# Patient Record
Sex: Male | Born: 1955 | Race: Black or African American | Hispanic: No | Smoking: Former smoker
Health system: Southern US, Community
[De-identification: ages and names within clinical notes are randomized; demographics above are authoritative.]

## PROBLEM LIST (undated history)

## (undated) DIAGNOSIS — M1990 Unspecified osteoarthritis, unspecified site: Secondary | ICD-10-CM

## (undated) DIAGNOSIS — Z22322 Carrier or suspected carrier of Methicillin resistant Staphylococcus aureus: Secondary | ICD-10-CM

## (undated) DIAGNOSIS — E785 Hyperlipidemia, unspecified: Secondary | ICD-10-CM

## (undated) DIAGNOSIS — Z8489 Family history of other specified conditions: Secondary | ICD-10-CM

## (undated) DIAGNOSIS — E215 Disorder of parathyroid gland, unspecified: Secondary | ICD-10-CM

## (undated) DIAGNOSIS — D649 Anemia, unspecified: Secondary | ICD-10-CM

## (undated) DIAGNOSIS — M109 Gout, unspecified: Secondary | ICD-10-CM

## (undated) DIAGNOSIS — Z720 Tobacco use: Secondary | ICD-10-CM

## (undated) DIAGNOSIS — F1411 Cocaine abuse, in remission: Secondary | ICD-10-CM

## (undated) DIAGNOSIS — I10 Essential (primary) hypertension: Secondary | ICD-10-CM

## (undated) DIAGNOSIS — E790 Hyperuricemia without signs of inflammatory arthritis and tophaceous disease: Secondary | ICD-10-CM

## (undated) DIAGNOSIS — A159 Respiratory tuberculosis unspecified: Secondary | ICD-10-CM

## (undated) DIAGNOSIS — N189 Chronic kidney disease, unspecified: Secondary | ICD-10-CM

## (undated) DIAGNOSIS — N186 End stage renal disease: Secondary | ICD-10-CM

## (undated) HISTORY — DX: Hyperuricemia without signs of inflammatory arthritis and tophaceous disease: E79.0

## (undated) HISTORY — DX: Hyperlipidemia, unspecified: E78.5

## (undated) HISTORY — DX: Cocaine abuse, in remission: F14.11

## (undated) HISTORY — DX: End stage renal disease: N18.6

## (undated) HISTORY — DX: Tobacco use: Z72.0

## (undated) HISTORY — PX: MANDIBLE FRACTURE SURGERY: SHX706

## (undated) HISTORY — DX: Carrier or suspected carrier of methicillin resistant Staphylococcus aureus: Z22.322

## (undated) HISTORY — DX: Anemia, unspecified: D64.9

---

## 1978-01-08 HISTORY — PX: APPENDECTOMY: SHX54

## 1983-01-09 DIAGNOSIS — A159 Respiratory tuberculosis unspecified: Secondary | ICD-10-CM

## 1983-01-09 HISTORY — DX: Respiratory tuberculosis unspecified: A15.9

## 2003-07-07 ENCOUNTER — Inpatient Hospital Stay (HOSPITAL_COMMUNITY): Admission: EM | Admit: 2003-07-07 | Discharge: 2003-07-08 | Payer: Self-pay

## 2003-12-15 ENCOUNTER — Ambulatory Visit: Payer: Self-pay | Admitting: Internal Medicine

## 2003-12-15 ENCOUNTER — Ambulatory Visit: Payer: Self-pay | Admitting: *Deleted

## 2003-12-30 ENCOUNTER — Ambulatory Visit: Payer: Self-pay | Admitting: Internal Medicine

## 2004-01-06 ENCOUNTER — Ambulatory Visit: Payer: Self-pay | Admitting: Internal Medicine

## 2004-04-06 ENCOUNTER — Ambulatory Visit: Payer: Self-pay | Admitting: Internal Medicine

## 2004-07-14 ENCOUNTER — Ambulatory Visit: Payer: Self-pay | Admitting: Internal Medicine

## 2004-07-20 ENCOUNTER — Ambulatory Visit: Payer: Self-pay | Admitting: Internal Medicine

## 2004-07-28 ENCOUNTER — Ambulatory Visit (HOSPITAL_COMMUNITY): Admission: RE | Admit: 2004-07-28 | Discharge: 2004-07-28 | Payer: Self-pay | Admitting: Internal Medicine

## 2004-08-02 ENCOUNTER — Ambulatory Visit: Payer: Self-pay | Admitting: Internal Medicine

## 2004-08-04 ENCOUNTER — Ambulatory Visit: Payer: Self-pay | Admitting: Internal Medicine

## 2004-08-16 ENCOUNTER — Ambulatory Visit: Payer: Self-pay | Admitting: Internal Medicine

## 2004-10-06 ENCOUNTER — Ambulatory Visit: Payer: Self-pay | Admitting: Internal Medicine

## 2004-10-09 ENCOUNTER — Ambulatory Visit: Payer: Self-pay | Admitting: Internal Medicine

## 2005-01-12 ENCOUNTER — Ambulatory Visit: Payer: Self-pay | Admitting: Internal Medicine

## 2005-02-09 ENCOUNTER — Ambulatory Visit: Payer: Self-pay | Admitting: Internal Medicine

## 2005-02-26 ENCOUNTER — Ambulatory Visit: Payer: Self-pay | Admitting: Internal Medicine

## 2005-02-27 ENCOUNTER — Ambulatory Visit: Payer: Self-pay | Admitting: Internal Medicine

## 2005-03-26 ENCOUNTER — Ambulatory Visit: Payer: Self-pay | Admitting: Internal Medicine

## 2005-04-17 ENCOUNTER — Ambulatory Visit: Payer: Self-pay | Admitting: Internal Medicine

## 2005-08-02 ENCOUNTER — Ambulatory Visit: Payer: Self-pay | Admitting: Internal Medicine

## 2005-09-27 ENCOUNTER — Ambulatory Visit: Payer: Self-pay | Admitting: Internal Medicine

## 2006-02-21 ENCOUNTER — Ambulatory Visit: Payer: Self-pay | Admitting: Internal Medicine

## 2006-02-22 ENCOUNTER — Encounter (INDEPENDENT_AMBULATORY_CARE_PROVIDER_SITE_OTHER): Payer: Self-pay | Admitting: Internal Medicine

## 2006-02-22 LAB — CONVERTED CEMR LAB: Microalbumin U total vol: 75.9 mg/L

## 2006-05-27 ENCOUNTER — Ambulatory Visit: Payer: Self-pay | Admitting: Internal Medicine

## 2006-08-14 ENCOUNTER — Ambulatory Visit: Payer: Self-pay | Admitting: Internal Medicine

## 2006-08-14 LAB — CONVERTED CEMR LAB
ALT: 28 units/L (ref 0–53)
AST: 20 units/L (ref 0–37)
Albumin: 4.3 g/dL (ref 3.5–5.2)
Alkaline Phosphatase: 59 units/L (ref 39–117)
BUN: 47 mg/dL — ABNORMAL HIGH (ref 6–23)
Basophils Absolute: 0 10*3/uL (ref 0.0–0.1)
Basophils Relative: 0 % (ref 0–1)
CO2: 21 meq/L (ref 19–32)
Calcium, Total (PTH): 8.7 mg/dL (ref 8.4–10.5)
Calcium: 8.7 mg/dL (ref 8.4–10.5)
Chloride: 103 meq/L (ref 96–112)
Creatinine, Ser: 3.37 mg/dL — ABNORMAL HIGH (ref 0.40–1.50)
Eosinophils Absolute: 0.1 10*3/uL (ref 0.0–0.7)
Eosinophils Relative: 2 % (ref 0–5)
Ferritin: 292 ng/mL (ref 22–322)
Glucose, Bld: 105 mg/dL — ABNORMAL HIGH (ref 70–99)
HCT: 44.9 % (ref 39.0–52.0)
Hemoglobin: 14.8 g/dL (ref 13.0–17.0)
Iron: 95 ug/dL (ref 42–165)
Lymphocytes Relative: 33 % (ref 12–46)
Lymphs Abs: 1.8 10*3/uL (ref 0.7–3.3)
MCHC: 33 g/dL (ref 30.0–36.0)
MCV: 91.3 fL (ref 78.0–100.0)
Monocytes Absolute: 0.5 10*3/uL (ref 0.2–0.7)
Monocytes Relative: 9 % (ref 3–11)
Neutro Abs: 3 10*3/uL (ref 1.7–7.7)
Neutrophils Relative %: 56 % (ref 43–77)
PTH: 261.4 pg/mL — ABNORMAL HIGH (ref 14.0–72.0)
Phosphorus: 4.8 mg/dL — ABNORMAL HIGH (ref 2.3–4.6)
Platelets: 209 10*3/uL (ref 150–400)
Potassium: 4.2 meq/L (ref 3.5–5.3)
RBC: 4.92 M/uL (ref 4.22–5.81)
RDW: 15 % — ABNORMAL HIGH (ref 11.5–14.0)
Saturation Ratios: 29 % (ref 20–55)
Sodium: 139 meq/L (ref 135–145)
TIBC: 330 ug/dL (ref 215–435)
Total Bilirubin: 0.3 mg/dL (ref 0.3–1.2)
Total Protein: 8.2 g/dL (ref 6.0–8.3)
UIBC: 235 ug/dL
WBC: 5.3 10*3/uL (ref 4.0–10.5)

## 2006-08-19 ENCOUNTER — Encounter (INDEPENDENT_AMBULATORY_CARE_PROVIDER_SITE_OTHER): Payer: Self-pay | Admitting: Internal Medicine

## 2006-08-19 DIAGNOSIS — Z9119 Patient's noncompliance with other medical treatment and regimen: Secondary | ICD-10-CM

## 2006-08-19 DIAGNOSIS — F1021 Alcohol dependence, in remission: Secondary | ICD-10-CM

## 2006-08-19 DIAGNOSIS — N2581 Secondary hyperparathyroidism of renal origin: Secondary | ICD-10-CM | POA: Insufficient documentation

## 2006-08-19 DIAGNOSIS — Z9189 Other specified personal risk factors, not elsewhere classified: Secondary | ICD-10-CM | POA: Insufficient documentation

## 2006-08-19 DIAGNOSIS — N259 Disorder resulting from impaired renal tubular function, unspecified: Secondary | ICD-10-CM | POA: Insufficient documentation

## 2006-08-19 DIAGNOSIS — I10 Essential (primary) hypertension: Secondary | ICD-10-CM

## 2006-09-25 ENCOUNTER — Encounter (INDEPENDENT_AMBULATORY_CARE_PROVIDER_SITE_OTHER): Payer: Self-pay | Admitting: *Deleted

## 2006-10-01 ENCOUNTER — Ambulatory Visit: Payer: Self-pay | Admitting: Internal Medicine

## 2006-10-01 LAB — CONVERTED CEMR LAB
Albumin: 4.1 g/dL (ref 3.5–5.2)
BUN: 45 mg/dL — ABNORMAL HIGH (ref 6–23)
CO2: 24 meq/L (ref 19–32)
Calcium, Total (PTH): 9.4 mg/dL (ref 8.4–10.5)
Calcium: 9.4 mg/dL (ref 8.4–10.5)
Chloride: 102 meq/L (ref 96–112)
Creatinine, Ser: 2.92 mg/dL — ABNORMAL HIGH (ref 0.40–1.50)
Glucose, Bld: 107 mg/dL — ABNORMAL HIGH (ref 70–99)
PTH: 52.6 pg/mL (ref 14.0–72.0)
Phosphorus: 4.4 mg/dL (ref 2.3–4.6)
Potassium: 4.4 meq/L (ref 3.5–5.3)
Sodium: 138 meq/L (ref 135–145)

## 2007-06-04 ENCOUNTER — Emergency Department (HOSPITAL_COMMUNITY): Admission: EM | Admit: 2007-06-04 | Discharge: 2007-06-04 | Payer: Self-pay | Admitting: Family Medicine

## 2007-06-06 ENCOUNTER — Ambulatory Visit: Payer: Self-pay | Admitting: Internal Medicine

## 2007-06-06 LAB — CONVERTED CEMR LAB
ALT: 21 units/L (ref 0–53)
AST: 18 units/L (ref 0–37)
Albumin: 3.6 g/dL (ref 3.5–5.2)
Alkaline Phosphatase: 62 units/L (ref 39–117)
BUN: 52 mg/dL — ABNORMAL HIGH (ref 6–23)
Basophils Absolute: 0 10*3/uL (ref 0.0–0.1)
Basophils Relative: 1 % (ref 0–1)
CO2: 21 meq/L (ref 19–32)
Calcium, Total (PTH): 8.6 mg/dL (ref 8.4–10.5)
Calcium: 8.6 mg/dL (ref 8.4–10.5)
Chloride: 106 meq/L (ref 96–112)
Creatinine, Ser: 2.98 mg/dL — ABNORMAL HIGH (ref 0.40–1.50)
Eosinophils Absolute: 0.1 10*3/uL (ref 0.0–0.7)
Eosinophils Relative: 2 % (ref 0–5)
Glucose, Bld: 111 mg/dL — ABNORMAL HIGH (ref 70–99)
HCT: 37.8 % — ABNORMAL LOW (ref 39.0–52.0)
Hemoglobin: 12 g/dL — ABNORMAL LOW (ref 13.0–17.0)
Lymphocytes Relative: 27 % (ref 12–46)
Lymphs Abs: 1.5 10*3/uL (ref 0.7–4.0)
MCHC: 31.7 g/dL (ref 30.0–36.0)
MCV: 93.6 fL (ref 78.0–100.0)
Monocytes Absolute: 0.4 10*3/uL (ref 0.1–1.0)
Monocytes Relative: 8 % (ref 3–12)
Neutro Abs: 3.5 10*3/uL (ref 1.7–7.7)
Neutrophils Relative %: 63 % (ref 43–77)
PTH: 48.3 pg/mL (ref 14.0–72.0)
Platelets: 314 10*3/uL (ref 150–400)
Potassium: 4.6 meq/L (ref 3.5–5.3)
RBC: 4.04 M/uL — ABNORMAL LOW (ref 4.22–5.81)
RDW: 14.9 % (ref 11.5–15.5)
Sodium: 139 meq/L (ref 135–145)
Total Bilirubin: 0.2 mg/dL — ABNORMAL LOW (ref 0.3–1.2)
Total Protein: 7.6 g/dL (ref 6.0–8.3)
WBC: 5.5 10*3/uL (ref 4.0–10.5)

## 2007-12-03 ENCOUNTER — Ambulatory Visit: Payer: Self-pay | Admitting: Internal Medicine

## 2007-12-03 LAB — CONVERTED CEMR LAB
ALT: 26 units/L (ref 0–53)
AST: 17 units/L (ref 0–37)
Albumin: 4.1 g/dL (ref 3.5–5.2)
Alkaline Phosphatase: 52 units/L (ref 39–117)
BUN: 56 mg/dL — ABNORMAL HIGH (ref 6–23)
Basophils Absolute: 0 10*3/uL (ref 0.0–0.1)
Basophils Relative: 0 % (ref 0–1)
CO2: 19 meq/L (ref 19–32)
Calcium: 8.6 mg/dL (ref 8.4–10.5)
Chloride: 107 meq/L (ref 96–112)
Cholesterol: 183 mg/dL (ref 0–200)
Creatinine, Ser: 3.76 mg/dL — ABNORMAL HIGH (ref 0.40–1.50)
Eosinophils Absolute: 0.3 10*3/uL (ref 0.0–0.7)
Eosinophils Relative: 4 % (ref 0–5)
Glucose, Bld: 87 mg/dL (ref 70–99)
HCT: 37.3 % — ABNORMAL LOW (ref 39.0–52.0)
HDL: 49 mg/dL (ref >39)
Hemoglobin: 12.2 g/dL — ABNORMAL LOW (ref 13.0–17.0)
Lymphocytes Relative: 23 % (ref 12–46)
Lymphs Abs: 1.3 10*3/uL (ref 0.7–4.0)
MCHC: 32.7 g/dL (ref 30.0–36.0)
MCV: 89.9 fL (ref 78.0–100.0)
Monocytes Absolute: 0.5 10*3/uL (ref 0.1–1.0)
Monocytes Relative: 8 % (ref 3–12)
Neutro Abs: 3.8 10*3/uL (ref 1.7–7.7)
Neutrophils Relative %: 65 % (ref 43–77)
Platelets: 210 10*3/uL (ref 150–400)
Potassium: 5 meq/L (ref 3.5–5.3)
RBC: 4.15 M/uL — ABNORMAL LOW (ref 4.22–5.81)
RDW: 13.5 % (ref 11.5–15.5)
Sodium: 136 meq/L (ref 135–145)
TSH: 0.757 microintl units/mL (ref 0.350–4.50)
Total Bilirubin: 0.2 mg/dL — ABNORMAL LOW (ref 0.3–1.2)
Total CHOL/HDL Ratio: 3.7
Total Protein: 7.3 g/dL (ref 6.0–8.3)
Triglycerides: 543 mg/dL — ABNORMAL HIGH (ref <150)
WBC: 6 10*3/uL (ref 4.0–10.5)

## 2008-01-09 HISTORY — PX: AV FISTULA PLACEMENT: SHX1204

## 2008-09-14 ENCOUNTER — Ambulatory Visit: Payer: Self-pay | Admitting: Internal Medicine

## 2008-09-14 LAB — CONVERTED CEMR LAB: Microalb, Ur: 144.68 mg/dL — ABNORMAL HIGH (ref 0.00–1.89)

## 2008-09-15 ENCOUNTER — Encounter (INDEPENDENT_AMBULATORY_CARE_PROVIDER_SITE_OTHER): Payer: Self-pay | Admitting: Internal Medicine

## 2008-09-15 LAB — CONVERTED CEMR LAB
ALT: 22 units/L (ref 0–53)
AST: 24 units/L (ref 0–37)
Albumin: 3.8 g/dL (ref 3.5–5.2)
Alkaline Phosphatase: 66 units/L (ref 39–117)
BUN: 72 mg/dL — ABNORMAL HIGH (ref 6–23)
Basophils Absolute: 0 10*3/uL (ref 0.0–0.1)
Basophils Relative: 1 % (ref 0–1)
CO2: 14 meq/L — ABNORMAL LOW (ref 19–32)
Calcium, Total (PTH): 8.2 mg/dL — ABNORMAL LOW (ref 8.4–10.5)
Calcium: 8.2 mg/dL — ABNORMAL LOW (ref 8.4–10.5)
Chloride: 109 meq/L (ref 96–112)
Creatinine, Ser: 5.76 mg/dL — ABNORMAL HIGH (ref 0.40–1.50)
Eosinophils Absolute: 0.2 10*3/uL (ref 0.0–0.7)
Eosinophils Relative: 3 % (ref 0–5)
Glucose, Bld: 88 mg/dL (ref 70–99)
HCT: 39.1 % (ref 39.0–52.0)
Hemoglobin: 13 g/dL (ref 13.0–17.0)
Lymphocytes Relative: 24 % (ref 12–46)
Lymphs Abs: 1.6 10*3/uL (ref 0.7–4.0)
MCHC: 33.2 g/dL (ref 30.0–36.0)
MCV: 92 fL (ref 78.0–100.0)
Monocytes Absolute: 0.6 10*3/uL (ref 0.1–1.0)
Monocytes Relative: 9 % (ref 3–12)
Neutro Abs: 4.1 10*3/uL (ref 1.7–7.7)
Neutrophils Relative %: 64 % (ref 43–77)
PTH: 388.7 pg/mL — ABNORMAL HIGH (ref 14.0–72.0)
Phosphorus: 5.4 mg/dL — ABNORMAL HIGH (ref 2.3–4.6)
Platelets: 204 10*3/uL (ref 150–400)
Potassium: 4.9 meq/L (ref 3.5–5.3)
RBC: 4.25 M/uL (ref 4.22–5.81)
RDW: 14.1 % (ref 11.5–15.5)
Sodium: 139 meq/L (ref 135–145)
Total Bilirubin: 0.2 mg/dL — ABNORMAL LOW (ref 0.3–1.2)
Total Protein: 7.3 g/dL (ref 6.0–8.3)
WBC: 6.5 10*3/uL (ref 4.0–10.5)

## 2008-11-09 ENCOUNTER — Ambulatory Visit: Payer: Self-pay | Admitting: Vascular Surgery

## 2008-11-17 ENCOUNTER — Ambulatory Visit (HOSPITAL_COMMUNITY): Admission: RE | Admit: 2008-11-17 | Discharge: 2008-11-17 | Payer: Self-pay | Admitting: Vascular Surgery

## 2008-11-17 ENCOUNTER — Ambulatory Visit: Payer: Self-pay | Admitting: Vascular Surgery

## 2008-12-01 ENCOUNTER — Ambulatory Visit: Payer: Self-pay | Admitting: Internal Medicine

## 2008-12-27 ENCOUNTER — Ambulatory Visit: Payer: Self-pay | Admitting: Vascular Surgery

## 2008-12-28 ENCOUNTER — Ambulatory Visit: Payer: Self-pay | Admitting: Internal Medicine

## 2008-12-28 ENCOUNTER — Ambulatory Visit: Payer: Self-pay | Admitting: Vascular Surgery

## 2009-02-03 ENCOUNTER — Ambulatory Visit: Payer: Self-pay | Admitting: Internal Medicine

## 2009-03-10 ENCOUNTER — Ambulatory Visit: Payer: Self-pay | Admitting: Internal Medicine

## 2009-03-11 ENCOUNTER — Ambulatory Visit: Payer: Self-pay | Admitting: Internal Medicine

## 2009-05-05 ENCOUNTER — Ambulatory Visit: Payer: Self-pay | Admitting: Internal Medicine

## 2009-05-05 LAB — CONVERTED CEMR LAB
ALT: 16 units/L (ref 0–53)
AST: 17 units/L (ref 0–37)
Albumin: 4.4 g/dL (ref 3.5–5.2)
Alkaline Phosphatase: 41 units/L (ref 39–117)
BUN: 96 mg/dL — ABNORMAL HIGH (ref 6–23)
Basophils Absolute: 0 10*3/uL (ref 0.0–0.1)
Basophils Relative: 1 % (ref 0–1)
CO2: 21 meq/L (ref 19–32)
Calcium: 11.6 mg/dL — ABNORMAL HIGH (ref 8.4–10.5)
Chloride: 98 meq/L (ref 96–112)
Cholesterol: 174 mg/dL (ref 0–200)
Creatinine, Ser: 6.78 mg/dL — ABNORMAL HIGH (ref 0.40–1.50)
Eosinophils Absolute: 0.2 10*3/uL (ref 0.0–0.7)
Eosinophils Relative: 3 % (ref 0–5)
Glucose, Bld: 103 mg/dL — ABNORMAL HIGH (ref 70–99)
HCT: 39 % (ref 39.0–52.0)
HDL: 57 mg/dL (ref >39)
Hemoglobin: 13 g/dL (ref 13.0–17.0)
LDL Cholesterol: 74 mg/dL (ref 0–99)
Lymphocytes Relative: 24 % (ref 12–46)
Lymphs Abs: 1.5 10*3/uL (ref 0.7–4.0)
MCHC: 33.3 g/dL (ref 30.0–36.0)
MCV: 87.8 fL (ref 78.0–100.0)
Monocytes Absolute: 0.6 10*3/uL (ref 0.1–1.0)
Monocytes Relative: 10 % (ref 3–12)
Neutro Abs: 3.9 10*3/uL (ref 1.7–7.7)
Neutrophils Relative %: 63 % (ref 43–77)
Phosphorus: 6.7 mg/dL — ABNORMAL HIGH (ref 2.3–4.6)
Platelets: 204 10*3/uL (ref 150–400)
Potassium: 4.4 meq/L (ref 3.5–5.3)
RBC: 4.44 M/uL (ref 4.22–5.81)
RDW: 13.6 % (ref 11.5–15.5)
Sodium: 136 meq/L (ref 135–145)
Total Bilirubin: 0.4 mg/dL (ref 0.3–1.2)
Total CHOL/HDL Ratio: 3.1
Total Protein: 8.2 g/dL (ref 6.0–8.3)
Triglycerides: 213 mg/dL — ABNORMAL HIGH (ref <150)
VLDL: 43 mg/dL — ABNORMAL HIGH (ref 0–40)
WBC: 6.2 10*3/uL (ref 4.0–10.5)

## 2009-07-25 ENCOUNTER — Ambulatory Visit: Payer: Self-pay | Admitting: Vascular Surgery

## 2009-11-18 ENCOUNTER — Emergency Department (HOSPITAL_COMMUNITY): Admission: EM | Admit: 2009-11-18 | Discharge: 2009-11-18 | Payer: Self-pay | Admitting: Family Medicine

## 2010-01-10 ENCOUNTER — Emergency Department (HOSPITAL_COMMUNITY)
Admission: EM | Admit: 2010-01-10 | Discharge: 2010-01-10 | Payer: Self-pay | Source: Home / Self Care | Admitting: Emergency Medicine

## 2010-03-20 LAB — POCT URINALYSIS DIPSTICK
Bilirubin Urine: NEGATIVE
Glucose, UA: 100 mg/dL — AB
Ketones, ur: NEGATIVE mg/dL
Nitrite: NEGATIVE
Protein, ur: >=300 mg/dL — AB
Specific Gravity, Urine: 1.02 (ref 1.005–1.030)
Urobilinogen, UA: 0.2 mg/dL (ref 0.0–1.0)
pH: 6.5 (ref 5.0–8.0)

## 2010-03-20 LAB — GC/CHLAMYDIA PROBE AMP, GENITAL
Chlamydia, DNA Probe: NEGATIVE
GC Probe Amp, Genital: NEGATIVE

## 2010-03-20 LAB — HIV ANTIBODY (ROUTINE TESTING W REFLEX): HIV: NONREACTIVE

## 2010-03-20 LAB — HEPATITIS B SURFACE ANTIGEN: Hepatitis B Surface Ag: NEGATIVE

## 2010-03-21 LAB — GC/CHLAMYDIA PROBE AMP, GENITAL
Chlamydia, DNA Probe: NEGATIVE
GC Probe Amp, Genital: POSITIVE — AB

## 2010-04-12 LAB — POCT I-STAT 4, (NA,K, GLUC, HGB,HCT)
Glucose, Bld: 106 mg/dL — ABNORMAL HIGH (ref 70–99)
HCT: 44 % (ref 39.0–52.0)
Hemoglobin: 15 g/dL (ref 13.0–17.0)
Potassium: 4.3 mEq/L (ref 3.5–5.1)
Sodium: 143 mEq/L (ref 135–145)

## 2010-05-23 NOTE — Assessment & Plan Note (Signed)
OFFICE VISIT   HORICE, BORSETH  DOB:  [birth year]                                       12/28/2008  [id number]   The patient returns 6 weeks post creation of a left upper arm AV fistula  for hemodialysis.  He has not yet had hemodialysis but is approaching  it.  He has had no evidence of steal in the left hand.  Incision has  healed nicely and on exam he has an excellent pulse and a palpable  thrill up to the shoulder level.   I think the fistula is functioning well and should be a good source for  hemodialysis when and if that becomes necessary.  He will return to see  [place] on a p.r.n. basis.     Nelda Severe Kellie Simmering, M.D.  Electronically Signed   JDL/MEDQ  D:  12/28/2008  T:  12/29/2008  Job:  [id number]

## 2010-05-23 NOTE — Procedures (Signed)
CEPHALIC VEIN MAPPING   INDICATION:  Preop AVF cephalic vein mapping.   HISTORY:  Chronic kidney disease.   EXAM:   The right cephalic vein is compressible.   Diameter measurements range from 0.30 cm to 0.67 cm; however, 0.42 cm to  0.67 cm in the brachium.   The left cephalic vein is compressible.   Diameter measurements range from 0.21 cm to 0.57 cm; however, 0.48 cm to  0.57 cm in the brachium.   See attached worksheet for all measurements.   IMPRESSION:  Patent bilateral cephalic veins, which are of acceptable  diameter for use as a dialysis access site in the right upper extremity  and left brachium.   ___________________________________________  Nelda Severe Kellie Simmering, M.D.   AS/MEDQ  D:  11/09/2008  T:  11/09/2008  Job:  [id number]

## 2010-05-23 NOTE — Consult Note (Signed)
NEW PATIENT CONSULTATION   Christopher Chapman, Christopher Chapman  DOB:  1955/12/14                                       11/09/2008  [id number]   The patient is 55 years old with end-stage renal disease not yet on  hemodialysis referred for vascular access.  He is right handed.  He has  been followed by Dr. Lorrene Reid with progressive renal insufficiency with  his most recent creatinine being 5.7 with a BUN of 72.  He is thought to  have chronic glomerular nephritis as a cause for his end-stage renal  disease.   CHRONIC PROBLEMS:  Which are stable:  1. Hypertension.  2. Gout.  3. Chronic glomerular nephritis.  4. Secondary hyperparathyroidism.   Today I have reviewed his old medical records provided by Dr. Lorrene Reid as  well as his previous lab work.  I ordered vein mapping procedure which I  reviewed and interpreted.   PAST SURGICAL HISTORY:  Includes appendectomy 1980.   FAMILY HISTORY:  Positive for end-stage renal disease in a sister.   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  Smokes about 8 cigarettes per  day and drinks two beers per day.   REVIEW OF SYSTEMS:  The patient does have occasional dyspnea on  exertion, but no chest pain.  Also occasional diarrhea, urinary  frequency, dizziness, depression, anxiety.  All other review of systems  are negative.  Please see encounter form.   ALLERGIES:  None known.   PHYSICAL EXAM:  Blood pressure 129/87, heart rate 60, respirations 18.  General:  He is a middle-aged male in no apparent distress, alert and  oriented x3.  HEENT exam unremarkable.  Neck:  Supple 3+ carotid pulses  palpable.  No bruits are audible.  Neurologic:  Normal with no palpable  adenopathy in the neck.  Chest:  Clear to auscultation.  Cardiovascular:  Regular rhythm.  No murmurs.  Abdomen:  Soft, nontender with no masses.  He has 3+ femoral pulses bilaterally.  Well-perfused lower extremities.  Upper extremity exam reveals 3+ brachial and 2+ radial  pulses.  Cephalic  vein on the right is larger than the left in the forearm which is  confirmed by vein mapping.  Left forearm cephalic vein is quite marginal  in the lower half of the arm and also has a double trunk.  Both upper  arm cephalic veins appear adequate for fistula creation and the right  forearm vein is borderline adequate.   I discussed this with him and he would like to proceed with left upper  arm AV fistula as the initial access to stay away from his dominant  right arm.  Schedule that for Wednesday, November 10 at [facility]  and hopefully this will provide satisfactory vascular access for this  nice man.   Nelda Severe Kellie Simmering, M.D.  Electronically Signed   JDL/MEDQ  D:  11/09/2008  T:  11/10/2008  Job:  [id number]
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
## 2010-10-04 LAB — CULTURE, ROUTINE-ABSCESS

## 2011-01-19 ENCOUNTER — Emergency Department (HOSPITAL_COMMUNITY)
Admission: EM | Admit: 2011-01-19 | Discharge: 2011-01-19 | Disposition: A | Discharge status: Home / Self Care | Payer: Medicaid Other | Source: Home / Self Care | Attending: Emergency Medicine | Admitting: Emergency Medicine

## 2011-01-19 ENCOUNTER — Encounter (HOSPITAL_COMMUNITY): Payer: Self-pay

## 2011-01-19 DIAGNOSIS — N508 Other specified disorders of male genital organs: Secondary | ICD-10-CM

## 2011-01-19 DIAGNOSIS — H60399 Other infective otitis externa, unspecified ear: Secondary | ICD-10-CM

## 2011-01-19 DIAGNOSIS — N5089 Other specified disorders of the male genital organs: Secondary | ICD-10-CM

## 2011-01-19 DIAGNOSIS — H60391 Other infective otitis externa, right ear: Secondary | ICD-10-CM

## 2011-01-19 HISTORY — DX: Chronic kidney disease, unspecified: N18.9

## 2011-01-19 HISTORY — DX: Essential (primary) hypertension: I10

## 2011-01-19 LAB — RPR: RPR Ser Ql: NONREACTIVE

## 2011-01-19 MED ORDER — DOXYCYCLINE HYCLATE 100 MG PO CAPS: 100.0000 mg | ORAL_CAPSULE | Freq: Two times a day (BID) | ORAL | Status: AC

## 2011-01-19 MED ORDER — CIPROFLOXACIN-DEXAMETHASONE 0.3-0.1 % OT SUSP: 4.0000 [drp] | Freq: Two times a day (BID) | OTIC | Status: AC

## 2011-01-19 NOTE — ED Provider Notes (Addendum)
History     CSN: [id number]  Arrival date & time 01/19/11  [id number]   First MD Initiated Contact with Patient 01/19/11 1011      Chief Complaint  Patient presents with  . Otalgia    (Consider location/radiation/quality/duration/timing/severity/associated sxs/prior treatment) The history is provided by the patient.    Past Medical History  Diagnosis Date  . CRF (chronic renal failure)   . Hypertension     Past Surgical History  Procedure Date  . Appendectomy   . Av fistula placement     No family history on file.  History  Substance Use Topics  . Smoking status: Current Everyday Smoker  . Smokeless tobacco: Not on file  . Alcohol Use: Yes      Review of Systems  Constitutional: Negative for fever.  HENT: Positive for ear pain. Negative for hearing loss and ear discharge.   Skin: Positive for rash.  Neurological: Negative for headaches.    Allergies  Review of patient's allergies indicates no known allergies.  Home Medications   Current Outpatient Rx  Name Route Sig Dispense Refill  . ATENOLOL 50 MG PO TABS Oral Take 100 mg by mouth daily.    Marland Kitchen CALCIUM 500/D PO Oral Take by mouth.    . CLONIDINE HCL 0.2 MG PO TABS Oral Take 0.2 mg by mouth 3 (three) times daily.    . COLCHICINE 0.6 MG PO TABS Oral Take 0.6 mg by mouth daily.    . FUROSEMIDE 40 MG PO TABS Oral Take 40 mg by mouth daily.    Marland Kitchen SIMVASTATIN 20 MG PO TABS Oral Take 20 mg by mouth every evening.    Marland Kitchen SODIUM BICARBONATE 650 MG PO TABS Oral Take 650 mg by mouth 3 (three) times daily.    Marland Kitchen CIPROFLOXACIN-DEXAMETHASONE 0.3-0.1 % OT SUSP Right Ear Place 4 drops into the right ear 2 (two) times daily. 7.5 mL 0  . DOXYCYCLINE HYCLATE 100 MG PO CAPS Oral Take 1 capsule (100 mg total) by mouth 2 (two) times daily. 20 capsule 0    BP 170/85  Pulse 67  Temp(Src) 98 F (36.7 C) (Oral)  Resp 18  SpO2 98%  Physical Exam  Nursing note and vitals reviewed. Constitutional: He appears well-nourished. No  distress.  HENT:  Head: Atraumatic.  Right Ear: There is swelling and tenderness. No drainage. No foreign bodies. No decreased hearing is noted.  Ears:  Eyes: Conjunctivae are normal.  Neck: Neck supple.  Genitourinary:    Cremasteric reflex is present. Penile erythema present. No phimosis or paraphimosis. No discharge found.  Lymphadenopathy:    He has no cervical adenopathy.  Skin: There is erythema.    ED Course  Procedures (including critical care time)   Labs Reviewed  GC/CHLAMYDIA PROBE AMP, GENITAL  RPR  HIV ANTIBODY (ROUTINE TESTING)   No results found.   1. Infection of right external ear   2. Genital lesion, male       MDM  External R ear canal furuncular lesion, also described, resolving scrotal lesion fading away, it looked initially like a "sore". (RPR ordered)        Rosana Hoes, MD 01/19/11 [place], MD 01/19/11 1520

## 2011-01-19 NOTE — ED Notes (Signed)
C/o external swelling to rt ear for  Days, states now having pain in rt jaw and neck

## 2011-01-20 LAB — GC/CHLAMYDIA PROBE AMP, GENITAL
Chlamydia, DNA Probe: NEGATIVE
GC Probe Amp, Genital: NEGATIVE

## 2011-06-08 ENCOUNTER — Encounter (HOSPITAL_COMMUNITY): Payer: Self-pay | Admitting: *Deleted

## 2011-06-08 ENCOUNTER — Emergency Department (HOSPITAL_COMMUNITY)
Admission: EM | Admit: 2011-06-08 | Discharge: 2011-06-08 | Disposition: A | Discharge status: Home / Self Care | Payer: Medicaid Other | Attending: Emergency Medicine | Admitting: Emergency Medicine

## 2011-06-08 DIAGNOSIS — I509 Heart failure, unspecified: Secondary | ICD-10-CM | POA: Insufficient documentation

## 2011-06-08 DIAGNOSIS — F17200 Nicotine dependence, unspecified, uncomplicated: Secondary | ICD-10-CM | POA: Insufficient documentation

## 2011-06-08 DIAGNOSIS — H109 Unspecified conjunctivitis: Secondary | ICD-10-CM

## 2011-06-08 DIAGNOSIS — H5789 Other specified disorders of eye and adnexa: Secondary | ICD-10-CM | POA: Insufficient documentation

## 2011-06-08 DIAGNOSIS — S0500XA Injury of conjunctiva and corneal abrasion without foreign body, unspecified eye, initial encounter: Secondary | ICD-10-CM

## 2011-06-08 DIAGNOSIS — I10 Essential (primary) hypertension: Secondary | ICD-10-CM | POA: Insufficient documentation

## 2011-06-08 MED ORDER — TETRACAINE HCL 0.5 % OP SOLN
1.0000 [drp] | Freq: Once | OPHTHALMIC | Status: AC
  Administered 2011-06-08: 1 [drp] via OPHTHALMIC

## 2011-06-08 MED ORDER — ERYTHROMYCIN 5 MG/GM OP OINT
TOPICAL_OINTMENT | Freq: Once | OPHTHALMIC | Status: AC
  Administered 2011-06-08: 21:00:00 via OPHTHALMIC
  Filled 2011-06-08: qty 1

## 2011-06-08 MED ORDER — FLUORESCEIN SODIUM 1 MG OP STRP
1.0000 | ORAL_STRIP | Freq: Once | OPHTHALMIC | Status: AC
  Administered 2011-06-08: 1 via OPHTHALMIC

## 2011-06-08 MED ORDER — FLUORESCEIN SODIUM 1 MG OP STRP
ORAL_STRIP | OPHTHALMIC | Status: AC
  Filled 2011-06-08: qty 1

## 2011-06-08 NOTE — ED Provider Notes (Signed)
History     CSN: [id number]  Arrival date & time 06/08/11  [id number]   First MD Initiated Contact with Patient 06/08/11 1951      Chief Complaint  Patient presents with  . Eye Pain    Swelling and drainage to L eye    (Consider location/radiation/quality/duration/timing/severity/associated sxs/prior treatment) HPI Comments: Patient presents with approximately 3 days of left eye symptoms.  Patient notes that initially his right eye seemed to be somewhat red and irritated and then his left side beginning of similar symptoms.  The right eye has resolved.  The left eye has some mild swelling of the upper lid and the patient feels like something is in his eye.  He does not wear contacts.  He has noted that due to the continued irritation he has been rubbing at his eye.  He has had some mild discharge from that eye as well.  No other cold symptoms or fevers.  Patient is a 56 y.o. male presenting with eye pain. The history is provided by the patient. No language interpreter was used.  Eye Pain This is a new problem. The current episode started more than 2 days ago. The problem occurs constantly. The problem has not changed since onset.Pertinent negatives include no chest pain, no abdominal pain, no headaches and no shortness of breath.    Past Medical History  Diagnosis Date  . CRF (chronic renal failure)   . Hypertension     Past Surgical History  Procedure Date  . Appendectomy   . Av fistula placement     History reviewed. No pertinent family history.  History  Substance Use Topics  . Smoking status: Current Everyday Smoker  . Smokeless tobacco: Not on file  . Alcohol Use: Yes      Review of Systems  Constitutional: Negative.  Negative for fever and chills.  HENT: Negative.   Eyes: Positive for pain, discharge and redness.  Respiratory: Negative.  Negative for cough and shortness of breath.   Cardiovascular: Negative.  Negative for chest pain.  Gastrointestinal: Negative.   Negative for nausea, vomiting and abdominal pain.  Genitourinary: Negative.  Negative for hematuria.  Musculoskeletal: Negative.  Negative for back pain.  Skin: Negative.  Negative for color change and rash.  Neurological: Negative for syncope and headaches.  Hematological: Negative.  Negative for adenopathy.  Psychiatric/Behavioral: Negative.  Negative for confusion.  All other systems reviewed and are negative.    Allergies  Review of patient's allergies indicates no known allergies.  Home Medications   Current Outpatient Rx  Name Route Sig Dispense Refill  . ATENOLOL 50 MG PO TABS Oral Take 100 mg by mouth daily.    Marland Kitchen CALCIUM 500/D PO Oral Take 2 tablets by mouth 3 (three) times daily.     Marland Kitchen CLONIDINE HCL 0.2 MG PO TABS Oral Take 0.2 mg by mouth 3 (three) times daily.    . COLCHICINE 0.6 MG PO TABS Oral Take 0.6 mg by mouth daily.    Marland Kitchen DOXAZOSIN MESYLATE 8 MG PO TABS Oral Take 8 mg by mouth at bedtime.    . FUROSEMIDE 40 MG PO TABS Oral Take 40 mg by mouth daily.    Marland Kitchen SIMVASTATIN 20 MG PO TABS Oral Take 20 mg by mouth every evening.    Marland Kitchen SODIUM BICARBONATE 650 MG PO TABS Oral Take 650 mg by mouth 3 (three) times daily.      BP 138/80  Pulse 64  Temp(Src) 98.1 F (36.7 C) (Oral)  Resp 16  SpO2 99%  Physical Exam  Nursing note and vitals reviewed. Constitutional: He is oriented to person, place, and time. He appears well-developed and well-nourished.  Non-toxic appearance. He does not have a sickly appearance.  HENT:  Head: Normocephalic and atraumatic.  Eyes: EOM and lids are normal. Pupils are equal, round, and reactive to light. Left eye exhibits discharge.       Left eye has conjunctival injection.  Small amount of discharge is present at the medial corner of the left eye.  On flouroscein staining there is an area of uptake in the 11:00 position.  There is also mild upper eyelid swelling without significant warmth or erythema.  Neck: Trachea normal, normal range of  motion and full passive range of motion without pain. Neck supple.  Cardiovascular: Normal rate.   Pulmonary/Chest: Effort normal. No respiratory distress.  Abdominal: Normal appearance. There is no CVA tenderness.  Musculoskeletal: Normal range of motion.  Neurological: He is alert and oriented to person, place, and time. He has normal strength.  Skin: Skin is warm, dry and intact. No rash noted.  Psychiatric: He has a normal mood and affect. His behavior is normal. Judgment and thought content normal.    ED Course  Procedures (including critical care time)  Labs Reviewed - No data to display No results found.   No diagnosis found.    MDM  Patient with likely initial conjunctivitis that lead to irritation of the eye.  The patients continued rubbing of the eye likely caused the corneal abrasion present on his physical exam today.  Patient will be placed on erythromycin ophthalmic ointment 4 times a day for the next 5 days for treatment of this process.  Patient has been cautioned that if he is not noticing significant improvement in 2-3 days that he should return for further evaluation or followup at the [facility] where he has been seen previously since he wears glasses.        Lezlie Octave, MD 06/08/11 2028

## 2011-06-08 NOTE — Discharge Instructions (Signed)
Please place the erythromycin ophthalmic ointment in your eye 4 times per day, approximately every 6 hours.  You should do this for 5 days.  If your symptoms are not improving in 2-3 days you should be reevaluated.  Conjunctivitis Conjunctivitis is commonly called "pink eye." Conjunctivitis can be caused by bacterial or viral infection, allergies, or injuries. There is usually redness of the lining of the eye, itching, discomfort, and sometimes discharge. There may be deposits of matter along the eyelids. A viral infection usually causes a watery discharge, while a bacterial infection causes a yellowish, thick discharge. Pink eye is very contagious and spreads by direct contact. You may be given antibiotic eyedrops as part of your treatment. Before using your eye medicine, remove all drainage from the eye by washing gently with warm water and cotton balls. Continue to use the medication until you have awakened 2 mornings in a row without discharge from the eye. Do not rub your eye. This increases the irritation and helps spread infection. Use separate towels from other household members. Wash your hands with soap and water before and after touching your eyes. Use cold compresses to reduce pain and sunglasses to relieve irritation from light. Do not wear contact lenses or wear eye makeup until the infection is gone. SEEK MEDICAL CARE IF:   Your symptoms are not better after 3 days of treatment.   You have increased pain or trouble seeing.   The outer eyelids become very red or swollen.  Document Released: 02/02/2004 Document Revised: 12/14/2010 Document Reviewed: 12/25/2004 [facility] Patient Information 2012 [place].Corneal Abrasion The cornea is the clear covering at the front and center of the eye. When looking at the colored portion (iris) of the eye, you are looking through that person's cornea.  This very thin tissue is made up of many layers. The surface layer is a single layer of cells  called the corneal epithelium. This is one of the most sensitive tissues in the body. If a scratch or injury causes the corneal epithelium to come off, it is called a corneal abrasion. If the injury extends to the tissues below the epithelium, the condition is called a corneal ulcer.  CAUSES   Scratches.   Trauma.   Foreign body in the eye.   Some people have recurrences of abrasions in the area of the original injury even after they heal. This is called recurrent erosion syndrome. Recurrent erosion syndromes generally improve and go away with time.  SYMPTOMS   Eye pain.   Difficulty or inability to keep the injured eye open.   The eye becomes very sensitive to light.   Recurrent erosions tend to happen suddenly, first thing in the morning - usually upon awakening and opening the eyes.  DIAGNOSIS  Your eye professional can diagnose a corneal abrasion during an eye exam. Dye is usually placed in the eye using a drop or a small paper strip moistened by the patient's tears. When the eye is examined with a special light, the abrasion shows up clearly because of the dye. TREATMENT   Small abrasions may be treated with antibiotic drops or ointment alone.   Usually a pressure patch is specially applied. Pressure patches prevent the eye from blinking, allowing the corneal epithelium to heal. Because blinking is less, a pressure patch also reduces the amount of pain present in the eye during healing. Most corneal abrasions heal within 2-3 days with no effect on vision. WARNING: Do not drive or operate machinery while your  eye is patched. Your ability to judge distances is impaired.   If abrasion becomes infected and spreads to the deeper tissues of the cornea, a corneal ulcer can result. This is serious because it can cause corneal scarring. Corneal scars interfere with light passing through the cornea, and cause a loss of vision in the involved eye.   If your caregiver has given you a follow-up  appointment, it is very important to keep that appointment. Not keeping the appointment could result in a severe eye infection or permanent loss of vision. If there is any problem keeping the appointment, you must call back to this facility for assistance.  SEEK MEDICAL CARE IF:   You have pain, light sensitivity and a scratchy feeling in one eye (or both).   Your pressure patch keeps loosening up and you can blink your eye under the patch after treatment.   Any kind of discharge develops from the involved eye after treatment or if the lids stick together in the morning.   You have the same symptoms in the morning as you did with the original abrasion days, weeks or months after the abrasion healed.  MAKE SURE YOU:   Understand these instructions.   Will watch your condition.   Will get help right away if you are not doing well or get worse.  Document Released: 12/23/1999 Document Revised: 12/14/2010 Document Reviewed: 07/31/2007 [facility] Patient Information 2012 [place].

## 2011-06-08 NOTE — ED Notes (Signed)
Pt states pain and "film" to L eye x 3 days.  Feels like something is "in" eye.  L upper eyelid swollen.

## 2011-06-08 NOTE — ED Notes (Signed)
Patient with irritation in left eye with drainage for last three days.

## 2011-06-26 ENCOUNTER — Encounter: Payer: Self-pay | Admitting: Vascular Surgery

## 2011-06-26 ENCOUNTER — Other Ambulatory Visit: Payer: Self-pay

## 2011-06-26 DIAGNOSIS — T82898A Other specified complication of vascular prosthetic devices, implants and grafts, initial encounter: Secondary | ICD-10-CM

## 2011-06-26 DIAGNOSIS — N186 End stage renal disease: Secondary | ICD-10-CM

## 2011-06-27 ENCOUNTER — Ambulatory Visit (INDEPENDENT_AMBULATORY_CARE_PROVIDER_SITE_OTHER): Payer: Medicaid Other | Admitting: Vascular Surgery

## 2011-06-27 ENCOUNTER — Encounter: Payer: Self-pay | Admitting: *Deleted

## 2011-06-27 ENCOUNTER — Encounter: Payer: Self-pay | Admitting: Vascular Surgery

## 2011-06-27 ENCOUNTER — Other Ambulatory Visit: Payer: Self-pay

## 2011-06-27 VITALS — BP 117/73 | HR 59 | Temp 97.5°F | Ht 71.0 in | Wt 244.0 lb

## 2011-06-27 DIAGNOSIS — N186 End stage renal disease: Secondary | ICD-10-CM

## 2011-06-27 DIAGNOSIS — T82898A Other specified complication of vascular prosthetic devices, implants and grafts, initial encounter: Secondary | ICD-10-CM

## 2011-06-27 NOTE — Progress Notes (Signed)
[facility] [place]  Patient name: Christopher Chapman MRN: [id number] DOB: 1955-11-20 Sex: male  REASON FOR VISIT: to evaluate left upper arm AV fistula for competing branches.  HPI: Christopher Chapman is a 56 y.o. male who is not yet on dialysis. He had a left brachiocephalic fistula placed in November of 2010. His kidney function has deteriorated and it is felt that he will likely need dialysis in the near future. We are asked to reevaluate his left upper arm fistula. He's had no pain in the left arm. He's had no recent nausea, vomiting, fatigue, or anorexia.   REVIEW OF SYSTEMS: Valu.Nieves ] denotes positive finding; [  ] denotes negative finding  CARDIOVASCULAR:  [ ]  chest pain   [ ]  dyspnea on exertion    CONSTITUTIONAL:  [ ]  fever   [ ]  chills  PHYSICAL EXAM: Filed Vitals:   06/27/11 1426  BP: 117/73  Pulse: 59  Temp: 97.5 F (36.4 C)  TempSrc: Oral  Height: 5\' 11"  (1.803 m)  Weight: 244 lb (110.678 kg)  SpO2: 99%   Body mass index is 34.03 kg/(m^2). GENERAL: The patient is a well-nourished male, in no acute distress. The vital signs are documented above. CARDIOVASCULAR: There is a regular rate and rhythm  PULMONARY: There is good air exchange bilaterally without wheezing or rales. He has a palpable thrill in his left upper arm fistula although the fistula does not appear especially large. Left hand is warm and well-perfused.  I have independently interpreted his duplex of his fistula which shows that the diameters of the fistula range from 0.7-0.84 cm. It is thus appears reasonable in size by duplex. Depths were also reasonable at 0.55  to .57 cm. There are 3 competing branches which are noted in the upper arm. In addition there are 2 areas of stenosis in the proximal and midportion of the fistula.  MEDICAL ISSUES: Given the areas of stenosis of competing branches at have recommended we proceed with a fistulogram with limited dye to further evaluate the fistula. If  the 2 areas of stenosis can be addressed with venoplasty this could potentially be done at the same time we can also evaluate the competing branches to determine if they need to be ligated. This procedure is scheduled for 07/16/2011.  [place] [facility] [place] Beeper: [phone]

## 2011-06-27 NOTE — Progress Notes (Signed)
Lt AVF duplex performed @ [facility] 06/27/2011

## 2011-06-29 ENCOUNTER — Other Ambulatory Visit: Payer: Self-pay

## 2011-07-04 NOTE — Procedures (Unsigned)
VASCULAR LAB EXAM  INDICATION:  End-stage renal disease.  HISTORY: Diabetes:  No. Cardiac:  No. Hypertension:  Yes. Smoking:  Currently.  EXAM:  Left brachiocephalic arteriovenous fistula duplex.  IMPRESSION: 1. Patent left arterial inflow and outflow. 2. Patent left arteriovenous fistula anastomosis. 3. Elevated velocities with calcified valve leaflets suggesting     stenosis present at the mid venous outflow segment. 4. Multiple competing branches present from the mid to proximal upper     arm venous outflow segments, as noted on worksheet. 5. Elevated velocity also present involving the left distal upper arm     venous outflow with diameter changes evident, suggesting stenosis.  ___________________________________________ Judeth Cornfield. Scot Dock, M.D.  SH/MEDQ  D:  06/27/2011  T:  06/27/2011  Job:  [id number]

## 2011-07-05 ENCOUNTER — Encounter (HOSPITAL_COMMUNITY): Payer: Self-pay | Admitting: Pharmacy Technician

## 2011-07-09 DIAGNOSIS — Z22322 Carrier or suspected carrier of Methicillin resistant Staphylococcus aureus: Secondary | ICD-10-CM

## 2011-07-09 HISTORY — DX: Carrier or suspected carrier of methicillin resistant Staphylococcus aureus: Z22.322

## 2011-07-15 MED ORDER — SODIUM CHLORIDE 0.9 % IJ SOLN: 3.0000 mL | INTRAMUSCULAR | Status: DC | PRN

## 2011-07-16 ENCOUNTER — Encounter (HOSPITAL_COMMUNITY)
Admission: RE | Disposition: A | Discharge status: Home / Self Care | Payer: Self-pay | Source: Ambulatory Visit | Attending: Vascular Surgery

## 2011-07-16 ENCOUNTER — Ambulatory Visit (HOSPITAL_COMMUNITY)
Admission: RE | Admit: 2011-07-16 | Discharge: 2011-07-16 | Disposition: A | Discharge status: Home / Self Care | Payer: Medicaid Other | Source: Ambulatory Visit | Attending: Vascular Surgery | Admitting: Vascular Surgery

## 2011-07-16 DIAGNOSIS — T82898A Other specified complication of vascular prosthetic devices, implants and grafts, initial encounter: Secondary | ICD-10-CM

## 2011-07-16 DIAGNOSIS — Z4901 Encounter for fitting and adjustment of extracorporeal dialysis catheter: Secondary | ICD-10-CM | POA: Insufficient documentation

## 2011-07-16 DIAGNOSIS — N186 End stage renal disease: Secondary | ICD-10-CM | POA: Insufficient documentation

## 2011-07-16 HISTORY — PX: SHUNTOGRAM: SHX5491

## 2011-07-16 LAB — POCT I-STAT, CHEM 8
BUN: 94 mg/dL — ABNORMAL HIGH (ref 6–23)
Creatinine, Ser: 10.3 mg/dL — ABNORMAL HIGH (ref 0.50–1.35)
Glucose, Bld: 125 mg/dL — ABNORMAL HIGH (ref 70–99)
Potassium: 3.9 mEq/L (ref 3.5–5.1)
Sodium: 138 mEq/L (ref 135–145)
TCO2: 22 mmol/L (ref 0–100)

## 2011-07-16 SURGERY — ASSESSMENT, SHUNT FUNCTION, WITH CONTRAST RADIOGRAPHIC STUDY
Anesthesia: LOCAL

## 2011-07-16 NOTE — Interval H&P Note (Signed)
History and Physical Interval Note:  07/16/2011 10:40 AM  Christopher Chapman  has presented today for surgery, with the diagnosis of End Stage Renal  The various methods of treatment have been discussed with the patient and family. After consideration of risks, benefits and other options for treatment, the patient has consented to: [name] AND POSSIBLE VENOPLASTY OF LEFT ARTERIOVENOUS FISTULA.  The patient's history has been reviewed, patient examined, no change in status, stable for surgery.  I have reviewed the patients' chart and labs.  Questions were answered to the patient's satisfaction.     DICKSON,CHRISTOPHER S

## 2011-07-16 NOTE — Op Note (Signed)
PATIENT: Christopher Chapman  MRN: [id number] DOB: October 22, 1955    DATE OF PROCEDURE: 07/16/2011  INDICATIONS: Christopher Chapman is a 56 y.o. male Who is not yet on dialysis but has progressive renal insufficiency. His left upper arm AV fistula is not ready for access. He is brought in for a fistulogram.  PROCEDURE:  1. Ultrasound-guided access to the left brachiocephalic AV fistula 2. Fistulogram left brachial cephalic AV fistula  SURGEON: Judeth Cornfield. Scot Dock, MD, FACS  ANESTHESIA: local   EBL: minimal  TECHNIQUE: The patient was brought to the peripheral vascular lab. The left arm was prepped and draped in the usual sterile fashion. After the skin was anesthetized, and under ultrasound guidance, the left brachiocephalic fistula with cannulated in a retrograde fashion. The guidewire was introduced and a micropuncture sheath introduced over the wire. fistulogram was then performed which showed multiple small competing branches in the fistula. There was no central venous stenosis noted. I was unable to reflux to the arterial anastomosis because of a valve which was present. At the completion the sheath was removed and pressure held for hemostasis. A small 4-0 Monocryl suture was placed.  FINDINGS:  1. There are multiple small competing branches of the left upper arm AV fistula. The most significant branches are located in the proximal fistula. 2. The proximal fistula could not be examined because of a proximal valve prevented reflux of contrast into the proximal fistula and brachial artery.  The patient will be scheduled for elective ligation of competing branches and also exploration of the proximal fistula based on preoperative duplex which showed some stenosis in the proximal fistula.  [name], MD, FACS [facility]  DATE OF DICTATION:   07/16/2011

## 2011-07-16 NOTE — H&P (View-Only) (Signed)
[facility]ist of [place]  Patient name: Christopher Chapman MRN: [id number] DOB: 11/11/1955 Sex: male  REASON FOR VISIT: to evaluate left upper arm AV fistula for competing branches.  HPI: Christopher Chapman is a 56 y.o. male who is not yet on dialysis. He had a left brachiocephalic fistula placed in November of 2010. His kidney function has deteriorated and it is felt that he will likely need dialysis in the near future. We are asked to reevaluate his left upper arm fistula. He's had no pain in the left arm. He's had no recent nausea, vomiting, fatigue, or anorexia.   REVIEW OF SYSTEMS: Valu.Nieves ] denotes positive finding; [  ] denotes negative finding  CARDIOVASCULAR:  [ ]  chest pain   [ ]  dyspnea on exertion    CONSTITUTIONAL:  [ ]  fever   [ ]  chills  PHYSICAL EXAM: Filed Vitals:   06/27/11 1426  BP: 117/73  Pulse: 59  Temp: 97.5 F (36.4 C)  TempSrc: Oral  Height: 5\' 11"  (1.803 m)  Weight: 244 lb (110.678 kg)  SpO2: 99%   Body mass index is 34.03 kg/(m^2). GENERAL: The patient is a well-nourished male, in no acute distress. The vital signs are documented above. CARDIOVASCULAR: There is a regular rate and rhythm  PULMONARY: There is good air exchange bilaterally without wheezing or rales. He has a palpable thrill in his left upper arm fistula although the fistula does not appear especially large. Left hand is warm and well-perfused.  I have independently interpreted his duplex of his fistula which shows that the diameters of the fistula range from 0.7-0.84 cm. It is thus appears reasonable in size by duplex. Depths were also reasonable at 0.55  to .57 cm. There are 3 competing branches which are noted in the upper arm. In addition there are 2 areas of stenosis in the proximal and midportion of the fistula.  MEDICAL ISSUES: Given the areas of stenosis of competing branches at have recommended we proceed with a fistulogram with limited dye to further evaluate the fistula. If  the 2 areas of stenosis can be addressed with venoplasty this could potentially be done at the same time we can also evaluate the competing branches to determine if they need to be ligated. This procedure is scheduled for 07/16/2011.  [place] [facility] [place] Beeper: [phone]

## 2011-08-02 ENCOUNTER — Other Ambulatory Visit: Payer: Self-pay

## 2011-08-06 ENCOUNTER — Encounter (HOSPITAL_COMMUNITY): Payer: Self-pay | Admitting: Pharmacy Technician

## 2011-08-08 ENCOUNTER — Encounter (HOSPITAL_COMMUNITY): Payer: Self-pay

## 2011-08-08 ENCOUNTER — Encounter (HOSPITAL_COMMUNITY)
Admission: RE | Admit: 2011-08-08 | Discharge: 2011-08-08 | Disposition: A | Discharge status: Home / Self Care | Payer: Medicaid Other | Source: Ambulatory Visit | Attending: Vascular Surgery | Admitting: Vascular Surgery

## 2011-08-08 ENCOUNTER — Ambulatory Visit (HOSPITAL_COMMUNITY)
Admission: RE | Admit: 2011-08-08 | Discharge: 2011-08-08 | Disposition: A | Discharge status: Home / Self Care | Payer: Medicaid Other | Source: Ambulatory Visit | Attending: Vascular Surgery | Admitting: Vascular Surgery

## 2011-08-08 DIAGNOSIS — R05 Cough: Secondary | ICD-10-CM | POA: Insufficient documentation

## 2011-08-08 DIAGNOSIS — Z01812 Encounter for preprocedural laboratory examination: Secondary | ICD-10-CM | POA: Insufficient documentation

## 2011-08-08 DIAGNOSIS — R059 Cough, unspecified: Secondary | ICD-10-CM | POA: Insufficient documentation

## 2011-08-08 DIAGNOSIS — Z01818 Encounter for other preprocedural examination: Secondary | ICD-10-CM | POA: Insufficient documentation

## 2011-08-08 DIAGNOSIS — R079 Chest pain, unspecified: Secondary | ICD-10-CM | POA: Insufficient documentation

## 2011-08-08 DIAGNOSIS — I10 Essential (primary) hypertension: Secondary | ICD-10-CM | POA: Insufficient documentation

## 2011-08-08 HISTORY — DX: Disorder of parathyroid gland, unspecified: E21.5

## 2011-08-08 HISTORY — DX: Unspecified osteoarthritis, unspecified site: M19.90

## 2011-08-08 HISTORY — DX: Respiratory tuberculosis unspecified: A15.9

## 2011-08-08 HISTORY — DX: Gout, unspecified: M10.9

## 2011-08-08 HISTORY — DX: Family history of other specified conditions: Z84.89

## 2011-08-08 MED ORDER — CEFAZOLIN SODIUM 1-5 GM-% IV SOLN: 1.0000 g | Freq: Once | INTRAVENOUS | Status: DC

## 2011-08-08 MED ORDER — CEFAZOLIN SODIUM-DEXTROSE 2-3 GM-% IV SOLR
2.0000 g | Freq: Once | INTRAVENOUS | Status: AC
  Administered 2011-08-09: 2 g via INTRAVENOUS
  Filled 2011-08-08: qty 50

## 2011-08-08 MED ORDER — SODIUM CHLORIDE 0.9 % IV SOLN: INTRAVENOUS | Status: DC

## 2011-08-08 NOTE — Pre-Procedure Instructions (Addendum)
[place]  08/08/2011   Your procedure is scheduled on:  Thursday, August 1st.  Report to [facility] at 8:30 AM.  Call this number if you have problems the morning of surgery: [phone]   Remember:   Do not eat food or drink any liquid:After Midnight.      Take these medicines the morning of surgery with A SIP OF WATER: Atenolol (Tenormin), Clonidine (Catapres), Allopurinol (Zyloprim), Amlodipine(Norvasc).   Do not wear jewelry, make-up or nail polish.  Do not wear lotions, powders, or perfumes. You may wear deodorant.  Do not shave 48 hours prior to surgery. Men may shave face and neck.  Do not bring valuables to the hospital.  Contacts, dentures or bridgework may not be worn into surgery.  Leave suitcase in the car. After surgery it may be brought to your room.  For patients admitted to the hospital, checkout time is 11:00 AM the day of discharge.   Patients discharged the day of surgery will not be allowed to drive home.  Name and phone number of your driver: _____________  Special Instructions: CHG Shower Use Special Wash: 1/2 bottle night before surgery and 1/2 bottle morning of surgery.   Please read over the following fact sheets that you were given: Pain Booklet, Coughing and Deep Breathing and Surgical Site Infection Prevention

## 2011-08-09 ENCOUNTER — Ambulatory Visit (HOSPITAL_COMMUNITY): Payer: Medicaid Other | Admitting: Certified Registered"

## 2011-08-09 ENCOUNTER — Encounter (HOSPITAL_COMMUNITY)
Admission: RE | Disposition: A | Discharge status: Home / Self Care | Payer: Self-pay | Source: Ambulatory Visit | Attending: Vascular Surgery

## 2011-08-09 ENCOUNTER — Ambulatory Visit (HOSPITAL_COMMUNITY)
Admission: RE | Admit: 2011-08-09 | Discharge: 2011-08-09 | Disposition: A | Discharge status: Home / Self Care | Payer: Medicaid Other | Source: Ambulatory Visit | Attending: Vascular Surgery | Admitting: Vascular Surgery

## 2011-08-09 ENCOUNTER — Encounter (HOSPITAL_COMMUNITY): Payer: Self-pay | Admitting: Certified Registered"

## 2011-08-09 DIAGNOSIS — N189 Chronic kidney disease, unspecified: Secondary | ICD-10-CM | POA: Insufficient documentation

## 2011-08-09 DIAGNOSIS — I129 Hypertensive chronic kidney disease with stage 1 through stage 4 chronic kidney disease, or unspecified chronic kidney disease: Secondary | ICD-10-CM | POA: Insufficient documentation

## 2011-08-09 DIAGNOSIS — Y832 Surgical operation with anastomosis, bypass or graft as the cause of abnormal reaction of the patient, or of later complication, without mention of misadventure at the time of the procedure: Secondary | ICD-10-CM | POA: Insufficient documentation

## 2011-08-09 DIAGNOSIS — T82898A Other specified complication of vascular prosthetic devices, implants and grafts, initial encounter: Secondary | ICD-10-CM

## 2011-08-09 DIAGNOSIS — N186 End stage renal disease: Secondary | ICD-10-CM

## 2011-08-09 DIAGNOSIS — Z01818 Encounter for other preprocedural examination: Secondary | ICD-10-CM | POA: Insufficient documentation

## 2011-08-09 DIAGNOSIS — I871 Compression of vein: Secondary | ICD-10-CM | POA: Insufficient documentation

## 2011-08-09 HISTORY — PX: ANGIOPLASTY: SHX39

## 2011-08-09 LAB — POCT I-STAT 4, (NA,K, GLUC, HGB,HCT)
Glucose, Bld: 121 mg/dL — ABNORMAL HIGH (ref 70–99)
HCT: 34 % — ABNORMAL LOW (ref 39.0–52.0)
Hemoglobin: 11.6 g/dL — ABNORMAL LOW (ref 13.0–17.0)
Potassium: 3.9 mEq/L (ref 3.5–5.1)
Sodium: 141 mEq/L (ref 135–145)

## 2011-08-09 SURGERY — REVISON OF ARTERIOVENOUS FISTULA
Anesthesia: Monitor Anesthesia Care | Site: Arm Upper | Laterality: Left

## 2011-08-09 SURGERY — REVISON OF ARTERIOVENOUS FISTULA
Anesthesia: Monitor Anesthesia Care | Laterality: Left | Wound class: Clean

## 2011-08-09 MED ORDER — FENTANYL CITRATE 0.05 MG/ML IJ SOLN
INTRAMUSCULAR | Status: DC | PRN
  Administered 2011-08-09 (×2): 50 ug via INTRAVENOUS

## 2011-08-09 MED ORDER — MIDAZOLAM HCL 5 MG/5ML IJ SOLN
INTRAMUSCULAR | Status: DC | PRN
  Administered 2011-08-09 (×2): 1 mg via INTRAVENOUS

## 2011-08-09 MED ORDER — HYDROMORPHONE HCL PF 1 MG/ML IJ SOLN
INTRAMUSCULAR | Status: AC
  Filled 2011-08-09: qty 1

## 2011-08-09 MED ORDER — OXYCODONE-ACETAMINOPHEN 5-325 MG PO TABS: 1.0000 | ORAL_TABLET | ORAL | Status: AC | PRN

## 2011-08-09 MED ORDER — SODIUM CHLORIDE 0.9 % IR SOLN
Status: DC | PRN
  Administered 2011-08-09: 14:00:00

## 2011-08-09 MED ORDER — HEPARIN SODIUM (PORCINE) 1000 UNIT/ML IJ SOLN
INTRAMUSCULAR | Status: DC | PRN
  Administered 2011-08-09: 8000 [IU] via INTRAVENOUS

## 2011-08-09 MED ORDER — 0.9 % SODIUM CHLORIDE (POUR BTL) OPTIME
TOPICAL | Status: DC | PRN
  Administered 2011-08-09: 1000 mL

## 2011-08-09 MED ORDER — LIDOCAINE-EPINEPHRINE (PF) 1 %-1:200000 IJ SOLN
INTRAMUSCULAR | Status: DC | PRN
  Administered 2011-08-09: 30 mL

## 2011-08-09 MED ORDER — ONDANSETRON HCL 4 MG/2ML IJ SOLN: 4.0000 mg | Freq: Once | INTRAMUSCULAR | Status: DC | PRN

## 2011-08-09 MED ORDER — THROMBIN 20000 UNITS EX SOLR
CUTANEOUS | Status: AC
  Filled 2011-08-09: qty 20000

## 2011-08-09 MED ORDER — PROPOFOL 10 MG/ML IV EMUL
INTRAVENOUS | Status: DC | PRN
  Administered 2011-08-09: 100 ug/kg/min via INTRAVENOUS

## 2011-08-09 MED ORDER — ONDANSETRON HCL 4 MG/2ML IJ SOLN
INTRAMUSCULAR | Status: DC | PRN
  Administered 2011-08-09: 4 mg via INTRAVENOUS

## 2011-08-09 MED ORDER — HYDROMORPHONE HCL PF 1 MG/ML IJ SOLN
0.2500 mg | INTRAMUSCULAR | Status: DC | PRN
  Administered 2011-08-09: 0.5 mg via INTRAVENOUS

## 2011-08-09 MED ORDER — LIDOCAINE-EPINEPHRINE (PF) 1 %-1:200000 IJ SOLN
INTRAMUSCULAR | Status: AC
  Filled 2011-08-09: qty 10

## 2011-08-09 MED ORDER — SODIUM CHLORIDE 0.9 % IV SOLN
INTRAVENOUS | Status: DC | PRN
  Administered 2011-08-09: 13:00:00 via INTRAVENOUS

## 2011-08-09 MED ORDER — LIDOCAINE HCL (PF) 1 % IJ SOLN
INTRAMUSCULAR | Status: AC
  Filled 2011-08-09: qty 30

## 2011-08-09 SURGICAL SUPPLY — 35 items
CANISTER SUCTION 2500CC (MISCELLANEOUS) ×2 IMPLANT
CLIP TI MEDIUM 6 (CLIP) ×2 IMPLANT
CLIP TI WIDE RED SMALL 6 (CLIP) ×2 IMPLANT
CLOTH BEACON ORANGE TIMEOUT ST (SAFETY) ×2 IMPLANT
DECANTER SPIKE VIAL GLASS SM (MISCELLANEOUS) ×2 IMPLANT
DERMABOND ADVANCED (GAUZE/BANDAGES/DRESSINGS) ×2
DERMABOND ADVANCED .7 DNX12 (GAUZE/BANDAGES/DRESSINGS) ×2 IMPLANT
DRAIN PENROSE 1/2X12 LTX STRL (WOUND CARE) IMPLANT
ELECT REM PT RETURN 9FT ADLT (ELECTROSURGICAL) ×2
ELECTRODE REM PT RTRN 9FT ADLT (ELECTROSURGICAL) ×1 IMPLANT
GLOVE BIO SURGEON STRL SZ7.5 (GLOVE) ×2 IMPLANT
GLOVE BIOGEL PI IND STRL 7.5 (GLOVE) ×4 IMPLANT
GLOVE BIOGEL PI INDICATOR 7.5 (GLOVE) ×4
GLOVE SS BIOGEL STRL SZ 7 (GLOVE) ×1 IMPLANT
GLOVE SUPERSENSE BIOGEL SZ 7 (GLOVE) ×1
GLOVE SURG SS PI 7.5 STRL IVOR (GLOVE) ×6 IMPLANT
GOWN PREVENTION PLUS XLARGE (GOWN DISPOSABLE) ×6 IMPLANT
GOWN STRL NON-REIN LRG LVL3 (GOWN DISPOSABLE) ×4 IMPLANT
KIT BASIN OR (CUSTOM PROCEDURE TRAY) ×2 IMPLANT
KIT ROOM TURNOVER OR (KITS) ×2 IMPLANT
NS IRRIG 1000ML POUR BTL (IV SOLUTION) ×2 IMPLANT
PACK CV ACCESS ×2 IMPLANT
PAD ARMBOARD 7.5X6 YLW CONV (MISCELLANEOUS) ×2 IMPLANT
SPONGE GAUZE 4X4 12PLY (GAUZE/BANDAGES/DRESSINGS) ×2 IMPLANT
SPONGE SURGIFOAM ABS GEL 100 (HEMOSTASIS) IMPLANT
SUT PROLENE 6 0 BV (SUTURE) ×2 IMPLANT
SUT VIC AB 3-0 FS2 27 (SUTURE) ×4 IMPLANT
SUT VIC AB 3-0 SH 27 (SUTURE) ×1
SUT VIC AB 3-0 SH 27X BRD (SUTURE) ×1 IMPLANT
SUT VICRYL 4-0 PS2 18IN ABS (SUTURE) ×2 IMPLANT
TOWEL OR 17X24 6PK STRL BLUE (TOWEL DISPOSABLE) ×2 IMPLANT
TOWEL OR 17X26 10 PK STRL BLUE (TOWEL DISPOSABLE) ×2 IMPLANT
UNDERPAD 30X30 INCONTINENT (UNDERPADS AND DIAPERS) ×2 IMPLANT
Vascu-Guard Vascular Patch ×2 IMPLANT
WATER STERILE IRR 1000ML POUR (IV SOLUTION) ×2 IMPLANT

## 2011-08-09 SURGICAL SUPPLY — 31 items
CANISTER SUCTION 2500CC (MISCELLANEOUS) ×2 IMPLANT
CLIP TI MEDIUM 6 (CLIP) ×2 IMPLANT
CLIP TI WIDE RED SMALL 6 (CLIP) ×2 IMPLANT
CLOTH BEACON ORANGE TIMEOUT ST (SAFETY) ×2 IMPLANT
COVER PROBE W GEL 5X96 (DRAPES) ×2 IMPLANT
COVER SURGICAL LIGHT HANDLE (MISCELLANEOUS) ×2 IMPLANT
DECANTER SPIKE VIAL GLASS SM (MISCELLANEOUS) ×2 IMPLANT
DERMABOND ADVANCED (GAUZE/BANDAGES/DRESSINGS) ×1
DERMABOND ADVANCED .7 DNX12 (GAUZE/BANDAGES/DRESSINGS) ×1 IMPLANT
DRAIN PENROSE 1/2X12 LTX STRL (WOUND CARE) IMPLANT
ELECT REM PT RETURN 9FT ADLT (ELECTROSURGICAL) ×2
ELECTRODE REM PT RTRN 9FT ADLT (ELECTROSURGICAL) ×1 IMPLANT
GLOVE BIO SURGEON STRL SZ7.5 (GLOVE) ×2 IMPLANT
GLOVE BIOGEL PI IND STRL 7.5 (GLOVE) ×1 IMPLANT
GLOVE BIOGEL PI INDICATOR 7.5 (GLOVE) ×1
GOWN STRL NON-REIN LRG LVL3 (GOWN DISPOSABLE) ×4 IMPLANT
KIT BASIN OR (CUSTOM PROCEDURE TRAY) ×2 IMPLANT
KIT ROOM TURNOVER OR (KITS) ×2 IMPLANT
NS IRRIG 1000ML POUR BTL (IV SOLUTION) ×2 IMPLANT
PACK CV ACCESS (CUSTOM PROCEDURE TRAY) ×2 IMPLANT
PAD ARMBOARD 7.5X6 YLW CONV (MISCELLANEOUS) ×4 IMPLANT
SPONGE GAUZE 4X4 12PLY (GAUZE/BANDAGES/DRESSINGS) ×2 IMPLANT
SPONGE SURGIFOAM ABS GEL 100 (HEMOSTASIS) IMPLANT
SUT PROLENE 6 0 BV (SUTURE) ×2 IMPLANT
SUT VIC AB 3-0 SH 27 (SUTURE) ×1
SUT VIC AB 3-0 SH 27X BRD (SUTURE) ×1 IMPLANT
SUT VICRYL 4-0 PS2 18IN ABS (SUTURE) ×2 IMPLANT
TOWEL OR 17X24 6PK STRL BLUE (TOWEL DISPOSABLE) ×2 IMPLANT
TOWEL OR 17X26 10 PK STRL BLUE (TOWEL DISPOSABLE) ×2 IMPLANT
UNDERPAD 30X30 INCONTINENT (UNDERPADS AND DIAPERS) ×2 IMPLANT
WATER STERILE IRR 1000ML POUR (IV SOLUTION) ×2 IMPLANT

## 2011-08-09 NOTE — Op Note (Signed)
NAME: Christopher Chapman   MRN: [id number] DOB: [birth year]    DATE OF OPERATION: 08/09/2011  PREOP DIAGNOSIS: chronic kidney disease  POSTOP DIAGNOSIS: same  PROCEDURE: revision of left brachiocephalic AV fistula (ligation of 3 competing branches, bovine pericardial patch angioplasty of proximal fistula)  SURGEON: Judeth Cornfield. Scot Dock, MD, FACS  ASSIST: Christopher Lins PA  ANESTHESIA: local with sedation   EBL: minimal  INDICATIONS: Christopher Chapman is a 56 y.o. male who is not yet on dialysis. Postoperative duplex showed an area of stenosis and his fistula and also several competing branches. He is brought in for revision of this fistula.  FINDINGS: there was marked intimal hyperplasia at the area of stenosis.  TECHNIQUE: Patient was brought to the operating room and sedated by anesthesia. Her left upper extremity was prepped and draped in the usual sterile fashion. Using the ultrasound scanner the largest branches were identified and marked. The area of stenosis was identified and marked. After the skin was anesthetized, 3 small transverse incisions were made over the branches. Each of these levels the vein was dissected free and the branches were ligated and divided between 3-0 silk ties. Next attention was turned to the area of stenosis. After the skin was anesthetized, a longitudinal incision was made over the area of concern and the vein here was dissected free. The patient was then heparinized. The vein was clamped proximally and distally and a longitudinal venotomy made. A bovine pericardial patch was then sewn after the intimal hyperplasia was excised. At completion was an excellent thrill in the fistula. Each of the incisions was then closed with deep layer 3-0 Vicryl and the skin closed with 4-0 Vicryl. Dermabond was applied. The patient tolerated the procedure well and was transferred to the recovery room in stable condition. All needle and sponge counts were correct.  [name], MD, FACS [facility] of [facility]  DATE OF DICTATION:   08/09/2011

## 2011-08-09 NOTE — Anesthesia Postprocedure Evaluation (Signed)
Anesthesia Post Note  Patient: Christopher Chapman  Procedure(s) Performed: Procedure(s) (LRB): REVISON OF ARTERIOVENOUS FISTULA (Left) LIGATION OF COMPETING BRANCHES OF ARTERIOVENOUS FISTULA (Left) ANGIOPLASTY (Left)  Anesthesia type: general  Patient location: PACU  Post pain: Pain level controlled  Post assessment: Patient's Cardiovascular Status Stable  Last Vitals:  Filed Vitals:   08/09/11 1518  BP:   Pulse: 71  Temp:   Resp: 20    Post vital signs: Reviewed and stable  Level of consciousness: sedated  Complications: No apparent anesthesia complications

## 2011-08-09 NOTE — Anesthesia Preprocedure Evaluation (Signed)
Anesthesia Evaluation  Patient identified by MRN, date of birth, ID band Patient awake    Reviewed: Allergy & Precautions, H&P , NPO status , Patient's Chart, lab work & pertinent test results  Airway Mallampati: I TM Distance: >3 FB Neck ROM: Full    Dental   Pulmonary          Cardiovascular hypertension, Pt. on medications     Neuro/Psych    GI/Hepatic   Endo/Other    Renal/GU      Musculoskeletal   Abdominal   Peds  Hematology   Anesthesia Other Findings   Reproductive/Obstetrics                           Anesthesia Physical Anesthesia Plan  ASA: III  Anesthesia Plan: MAC   Post-op Pain Management:    Induction: Intravenous  Airway Management Planned: Simple Face Mask  Additional Equipment:   Intra-op Plan:   Post-operative Plan:   Informed Consent: I have reviewed the patients History and Physical, chart, labs and discussed the procedure including the risks, benefits and alternatives for the proposed anesthesia with the patient or authorized representative who has indicated his/her understanding and acceptance.     Plan Discussed with: CRNA and Surgeon  Anesthesia Plan Comments:         Anesthesia Quick Evaluation

## 2011-08-09 NOTE — Transfer of Care (Signed)
Immediate Anesthesia Transfer of Care Note  Patient: OVIE SITZES  Procedure(s) Performed: Procedure(s) (LRB): REVISON OF ARTERIOVENOUS FISTULA (Left) LIGATION OF COMPETING BRANCHES OF ARTERIOVENOUS FISTULA (Left) ANGIOPLASTY (Left)  Patient Location: PACU  Anesthesia Type: MAC  Level of Consciousness: awake, alert  and oriented  Airway & Oxygen Therapy: Patient Spontanous Breathing  Post-op Assessment: Report given to PACU RN, Post -op Vital signs reviewed and stable and Patient moving all extremities  Post vital signs: Reviewed and stable  Complications: No apparent anesthesia complications

## 2011-08-09 NOTE — Preoperative (Signed)
Beta Blockers   Reason not to administer Beta Blockers:Not Applicable 

## 2011-08-09 NOTE — Progress Notes (Signed)
Pt starting to get dressed and noticed bleeding at upper incision, a continual oozing that is not stopping with pressure.  Spoke with Dr Scot Dock and he said to page Benjamine Sprague PA. Which I did and she will be in to see him.

## 2011-08-09 NOTE — H&P (Signed)
  [facility]ist of [place]  Patient name: Christopher Chapman MRN: [id number] DOB: [birth year] Sex: male  REASON FOR VISIT: to evaluate left upper arm AV fistula for competing branches.  HPI:  Christopher Chapman is a 56 y.o. male who is not yet on dialysis. He had a left brachiocephalic fistula placed in November of 2010. His kidney function has deteriorated and it is felt that he will likely need dialysis in the near future. We are asked to reevaluate his left upper arm fistula. He's had no pain in the left arm. He's had no recent nausea, vomiting, fatigue, or anorexia.  REVIEW OF SYSTEMS: Valu.Nieves ] denotes positive finding; [ ]  denotes negative finding  CARDIOVASCULAR: [ ]  chest pain [ ]  dyspnea on exertion  CONSTITUTIONAL: [ ]  fever [ ]  chills  PHYSICAL EXAM:  Filed Vitals:    06/27/11 1426   BP:  117/73   Pulse:  59   Temp:  97.5 F (36.4 C)   TempSrc:  Oral   Height:  5\' 11"  (1.803 m)   Weight:  244 lb (110.678 kg)   SpO2:  99%    Body mass index is 34.03 kg/(m^2).  GENERAL: The patient is a well-nourished male, in no acute distress. The vital signs are documented above.  CARDIOVASCULAR: There is a regular rate and rhythm  PULMONARY: There is good air exchange bilaterally without wheezing or rales.  He has a palpable thrill in his left upper arm fistula although the fistula does not appear especially large. Left hand is warm and well-perfused.  I have independently interpreted his duplex of his fistula which shows that the diameters of the fistula range from 0.7-0.84 cm. It is thus appears reasonable in size by duplex. Depths were also reasonable at 0.55 to .57 cm. There are 3 competing branches which are noted in the upper arm. In addition there are 2 areas of stenosis in the proximal and midportion of the fistula.  MEDICAL ISSUES:  Given the areas of stenosis of competing branches at have recommended we proceed with a fistulogram with limited dye to further evaluate the  fistula. If the 2 areas of stenosis can be addressed with venoplasty this could potentially be done at the same time we can also evaluate the competing branches to determine if they need to be ligated. This procedure is scheduled for 07/16/2011.  [place]  [facility] of [place]  Beeper: [phone]

## 2011-08-09 NOTE — Progress Notes (Signed)
PA in to see pt.  Pressure held over site by PA and then pressure dressing applied.  Instructed pt not to remove dressing X48 hours with teach back.

## 2011-08-10 ENCOUNTER — Telehealth: Payer: Self-pay | Admitting: Vascular Surgery

## 2011-08-10 NOTE — Telephone Encounter (Signed)
Message copied by Gena Fray on Fri Aug 10, 2011  9:14 AM ------      Message from: Alfonso Patten      Created: Thu Aug 09, 2011  4:26 PM      Regarding: FW: charge and follow up                   ----- Message -----         From: Angelia Mould, MD         Sent: 08/09/2011   3:15 PM           To: Patrici Ranks, Alfonso Patten, RN      Subject: charge and follow up                                     PROCEDURE: revision of left brachiocephalic AV fistula (ligation of 3 competing branches, bovine pericardial patch angioplasty of proximal fistula)            SURGEON: Judeth Cornfield. Scot Dock, MD, FACS            ASSIST: Gerri Lins PA            This patient needs a follow up visit in approximately 3 weeks. Thank you. CSD

## 2011-08-10 NOTE — Telephone Encounter (Signed)
Patient notified via voicemail, dpm

## 2011-08-14 ENCOUNTER — Encounter (HOSPITAL_COMMUNITY): Payer: Self-pay | Admitting: Vascular Surgery

## 2011-09-04 ENCOUNTER — Encounter: Payer: Self-pay | Admitting: Vascular Surgery

## 2011-09-05 ENCOUNTER — Ambulatory Visit (INDEPENDENT_AMBULATORY_CARE_PROVIDER_SITE_OTHER): Payer: Medicaid Other | Admitting: Vascular Surgery

## 2011-09-05 ENCOUNTER — Encounter: Payer: Self-pay | Admitting: Vascular Surgery

## 2011-09-05 VITALS — BP 118/73 | HR 65 | Temp 98.1°F | Ht 72.0 in | Wt 243.0 lb

## 2011-09-05 DIAGNOSIS — N186 End stage renal disease: Secondary | ICD-10-CM

## 2011-09-05 NOTE — Progress Notes (Signed)
[facility] of [place]  Patient name: Christopher Chapman MRN: [id number] DOB: July 02, 1955 Sex: male  REASON FOR VISIT: follow up after revision of left brachiocephalic AV fistula.  HPI: Christopher Chapman is a 56 y.o. male who had a left brachiocephalic fistula placed in November of 2010. He is not yet on dialysis. He been seen in our office and was found to have an area of narrowing within the fistula and also some competing branches. This was confirmed by a fistulogram which was performed on 07/16/11. On 08/09/11 he had revision of his left brachiocephalic fistula. 3 competing branches were ligated and a bovine pericardial patch angioplasty of the proximal fistula was performed. Duplex scan of his fistula prior to his revision showed that the diameter of the vein ranged from 0.7-2.84 cm and the depth the majority the fistula was approximately 0.55 cm.  REVIEW OF SYSTEMS: Valu.Nieves ] denotes positive finding; [  ] denotes negative finding  CARDIOVASCULAR:  [ ]  chest pain   [ ]  dyspnea on exertion    CONSTITUTIONAL:  [ ]  fever   [ ]  chills  PHYSICAL EXAM: Filed Vitals:   09/05/11 0907  BP: 118/73  Pulse: 65  Temp: 98.1 F (36.7 C)  TempSrc: Oral  Height: 6' (1.829 m)  Weight: 243 lb (110.224 kg)  SpO2: 100%   Body mass index is 32.96 kg/(m^2). GENERAL: The patient is a well-nourished male, in no acute distress. The vital signs are documented above. CARDIOVASCULAR: There is a regular rate and rhythm  PULMONARY: There is good air exchange bilaterally without wheezing or rales. The left upper arm fistula has an excellent thrill and bruit. The incisions are healing nicely. The left hand is warm and well-perfused.  MEDICAL ISSUES: I think this fistula should be usable for access if and when it is needed. Based on the previous duplex scan the diameter and depth were reasonable. This should only have improved since his revision. We will see him back as needed. I've encouraged him to  continue to exercise his arm.  [place] [facility] of [place] Beeper: [phone]

## 2011-12-31 ENCOUNTER — Encounter (HOSPITAL_COMMUNITY): Payer: Self-pay | Admitting: *Deleted

## 2011-12-31 ENCOUNTER — Emergency Department (HOSPITAL_COMMUNITY)
Admission: EM | Admit: 2011-12-31 | Discharge: 2011-12-31 | Disposition: A | Discharge status: Home / Self Care | Payer: Medicaid Other | Source: Home / Self Care | Attending: Emergency Medicine | Admitting: Emergency Medicine

## 2011-12-31 ENCOUNTER — Other Ambulatory Visit (HOSPITAL_COMMUNITY)
Admission: RE | Admit: 2011-12-31 | Discharge: 2011-12-31 | Disposition: A | Discharge status: Home / Self Care | Payer: Medicaid Other | Source: Ambulatory Visit | Attending: Emergency Medicine | Admitting: Emergency Medicine

## 2011-12-31 DIAGNOSIS — L0291 Cutaneous abscess, unspecified: Secondary | ICD-10-CM

## 2011-12-31 DIAGNOSIS — Z113 Encounter for screening for infections with a predominantly sexual mode of transmission: Secondary | ICD-10-CM | POA: Insufficient documentation

## 2011-12-31 DIAGNOSIS — L03319 Cellulitis of trunk, unspecified: Secondary | ICD-10-CM

## 2011-12-31 DIAGNOSIS — L723 Sebaceous cyst: Secondary | ICD-10-CM

## 2011-12-31 DIAGNOSIS — L0390 Cellulitis, unspecified: Secondary | ICD-10-CM

## 2011-12-31 MED ORDER — DOXYCYCLINE HYCLATE 100 MG PO CAPS: 100.0000 mg | ORAL_CAPSULE | Freq: Every day | ORAL | Status: AC

## 2011-12-31 NOTE — ED Provider Notes (Addendum)
History     CSN: [id number]  Arrival date & time 12/31/11  1416   First MD Initiated Contact with Patient 12/31/11 1512      Chief Complaint  Patient presents with  . Cyst    (Consider location/radiation/quality/duration/timing/severity/associated sxs/prior treatment) HPI Comments: Patient presents urgent care this afternoon complaining of a large cyst on his back it is tender at touch and he believes it is infected now. In the past he has had an infection at the same site before. It's been almost a week now the area has become larger tenderness not draining on its own. Has been applying heat to it. Patient also describes that he had unprotected sex last week also be checked for STDs denies any penile discharge or urinary symptoms. Describes that his fistula is ready for his ongoing hemodialysis treatments.  The history is provided by the patient.    Past Medical History  Diagnosis Date  . CRF (chronic renal failure)   . Hypertension   . Family history of anesthesia complication     [place] going to sleep.  . Parathyroid disease     Hx of , No meds at present  . 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  . Gout   . Arthritis     Past Surgical History  Procedure Date  . Av fistula placement 2010  . Appendectomy 1980  . Mandible fracture surgery   . Angioplasty 08/09/2011    Procedure: ANGIOPLASTY;  Surgeon: Angelia Mould, MD;  Location: [facility];  Service: Vascular;  Laterality: Left;  Left Braciocephalic Fistula using Vascu-Guard Patch    Family History  Problem Relation Age of Onset  . Diabetes Father   . Heart disease Father   . Hypertension Father   . Other Father     amputation  . Diabetes Sister   . Heart disease Sister     History  Substance Use Topics  . Smoking status: Current Some Day Smoker -- 0.1 packs/day for 30 years    Types: Cigarettes  . Smokeless tobacco: Never Used     Comment: pt states that he is aware of how to  quit  . Alcohol Use: 2.4 oz/week    4 Cans of beer per week     Comment: occasional      Review of Systems  Constitutional: Negative for fever, chills, activity change, appetite change and fatigue.  Gastrointestinal: Negative for nausea and diarrhea.  Skin: Positive for color change and rash. Negative for pallor.    Allergies  Review of patient's allergies indicates no known allergies.  Home Medications   Current Outpatient Rx  Name  Route  Sig  Dispense  Refill  . ALLOPURINOL 100 MG PO TABS   Oral   Take 200 mg by mouth daily.          Marland Kitchen AMLODIPINE BESYLATE 10 MG PO TABS   Oral   Take 10 mg by mouth daily.         . ATENOLOL 50 MG PO TABS   Oral   Take 100 mg by mouth daily.          Marland Kitchen CALCIUM 500/D PO   Oral   Take 2 tablets by mouth 3 (three) times daily.          Marland Kitchen CLONIDINE HCL 0.2 MG PO TABS   Oral   Take 0.2 mg by mouth 2 (two) times daily.          Marland Kitchen  COLCHICINE 0.6 MG PO TABS   Oral   Take 0.6 mg by mouth at bedtime.         Marland Kitchen DOXAZOSIN MESYLATE 8 MG PO TABS   Oral   Take 8 mg by mouth at bedtime.         . FUROSEMIDE 40 MG PO TABS   Oral   Take 40 mg by mouth daily.         Marland Kitchen SIMVASTATIN 20 MG PO TABS   Oral   Take 20 mg by mouth at bedtime.          . SODIUM BICARBONATE 650 MG PO TABS   Oral   Take 650 mg by mouth 3 (three) times daily.         Marland Kitchen DOXYCYCLINE HYCLATE 100 MG PO CAPS   Oral   Take 1 capsule (100 mg total) by mouth daily.   7 capsule   0     BP 131/77  Pulse 70  Temp 97 F (36.1 C) (Oral)  Resp 20  SpO2 95%  Physical Exam  Nursing note and vitals reviewed. Constitutional: Vital signs are normal. He appears well-developed and well-nourished.  Non-toxic appearance. He does not have a sickly appearance. He does not appear ill.  Eyes: Conjunctivae normal are normal.  Neck: Neck supple.  Pulmonary/Chest: Effort normal and breath sounds normal.  Abdominal: Soft. He exhibits no distension. There is  no tenderness.  Musculoskeletal:       Back:  Neurological: He is alert.  Skin: Rash noted. There is erythema.    ED Course  INCISION AND DRAINAGE Performed by: Kimbely Whiteaker Authorized by: Nyajah Hyson Consent given by: patient Patient understanding: patient states understanding of the procedure being performed Required items: required blood products, implants, devices, and special equipment available Patient identity confirmed: verbally with patient Time out: Immediately prior to procedure a "time out" was called to verify the correct patient, procedure, equipment, support staff and site/side marked as required. Type: abscess Body area: trunk Local anesthetic: lidocaine 2% without epinephrine Anesthetic total: 5 ml Scalpel size: 15 Complexity: simple Drainage: purulent Packing material: Vaseline gauze and 1/2 in gauze Patient tolerance: Patient tolerated the procedure well with no immediate complications.   (including critical care time)   Labs Reviewed  URINE CYTOLOGY ANCILLARY ONLY  CULTURE, ROUTINE-ABSCESS   No results found.   1. Sebaceous cyst   2. Abscess       MDM  Problem #1 infected sebaceoyus cyst with abscess. I +D  patient will start on a renal dose of doxycycline problem #2 STD  concern. We have ordered a urine cytology for STD screening. Vernona Rieger, MD 12/31/11 1554  Rosana Hoes, MD 12/31/11 [phone]

## 2011-12-31 NOTE — ED Notes (Signed)
I and D done and culture obtained by Dr. Denton Ar.

## 2011-12-31 NOTE — ED Notes (Signed)
C/o boil L upper back onset last Monday.  Dr. Denton Ar said it is an infected sebaceous cyst.  No drainage.  C/o pain.

## 2012-01-03 ENCOUNTER — Emergency Department (INDEPENDENT_AMBULATORY_CARE_PROVIDER_SITE_OTHER)
Admission: EM | Admit: 2012-01-03 | Discharge: 2012-01-03 | Disposition: A | Discharge status: Home / Self Care | Payer: Medicaid Other | Source: Home / Self Care | Attending: Family Medicine | Admitting: Family Medicine

## 2012-01-03 ENCOUNTER — Encounter (HOSPITAL_COMMUNITY): Payer: Self-pay | Admitting: *Deleted

## 2012-01-03 DIAGNOSIS — L02219 Cutaneous abscess of trunk, unspecified: Secondary | ICD-10-CM

## 2012-01-03 DIAGNOSIS — L03319 Cellulitis of trunk, unspecified: Secondary | ICD-10-CM

## 2012-01-03 DIAGNOSIS — L02212 Cutaneous abscess of back [any part, except buttock]: Secondary | ICD-10-CM

## 2012-01-03 LAB — CULTURE, ROUTINE-ABSCESS

## 2012-01-03 NOTE — ED Notes (Signed)
Here for packing removal.  Had I and D 3 days ago.  No problems.

## 2012-01-03 NOTE — ED Provider Notes (Signed)
History     CSN: [id number]  Arrival date & time 01/03/12  1521   First MD Initiated Contact with Patient 01/03/12 1712      No chief complaint on file.   (Consider location/radiation/quality/duration/timing/severity/associated sxs/prior treatment) Patient is a 56 y.o. male presenting with wound check. The history is provided by the patient.  Wound Check  He was treated in the ED 2 to 3 days ago. Previous treatment in the ED includes I&D of abscess. Treatments since wound repair include oral antibiotics. His temperature was unmeasured prior to arrival. There has been no drainage from the wound. There is no redness present. There is no swelling present. The pain has no pain.    Past Medical History  Diagnosis Date  . CRF (chronic renal failure)   . Hypertension   . Family history of anesthesia complication     [place] going to sleep.  . Parathyroid disease     Hx of , No meds at present  . 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  . Gout   . Arthritis     Past Surgical History  Procedure Date  . Av fistula placement 2010  . Appendectomy 1980  . Mandible fracture surgery   . Angioplasty 08/09/2011    Procedure: ANGIOPLASTY;  Surgeon: Angelia Mould, MD;  Location: [facility];  Service: Vascular;  Laterality: Left;  Left Braciocephalic Fistula using Vascu-Guard Patch    Family History  Problem Relation Age of Onset  . Diabetes Father   . Heart disease Father   . Hypertension Father   . Other Father     amputation  . Diabetes Sister   . Heart disease Sister     History  Substance Use Topics  . Smoking status: Current Some Day Smoker -- 0.1 packs/day for 30 years    Types: Cigarettes  . Smokeless tobacco: Never Used     Comment: pt states that he is aware of how to quit  . Alcohol Use: 2.4 oz/week    4 Cans of beer per week     Comment: occasional      Review of Systems  Constitutional: Negative.   Skin: Positive for wound.     Allergies  Review of patient's allergies indicates no known allergies.  Home Medications   Current Outpatient Rx  Name  Route  Sig  Dispense  Refill  . ALLOPURINOL 100 MG PO TABS   Oral   Take 200 mg by mouth daily.          Marland Kitchen AMLODIPINE BESYLATE 10 MG PO TABS   Oral   Take 10 mg by mouth daily.         . ATENOLOL 50 MG PO TABS   Oral   Take 100 mg by mouth daily.          Marland Kitchen CALCIUM 500/D PO   Oral   Take 2 tablets by mouth 3 (three) times daily.          Marland Kitchen CLONIDINE HCL 0.2 MG PO TABS   Oral   Take 0.2 mg by mouth 2 (two) times daily.          . COLCHICINE 0.6 MG PO TABS   Oral   Take 0.6 mg by mouth at bedtime.         Marland Kitchen DOXAZOSIN MESYLATE 8 MG PO TABS   Oral   Take 8 mg by mouth at bedtime.         Marland Kitchen  DOXYCYCLINE HYCLATE 100 MG PO CAPS   Oral   Take 1 capsule (100 mg total) by mouth daily.   7 capsule   0   . FUROSEMIDE 40 MG PO TABS   Oral   Take 40 mg by mouth daily.         Marland Kitchen SIMVASTATIN 20 MG PO TABS   Oral   Take 20 mg by mouth at bedtime.          . SODIUM BICARBONATE 650 MG PO TABS   Oral   Take 650 mg by mouth 3 (three) times daily.           BP 136/73  Pulse 70  Temp 97.8 F (36.6 C) (Oral)  Resp 18  SpO2 97%  Physical Exam  Nursing note and vitals reviewed. Constitutional: He is oriented to person, place, and time. He appears well-developed and well-nourished.  Neurological: He is alert and oriented to person, place, and time.  Skin: Skin is warm and dry.       Packing removed from back abscess, no further drainage expressed. dsd applied.    ED Course  Procedures (including critical care time)  Labs Reviewed - No data to display No results found.   1. Abscess of back       MDM         Billy Fischer, MD 01/03/12 [phone]

## 2012-01-17 NOTE — ED Notes (Signed)
GC/Chlamydia pos.  Pt. adequately treated with Rocephin and Zithromax.  Pt. needs notified. Roselyn Meier 01/17/2012

## 2012-01-18 MED ORDER — METRONIDAZOLE 500 MG PO TABS: 500.0000 mg | ORAL_TABLET | Freq: Two times a day (BID) | ORAL | Status: DC

## 2012-01-25 ENCOUNTER — Telehealth (HOSPITAL_COMMUNITY): Payer: Self-pay | Admitting: *Deleted

## 2012-01-25 NOTE — ED Notes (Signed)
Pt. called back.  Pt. verified x 2 and given results.  Pt. told he needs Flagyl for Trich.   Pt. instructed to no alcohol while taking this medication.  Pt. said the pharmacy called him about the Rx. but he did not go pick it up. I told him if they have put the medication back to have the pharmacist call here and we will call it in again.  Pt. instructed to notify your partner to be treated with Flagyl, no sex until you have finished your medication and your partner has been treated and to practice safe sex.  Pt. voiced understanding.

## 2012-01-25 NOTE — ED Notes (Signed)
Error previous note 1/9.  GC/Chlamydia are neg.  Trich was pos.  Message sent to Dr. Denton Ar for orders.  1/10 Order obtained for Flagyl. 1/17 I called and left a message for pt. to call. Roselyn Meier 01/25/2012

## 2012-01-25 NOTE — ED Notes (Signed)
Pt. called back and said there was no record of the Rx. I asked if he was at the [facility] on [address].  He said yes. I told him I would call the Rx.   Rx. called to pharmacist @ [phone].

## 2012-02-28 ENCOUNTER — Emergency Department (HOSPITAL_COMMUNITY)
Admission: EM | Admit: 2012-02-28 | Discharge: 2012-02-28 | Disposition: A | Discharge status: Home / Self Care | Payer: Medicaid Other | Source: Home / Self Care | Attending: Emergency Medicine | Admitting: Emergency Medicine

## 2012-02-28 ENCOUNTER — Other Ambulatory Visit (HOSPITAL_COMMUNITY)
Admission: RE | Admit: 2012-02-28 | Discharge: 2012-02-28 | Disposition: A | Discharge status: Home / Self Care | Payer: Medicaid Other | Source: Ambulatory Visit | Attending: Emergency Medicine | Admitting: Emergency Medicine

## 2012-02-28 DIAGNOSIS — N489 Disorder of penis, unspecified: Secondary | ICD-10-CM

## 2012-02-28 DIAGNOSIS — Z113 Encounter for screening for infections with a predominantly sexual mode of transmission: Secondary | ICD-10-CM | POA: Insufficient documentation

## 2012-02-28 DIAGNOSIS — N4889 Other specified disorders of penis: Secondary | ICD-10-CM

## 2012-02-28 MED ORDER — METRONIDAZOLE 500 MG PO TABS: 500.0000 mg | ORAL_TABLET | Freq: Two times a day (BID) | ORAL | Status: DC

## 2012-02-28 MED ORDER — METRONIDAZOLE 500 MG PO TABS: 500.0000 mg | ORAL_TABLET | Freq: Two times a day (BID) | ORAL | Status: AC
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
## 2012-02-28 NOTE — ED Notes (Signed)
C/o his penis feels " funny."  He gets a twinge every once in awhile.  Denies burning with urination or discharge.

## 2012-02-29 ENCOUNTER — Encounter (HOSPITAL_COMMUNITY): Payer: Self-pay | Admitting: *Deleted

## 2012-06-20 ENCOUNTER — Ambulatory Visit
Admission: RE | Admit: 2012-06-20 | Discharge: 2012-06-20 | Disposition: A | Discharge status: Home / Self Care | Payer: Medicaid Other | Source: Ambulatory Visit | Attending: Nephrology | Admitting: Nephrology

## 2012-06-20 ENCOUNTER — Other Ambulatory Visit: Payer: Self-pay | Admitting: Nephrology

## 2012-06-20 DIAGNOSIS — R7611 Nonspecific reaction to tuberculin skin test without active tuberculosis: Secondary | ICD-10-CM

## 2012-07-08 DIAGNOSIS — N185 Chronic kidney disease, stage 5: Secondary | ICD-10-CM | POA: Diagnosis not present

## 2012-07-08 DIAGNOSIS — N186 End stage renal disease: Secondary | ICD-10-CM | POA: Diagnosis not present

## 2012-07-08 DIAGNOSIS — D509 Iron deficiency anemia, unspecified: Secondary | ICD-10-CM | POA: Diagnosis not present

## 2012-08-07 DIAGNOSIS — N186 End stage renal disease: Secondary | ICD-10-CM | POA: Diagnosis not present

## 2012-08-09 DIAGNOSIS — D509 Iron deficiency anemia, unspecified: Secondary | ICD-10-CM | POA: Diagnosis not present

## 2012-08-09 DIAGNOSIS — N186 End stage renal disease: Secondary | ICD-10-CM | POA: Diagnosis not present

## 2012-08-09 DIAGNOSIS — Z23 Encounter for immunization: Secondary | ICD-10-CM | POA: Diagnosis not present

## 2012-08-09 DIAGNOSIS — D631 Anemia in chronic kidney disease: Secondary | ICD-10-CM | POA: Diagnosis not present

## 2012-09-07 DIAGNOSIS — N186 End stage renal disease: Secondary | ICD-10-CM | POA: Diagnosis not present

## 2012-09-09 DIAGNOSIS — D631 Anemia in chronic kidney disease: Secondary | ICD-10-CM | POA: Diagnosis not present

## 2012-09-09 DIAGNOSIS — N186 End stage renal disease: Secondary | ICD-10-CM | POA: Diagnosis not present

## 2012-09-09 DIAGNOSIS — D509 Iron deficiency anemia, unspecified: Secondary | ICD-10-CM | POA: Diagnosis not present

## 2012-10-07 DIAGNOSIS — N186 End stage renal disease: Secondary | ICD-10-CM | POA: Diagnosis not present

## 2012-10-09 DIAGNOSIS — N186 End stage renal disease: Secondary | ICD-10-CM | POA: Diagnosis not present

## 2012-10-09 DIAGNOSIS — D631 Anemia in chronic kidney disease: Secondary | ICD-10-CM | POA: Diagnosis not present

## 2012-10-09 DIAGNOSIS — N2581 Secondary hyperparathyroidism of renal origin: Secondary | ICD-10-CM | POA: Diagnosis not present

## 2012-10-09 DIAGNOSIS — Z23 Encounter for immunization: Secondary | ICD-10-CM | POA: Diagnosis not present

## 2012-10-09 DIAGNOSIS — D509 Iron deficiency anemia, unspecified: Secondary | ICD-10-CM | POA: Diagnosis not present

## 2012-11-07 DIAGNOSIS — N186 End stage renal disease: Secondary | ICD-10-CM | POA: Diagnosis not present

## 2012-11-08 DIAGNOSIS — N186 End stage renal disease: Secondary | ICD-10-CM | POA: Diagnosis not present

## 2012-11-08 DIAGNOSIS — N2581 Secondary hyperparathyroidism of renal origin: Secondary | ICD-10-CM | POA: Diagnosis not present

## 2012-11-08 DIAGNOSIS — Z23 Encounter for immunization: Secondary | ICD-10-CM | POA: Diagnosis not present

## 2012-11-08 DIAGNOSIS — D509 Iron deficiency anemia, unspecified: Secondary | ICD-10-CM | POA: Diagnosis not present

## 2012-11-08 DIAGNOSIS — D631 Anemia in chronic kidney disease: Secondary | ICD-10-CM | POA: Diagnosis not present

## 2012-11-25 ENCOUNTER — Encounter: Payer: Self-pay | Admitting: Internal Medicine

## 2012-11-28 DIAGNOSIS — H2510 Age-related nuclear cataract, unspecified eye: Secondary | ICD-10-CM | POA: Diagnosis not present

## 2012-12-07 DIAGNOSIS — N186 End stage renal disease: Secondary | ICD-10-CM | POA: Diagnosis not present

## 2012-12-08 HISTORY — PX: INCISION AND DRAINAGE: SHX5863

## 2012-12-09 DIAGNOSIS — N186 End stage renal disease: Secondary | ICD-10-CM | POA: Diagnosis not present

## 2012-12-09 DIAGNOSIS — D509 Iron deficiency anemia, unspecified: Secondary | ICD-10-CM | POA: Diagnosis not present

## 2012-12-09 DIAGNOSIS — N2581 Secondary hyperparathyroidism of renal origin: Secondary | ICD-10-CM | POA: Diagnosis not present

## 2012-12-09 DIAGNOSIS — D631 Anemia in chronic kidney disease: Secondary | ICD-10-CM | POA: Diagnosis not present

## 2012-12-11 DIAGNOSIS — D509 Iron deficiency anemia, unspecified: Secondary | ICD-10-CM | POA: Diagnosis not present

## 2012-12-11 DIAGNOSIS — D631 Anemia in chronic kidney disease: Secondary | ICD-10-CM | POA: Diagnosis not present

## 2012-12-11 DIAGNOSIS — N2581 Secondary hyperparathyroidism of renal origin: Secondary | ICD-10-CM | POA: Diagnosis not present

## 2012-12-11 DIAGNOSIS — N186 End stage renal disease: Secondary | ICD-10-CM | POA: Diagnosis not present

## 2012-12-13 DIAGNOSIS — D631 Anemia in chronic kidney disease: Secondary | ICD-10-CM | POA: Diagnosis not present

## 2012-12-13 DIAGNOSIS — N186 End stage renal disease: Secondary | ICD-10-CM | POA: Diagnosis not present

## 2012-12-13 DIAGNOSIS — N2581 Secondary hyperparathyroidism of renal origin: Secondary | ICD-10-CM | POA: Diagnosis not present

## 2012-12-13 DIAGNOSIS — D509 Iron deficiency anemia, unspecified: Secondary | ICD-10-CM | POA: Diagnosis not present

## 2012-12-16 DIAGNOSIS — D509 Iron deficiency anemia, unspecified: Secondary | ICD-10-CM | POA: Diagnosis not present

## 2012-12-16 DIAGNOSIS — N2581 Secondary hyperparathyroidism of renal origin: Secondary | ICD-10-CM | POA: Diagnosis not present

## 2012-12-16 DIAGNOSIS — N186 End stage renal disease: Secondary | ICD-10-CM | POA: Diagnosis not present

## 2012-12-16 DIAGNOSIS — D631 Anemia in chronic kidney disease: Secondary | ICD-10-CM | POA: Diagnosis not present

## 2012-12-18 DIAGNOSIS — D631 Anemia in chronic kidney disease: Secondary | ICD-10-CM | POA: Diagnosis not present

## 2012-12-18 DIAGNOSIS — D509 Iron deficiency anemia, unspecified: Secondary | ICD-10-CM | POA: Diagnosis not present

## 2012-12-18 DIAGNOSIS — N186 End stage renal disease: Secondary | ICD-10-CM | POA: Diagnosis not present

## 2012-12-18 DIAGNOSIS — N2581 Secondary hyperparathyroidism of renal origin: Secondary | ICD-10-CM | POA: Diagnosis not present

## 2012-12-20 DIAGNOSIS — D631 Anemia in chronic kidney disease: Secondary | ICD-10-CM | POA: Diagnosis not present

## 2012-12-20 DIAGNOSIS — D509 Iron deficiency anemia, unspecified: Secondary | ICD-10-CM | POA: Diagnosis not present

## 2012-12-20 DIAGNOSIS — N186 End stage renal disease: Secondary | ICD-10-CM | POA: Diagnosis not present

## 2012-12-20 DIAGNOSIS — N2581 Secondary hyperparathyroidism of renal origin: Secondary | ICD-10-CM | POA: Diagnosis not present

## 2012-12-23 DIAGNOSIS — N2581 Secondary hyperparathyroidism of renal origin: Secondary | ICD-10-CM | POA: Diagnosis not present

## 2012-12-23 DIAGNOSIS — N186 End stage renal disease: Secondary | ICD-10-CM | POA: Diagnosis not present

## 2012-12-23 DIAGNOSIS — D631 Anemia in chronic kidney disease: Secondary | ICD-10-CM | POA: Diagnosis not present

## 2012-12-23 DIAGNOSIS — D509 Iron deficiency anemia, unspecified: Secondary | ICD-10-CM | POA: Diagnosis not present

## 2012-12-24 ENCOUNTER — Ambulatory Visit (INDEPENDENT_AMBULATORY_CARE_PROVIDER_SITE_OTHER): Payer: Self-pay | Admitting: Internal Medicine

## 2012-12-24 ENCOUNTER — Encounter: Payer: Self-pay | Admitting: Internal Medicine

## 2012-12-24 VITALS — BP 102/60 | HR 76 | Ht 72.0 in | Wt 242.0 lb

## 2012-12-24 DIAGNOSIS — Z1211 Encounter for screening for malignant neoplasm of colon: Secondary | ICD-10-CM

## 2012-12-24 MED ORDER — NA SULFATE-K SULFATE-MG SULF 17.5-3.13-1.6 GM/177ML PO SOLN: ORAL | Status: DC

## 2012-12-24 NOTE — Patient Instructions (Addendum)
You have been scheduled for a colonoscopy with propofol. Please follow written instructions given to you at your visit today.  Please pick up your prep kit at the pharmacy within the next 1-3 days. If you use inhalers (even only as needed), please bring them with you on the day of your procedure. Your physician has requested that you go to www.startemmi.com and enter the access code given to you at your visit today. This web site gives a general overview about your procedure. However, you should still follow specific instructions given to you by our office regarding your preparation for the procedure.  You may have a light breakfast the morning of prep day (the day before the procedure). You may choose from: eggs, toast, chicken noodle soup, crackers.  You should have your breakfast completed between 8:00 and 9:00 am.  Clear liquids only for the rest of the day on prep day and up until 3 hours before procedure.     I appreciate the opportunity to care for you.

## 2012-12-24 NOTE — Progress Notes (Signed)
Subjective:    Patient ID: Christopher Chapman., male    DOB: 06/21/1955, 57 y.o.   MRN: [id number]  HPI The patient is here to schedule a screening colonoscopy. He has no ative GI sxs  No Known Allergies Outpatient Prescriptions Prior to Visit  Medication Sig Dispense Refill  . allopurinol (ZYLOPRIM) 100 MG tablet Take 200 mg by mouth daily.       Marland Kitchen atenolol (TENORMIN) 50 MG tablet Take 100 mg by mouth daily.       . Calcium Carbonate-Vitamin D (CALCIUM 500/D PO) Take 2 tablets by mouth 3 (three) times daily.       . cloNIDine (CATAPRES) 0.2 MG tablet Take 0.2 mg by mouth 2 (two) times daily.       Marland Kitchen doxazosin (CARDURA) 8 MG tablet Take 8 mg by mouth at bedtime.      . simvastatin (ZOCOR) 20 MG tablet Take 20 mg by mouth at bedtime.       Marland Kitchen amLODipine (NORVASC) 10 MG tablet Take 10 mg by mouth daily.      . colchicine (COLCRYS) 0.6 MG tablet Take 0.6 mg by mouth at bedtime.      . furosemide (LASIX) 40 MG tablet Take 40 mg by mouth daily.      . metroNIDAZOLE (FLAGYL) 500 MG tablet Take 1 tablet (500 mg total) by mouth 2 (two) times daily.  14 tablet  0  . sodium bicarbonate 650 MG tablet Take 650 mg by mouth 3 (three) times daily.       No facility-administered medications prior to visit.   Past Medical History  Diagnosis Date  . CRF (chronic renal failure)   . Hypertension   . Family history of anesthesia complication     [place] going to sleep.  . Parathyroid disease     Hx of , No meds at present  . 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  . Gout   . Arthritis   . Hyperlipidemia   . Anemia   . Hyperuricemia   . MRSA (methicillin resistant staph aureus) culture positive 07/2011  . Tobacco abuse   . Hx of cocaine abuse    Past Surgical History  Procedure Laterality Date  . Av fistula placement  2010  . Appendectomy  1980  . Mandible fracture surgery    . Angioplasty  08/09/2011    Procedure: ANGIOPLASTY;  Surgeon: Angelia Mould, MD;   Location: [facility];  Service: Vascular;  Laterality: Left;  Left Braciocephalic Fistula using Vascu-Guard Patch  . Incision and drainage  12/2012    sebacebus cyst, infected   History   Social History  . Marital Status: Divorced    Spouse Name: N/A    Number of Children: N/A  . Years of Education: N/A   Social History Main Topics  . Smoking status: Current Some Day Smoker -- 0.10 packs/day for 30 years    Types: Cigarettes  . Smokeless tobacco: Never Used     Comment: pt states that he is aware of how to quit  . Alcohol Use: 2.4 oz/week    4 Cans of beer per week     Comment: occasional  . Drug Use: No  . Sexual Activity: Yes    Birth Control/ Protection: Condom   Other Topics Concern  . None   Social History Narrative  . None   Family History  Problem Relation Age of  Onset  . Diabetes Father   . Heart disease Father   . Hypertension Father   . Other Father     amputation  . Diabetes Sister   . Heart disease Sister        Review of Systems     Objective:   Physical Exam General:  NAD Eyes:   anicteric Lungs:  clear Heart:  S1S2 no rubs, murmurs or gallops Abdomen:  soft and nontender, BS+ Ext:   Left UE AV fistula     Assessment & Plan:  Special screening for malignant neoplasms, colon - Plan: Ambulatory referral to Gastroenterology  Will schedule colonoscopy. The risks and benefits as well as alternatives of endoscopic procedure(s) have been discussed and reviewed. All questions answered. The patient agrees to proceed.

## 2012-12-25 DIAGNOSIS — D509 Iron deficiency anemia, unspecified: Secondary | ICD-10-CM | POA: Diagnosis not present

## 2012-12-25 DIAGNOSIS — N2581 Secondary hyperparathyroidism of renal origin: Secondary | ICD-10-CM | POA: Diagnosis not present

## 2012-12-25 DIAGNOSIS — D631 Anemia in chronic kidney disease: Secondary | ICD-10-CM | POA: Diagnosis not present

## 2012-12-25 DIAGNOSIS — N186 End stage renal disease: Secondary | ICD-10-CM | POA: Diagnosis not present

## 2012-12-27 DIAGNOSIS — D631 Anemia in chronic kidney disease: Secondary | ICD-10-CM | POA: Diagnosis not present

## 2012-12-27 DIAGNOSIS — N2581 Secondary hyperparathyroidism of renal origin: Secondary | ICD-10-CM | POA: Diagnosis not present

## 2012-12-27 DIAGNOSIS — N186 End stage renal disease: Secondary | ICD-10-CM | POA: Diagnosis not present

## 2012-12-27 DIAGNOSIS — D509 Iron deficiency anemia, unspecified: Secondary | ICD-10-CM | POA: Diagnosis not present

## 2012-12-29 DIAGNOSIS — D509 Iron deficiency anemia, unspecified: Secondary | ICD-10-CM | POA: Diagnosis not present

## 2012-12-29 DIAGNOSIS — D631 Anemia in chronic kidney disease: Secondary | ICD-10-CM | POA: Diagnosis not present

## 2012-12-29 DIAGNOSIS — N186 End stage renal disease: Secondary | ICD-10-CM | POA: Diagnosis not present

## 2012-12-29 DIAGNOSIS — N2581 Secondary hyperparathyroidism of renal origin: Secondary | ICD-10-CM | POA: Diagnosis not present

## 2012-12-31 DIAGNOSIS — D631 Anemia in chronic kidney disease: Secondary | ICD-10-CM | POA: Diagnosis not present

## 2012-12-31 DIAGNOSIS — D509 Iron deficiency anemia, unspecified: Secondary | ICD-10-CM | POA: Diagnosis not present

## 2012-12-31 DIAGNOSIS — N2581 Secondary hyperparathyroidism of renal origin: Secondary | ICD-10-CM | POA: Diagnosis not present

## 2012-12-31 DIAGNOSIS — N186 End stage renal disease: Secondary | ICD-10-CM | POA: Diagnosis not present

## 2013-01-03 DIAGNOSIS — N2581 Secondary hyperparathyroidism of renal origin: Secondary | ICD-10-CM | POA: Diagnosis not present

## 2013-01-03 DIAGNOSIS — D509 Iron deficiency anemia, unspecified: Secondary | ICD-10-CM | POA: Diagnosis not present

## 2013-01-03 DIAGNOSIS — D631 Anemia in chronic kidney disease: Secondary | ICD-10-CM | POA: Diagnosis not present

## 2013-01-03 DIAGNOSIS — N186 End stage renal disease: Secondary | ICD-10-CM | POA: Diagnosis not present

## 2013-01-05 DIAGNOSIS — N2581 Secondary hyperparathyroidism of renal origin: Secondary | ICD-10-CM | POA: Diagnosis not present

## 2013-01-05 DIAGNOSIS — D631 Anemia in chronic kidney disease: Secondary | ICD-10-CM | POA: Diagnosis not present

## 2013-01-05 DIAGNOSIS — N186 End stage renal disease: Secondary | ICD-10-CM | POA: Diagnosis not present

## 2013-01-05 DIAGNOSIS — D509 Iron deficiency anemia, unspecified: Secondary | ICD-10-CM | POA: Diagnosis not present

## 2013-01-07 DIAGNOSIS — D631 Anemia in chronic kidney disease: Secondary | ICD-10-CM | POA: Diagnosis not present

## 2013-01-07 DIAGNOSIS — N186 End stage renal disease: Secondary | ICD-10-CM | POA: Diagnosis not present

## 2013-01-07 DIAGNOSIS — N2581 Secondary hyperparathyroidism of renal origin: Secondary | ICD-10-CM | POA: Diagnosis not present

## 2013-01-07 DIAGNOSIS — D509 Iron deficiency anemia, unspecified: Secondary | ICD-10-CM | POA: Diagnosis not present

## 2013-01-10 DIAGNOSIS — D509 Iron deficiency anemia, unspecified: Secondary | ICD-10-CM | POA: Diagnosis not present

## 2013-01-10 DIAGNOSIS — N186 End stage renal disease: Secondary | ICD-10-CM | POA: Diagnosis not present

## 2013-01-10 DIAGNOSIS — N2581 Secondary hyperparathyroidism of renal origin: Secondary | ICD-10-CM | POA: Diagnosis not present

## 2013-01-13 DIAGNOSIS — N2581 Secondary hyperparathyroidism of renal origin: Secondary | ICD-10-CM | POA: Diagnosis not present

## 2013-01-13 DIAGNOSIS — N186 End stage renal disease: Secondary | ICD-10-CM | POA: Diagnosis not present

## 2013-01-13 DIAGNOSIS — D509 Iron deficiency anemia, unspecified: Secondary | ICD-10-CM | POA: Diagnosis not present

## 2013-01-14 DIAGNOSIS — F1491 Cocaine use, unspecified, in remission: Secondary | ICD-10-CM | POA: Insufficient documentation

## 2013-01-14 DIAGNOSIS — E785 Hyperlipidemia, unspecified: Secondary | ICD-10-CM | POA: Insufficient documentation

## 2013-01-14 DIAGNOSIS — Z87898 Personal history of other specified conditions: Secondary | ICD-10-CM | POA: Insufficient documentation

## 2013-01-15 DIAGNOSIS — D509 Iron deficiency anemia, unspecified: Secondary | ICD-10-CM | POA: Diagnosis not present

## 2013-01-15 DIAGNOSIS — N186 End stage renal disease: Secondary | ICD-10-CM | POA: Diagnosis not present

## 2013-01-15 DIAGNOSIS — N2581 Secondary hyperparathyroidism of renal origin: Secondary | ICD-10-CM | POA: Diagnosis not present

## 2013-01-17 DIAGNOSIS — D509 Iron deficiency anemia, unspecified: Secondary | ICD-10-CM | POA: Diagnosis not present

## 2013-01-17 DIAGNOSIS — N186 End stage renal disease: Secondary | ICD-10-CM | POA: Diagnosis not present

## 2013-01-17 DIAGNOSIS — N2581 Secondary hyperparathyroidism of renal origin: Secondary | ICD-10-CM | POA: Diagnosis not present

## 2013-01-20 DIAGNOSIS — N2581 Secondary hyperparathyroidism of renal origin: Secondary | ICD-10-CM | POA: Diagnosis not present

## 2013-01-20 DIAGNOSIS — D509 Iron deficiency anemia, unspecified: Secondary | ICD-10-CM | POA: Diagnosis not present

## 2013-01-20 DIAGNOSIS — N186 End stage renal disease: Secondary | ICD-10-CM | POA: Diagnosis not present

## 2013-01-22 DIAGNOSIS — N186 End stage renal disease: Secondary | ICD-10-CM | POA: Diagnosis not present

## 2013-01-22 DIAGNOSIS — N2581 Secondary hyperparathyroidism of renal origin: Secondary | ICD-10-CM | POA: Diagnosis not present

## 2013-01-22 DIAGNOSIS — D509 Iron deficiency anemia, unspecified: Secondary | ICD-10-CM | POA: Diagnosis not present

## 2013-01-24 DIAGNOSIS — N2581 Secondary hyperparathyroidism of renal origin: Secondary | ICD-10-CM | POA: Diagnosis not present

## 2013-01-24 DIAGNOSIS — N186 End stage renal disease: Secondary | ICD-10-CM | POA: Diagnosis not present

## 2013-01-24 DIAGNOSIS — D509 Iron deficiency anemia, unspecified: Secondary | ICD-10-CM | POA: Diagnosis not present

## 2013-01-26 ENCOUNTER — Telehealth: Payer: Self-pay | Admitting: Internal Medicine

## 2013-01-26 ENCOUNTER — Encounter: Payer: Medicare Other | Admitting: Internal Medicine

## 2013-01-26 NOTE — Telephone Encounter (Signed)
No charge. 

## 2013-01-27 DIAGNOSIS — N186 End stage renal disease: Secondary | ICD-10-CM | POA: Diagnosis not present

## 2013-01-27 DIAGNOSIS — N2581 Secondary hyperparathyroidism of renal origin: Secondary | ICD-10-CM | POA: Diagnosis not present

## 2013-01-27 DIAGNOSIS — D509 Iron deficiency anemia, unspecified: Secondary | ICD-10-CM | POA: Diagnosis not present

## 2013-01-29 DIAGNOSIS — N186 End stage renal disease: Secondary | ICD-10-CM | POA: Diagnosis not present

## 2013-01-29 DIAGNOSIS — N2581 Secondary hyperparathyroidism of renal origin: Secondary | ICD-10-CM | POA: Diagnosis not present

## 2013-01-29 DIAGNOSIS — D509 Iron deficiency anemia, unspecified: Secondary | ICD-10-CM | POA: Diagnosis not present

## 2013-01-31 DIAGNOSIS — N2581 Secondary hyperparathyroidism of renal origin: Secondary | ICD-10-CM | POA: Diagnosis not present

## 2013-01-31 DIAGNOSIS — D509 Iron deficiency anemia, unspecified: Secondary | ICD-10-CM | POA: Diagnosis not present

## 2013-01-31 DIAGNOSIS — N186 End stage renal disease: Secondary | ICD-10-CM | POA: Diagnosis not present

## 2013-02-02 ENCOUNTER — Encounter: Payer: Self-pay | Admitting: Internal Medicine

## 2013-02-02 ENCOUNTER — Telehealth: Payer: Self-pay | Admitting: Internal Medicine

## 2013-02-02 ENCOUNTER — Encounter: Payer: Medicare Other | Admitting: Internal Medicine

## 2013-02-02 NOTE — Telephone Encounter (Signed)
no

## 2013-02-03 DIAGNOSIS — D509 Iron deficiency anemia, unspecified: Secondary | ICD-10-CM | POA: Diagnosis not present

## 2013-02-03 DIAGNOSIS — N2581 Secondary hyperparathyroidism of renal origin: Secondary | ICD-10-CM | POA: Diagnosis not present

## 2013-02-03 DIAGNOSIS — N186 End stage renal disease: Secondary | ICD-10-CM | POA: Diagnosis not present

## 2013-02-05 DIAGNOSIS — N2581 Secondary hyperparathyroidism of renal origin: Secondary | ICD-10-CM | POA: Diagnosis not present

## 2013-02-05 DIAGNOSIS — D509 Iron deficiency anemia, unspecified: Secondary | ICD-10-CM | POA: Diagnosis not present

## 2013-02-05 DIAGNOSIS — N186 End stage renal disease: Secondary | ICD-10-CM | POA: Diagnosis not present

## 2013-02-07 DIAGNOSIS — N2581 Secondary hyperparathyroidism of renal origin: Secondary | ICD-10-CM | POA: Diagnosis not present

## 2013-02-07 DIAGNOSIS — N186 End stage renal disease: Secondary | ICD-10-CM | POA: Diagnosis not present

## 2013-02-07 DIAGNOSIS — D509 Iron deficiency anemia, unspecified: Secondary | ICD-10-CM | POA: Diagnosis not present

## 2013-02-10 DIAGNOSIS — N186 End stage renal disease: Secondary | ICD-10-CM | POA: Diagnosis not present

## 2013-02-10 DIAGNOSIS — N2581 Secondary hyperparathyroidism of renal origin: Secondary | ICD-10-CM | POA: Diagnosis not present

## 2013-02-10 DIAGNOSIS — D509 Iron deficiency anemia, unspecified: Secondary | ICD-10-CM | POA: Diagnosis not present

## 2013-02-11 ENCOUNTER — Ambulatory Visit (AMBULATORY_SURGERY_CENTER): Payer: Self-pay | Admitting: *Deleted

## 2013-02-11 VITALS — Ht 72.0 in | Wt 240.6 lb

## 2013-02-11 DIAGNOSIS — Z1211 Encounter for screening for malignant neoplasm of colon: Secondary | ICD-10-CM

## 2013-02-11 MED ORDER — MOVIPREP 100 G PO SOLR: ORAL | Status: DC

## 2013-02-11 NOTE — Progress Notes (Signed)
No allergies to eggs or soy. No problems with anesthesia.  

## 2013-02-23 ENCOUNTER — Encounter (HOSPITAL_COMMUNITY): Payer: Self-pay | Admitting: Emergency Medicine

## 2013-02-23 ENCOUNTER — Emergency Department (INDEPENDENT_AMBULATORY_CARE_PROVIDER_SITE_OTHER)
Admission: EM | Admit: 2013-02-23 | Discharge: 2013-02-23 | Disposition: A | Discharge status: Home / Self Care | Payer: Medicare Other | Source: Home / Self Care | Attending: Family Medicine | Admitting: Family Medicine

## 2013-02-23 DIAGNOSIS — L089 Local infection of the skin and subcutaneous tissue, unspecified: Secondary | ICD-10-CM | POA: Diagnosis not present

## 2013-02-23 NOTE — Discharge Instructions (Signed)
Wound Infection  A wound infection happens when a type of germ (bacteria) starts growing in the wound. In some cases, this can cause the wound to break open. If cared for properly, the infected wound will heal from the inside to the outside. Wound infections need treatment.  CAUSES  An infection is caused by bacteria growing in the wound.   SYMPTOMS    Increase in redness, swelling, or pain at the wound site.   Increase in drainage at the wound site.   Wound or bandage (dressing) starts to smell bad.   Fever.   Feeling tired or fatigued.   Pus draining from the wound.  TREATMENT   You caregiver will prescribe antibiotic medicine. The wound infection should improve within 24 to 48 hours. Any redness around the wound should stop spreading and the wound should be less painful.   HOME CARE INSTRUCTIONS    Only take over-the-counter or prescription medicines for pain, discomfort, or fever as directed by your caregiver.   Take your antibiotics as directed. Finish them even if you start to feel better.   Gently wash the area with mild soap and water 2 times a day, or as directed. Rinse off the soap. Pat the area dry with a clean towel. Do not rub the wound. This may cause bleeding.   Follow your caregiver's instructions for how often you need to change the dressing.   Apply ointment and a dressing to the wound as directed.   If the dressing sticks, moisten it with soapy water and gently remove it.   Change the bandage right away if it becomes wet, dirty, or develops a bad smell.   Take showers. Do not take tub baths, swim, or do anything that may soak the wound until it is healed.   Avoid exercises that make you sweat heavily.   Use anti-itch medicine as directed by your caregiver. The wound may itch when it is healing. Do not pick or scratch at the wound.   Follow up with your caregiver to get your wound rechecked as directed.  SEEK MEDICAL CARE IF:   You have an increase in swelling, pain, or redness  around the wound.   You have an increase in the amount of pus coming from the wound.   There is a bad smell coming from the wound.   More of the wound breaks open.   You have a fever.  MAKE SURE YOU:    Understand these instructions.   Will watch your condition.   Will get help right away if you are not doing well or get worse.  Document Released: 09/23/2002 Document Revised: 03/19/2011 Document Reviewed: 04/30/2010  [employer] Patient Information 2014 [employer]
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
## 2013-02-24 NOTE — ED Provider Notes (Signed)
Medical screening examination/treatment/procedure(s) were performed by non-physician practitioner and as supervising physician I was immediately available for consultation/collaboration.  Philipp Deputy, M.D.  Harden Mo, MD 02/24/13 1434

## 2013-02-25 ENCOUNTER — Encounter: Payer: Medicare Other | Admitting: Internal Medicine

## 2013-03-07 DIAGNOSIS — N186 End stage renal disease: Secondary | ICD-10-CM | POA: Diagnosis not present

## 2013-03-10 DIAGNOSIS — N186 End stage renal disease: Secondary | ICD-10-CM | POA: Diagnosis not present

## 2013-03-10 DIAGNOSIS — D631 Anemia in chronic kidney disease: Secondary | ICD-10-CM | POA: Diagnosis not present

## 2013-03-10 DIAGNOSIS — D509 Iron deficiency anemia, unspecified: Secondary | ICD-10-CM | POA: Diagnosis not present

## 2013-03-10 DIAGNOSIS — N2581 Secondary hyperparathyroidism of renal origin: Secondary | ICD-10-CM | POA: Diagnosis not present

## 2013-03-27 DIAGNOSIS — N186 End stage renal disease: Secondary | ICD-10-CM | POA: Diagnosis not present

## 2013-03-27 DIAGNOSIS — I871 Compression of vein: Secondary | ICD-10-CM | POA: Diagnosis not present

## 2013-03-27 DIAGNOSIS — T82898A Other specified complication of vascular prosthetic devices, implants and grafts, initial encounter: Secondary | ICD-10-CM | POA: Diagnosis not present

## 2013-04-07 DIAGNOSIS — N186 End stage renal disease: Secondary | ICD-10-CM | POA: Diagnosis not present

## 2013-04-09 DIAGNOSIS — N186 End stage renal disease: Secondary | ICD-10-CM | POA: Diagnosis not present

## 2013-04-09 DIAGNOSIS — D509 Iron deficiency anemia, unspecified: Secondary | ICD-10-CM | POA: Diagnosis not present

## 2013-05-07 DIAGNOSIS — N186 End stage renal disease: Secondary | ICD-10-CM | POA: Diagnosis not present

## 2013-05-09 DIAGNOSIS — N186 End stage renal disease: Secondary | ICD-10-CM | POA: Diagnosis not present

## 2013-06-07 DIAGNOSIS — N186 End stage renal disease: Secondary | ICD-10-CM | POA: Diagnosis not present

## 2013-06-09 DIAGNOSIS — D631 Anemia in chronic kidney disease: Secondary | ICD-10-CM | POA: Diagnosis not present

## 2013-06-09 DIAGNOSIS — N186 End stage renal disease: Secondary | ICD-10-CM | POA: Diagnosis not present

## 2013-06-09 DIAGNOSIS — N2581 Secondary hyperparathyroidism of renal origin: Secondary | ICD-10-CM | POA: Diagnosis not present

## 2013-06-09 DIAGNOSIS — D509 Iron deficiency anemia, unspecified: Secondary | ICD-10-CM | POA: Diagnosis not present

## 2013-06-11 DIAGNOSIS — D509 Iron deficiency anemia, unspecified: Secondary | ICD-10-CM | POA: Diagnosis not present

## 2013-06-11 DIAGNOSIS — N039 Chronic nephritic syndrome with unspecified morphologic changes: Secondary | ICD-10-CM | POA: Diagnosis not present

## 2013-06-11 DIAGNOSIS — N2581 Secondary hyperparathyroidism of renal origin: Secondary | ICD-10-CM | POA: Diagnosis not present

## 2013-06-11 DIAGNOSIS — N186 End stage renal disease: Secondary | ICD-10-CM | POA: Diagnosis not present

## 2013-06-11 DIAGNOSIS — D631 Anemia in chronic kidney disease: Secondary | ICD-10-CM | POA: Diagnosis not present

## 2013-06-13 DIAGNOSIS — D509 Iron deficiency anemia, unspecified: Secondary | ICD-10-CM | POA: Diagnosis not present

## 2013-06-13 DIAGNOSIS — N186 End stage renal disease: Secondary | ICD-10-CM | POA: Diagnosis not present

## 2013-06-13 DIAGNOSIS — D631 Anemia in chronic kidney disease: Secondary | ICD-10-CM | POA: Diagnosis not present

## 2013-06-13 DIAGNOSIS — N039 Chronic nephritic syndrome with unspecified morphologic changes: Secondary | ICD-10-CM | POA: Diagnosis not present

## 2013-06-13 DIAGNOSIS — N2581 Secondary hyperparathyroidism of renal origin: Secondary | ICD-10-CM | POA: Diagnosis not present

## 2013-06-18 DIAGNOSIS — D509 Iron deficiency anemia, unspecified: Secondary | ICD-10-CM | POA: Diagnosis not present

## 2013-06-18 DIAGNOSIS — N2581 Secondary hyperparathyroidism of renal origin: Secondary | ICD-10-CM | POA: Diagnosis not present

## 2013-06-18 DIAGNOSIS — D631 Anemia in chronic kidney disease: Secondary | ICD-10-CM | POA: Diagnosis not present

## 2013-06-18 DIAGNOSIS — N186 End stage renal disease: Secondary | ICD-10-CM | POA: Diagnosis not present

## 2013-06-18 DIAGNOSIS — N039 Chronic nephritic syndrome with unspecified morphologic changes: Secondary | ICD-10-CM | POA: Diagnosis not present

## 2013-06-20 DIAGNOSIS — N039 Chronic nephritic syndrome with unspecified morphologic changes: Secondary | ICD-10-CM | POA: Diagnosis not present

## 2013-06-20 DIAGNOSIS — D631 Anemia in chronic kidney disease: Secondary | ICD-10-CM | POA: Diagnosis not present

## 2013-06-20 DIAGNOSIS — N2581 Secondary hyperparathyroidism of renal origin: Secondary | ICD-10-CM | POA: Diagnosis not present

## 2013-06-20 DIAGNOSIS — N186 End stage renal disease: Secondary | ICD-10-CM | POA: Diagnosis not present

## 2013-06-20 DIAGNOSIS — D509 Iron deficiency anemia, unspecified: Secondary | ICD-10-CM | POA: Diagnosis not present

## 2013-06-23 DIAGNOSIS — N2581 Secondary hyperparathyroidism of renal origin: Secondary | ICD-10-CM | POA: Diagnosis not present

## 2013-06-23 DIAGNOSIS — D631 Anemia in chronic kidney disease: Secondary | ICD-10-CM | POA: Diagnosis not present

## 2013-06-23 DIAGNOSIS — D509 Iron deficiency anemia, unspecified: Secondary | ICD-10-CM | POA: Diagnosis not present

## 2013-06-23 DIAGNOSIS — N039 Chronic nephritic syndrome with unspecified morphologic changes: Secondary | ICD-10-CM | POA: Diagnosis not present

## 2013-06-23 DIAGNOSIS — N186 End stage renal disease: Secondary | ICD-10-CM | POA: Diagnosis not present

## 2013-06-25 DIAGNOSIS — N186 End stage renal disease: Secondary | ICD-10-CM | POA: Diagnosis not present

## 2013-06-25 DIAGNOSIS — D631 Anemia in chronic kidney disease: Secondary | ICD-10-CM | POA: Diagnosis not present

## 2013-06-25 DIAGNOSIS — N2581 Secondary hyperparathyroidism of renal origin: Secondary | ICD-10-CM | POA: Diagnosis not present

## 2013-06-25 DIAGNOSIS — D509 Iron deficiency anemia, unspecified: Secondary | ICD-10-CM | POA: Diagnosis not present

## 2013-06-27 DIAGNOSIS — N039 Chronic nephritic syndrome with unspecified morphologic changes: Secondary | ICD-10-CM | POA: Diagnosis not present

## 2013-06-27 DIAGNOSIS — D631 Anemia in chronic kidney disease: Secondary | ICD-10-CM | POA: Diagnosis not present

## 2013-06-27 DIAGNOSIS — D509 Iron deficiency anemia, unspecified: Secondary | ICD-10-CM | POA: Diagnosis not present

## 2013-06-27 DIAGNOSIS — N2581 Secondary hyperparathyroidism of renal origin: Secondary | ICD-10-CM | POA: Diagnosis not present

## 2013-06-27 DIAGNOSIS — N186 End stage renal disease: Secondary | ICD-10-CM | POA: Diagnosis not present

## 2013-06-30 DIAGNOSIS — D631 Anemia in chronic kidney disease: Secondary | ICD-10-CM | POA: Diagnosis not present

## 2013-06-30 DIAGNOSIS — N2581 Secondary hyperparathyroidism of renal origin: Secondary | ICD-10-CM | POA: Diagnosis not present

## 2013-06-30 DIAGNOSIS — D509 Iron deficiency anemia, unspecified: Secondary | ICD-10-CM | POA: Diagnosis not present

## 2013-06-30 DIAGNOSIS — N186 End stage renal disease: Secondary | ICD-10-CM | POA: Diagnosis not present

## 2013-07-02 DIAGNOSIS — N2581 Secondary hyperparathyroidism of renal origin: Secondary | ICD-10-CM | POA: Diagnosis not present

## 2013-07-02 DIAGNOSIS — D509 Iron deficiency anemia, unspecified: Secondary | ICD-10-CM | POA: Diagnosis not present

## 2013-07-02 DIAGNOSIS — N186 End stage renal disease: Secondary | ICD-10-CM | POA: Diagnosis not present

## 2013-07-02 DIAGNOSIS — D631 Anemia in chronic kidney disease: Secondary | ICD-10-CM | POA: Diagnosis not present

## 2013-07-04 DIAGNOSIS — N186 End stage renal disease: Secondary | ICD-10-CM | POA: Diagnosis not present

## 2013-07-04 DIAGNOSIS — D509 Iron deficiency anemia, unspecified: Secondary | ICD-10-CM | POA: Diagnosis not present

## 2013-07-04 DIAGNOSIS — D631 Anemia in chronic kidney disease: Secondary | ICD-10-CM | POA: Diagnosis not present

## 2013-07-04 DIAGNOSIS — N2581 Secondary hyperparathyroidism of renal origin: Secondary | ICD-10-CM | POA: Diagnosis not present

## 2013-07-04 DIAGNOSIS — N039 Chronic nephritic syndrome with unspecified morphologic changes: Secondary | ICD-10-CM | POA: Diagnosis not present

## 2013-07-07 DIAGNOSIS — N186 End stage renal disease: Secondary | ICD-10-CM | POA: Diagnosis not present

## 2013-07-07 DIAGNOSIS — D631 Anemia in chronic kidney disease: Secondary | ICD-10-CM | POA: Diagnosis not present

## 2013-07-07 DIAGNOSIS — D509 Iron deficiency anemia, unspecified: Secondary | ICD-10-CM | POA: Diagnosis not present

## 2013-07-07 DIAGNOSIS — N039 Chronic nephritic syndrome with unspecified morphologic changes: Secondary | ICD-10-CM | POA: Diagnosis not present

## 2013-07-07 DIAGNOSIS — N2581 Secondary hyperparathyroidism of renal origin: Secondary | ICD-10-CM | POA: Diagnosis not present

## 2013-07-09 DIAGNOSIS — D631 Anemia in chronic kidney disease: Secondary | ICD-10-CM | POA: Diagnosis not present

## 2013-07-09 DIAGNOSIS — N2581 Secondary hyperparathyroidism of renal origin: Secondary | ICD-10-CM | POA: Diagnosis not present

## 2013-07-09 DIAGNOSIS — N186 End stage renal disease: Secondary | ICD-10-CM | POA: Diagnosis not present

## 2013-07-09 DIAGNOSIS — D509 Iron deficiency anemia, unspecified: Secondary | ICD-10-CM | POA: Diagnosis not present

## 2013-08-07 DIAGNOSIS — N186 End stage renal disease: Secondary | ICD-10-CM | POA: Diagnosis not present

## 2013-08-08 DIAGNOSIS — N2581 Secondary hyperparathyroidism of renal origin: Secondary | ICD-10-CM | POA: Diagnosis not present

## 2013-08-08 DIAGNOSIS — N186 End stage renal disease: Secondary | ICD-10-CM | POA: Diagnosis not present

## 2013-08-08 DIAGNOSIS — D509 Iron deficiency anemia, unspecified: Secondary | ICD-10-CM | POA: Diagnosis not present

## 2013-09-07 DIAGNOSIS — N186 End stage renal disease: Secondary | ICD-10-CM | POA: Diagnosis not present

## 2013-09-08 DIAGNOSIS — D509 Iron deficiency anemia, unspecified: Secondary | ICD-10-CM | POA: Diagnosis not present

## 2013-09-08 DIAGNOSIS — N186 End stage renal disease: Secondary | ICD-10-CM | POA: Diagnosis not present

## 2013-09-08 DIAGNOSIS — N2581 Secondary hyperparathyroidism of renal origin: Secondary | ICD-10-CM | POA: Diagnosis not present

## 2013-10-08 DIAGNOSIS — Z23 Encounter for immunization: Secondary | ICD-10-CM | POA: Diagnosis not present

## 2013-10-08 DIAGNOSIS — N186 End stage renal disease: Secondary | ICD-10-CM | POA: Diagnosis not present

## 2013-10-08 DIAGNOSIS — D509 Iron deficiency anemia, unspecified: Secondary | ICD-10-CM | POA: Diagnosis not present

## 2013-10-08 DIAGNOSIS — N2581 Secondary hyperparathyroidism of renal origin: Secondary | ICD-10-CM | POA: Diagnosis not present

## 2013-11-07 DIAGNOSIS — Z992 Dependence on renal dialysis: Secondary | ICD-10-CM | POA: Diagnosis not present

## 2013-11-07 DIAGNOSIS — N186 End stage renal disease: Secondary | ICD-10-CM | POA: Diagnosis not present

## 2013-11-10 DIAGNOSIS — N2581 Secondary hyperparathyroidism of renal origin: Secondary | ICD-10-CM | POA: Diagnosis not present

## 2013-11-10 DIAGNOSIS — N186 End stage renal disease: Secondary | ICD-10-CM | POA: Diagnosis not present

## 2013-12-07 DIAGNOSIS — N186 End stage renal disease: Secondary | ICD-10-CM | POA: Diagnosis not present

## 2013-12-07 DIAGNOSIS — Z992 Dependence on renal dialysis: Secondary | ICD-10-CM | POA: Diagnosis not present

## 2013-12-08 DIAGNOSIS — N2581 Secondary hyperparathyroidism of renal origin: Secondary | ICD-10-CM | POA: Diagnosis not present

## 2013-12-08 DIAGNOSIS — N186 End stage renal disease: Secondary | ICD-10-CM | POA: Diagnosis not present

## 2013-12-10 DIAGNOSIS — N186 End stage renal disease: Secondary | ICD-10-CM | POA: Diagnosis not present

## 2013-12-10 DIAGNOSIS — N2581 Secondary hyperparathyroidism of renal origin: Secondary | ICD-10-CM | POA: Diagnosis not present

## 2013-12-12 DIAGNOSIS — N2581 Secondary hyperparathyroidism of renal origin: Secondary | ICD-10-CM | POA: Diagnosis not present

## 2013-12-12 DIAGNOSIS — N186 End stage renal disease: Secondary | ICD-10-CM | POA: Diagnosis not present

## 2013-12-15 DIAGNOSIS — N2581 Secondary hyperparathyroidism of renal origin: Secondary | ICD-10-CM | POA: Diagnosis not present

## 2013-12-15 DIAGNOSIS — N186 End stage renal disease: Secondary | ICD-10-CM | POA: Diagnosis not present

## 2013-12-17 ENCOUNTER — Encounter (HOSPITAL_COMMUNITY): Payer: Self-pay | Admitting: Vascular Surgery

## 2013-12-17 DIAGNOSIS — N186 End stage renal disease: Secondary | ICD-10-CM | POA: Diagnosis not present

## 2013-12-17 DIAGNOSIS — N2581 Secondary hyperparathyroidism of renal origin: Secondary | ICD-10-CM | POA: Diagnosis not present

## 2013-12-19 DIAGNOSIS — N186 End stage renal disease: Secondary | ICD-10-CM | POA: Diagnosis not present

## 2013-12-19 DIAGNOSIS — N2581 Secondary hyperparathyroidism of renal origin: Secondary | ICD-10-CM | POA: Diagnosis not present

## 2013-12-22 DIAGNOSIS — N2581 Secondary hyperparathyroidism of renal origin: Secondary | ICD-10-CM | POA: Diagnosis not present

## 2013-12-22 DIAGNOSIS — N186 End stage renal disease: Secondary | ICD-10-CM | POA: Diagnosis not present

## 2013-12-24 DIAGNOSIS — N186 End stage renal disease: Secondary | ICD-10-CM | POA: Diagnosis not present

## 2013-12-24 DIAGNOSIS — N2581 Secondary hyperparathyroidism of renal origin: Secondary | ICD-10-CM | POA: Diagnosis not present

## 2013-12-26 DIAGNOSIS — N186 End stage renal disease: Secondary | ICD-10-CM | POA: Diagnosis not present

## 2013-12-26 DIAGNOSIS — N2581 Secondary hyperparathyroidism of renal origin: Secondary | ICD-10-CM | POA: Diagnosis not present

## 2013-12-29 DIAGNOSIS — N186 End stage renal disease: Secondary | ICD-10-CM | POA: Diagnosis not present

## 2013-12-29 DIAGNOSIS — N2581 Secondary hyperparathyroidism of renal origin: Secondary | ICD-10-CM | POA: Diagnosis not present

## 2013-12-31 DIAGNOSIS — N186 End stage renal disease: Secondary | ICD-10-CM | POA: Diagnosis not present

## 2013-12-31 DIAGNOSIS — N2581 Secondary hyperparathyroidism of renal origin: Secondary | ICD-10-CM | POA: Diagnosis not present

## 2014-01-03 DIAGNOSIS — N2581 Secondary hyperparathyroidism of renal origin: Secondary | ICD-10-CM | POA: Diagnosis not present

## 2014-01-03 DIAGNOSIS — N186 End stage renal disease: Secondary | ICD-10-CM | POA: Diagnosis not present

## 2014-01-05 DIAGNOSIS — N2581 Secondary hyperparathyroidism of renal origin: Secondary | ICD-10-CM | POA: Diagnosis not present

## 2014-01-05 DIAGNOSIS — N186 End stage renal disease: Secondary | ICD-10-CM | POA: Diagnosis not present

## 2014-01-07 DIAGNOSIS — Z992 Dependence on renal dialysis: Secondary | ICD-10-CM | POA: Diagnosis not present

## 2014-01-07 DIAGNOSIS — N186 End stage renal disease: Secondary | ICD-10-CM | POA: Diagnosis not present

## 2014-01-07 DIAGNOSIS — N2581 Secondary hyperparathyroidism of renal origin: Secondary | ICD-10-CM | POA: Diagnosis not present

## 2014-01-10 DIAGNOSIS — N2581 Secondary hyperparathyroidism of renal origin: Secondary | ICD-10-CM | POA: Diagnosis not present

## 2014-01-10 DIAGNOSIS — N186 End stage renal disease: Secondary | ICD-10-CM | POA: Diagnosis not present

## 2014-01-12 DIAGNOSIS — N2581 Secondary hyperparathyroidism of renal origin: Secondary | ICD-10-CM | POA: Diagnosis not present

## 2014-01-12 DIAGNOSIS — N186 End stage renal disease: Secondary | ICD-10-CM | POA: Diagnosis not present

## 2014-01-14 DIAGNOSIS — N186 End stage renal disease: Secondary | ICD-10-CM | POA: Diagnosis not present

## 2014-01-14 DIAGNOSIS — N2581 Secondary hyperparathyroidism of renal origin: Secondary | ICD-10-CM | POA: Diagnosis not present

## 2014-01-16 DIAGNOSIS — N2581 Secondary hyperparathyroidism of renal origin: Secondary | ICD-10-CM | POA: Diagnosis not present

## 2014-01-16 DIAGNOSIS — N186 End stage renal disease: Secondary | ICD-10-CM | POA: Diagnosis not present

## 2014-01-19 DIAGNOSIS — N2581 Secondary hyperparathyroidism of renal origin: Secondary | ICD-10-CM | POA: Diagnosis not present

## 2014-01-19 DIAGNOSIS — N186 End stage renal disease: Secondary | ICD-10-CM | POA: Diagnosis not present

## 2014-01-21 ENCOUNTER — Encounter (HOSPITAL_COMMUNITY): Payer: Self-pay | Admitting: Vascular Surgery

## 2014-01-21 DIAGNOSIS — N2581 Secondary hyperparathyroidism of renal origin: Secondary | ICD-10-CM | POA: Diagnosis not present

## 2014-01-21 DIAGNOSIS — N186 End stage renal disease: Secondary | ICD-10-CM | POA: Diagnosis not present

## 2014-01-23 DIAGNOSIS — N2581 Secondary hyperparathyroidism of renal origin: Secondary | ICD-10-CM | POA: Diagnosis not present

## 2014-01-23 DIAGNOSIS — N186 End stage renal disease: Secondary | ICD-10-CM | POA: Diagnosis not present

## 2014-01-26 DIAGNOSIS — N186 End stage renal disease: Secondary | ICD-10-CM | POA: Diagnosis not present

## 2014-01-26 DIAGNOSIS — N2581 Secondary hyperparathyroidism of renal origin: Secondary | ICD-10-CM | POA: Diagnosis not present

## 2014-01-28 DIAGNOSIS — N186 End stage renal disease: Secondary | ICD-10-CM | POA: Diagnosis not present

## 2014-01-28 DIAGNOSIS — N2581 Secondary hyperparathyroidism of renal origin: Secondary | ICD-10-CM | POA: Diagnosis not present

## 2014-01-30 DIAGNOSIS — N2581 Secondary hyperparathyroidism of renal origin: Secondary | ICD-10-CM | POA: Diagnosis not present

## 2014-01-30 DIAGNOSIS — N186 End stage renal disease: Secondary | ICD-10-CM | POA: Diagnosis not present

## 2014-02-02 DIAGNOSIS — N186 End stage renal disease: Secondary | ICD-10-CM | POA: Diagnosis not present

## 2014-02-02 DIAGNOSIS — N2581 Secondary hyperparathyroidism of renal origin: Secondary | ICD-10-CM | POA: Diagnosis not present

## 2014-02-04 DIAGNOSIS — N2581 Secondary hyperparathyroidism of renal origin: Secondary | ICD-10-CM | POA: Diagnosis not present

## 2014-02-04 DIAGNOSIS — N186 End stage renal disease: Secondary | ICD-10-CM | POA: Diagnosis not present

## 2014-02-06 DIAGNOSIS — N2581 Secondary hyperparathyroidism of renal origin: Secondary | ICD-10-CM | POA: Diagnosis not present

## 2014-02-06 DIAGNOSIS — N186 End stage renal disease: Secondary | ICD-10-CM | POA: Diagnosis not present

## 2014-02-07 DIAGNOSIS — N186 End stage renal disease: Secondary | ICD-10-CM | POA: Diagnosis not present

## 2014-02-09 DIAGNOSIS — N2581 Secondary hyperparathyroidism of renal origin: Secondary | ICD-10-CM | POA: Diagnosis not present

## 2014-02-09 DIAGNOSIS — N186 End stage renal disease: Secondary | ICD-10-CM | POA: Diagnosis not present

## 2014-02-09 DIAGNOSIS — D509 Iron deficiency anemia, unspecified: Secondary | ICD-10-CM | POA: Diagnosis not present

## 2014-02-11 DIAGNOSIS — N2581 Secondary hyperparathyroidism of renal origin: Secondary | ICD-10-CM | POA: Diagnosis not present

## 2014-02-11 DIAGNOSIS — N186 End stage renal disease: Secondary | ICD-10-CM | POA: Diagnosis not present

## 2014-02-11 DIAGNOSIS — D509 Iron deficiency anemia, unspecified: Secondary | ICD-10-CM | POA: Diagnosis not present

## 2014-02-13 DIAGNOSIS — N186 End stage renal disease: Secondary | ICD-10-CM | POA: Diagnosis not present

## 2014-02-13 DIAGNOSIS — D509 Iron deficiency anemia, unspecified: Secondary | ICD-10-CM | POA: Diagnosis not present

## 2014-02-13 DIAGNOSIS — N2581 Secondary hyperparathyroidism of renal origin: Secondary | ICD-10-CM | POA: Diagnosis not present

## 2014-02-16 DIAGNOSIS — D509 Iron deficiency anemia, unspecified: Secondary | ICD-10-CM | POA: Diagnosis not present

## 2014-02-16 DIAGNOSIS — N186 End stage renal disease: Secondary | ICD-10-CM | POA: Diagnosis not present

## 2014-02-16 DIAGNOSIS — N2581 Secondary hyperparathyroidism of renal origin: Secondary | ICD-10-CM | POA: Diagnosis not present

## 2014-02-18 DIAGNOSIS — D509 Iron deficiency anemia, unspecified: Secondary | ICD-10-CM | POA: Diagnosis not present

## 2014-02-18 DIAGNOSIS — N2581 Secondary hyperparathyroidism of renal origin: Secondary | ICD-10-CM | POA: Diagnosis not present

## 2014-02-18 DIAGNOSIS — N186 End stage renal disease: Secondary | ICD-10-CM | POA: Diagnosis not present

## 2014-02-20 DIAGNOSIS — N2581 Secondary hyperparathyroidism of renal origin: Secondary | ICD-10-CM | POA: Diagnosis not present

## 2014-02-20 DIAGNOSIS — D509 Iron deficiency anemia, unspecified: Secondary | ICD-10-CM | POA: Diagnosis not present

## 2014-02-20 DIAGNOSIS — N186 End stage renal disease: Secondary | ICD-10-CM | POA: Diagnosis not present

## 2014-02-23 DIAGNOSIS — D509 Iron deficiency anemia, unspecified: Secondary | ICD-10-CM | POA: Diagnosis not present

## 2014-02-23 DIAGNOSIS — N186 End stage renal disease: Secondary | ICD-10-CM | POA: Diagnosis not present

## 2014-02-23 DIAGNOSIS — N2581 Secondary hyperparathyroidism of renal origin: Secondary | ICD-10-CM | POA: Diagnosis not present

## 2014-02-25 DIAGNOSIS — D509 Iron deficiency anemia, unspecified: Secondary | ICD-10-CM | POA: Diagnosis not present

## 2014-02-25 DIAGNOSIS — N2581 Secondary hyperparathyroidism of renal origin: Secondary | ICD-10-CM | POA: Diagnosis not present

## 2014-02-25 DIAGNOSIS — N186 End stage renal disease: Secondary | ICD-10-CM | POA: Diagnosis not present

## 2014-02-27 DIAGNOSIS — N2581 Secondary hyperparathyroidism of renal origin: Secondary | ICD-10-CM | POA: Diagnosis not present

## 2014-02-27 DIAGNOSIS — D509 Iron deficiency anemia, unspecified: Secondary | ICD-10-CM | POA: Diagnosis not present

## 2014-02-27 DIAGNOSIS — N186 End stage renal disease: Secondary | ICD-10-CM | POA: Diagnosis not present

## 2014-03-02 DIAGNOSIS — D509 Iron deficiency anemia, unspecified: Secondary | ICD-10-CM | POA: Diagnosis not present

## 2014-03-02 DIAGNOSIS — N2581 Secondary hyperparathyroidism of renal origin: Secondary | ICD-10-CM | POA: Diagnosis not present

## 2014-03-02 DIAGNOSIS — N186 End stage renal disease: Secondary | ICD-10-CM | POA: Diagnosis not present

## 2014-03-04 DIAGNOSIS — N2581 Secondary hyperparathyroidism of renal origin: Secondary | ICD-10-CM | POA: Diagnosis not present

## 2014-03-04 DIAGNOSIS — N186 End stage renal disease: Secondary | ICD-10-CM | POA: Diagnosis not present

## 2014-03-04 DIAGNOSIS — D509 Iron deficiency anemia, unspecified: Secondary | ICD-10-CM | POA: Diagnosis not present

## 2014-03-06 DIAGNOSIS — N186 End stage renal disease: Secondary | ICD-10-CM | POA: Diagnosis not present

## 2014-03-06 DIAGNOSIS — D509 Iron deficiency anemia, unspecified: Secondary | ICD-10-CM | POA: Diagnosis not present

## 2014-03-06 DIAGNOSIS — N2581 Secondary hyperparathyroidism of renal origin: Secondary | ICD-10-CM | POA: Diagnosis not present

## 2014-03-08 DIAGNOSIS — Z992 Dependence on renal dialysis: Secondary | ICD-10-CM | POA: Diagnosis not present

## 2014-03-08 DIAGNOSIS — N186 End stage renal disease: Secondary | ICD-10-CM | POA: Diagnosis not present

## 2014-03-09 DIAGNOSIS — N2581 Secondary hyperparathyroidism of renal origin: Secondary | ICD-10-CM | POA: Diagnosis not present

## 2014-03-09 DIAGNOSIS — N186 End stage renal disease: Secondary | ICD-10-CM | POA: Diagnosis not present

## 2014-03-10 DIAGNOSIS — H2513 Age-related nuclear cataract, bilateral: Secondary | ICD-10-CM | POA: Diagnosis not present

## 2014-03-11 DIAGNOSIS — N186 End stage renal disease: Secondary | ICD-10-CM | POA: Diagnosis not present

## 2014-03-11 DIAGNOSIS — N2581 Secondary hyperparathyroidism of renal origin: Secondary | ICD-10-CM | POA: Diagnosis not present

## 2014-03-13 DIAGNOSIS — N2581 Secondary hyperparathyroidism of renal origin: Secondary | ICD-10-CM | POA: Diagnosis not present

## 2014-03-13 DIAGNOSIS — N186 End stage renal disease: Secondary | ICD-10-CM | POA: Diagnosis not present

## 2014-03-16 DIAGNOSIS — N2581 Secondary hyperparathyroidism of renal origin: Secondary | ICD-10-CM | POA: Diagnosis not present

## 2014-03-16 DIAGNOSIS — N186 End stage renal disease: Secondary | ICD-10-CM | POA: Diagnosis not present

## 2014-03-18 DIAGNOSIS — N2581 Secondary hyperparathyroidism of renal origin: Secondary | ICD-10-CM | POA: Diagnosis not present

## 2014-03-18 DIAGNOSIS — N186 End stage renal disease: Secondary | ICD-10-CM | POA: Diagnosis not present

## 2014-03-20 DIAGNOSIS — N2581 Secondary hyperparathyroidism of renal origin: Secondary | ICD-10-CM | POA: Diagnosis not present

## 2014-03-20 DIAGNOSIS — N186 End stage renal disease: Secondary | ICD-10-CM | POA: Diagnosis not present

## 2014-03-23 DIAGNOSIS — N2581 Secondary hyperparathyroidism of renal origin: Secondary | ICD-10-CM | POA: Diagnosis not present

## 2014-03-23 DIAGNOSIS — N186 End stage renal disease: Secondary | ICD-10-CM | POA: Diagnosis not present

## 2014-03-25 DIAGNOSIS — N186 End stage renal disease: Secondary | ICD-10-CM | POA: Diagnosis not present

## 2014-03-25 DIAGNOSIS — N2581 Secondary hyperparathyroidism of renal origin: Secondary | ICD-10-CM | POA: Diagnosis not present

## 2014-03-27 DIAGNOSIS — N186 End stage renal disease: Secondary | ICD-10-CM | POA: Diagnosis not present

## 2014-03-27 DIAGNOSIS — N2581 Secondary hyperparathyroidism of renal origin: Secondary | ICD-10-CM | POA: Diagnosis not present

## 2014-03-30 DIAGNOSIS — N2581 Secondary hyperparathyroidism of renal origin: Secondary | ICD-10-CM | POA: Diagnosis not present

## 2014-03-30 DIAGNOSIS — N186 End stage renal disease: Secondary | ICD-10-CM | POA: Diagnosis not present

## 2014-04-01 DIAGNOSIS — N186 End stage renal disease: Secondary | ICD-10-CM | POA: Diagnosis not present

## 2014-04-01 DIAGNOSIS — N2581 Secondary hyperparathyroidism of renal origin: Secondary | ICD-10-CM | POA: Diagnosis not present

## 2014-04-03 DIAGNOSIS — N186 End stage renal disease: Secondary | ICD-10-CM | POA: Diagnosis not present

## 2014-04-03 DIAGNOSIS — N2581 Secondary hyperparathyroidism of renal origin: Secondary | ICD-10-CM | POA: Diagnosis not present

## 2014-04-06 DIAGNOSIS — N2581 Secondary hyperparathyroidism of renal origin: Secondary | ICD-10-CM | POA: Diagnosis not present

## 2014-04-06 DIAGNOSIS — N186 End stage renal disease: Secondary | ICD-10-CM | POA: Diagnosis not present

## 2014-04-08 DIAGNOSIS — Z992 Dependence on renal dialysis: Secondary | ICD-10-CM | POA: Diagnosis not present

## 2014-04-08 DIAGNOSIS — N2581 Secondary hyperparathyroidism of renal origin: Secondary | ICD-10-CM | POA: Diagnosis not present

## 2014-04-08 DIAGNOSIS — N186 End stage renal disease: Secondary | ICD-10-CM | POA: Diagnosis not present

## 2014-04-08 DIAGNOSIS — I120 Hypertensive chronic kidney disease with stage 5 chronic kidney disease or end stage renal disease: Secondary | ICD-10-CM | POA: Diagnosis not present

## 2014-04-10 DIAGNOSIS — N186 End stage renal disease: Secondary | ICD-10-CM | POA: Diagnosis not present

## 2014-04-10 DIAGNOSIS — N2581 Secondary hyperparathyroidism of renal origin: Secondary | ICD-10-CM | POA: Diagnosis not present

## 2014-04-13 DIAGNOSIS — N186 End stage renal disease: Secondary | ICD-10-CM | POA: Diagnosis not present

## 2014-04-13 DIAGNOSIS — N2581 Secondary hyperparathyroidism of renal origin: Secondary | ICD-10-CM | POA: Diagnosis not present

## 2014-04-15 DIAGNOSIS — N2581 Secondary hyperparathyroidism of renal origin: Secondary | ICD-10-CM | POA: Diagnosis not present

## 2014-04-15 DIAGNOSIS — N186 End stage renal disease: Secondary | ICD-10-CM | POA: Diagnosis not present

## 2014-04-17 DIAGNOSIS — N2581 Secondary hyperparathyroidism of renal origin: Secondary | ICD-10-CM | POA: Diagnosis not present

## 2014-04-17 DIAGNOSIS — N186 End stage renal disease: Secondary | ICD-10-CM | POA: Diagnosis not present

## 2014-04-20 DIAGNOSIS — N186 End stage renal disease: Secondary | ICD-10-CM | POA: Diagnosis not present

## 2014-04-20 DIAGNOSIS — N2581 Secondary hyperparathyroidism of renal origin: Secondary | ICD-10-CM | POA: Diagnosis not present

## 2014-04-22 DIAGNOSIS — N2581 Secondary hyperparathyroidism of renal origin: Secondary | ICD-10-CM | POA: Diagnosis not present

## 2014-04-22 DIAGNOSIS — N186 End stage renal disease: Secondary | ICD-10-CM | POA: Diagnosis not present

## 2014-04-24 DIAGNOSIS — N2581 Secondary hyperparathyroidism of renal origin: Secondary | ICD-10-CM | POA: Diagnosis not present

## 2014-04-24 DIAGNOSIS — N186 End stage renal disease: Secondary | ICD-10-CM | POA: Diagnosis not present

## 2014-04-27 DIAGNOSIS — N2581 Secondary hyperparathyroidism of renal origin: Secondary | ICD-10-CM | POA: Diagnosis not present

## 2014-04-27 DIAGNOSIS — N186 End stage renal disease: Secondary | ICD-10-CM | POA: Diagnosis not present

## 2014-04-29 DIAGNOSIS — N2581 Secondary hyperparathyroidism of renal origin: Secondary | ICD-10-CM | POA: Diagnosis not present

## 2014-04-29 DIAGNOSIS — N186 End stage renal disease: Secondary | ICD-10-CM | POA: Diagnosis not present

## 2014-05-01 DIAGNOSIS — N186 End stage renal disease: Secondary | ICD-10-CM | POA: Diagnosis not present

## 2014-05-01 DIAGNOSIS — N2581 Secondary hyperparathyroidism of renal origin: Secondary | ICD-10-CM | POA: Diagnosis not present

## 2014-05-04 DIAGNOSIS — N2581 Secondary hyperparathyroidism of renal origin: Secondary | ICD-10-CM | POA: Diagnosis not present

## 2014-05-04 DIAGNOSIS — N186 End stage renal disease: Secondary | ICD-10-CM | POA: Diagnosis not present

## 2014-05-06 DIAGNOSIS — N2581 Secondary hyperparathyroidism of renal origin: Secondary | ICD-10-CM | POA: Diagnosis not present

## 2014-05-06 DIAGNOSIS — N186 End stage renal disease: Secondary | ICD-10-CM | POA: Diagnosis not present

## 2014-05-08 DIAGNOSIS — I120 Hypertensive chronic kidney disease with stage 5 chronic kidney disease or end stage renal disease: Secondary | ICD-10-CM | POA: Diagnosis not present

## 2014-05-08 DIAGNOSIS — N186 End stage renal disease: Secondary | ICD-10-CM | POA: Diagnosis not present

## 2014-05-08 DIAGNOSIS — Z992 Dependence on renal dialysis: Secondary | ICD-10-CM | POA: Diagnosis not present

## 2014-05-08 DIAGNOSIS — N2581 Secondary hyperparathyroidism of renal origin: Secondary | ICD-10-CM | POA: Diagnosis not present

## 2014-05-11 DIAGNOSIS — D509 Iron deficiency anemia, unspecified: Secondary | ICD-10-CM | POA: Diagnosis not present

## 2014-05-11 DIAGNOSIS — N2581 Secondary hyperparathyroidism of renal origin: Secondary | ICD-10-CM | POA: Diagnosis not present

## 2014-05-11 DIAGNOSIS — N186 End stage renal disease: Secondary | ICD-10-CM | POA: Diagnosis not present

## 2014-05-13 DIAGNOSIS — N2581 Secondary hyperparathyroidism of renal origin: Secondary | ICD-10-CM | POA: Diagnosis not present

## 2014-05-13 DIAGNOSIS — D509 Iron deficiency anemia, unspecified: Secondary | ICD-10-CM | POA: Diagnosis not present

## 2014-05-13 DIAGNOSIS — N186 End stage renal disease: Secondary | ICD-10-CM | POA: Diagnosis not present

## 2014-05-15 DIAGNOSIS — D509 Iron deficiency anemia, unspecified: Secondary | ICD-10-CM | POA: Diagnosis not present

## 2014-05-15 DIAGNOSIS — N2581 Secondary hyperparathyroidism of renal origin: Secondary | ICD-10-CM | POA: Diagnosis not present

## 2014-05-15 DIAGNOSIS — N186 End stage renal disease: Secondary | ICD-10-CM | POA: Diagnosis not present

## 2014-05-18 DIAGNOSIS — N186 End stage renal disease: Secondary | ICD-10-CM | POA: Diagnosis not present

## 2014-05-18 DIAGNOSIS — N2581 Secondary hyperparathyroidism of renal origin: Secondary | ICD-10-CM | POA: Diagnosis not present

## 2014-05-18 DIAGNOSIS — D509 Iron deficiency anemia, unspecified: Secondary | ICD-10-CM | POA: Diagnosis not present

## 2014-05-20 DIAGNOSIS — N2581 Secondary hyperparathyroidism of renal origin: Secondary | ICD-10-CM | POA: Diagnosis not present

## 2014-05-20 DIAGNOSIS — N186 End stage renal disease: Secondary | ICD-10-CM | POA: Diagnosis not present

## 2014-05-20 DIAGNOSIS — D509 Iron deficiency anemia, unspecified: Secondary | ICD-10-CM | POA: Diagnosis not present

## 2014-05-22 DIAGNOSIS — N2581 Secondary hyperparathyroidism of renal origin: Secondary | ICD-10-CM | POA: Diagnosis not present

## 2014-05-22 DIAGNOSIS — N186 End stage renal disease: Secondary | ICD-10-CM | POA: Diagnosis not present

## 2014-05-22 DIAGNOSIS — D509 Iron deficiency anemia, unspecified: Secondary | ICD-10-CM | POA: Diagnosis not present

## 2014-05-25 DIAGNOSIS — N2581 Secondary hyperparathyroidism of renal origin: Secondary | ICD-10-CM | POA: Diagnosis not present

## 2014-05-25 DIAGNOSIS — N186 End stage renal disease: Secondary | ICD-10-CM | POA: Diagnosis not present

## 2014-05-25 DIAGNOSIS — D509 Iron deficiency anemia, unspecified: Secondary | ICD-10-CM | POA: Diagnosis not present

## 2014-05-27 DIAGNOSIS — D509 Iron deficiency anemia, unspecified: Secondary | ICD-10-CM | POA: Diagnosis not present

## 2014-05-27 DIAGNOSIS — N186 End stage renal disease: Secondary | ICD-10-CM | POA: Diagnosis not present

## 2014-05-27 DIAGNOSIS — N2581 Secondary hyperparathyroidism of renal origin: Secondary | ICD-10-CM | POA: Diagnosis not present

## 2014-05-29 DIAGNOSIS — N186 End stage renal disease: Secondary | ICD-10-CM | POA: Diagnosis not present

## 2014-05-29 DIAGNOSIS — N2581 Secondary hyperparathyroidism of renal origin: Secondary | ICD-10-CM | POA: Diagnosis not present

## 2014-05-29 DIAGNOSIS — D509 Iron deficiency anemia, unspecified: Secondary | ICD-10-CM | POA: Diagnosis not present

## 2014-06-01 DIAGNOSIS — N186 End stage renal disease: Secondary | ICD-10-CM | POA: Diagnosis not present

## 2014-06-01 DIAGNOSIS — N2581 Secondary hyperparathyroidism of renal origin: Secondary | ICD-10-CM | POA: Diagnosis not present

## 2014-06-01 DIAGNOSIS — D509 Iron deficiency anemia, unspecified: Secondary | ICD-10-CM | POA: Diagnosis not present

## 2014-06-03 DIAGNOSIS — N2581 Secondary hyperparathyroidism of renal origin: Secondary | ICD-10-CM | POA: Diagnosis not present

## 2014-06-03 DIAGNOSIS — N186 End stage renal disease: Secondary | ICD-10-CM | POA: Diagnosis not present

## 2014-06-03 DIAGNOSIS — D509 Iron deficiency anemia, unspecified: Secondary | ICD-10-CM | POA: Diagnosis not present

## 2014-06-05 DIAGNOSIS — N186 End stage renal disease: Secondary | ICD-10-CM | POA: Diagnosis not present

## 2014-06-05 DIAGNOSIS — N2581 Secondary hyperparathyroidism of renal origin: Secondary | ICD-10-CM | POA: Diagnosis not present

## 2014-06-05 DIAGNOSIS — D509 Iron deficiency anemia, unspecified: Secondary | ICD-10-CM | POA: Diagnosis not present

## 2014-06-08 DIAGNOSIS — N186 End stage renal disease: Secondary | ICD-10-CM | POA: Diagnosis not present

## 2014-06-08 DIAGNOSIS — D509 Iron deficiency anemia, unspecified: Secondary | ICD-10-CM | POA: Diagnosis not present

## 2014-06-08 DIAGNOSIS — Z992 Dependence on renal dialysis: Secondary | ICD-10-CM | POA: Diagnosis not present

## 2014-06-08 DIAGNOSIS — I120 Hypertensive chronic kidney disease with stage 5 chronic kidney disease or end stage renal disease: Secondary | ICD-10-CM | POA: Diagnosis not present

## 2014-06-08 DIAGNOSIS — N2581 Secondary hyperparathyroidism of renal origin: Secondary | ICD-10-CM | POA: Diagnosis not present

## 2014-06-10 DIAGNOSIS — N2581 Secondary hyperparathyroidism of renal origin: Secondary | ICD-10-CM | POA: Diagnosis not present

## 2014-06-10 DIAGNOSIS — N186 End stage renal disease: Secondary | ICD-10-CM | POA: Diagnosis not present

## 2014-06-10 DIAGNOSIS — D509 Iron deficiency anemia, unspecified: Secondary | ICD-10-CM | POA: Diagnosis not present

## 2014-06-12 DIAGNOSIS — N186 End stage renal disease: Secondary | ICD-10-CM | POA: Diagnosis not present

## 2014-06-12 DIAGNOSIS — D509 Iron deficiency anemia, unspecified: Secondary | ICD-10-CM | POA: Diagnosis not present

## 2014-06-12 DIAGNOSIS — N2581 Secondary hyperparathyroidism of renal origin: Secondary | ICD-10-CM | POA: Diagnosis not present

## 2014-06-15 DIAGNOSIS — N186 End stage renal disease: Secondary | ICD-10-CM | POA: Diagnosis not present

## 2014-06-15 DIAGNOSIS — N2581 Secondary hyperparathyroidism of renal origin: Secondary | ICD-10-CM | POA: Diagnosis not present

## 2014-06-15 DIAGNOSIS — D509 Iron deficiency anemia, unspecified: Secondary | ICD-10-CM | POA: Diagnosis not present

## 2014-06-17 DIAGNOSIS — D509 Iron deficiency anemia, unspecified: Secondary | ICD-10-CM | POA: Diagnosis not present

## 2014-06-17 DIAGNOSIS — N2581 Secondary hyperparathyroidism of renal origin: Secondary | ICD-10-CM | POA: Diagnosis not present

## 2014-06-17 DIAGNOSIS — N186 End stage renal disease: Secondary | ICD-10-CM | POA: Diagnosis not present

## 2014-06-19 DIAGNOSIS — D509 Iron deficiency anemia, unspecified: Secondary | ICD-10-CM | POA: Diagnosis not present

## 2014-06-19 DIAGNOSIS — N2581 Secondary hyperparathyroidism of renal origin: Secondary | ICD-10-CM | POA: Diagnosis not present

## 2014-06-19 DIAGNOSIS — N186 End stage renal disease: Secondary | ICD-10-CM | POA: Diagnosis not present

## 2014-06-22 DIAGNOSIS — N186 End stage renal disease: Secondary | ICD-10-CM | POA: Diagnosis not present

## 2014-06-22 DIAGNOSIS — D509 Iron deficiency anemia, unspecified: Secondary | ICD-10-CM | POA: Diagnosis not present

## 2014-06-22 DIAGNOSIS — N2581 Secondary hyperparathyroidism of renal origin: Secondary | ICD-10-CM | POA: Diagnosis not present

## 2014-06-24 DIAGNOSIS — N2581 Secondary hyperparathyroidism of renal origin: Secondary | ICD-10-CM | POA: Diagnosis not present

## 2014-06-24 DIAGNOSIS — N186 End stage renal disease: Secondary | ICD-10-CM | POA: Diagnosis not present

## 2014-06-24 DIAGNOSIS — D509 Iron deficiency anemia, unspecified: Secondary | ICD-10-CM | POA: Diagnosis not present

## 2014-06-26 DIAGNOSIS — D509 Iron deficiency anemia, unspecified: Secondary | ICD-10-CM | POA: Diagnosis not present

## 2014-06-26 DIAGNOSIS — N2581 Secondary hyperparathyroidism of renal origin: Secondary | ICD-10-CM | POA: Diagnosis not present

## 2014-06-26 DIAGNOSIS — N186 End stage renal disease: Secondary | ICD-10-CM | POA: Diagnosis not present

## 2014-06-29 DIAGNOSIS — D509 Iron deficiency anemia, unspecified: Secondary | ICD-10-CM | POA: Diagnosis not present

## 2014-06-29 DIAGNOSIS — N186 End stage renal disease: Secondary | ICD-10-CM | POA: Diagnosis not present

## 2014-06-29 DIAGNOSIS — N2581 Secondary hyperparathyroidism of renal origin: Secondary | ICD-10-CM | POA: Diagnosis not present

## 2014-07-01 DIAGNOSIS — N186 End stage renal disease: Secondary | ICD-10-CM | POA: Diagnosis not present

## 2014-07-01 DIAGNOSIS — D509 Iron deficiency anemia, unspecified: Secondary | ICD-10-CM | POA: Diagnosis not present

## 2014-07-01 DIAGNOSIS — N2581 Secondary hyperparathyroidism of renal origin: Secondary | ICD-10-CM | POA: Diagnosis not present

## 2014-07-03 DIAGNOSIS — N186 End stage renal disease: Secondary | ICD-10-CM | POA: Diagnosis not present

## 2014-07-03 DIAGNOSIS — N2581 Secondary hyperparathyroidism of renal origin: Secondary | ICD-10-CM | POA: Diagnosis not present

## 2014-07-03 DIAGNOSIS — D509 Iron deficiency anemia, unspecified: Secondary | ICD-10-CM | POA: Diagnosis not present

## 2014-07-06 DIAGNOSIS — D509 Iron deficiency anemia, unspecified: Secondary | ICD-10-CM | POA: Diagnosis not present

## 2014-07-06 DIAGNOSIS — N186 End stage renal disease: Secondary | ICD-10-CM | POA: Diagnosis not present

## 2014-07-06 DIAGNOSIS — N2581 Secondary hyperparathyroidism of renal origin: Secondary | ICD-10-CM | POA: Diagnosis not present

## 2014-07-08 DIAGNOSIS — N186 End stage renal disease: Secondary | ICD-10-CM | POA: Diagnosis not present

## 2014-07-08 DIAGNOSIS — Z992 Dependence on renal dialysis: Secondary | ICD-10-CM | POA: Diagnosis not present

## 2014-07-08 DIAGNOSIS — D509 Iron deficiency anemia, unspecified: Secondary | ICD-10-CM | POA: Diagnosis not present

## 2014-07-08 DIAGNOSIS — I120 Hypertensive chronic kidney disease with stage 5 chronic kidney disease or end stage renal disease: Secondary | ICD-10-CM | POA: Diagnosis not present

## 2014-07-08 DIAGNOSIS — N2581 Secondary hyperparathyroidism of renal origin: Secondary | ICD-10-CM | POA: Diagnosis not present

## 2014-07-10 DIAGNOSIS — N186 End stage renal disease: Secondary | ICD-10-CM | POA: Diagnosis not present

## 2014-07-10 DIAGNOSIS — N2581 Secondary hyperparathyroidism of renal origin: Secondary | ICD-10-CM | POA: Diagnosis not present

## 2014-07-10 DIAGNOSIS — D509 Iron deficiency anemia, unspecified: Secondary | ICD-10-CM | POA: Diagnosis not present

## 2014-07-13 DIAGNOSIS — D509 Iron deficiency anemia, unspecified: Secondary | ICD-10-CM | POA: Diagnosis not present

## 2014-07-13 DIAGNOSIS — N2581 Secondary hyperparathyroidism of renal origin: Secondary | ICD-10-CM | POA: Diagnosis not present

## 2014-07-13 DIAGNOSIS — N186 End stage renal disease: Secondary | ICD-10-CM | POA: Diagnosis not present

## 2014-07-15 DIAGNOSIS — N186 End stage renal disease: Secondary | ICD-10-CM | POA: Diagnosis not present

## 2014-07-15 DIAGNOSIS — N2581 Secondary hyperparathyroidism of renal origin: Secondary | ICD-10-CM | POA: Diagnosis not present

## 2014-07-15 DIAGNOSIS — D509 Iron deficiency anemia, unspecified: Secondary | ICD-10-CM | POA: Diagnosis not present

## 2014-07-17 DIAGNOSIS — N2581 Secondary hyperparathyroidism of renal origin: Secondary | ICD-10-CM | POA: Diagnosis not present

## 2014-07-17 DIAGNOSIS — D509 Iron deficiency anemia, unspecified: Secondary | ICD-10-CM | POA: Diagnosis not present

## 2014-07-17 DIAGNOSIS — N186 End stage renal disease: Secondary | ICD-10-CM | POA: Diagnosis not present

## 2014-07-20 DIAGNOSIS — N186 End stage renal disease: Secondary | ICD-10-CM | POA: Diagnosis not present

## 2014-07-20 DIAGNOSIS — D509 Iron deficiency anemia, unspecified: Secondary | ICD-10-CM | POA: Diagnosis not present

## 2014-07-20 DIAGNOSIS — N2581 Secondary hyperparathyroidism of renal origin: Secondary | ICD-10-CM | POA: Diagnosis not present

## 2014-07-22 DIAGNOSIS — N186 End stage renal disease: Secondary | ICD-10-CM | POA: Diagnosis not present

## 2014-07-22 DIAGNOSIS — N2581 Secondary hyperparathyroidism of renal origin: Secondary | ICD-10-CM | POA: Diagnosis not present

## 2014-07-22 DIAGNOSIS — D509 Iron deficiency anemia, unspecified: Secondary | ICD-10-CM | POA: Diagnosis not present

## 2014-07-24 DIAGNOSIS — N186 End stage renal disease: Secondary | ICD-10-CM | POA: Diagnosis not present

## 2014-07-24 DIAGNOSIS — N2581 Secondary hyperparathyroidism of renal origin: Secondary | ICD-10-CM | POA: Diagnosis not present

## 2014-07-24 DIAGNOSIS — D509 Iron deficiency anemia, unspecified: Secondary | ICD-10-CM | POA: Diagnosis not present

## 2014-07-27 DIAGNOSIS — D509 Iron deficiency anemia, unspecified: Secondary | ICD-10-CM | POA: Diagnosis not present

## 2014-07-27 DIAGNOSIS — N186 End stage renal disease: Secondary | ICD-10-CM | POA: Diagnosis not present

## 2014-07-27 DIAGNOSIS — N2581 Secondary hyperparathyroidism of renal origin: Secondary | ICD-10-CM | POA: Diagnosis not present

## 2014-07-29 DIAGNOSIS — D509 Iron deficiency anemia, unspecified: Secondary | ICD-10-CM | POA: Diagnosis not present

## 2014-07-29 DIAGNOSIS — N186 End stage renal disease: Secondary | ICD-10-CM | POA: Diagnosis not present

## 2014-07-29 DIAGNOSIS — N2581 Secondary hyperparathyroidism of renal origin: Secondary | ICD-10-CM | POA: Diagnosis not present

## 2014-07-30 ENCOUNTER — Emergency Department (INDEPENDENT_AMBULATORY_CARE_PROVIDER_SITE_OTHER)
Admission: EM | Admit: 2014-07-30 | Discharge: 2014-07-30 | Disposition: A | Discharge status: Home / Self Care | Payer: Medicare Other | Source: Home / Self Care | Attending: Family Medicine | Admitting: Family Medicine

## 2014-07-30 ENCOUNTER — Encounter (HOSPITAL_COMMUNITY): Payer: Self-pay | Admitting: Emergency Medicine

## 2014-07-30 ENCOUNTER — Other Ambulatory Visit (HOSPITAL_COMMUNITY)
Admission: RE | Admit: 2014-07-30 | Discharge: 2014-07-30 | Disposition: A | Discharge status: Home / Self Care | Payer: Medicare Other | Source: Ambulatory Visit | Attending: Family Medicine | Admitting: Family Medicine

## 2014-07-30 DIAGNOSIS — N451 Epididymitis: Secondary | ICD-10-CM

## 2014-07-30 DIAGNOSIS — N342 Other urethritis: Secondary | ICD-10-CM

## 2014-07-30 DIAGNOSIS — Z113 Encounter for screening for infections with a predominantly sexual mode of transmission: Secondary | ICD-10-CM | POA: Insufficient documentation

## 2014-07-30 MED ORDER — DOXYCYCLINE HYCLATE 100 MG PO CAPS: 100.0000 mg | ORAL_CAPSULE | Freq: Two times a day (BID) | ORAL | Status: DC

## 2014-07-30 NOTE — Discharge Instructions (Signed)
Urethritis Urethritis is an inflammation of the tube through which urine exits your bladder (urethra).  CAUSES Urethritis is often caused by an infection in your urethra. The infection can be viral, like herpes. The infection can also be bacterial, like gonorrhea. RISK FACTORS Risk factors of urethritis include:  Having sex without using a condom.  Having multiple sexual partners.  Having poor hygiene. SIGNS AND SYMPTOMS Symptoms of urethritis are less noticeable in women than in men. These symptoms include:  Burning feeling when you urinate (dysuria).  Discharge from your urethra.  Blood in your urine (hematuria).  Urinating more than usual. DIAGNOSIS  To confirm a diagnosis of urethritis, your health care provider will do the following:  Ask about your sexual history.  Perform a physical exam.  Have you provide a sample of your urine for lab testing.  Use a cotton swab to gently collect a sample from your urethra for lab testing. TREATMENT  It is important to treat urethritis. Depending on the cause, untreated urethritis may lead to serious genital infections and possibly infertility. Urethritis caused by a bacterial infection is treated with antibiotic medicine. All sexual partners must be treated.  HOME CARE INSTRUCTIONS  Do not have sex until the test results are known and treatment is completed, even if your symptoms go away before you finish treatment.  If you were prescribed an antibiotic, finish it all even if you start to feel better. SEEK MEDICAL CARE IF:   Your symptoms are not improved in 3 days.  Your symptoms are getting worse.  You develop abdominal pain or pelvic pain (in women).  You develop joint pain.  You have a fever. SEEK IMMEDIATE MEDICAL CARE IF:   You have severe pain in the belly, back, or side.  You have repeated vomiting. MAKE SURE YOU:  Understand these instructions.  Will watch your condition.  Will get help right away if you  are not doing well or get worse. Document Released: 06/20/2000 Document Revised: 05/11/2013 Document Reviewed: 08/25/2012 [facility] Patient Information 2015 [place], [place]. This information is not intended to replace advice given to you by your health care provider. Make sure you discuss any questions you have with your health care provider.  Epididymitis Epididymitis is a swelling (inflammation) of the epididymis. The epididymis is a cord-like structure along the back part of the testicle. Epididymitis is usually, but not always, caused by infection. This is usually a sudden problem beginning with chills, fever and pain behind the scrotum and in the testicle. There may be swelling and redness of the testicle. DIAGNOSIS  Physical examination will reveal a tender, swollen epididymis. Sometimes, cultures are obtained from the urine or from prostate secretions to help find out if there is an infection or if the cause is a different problem. Sometimes, blood work is performed to see if your white blood cell count is elevated and if a germ (bacterial) or viral infection is present. Using this knowledge, an appropriate medicine which kills germs (antibiotic) can be chosen by your caregiver. A viral infection causing epididymitis will most often go away (resolve) without treatment. HOME CARE INSTRUCTIONS   Hot sitz baths for 20 minutes, 4 times per day, may help relieve pain.  Only take over-the-counter or prescription medicines for pain, discomfort or fever as directed by your caregiver.  Take all medicines, including antibiotics, as directed. Take the antibiotics for the full prescribed length of time even if you are feeling better.  It is very important to keep all  follow-up appointments. SEEK IMMEDIATE MEDICAL CARE IF:   You have a fever.  You have pain not relieved with medicines.  You have any worsening of your problems.  Your pain seems to come and go.  You develop pain, redness, and  swelling in the scrotum and surrounding areas. MAKE SURE YOU:   Understand these instructions.  Will watch your condition.  Will get help right away if you are not doing well or get worse. Document Released: 12/23/1999 Document Revised: 03/19/2011 Document Reviewed: 11/11/2008 [facility] Patient Information 2015 [place], [place]. This information is not intended to replace advice given to you by your health care provider. Make sure you discuss any questions you have with your health care provider.

## 2014-07-30 NOTE — ED Notes (Signed)
Patient concerned for genitalia pain.  No discharge, no blisters

## 2014-07-30 NOTE — ED Provider Notes (Signed)
CSN: [id number]     Arrival date & time 07/30/14  1301 History   First MD Initiated Contact with Patient 07/30/14 1310     Chief Complaint  Patient presents with  . SEXUALLY TRANSMITTED DISEASE   (Consider location/radiation/quality/duration/timing/severity/associated sxs/prior Treatment) HPI Comments: 59 year old male complaining of dental pain for 2 weeks. Complaining of pain in the penis as well as around the testicles. He had some leftover doxycycline he took 4 capsules and he felt a low bit better for a couple of days and then the symptoms return. He has had unprotected sex recently. He denies penile discharge. He denies dysuria. He states he rarely urinates due to his end-stage renal disease. He is on dialysis.   Past Medical History  Diagnosis Date  . CRF (chronic renal failure)   . Hypertension   . Family history of anesthesia complication     [place] going to sleep.  . Parathyroid disease     Hx of , No meds at present  . 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  . Gout   . Arthritis   . Hyperlipidemia   . Anemia   . Hyperuricemia   . MRSA (methicillin resistant staph aureus) culture positive 07/2011  . Tobacco abuse   . Hx of cocaine abuse    Past Surgical History  Procedure Laterality Date  . Av fistula placement Left 2010  . Appendectomy  1980  . Mandible fracture surgery    . Angioplasty  08/09/2011    Procedure: ANGIOPLASTY;  Surgeon: Angelia Mould, MD;  Location: [facility];  Service: Vascular;  Laterality: Left;  Left Braciocephalic Fistula using Vascu-Guard Patch  . Incision and drainage  12/2012    sebacebus cyst, infected  . [name] N/A 07/16/2011    Procedure: Earney Mallet;  Surgeon: Angelia Mould, MD;  Location: [facility] CATH LAB;  Service: Cardiovascular;  Laterality: N/A;   Family History  Problem Relation Age of Onset  . Diabetes Father   . Heart disease Father   . Hypertension Father   . Other Father     amputation  .  Diabetes Sister   . Heart disease Sister   . Colon cancer Neg Hx    History  Substance Use Topics  . Smoking status: Current Some Day Smoker -- 0.10 packs/day for 30 years    Types: Cigarettes  . Smokeless tobacco: Never Used     Comment: pt states that he is aware of how to quit  . Alcohol Use: 2.4 oz/week    4 Cans of beer per week     Comment: occasional    Review of Systems  Constitutional: Negative for fever, activity change and fatigue.  Respiratory: Negative.   Cardiovascular: Negative for chest pain.  Gastrointestinal: Negative for nausea, vomiting, abdominal pain and abdominal distention.  Genitourinary: Positive for penile pain and testicular pain. Negative for dysuria, urgency, discharge and genital sores.  Neurological: Negative.     Allergies  Review of patient's allergies indicates no known allergies.  Home Medications   Prior to Admission medications   Medication Sig Start Date End Date Taking? Authorizing Provider  allopurinol (ZYLOPRIM) 100 MG tablet Take 200 mg by mouth daily.  05/18/11   Historical Provider, MD  atenolol (TENORMIN) 50 MG tablet Take 100 mg by mouth daily.     Historical Provider, MD  calcium acetate (PHOSLO) 667 MG capsule  12/10/12   Historical Provider, MD  Calcium Carbonate-Vitamin D (CALCIUM 500/D PO) Take  2 tablets by mouth 3 (three) times daily.     Historical Provider, MD  cloNIDine (CATAPRES) 0.2 MG tablet Take 0.2 mg by mouth 2 (two) times daily.     Historical Provider, MD  doxazosin (CARDURA) 8 MG tablet Take 8 mg by mouth at bedtime.    Historical Provider, MD  doxycycline (VIBRAMYCIN) 100 MG capsule Take 1 capsule (100 mg total) by mouth 2 (two) times daily. 07/30/14   Janne Napoleon, NP  FOSRENOL 1000 MG chewable tablet  12/11/12   Historical Provider, MD  MOVIPREP 100 G SOLR moviprep as directed. No substitutions 02/11/13   Gatha Mayer, MD  simvastatin (ZOCOR) 20 MG tablet Take 20 mg by mouth at bedtime.     Historical Provider, MD    BP 160/90 mmHg  Pulse 76  Temp(Src) 99.1 F (37.3 C) (Oral)  Resp 16  SpO2 95% Physical Exam  Constitutional: He is oriented to person, place, and time. He appears well-developed and well-nourished.  Neck: Normal range of motion. Neck supple.  Cardiovascular: Normal rate.   Pulmonary/Chest: Effort normal. No respiratory distress.  Genitourinary: Penis normal.  Mild tenderness to the superior poles of the testicles and along the epididymal apparatus. No nodules palpated. No orchitis appreciated. No penile discharge. No local erythema. No regional lymphadenopathy.  Musculoskeletal: He exhibits no edema.  Neurological: He is alert and oriented to person, place, and time. He exhibits normal muscle tone.  Skin: Skin is warm and dry.  Nursing note and vitals reviewed.   ED Course  Procedures (including critical care time) Labs Review Labs Reviewed  CYTOLOGY, (ORAL, ANAL, URETHRAL) ANCILLARY ONLY    Imaging Review No results found.   MDM   1. Urethritis   2. Acute epididymitis    Doxy bid x 10 d F/U with your PCP Urethral cytology pendng. Unable to obtain urine due to ESRD    Janne Napoleon, NP 07/30/14 1330

## 2014-07-31 DIAGNOSIS — N2581 Secondary hyperparathyroidism of renal origin: Secondary | ICD-10-CM | POA: Diagnosis not present

## 2014-07-31 DIAGNOSIS — D509 Iron deficiency anemia, unspecified: Secondary | ICD-10-CM | POA: Diagnosis not present

## 2014-07-31 DIAGNOSIS — N186 End stage renal disease: Secondary | ICD-10-CM | POA: Diagnosis not present

## 2014-08-02 LAB — CYTOLOGY, (ORAL, ANAL, URETHRAL) ANCILLARY ONLY
Chlamydia: NEGATIVE
NEISSERIA GONORRHEA: NEGATIVE
Trichomonas: NEGATIVE

## 2014-08-02 NOTE — ED Notes (Signed)
Final report GC, chlamydia negative

## 2014-08-03 DIAGNOSIS — N2581 Secondary hyperparathyroidism of renal origin: Secondary | ICD-10-CM | POA: Diagnosis not present

## 2014-08-03 DIAGNOSIS — N186 End stage renal disease: Secondary | ICD-10-CM | POA: Diagnosis not present

## 2014-08-03 DIAGNOSIS — D509 Iron deficiency anemia, unspecified: Secondary | ICD-10-CM | POA: Diagnosis not present

## 2014-08-05 DIAGNOSIS — N2581 Secondary hyperparathyroidism of renal origin: Secondary | ICD-10-CM | POA: Diagnosis not present

## 2014-08-05 DIAGNOSIS — N186 End stage renal disease: Secondary | ICD-10-CM | POA: Diagnosis not present

## 2014-08-05 DIAGNOSIS — D509 Iron deficiency anemia, unspecified: Secondary | ICD-10-CM | POA: Diagnosis not present

## 2014-08-07 DIAGNOSIS — N2581 Secondary hyperparathyroidism of renal origin: Secondary | ICD-10-CM | POA: Diagnosis not present

## 2014-08-07 DIAGNOSIS — D509 Iron deficiency anemia, unspecified: Secondary | ICD-10-CM | POA: Diagnosis not present

## 2014-08-07 DIAGNOSIS — N186 End stage renal disease: Secondary | ICD-10-CM | POA: Diagnosis not present

## 2014-08-08 DIAGNOSIS — N186 End stage renal disease: Secondary | ICD-10-CM | POA: Diagnosis not present

## 2014-08-08 DIAGNOSIS — Z992 Dependence on renal dialysis: Secondary | ICD-10-CM | POA: Diagnosis not present

## 2014-08-08 DIAGNOSIS — I120 Hypertensive chronic kidney disease with stage 5 chronic kidney disease or end stage renal disease: Secondary | ICD-10-CM | POA: Diagnosis not present

## 2014-08-10 DIAGNOSIS — N2581 Secondary hyperparathyroidism of renal origin: Secondary | ICD-10-CM | POA: Diagnosis not present

## 2014-08-10 DIAGNOSIS — E8770 Fluid overload, unspecified: Secondary | ICD-10-CM | POA: Diagnosis not present

## 2014-08-10 DIAGNOSIS — N186 End stage renal disease: Secondary | ICD-10-CM | POA: Diagnosis not present

## 2014-08-11 ENCOUNTER — Emergency Department (INDEPENDENT_AMBULATORY_CARE_PROVIDER_SITE_OTHER)
Admission: EM | Admit: 2014-08-11 | Discharge: 2014-08-11 | Disposition: A | Discharge status: Home / Self Care | Payer: Medicare Other | Source: Home / Self Care | Attending: Family Medicine | Admitting: Family Medicine

## 2014-08-11 ENCOUNTER — Encounter (HOSPITAL_COMMUNITY): Payer: Self-pay | Admitting: Emergency Medicine

## 2014-08-11 DIAGNOSIS — N451 Epididymitis: Secondary | ICD-10-CM

## 2014-08-11 DIAGNOSIS — I10 Essential (primary) hypertension: Secondary | ICD-10-CM | POA: Diagnosis not present

## 2014-08-11 DIAGNOSIS — N4889 Other specified disorders of penis: Secondary | ICD-10-CM

## 2014-08-11 DIAGNOSIS — N186 End stage renal disease: Secondary | ICD-10-CM

## 2014-08-11 DIAGNOSIS — Z992 Dependence on renal dialysis: Secondary | ICD-10-CM

## 2014-08-11 MED ORDER — CEFTRIAXONE SODIUM 250 MG IJ SOLR
INTRAMUSCULAR | Status: AC
  Filled 2014-08-11: qty 250

## 2014-08-11 MED ORDER — LIDOCAINE HCL (PF) 1 % IJ SOLN
INTRAMUSCULAR | Status: AC
  Filled 2014-08-11: qty 5

## 2014-08-11 MED ORDER — CEFTRIAXONE SODIUM 250 MG IJ SOLR
250.0000 mg | Freq: Once | INTRAMUSCULAR | Status: AC
  Administered 2014-08-11: 250 mg via INTRAMUSCULAR

## 2014-08-11 MED ORDER — DOXYCYCLINE HYCLATE 100 MG PO CAPS: 100.0000 mg | ORAL_CAPSULE | Freq: Two times a day (BID) | ORAL | Status: DC

## 2014-08-11 NOTE — ED Provider Notes (Signed)
CSN: [id number]     Arrival date & time 08/11/14  1819 History   First MD Initiated Contact with Patient 08/11/14 1857     Chief Complaint  Patient presents with  . Follow-up   (Consider location/radiation/quality/duration/timing/severity/associated sxs/prior Treatment) HPI  Penile shaft pain. Last pill 2 days ago. Symptoms are no longer improving. No penile discharge. Still w/ testicular pain though improved overall. No urine. Continues dialysis. Denies fevers, nuasea, vomiting, CP, SOB.     Past Medical History  Diagnosis Date  . CRF (chronic renal failure)   . Hypertension   . Family history of anesthesia complication     [place] going to sleep.  . Parathyroid disease     Hx of , No meds at present  . 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  . Gout   . Arthritis   . Hyperlipidemia   . Anemia   . Hyperuricemia   . MRSA (methicillin resistant staph aureus) culture positive 07/2011  . Tobacco abuse   . Hx of cocaine abuse    Past Surgical History  Procedure Laterality Date  . Av fistula placement Left 2010  . Appendectomy  1980  . Mandible fracture surgery    . Angioplasty  08/09/2011    Procedure: ANGIOPLASTY;  Surgeon: Angelia Mould, MD;  Location: [facility];  Service: Vascular;  Laterality: Left;  Left Braciocephalic Fistula using Vascu-Guard Patch  . Incision and drainage  12/2012    sebacebus cyst, infected  . [name] N/A 07/16/2011    Procedure: Earney Mallet;  Surgeon: Angelia Mould, MD;  Location: [facility] CATH LAB;  Service: Cardiovascular;  Laterality: N/A;   Family History  Problem Relation Age of Onset  . Diabetes Father   . Heart disease Father   . Hypertension Father   . Other Father     amputation  . Diabetes Sister   . Heart disease Sister   . Colon cancer Neg Hx    History  Substance Use Topics  . Smoking status: Current Some Day Smoker -- 0.10 packs/day for 30 years    Types: Cigarettes  . Smokeless tobacco: Never  Used     Comment: pt states that he is aware of how to quit  . Alcohol Use: 2.4 oz/week    4 Cans of beer per week     Comment: occasional    Review of Systems Per HPI with all other pertinent systems negative.   Allergies  Review of patient's allergies indicates no known allergies.  Home Medications   Prior to Admission medications   Medication Sig Start Date End Date Taking? Authorizing Provider  allopurinol (ZYLOPRIM) 100 MG tablet Take 200 mg by mouth daily.  05/18/11   Historical Provider, MD  atenolol (TENORMIN) 50 MG tablet Take 100 mg by mouth daily.     Historical Provider, MD  calcium acetate (PHOSLO) 667 MG capsule  12/10/12   Historical Provider, MD  Calcium Carbonate-Vitamin D (CALCIUM 500/D PO) Take 2 tablets by mouth 3 (three) times daily.     Historical Provider, MD  cloNIDine (CATAPRES) 0.2 MG tablet Take 0.2 mg by mouth 2 (two) times daily.     Historical Provider, MD  doxazosin (CARDURA) 8 MG tablet Take 8 mg by mouth at bedtime.    Historical Provider, MD  doxycycline (VIBRAMYCIN) 100 MG capsule Take 1 capsule (100 mg total) by mouth 2 (two) times daily. 08/11/14   Waldemar Dickens, MD  FOSRENOL 1000 MG chewable  tablet  12/11/12   Historical Provider, MD  MOVIPREP 100 G SOLR moviprep as directed. No substitutions 02/11/13   Gatha Mayer, MD  simvastatin (ZOCOR) 20 MG tablet Take 20 mg by mouth at bedtime.     Historical Provider, MD   BP 164/89 mmHg  Pulse 71  Temp(Src) 98.4 F (36.9 C) (Oral)  Resp 16  SpO2 96% Physical Exam Physical Exam  Constitutional: oriented to person, place, and time. appears well-developed and well-nourished. No distress.  HENT:  Head: Normocephalic and atraumatic.  Eyes: EOMI. PERRL.  Neck: Normal range of motion.  Cardiovascular: RRR, no m/r/g, 2+ distal pulses,  Pulmonary/Chest: Effort normal and breath sounds normal. No respiratory distress.  Abdominal: Soft. Bowel sounds are normal. NonTTP, no distension.  Musculoskeletal:  Normal range of motion. Non ttp, no effusion.  Neurological: alert and oriented to person, place, and time.  Skin: Skin is warm. No rash noted. non diaphoretic.  Psychiatric: normal mood and affect. behavior is normal. Judgment and thought content normal.   ED Course  Procedures (including critical care time) Labs Review Labs Reviewed - No data to display  Imaging Review No results found.   MDM   1. Penile pain   2. Epididymitis   3. Essential hypertension   4. ESRD on dialysis    CTX 250mg  IM in clinic.  Doxy 100mg  BID x 4 more days. Anticipate poorly cleared infectino due to no urine production vs psychosomatic complaints.  No further treatment w/o further worku pat PCP, URology, or in ED.  HTN pt due to for dialysis tomorrow    Waldemar Dickens, MD 08/11/14 [id number]

## 2014-08-11 NOTE — ED Notes (Signed)
Here for follow up on epididymitis States he finish doxycycline

## 2014-08-11 NOTE — Discharge Instructions (Signed)
You likely need a few more days of medication. You were given an additional dose of antibiotic in our clinic please go to you regularly scheduled dialysis.

## 2014-08-12 DIAGNOSIS — N186 End stage renal disease: Secondary | ICD-10-CM | POA: Diagnosis not present

## 2014-08-12 DIAGNOSIS — N2581 Secondary hyperparathyroidism of renal origin: Secondary | ICD-10-CM | POA: Diagnosis not present

## 2014-08-12 DIAGNOSIS — E8770 Fluid overload, unspecified: Secondary | ICD-10-CM | POA: Diagnosis not present

## 2014-08-14 DIAGNOSIS — N2581 Secondary hyperparathyroidism of renal origin: Secondary | ICD-10-CM | POA: Diagnosis not present

## 2014-08-14 DIAGNOSIS — N186 End stage renal disease: Secondary | ICD-10-CM | POA: Diagnosis not present

## 2014-08-14 DIAGNOSIS — E8770 Fluid overload, unspecified: Secondary | ICD-10-CM | POA: Diagnosis not present

## 2014-08-17 DIAGNOSIS — N2581 Secondary hyperparathyroidism of renal origin: Secondary | ICD-10-CM | POA: Diagnosis not present

## 2014-08-17 DIAGNOSIS — N186 End stage renal disease: Secondary | ICD-10-CM | POA: Diagnosis not present

## 2014-08-17 DIAGNOSIS — E8770 Fluid overload, unspecified: Secondary | ICD-10-CM | POA: Diagnosis not present

## 2014-08-19 DIAGNOSIS — N186 End stage renal disease: Secondary | ICD-10-CM | POA: Diagnosis not present

## 2014-08-19 DIAGNOSIS — E8770 Fluid overload, unspecified: Secondary | ICD-10-CM | POA: Diagnosis not present

## 2014-08-19 DIAGNOSIS — N2581 Secondary hyperparathyroidism of renal origin: Secondary | ICD-10-CM | POA: Diagnosis not present

## 2014-08-21 DIAGNOSIS — N186 End stage renal disease: Secondary | ICD-10-CM | POA: Diagnosis not present

## 2014-08-21 DIAGNOSIS — E8770 Fluid overload, unspecified: Secondary | ICD-10-CM | POA: Diagnosis not present

## 2014-08-21 DIAGNOSIS — N2581 Secondary hyperparathyroidism of renal origin: Secondary | ICD-10-CM | POA: Diagnosis not present

## 2014-08-24 DIAGNOSIS — N2581 Secondary hyperparathyroidism of renal origin: Secondary | ICD-10-CM | POA: Diagnosis not present

## 2014-08-24 DIAGNOSIS — E8770 Fluid overload, unspecified: Secondary | ICD-10-CM | POA: Diagnosis not present

## 2014-08-24 DIAGNOSIS — N186 End stage renal disease: Secondary | ICD-10-CM | POA: Diagnosis not present

## 2014-08-25 DIAGNOSIS — N186 End stage renal disease: Secondary | ICD-10-CM | POA: Diagnosis not present

## 2014-08-25 DIAGNOSIS — E8770 Fluid overload, unspecified: Secondary | ICD-10-CM | POA: Diagnosis not present

## 2014-08-25 DIAGNOSIS — N2581 Secondary hyperparathyroidism of renal origin: Secondary | ICD-10-CM | POA: Diagnosis not present

## 2014-08-26 DIAGNOSIS — N186 End stage renal disease: Secondary | ICD-10-CM | POA: Diagnosis not present

## 2014-08-26 DIAGNOSIS — E8770 Fluid overload, unspecified: Secondary | ICD-10-CM | POA: Diagnosis not present

## 2014-08-26 DIAGNOSIS — N2581 Secondary hyperparathyroidism of renal origin: Secondary | ICD-10-CM | POA: Diagnosis not present

## 2014-08-27 ENCOUNTER — Encounter (HOSPITAL_COMMUNITY): Payer: Self-pay | Admitting: Emergency Medicine

## 2014-08-27 ENCOUNTER — Emergency Department (INDEPENDENT_AMBULATORY_CARE_PROVIDER_SITE_OTHER)
Admission: EM | Admit: 2014-08-27 | Discharge: 2014-08-27 | Disposition: A | Discharge status: Home / Self Care | Payer: Medicare Other | Source: Home / Self Care | Attending: Family Medicine | Admitting: Family Medicine

## 2014-08-27 DIAGNOSIS — N341 Nonspecific urethritis: Secondary | ICD-10-CM | POA: Diagnosis not present

## 2014-08-27 MED ORDER — DOXYCYCLINE HYCLATE 100 MG PO CAPS: 100.0000 mg | ORAL_CAPSULE | Freq: Two times a day (BID) | ORAL | Status: DC

## 2014-08-27 MED ORDER — AZITHROMYCIN 250 MG PO TABS
ORAL_TABLET | ORAL | Status: AC
  Filled 2014-08-27: qty 4

## 2014-08-27 MED ORDER — CEFTRIAXONE SODIUM 250 MG IJ SOLR
INTRAMUSCULAR | Status: AC
  Filled 2014-08-27: qty 250

## 2014-08-27 MED ORDER — AZITHROMYCIN 250 MG PO TABS
1000.0000 mg | ORAL_TABLET | Freq: Once | ORAL | Status: AC
  Administered 2014-08-27: 1000 mg via ORAL

## 2014-08-27 MED ORDER — LIDOCAINE HCL (PF) 1 % IJ SOLN
INTRAMUSCULAR | Status: AC
  Filled 2014-08-27: qty 5

## 2014-08-27 MED ORDER — CEFTRIAXONE SODIUM 250 MG IJ SOLR
250.0000 mg | Freq: Once | INTRAMUSCULAR | Status: AC
  Administered 2014-08-27: 250 mg via INTRAMUSCULAR

## 2014-08-27 NOTE — ED Provider Notes (Signed)
CSN: [id number]     Arrival date & time 08/27/14  1943 History   First MD Initiated Contact with Patient 08/27/14 2001     No chief complaint on file.  (Consider location/radiation/quality/duration/timing/severity/associated sxs/prior Treatment) Patient is a 59 y.o. male presenting with dysuria. The history is provided by the patient.  Dysuria This is a recurrent problem. The current episode started more than 1 week ago (has been seen in [place] and early aug here at [facility] for same problem, not sexually active, improved but not resolved requesting some more abx pills.). The problem has been gradually improving. Pertinent negatives include no abdominal pain. Associated symptoms comments: No longer in scrotum or testes, has improved..    Past Medical History  Diagnosis Date  . CRF (chronic renal failure)   . Hypertension   . Family history of anesthesia complication     [place] going to sleep.  . Parathyroid disease     Hx of , No meds at present  . 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  . Gout   . Arthritis   . Hyperlipidemia   . Anemia   . Hyperuricemia   . MRSA (methicillin resistant staph aureus) culture positive 07/2011  . Tobacco abuse   . Hx of cocaine abuse    Past Surgical History  Procedure Laterality Date  . Av fistula placement Left 2010  . Appendectomy  1980  . Mandible fracture surgery    . Angioplasty  08/09/2011    Procedure: ANGIOPLASTY;  Surgeon: Angelia Mould, MD;  Location: [facility];  Service: Vascular;  Laterality: Left;  Left Braciocephalic Fistula using Vascu-Guard Patch  . Incision and drainage  12/2012    sebacebus cyst, infected  . [name] N/A 07/16/2011    Procedure: Earney Mallet;  Surgeon: Angelia Mould, MD;  Location: [facility] CATH LAB;  Service: Cardiovascular;  Laterality: N/A;   Family History  Problem Relation Age of Onset  . Diabetes Father   . Heart disease Father   . Hypertension Father   . Other Father      amputation  . Diabetes Sister   . Heart disease Sister   . Colon cancer Neg Hx    Social History  Substance Use Topics  . Smoking status: Current Some Day Smoker -- 0.10 packs/day for 30 years    Types: Cigarettes  . Smokeless tobacco: Never Used     Comment: pt states that he is aware of how to quit  . Alcohol Use: 2.4 oz/week    4 Cans of beer per week     Comment: occasional    Review of Systems  Constitutional: Negative.   Gastrointestinal: Negative for abdominal pain.  Genitourinary: Positive for dysuria. Negative for urgency, frequency, discharge, penile swelling, scrotal swelling, genital sores, penile pain and testicular pain.    Allergies  Review of patient's allergies indicates no known allergies.  Home Medications   Prior to Admission medications   Medication Sig Start Date End Date Taking? Authorizing Provider  allopurinol (ZYLOPRIM) 100 MG tablet Take 200 mg by mouth daily.  05/18/11   Historical Provider, MD  atenolol (TENORMIN) 50 MG tablet Take 100 mg by mouth daily.     Historical Provider, MD  calcium acetate (PHOSLO) 667 MG capsule  12/10/12   Historical Provider, MD  Calcium Carbonate-Vitamin D (CALCIUM 500/D PO) Take 2 tablets by mouth 3 (three) times daily.     Historical Provider, MD  cloNIDine (CATAPRES) 0.2 MG tablet  Take 0.2 mg by mouth 2 (two) times daily.     Historical Provider, MD  doxazosin (CARDURA) 8 MG tablet Take 8 mg by mouth at bedtime.    Historical Provider, MD  doxycycline (VIBRAMYCIN) 100 MG capsule Take 1 capsule (100 mg total) by mouth 2 (two) times daily. 08/27/14   Billy Fischer, MD  FOSRENOL 1000 MG chewable tablet  12/11/12   Historical Provider, MD  MOVIPREP 100 G SOLR moviprep as directed. No substitutions 02/11/13   Gatha Mayer, MD  simvastatin (ZOCOR) 20 MG tablet Take 20 mg by mouth at bedtime.     Historical Provider, MD   BP 166/103 mmHg  Pulse 73  Temp(Src) 98.2 F (36.8 C) (Oral)  Resp 16  SpO2 96% Physical Exam   Constitutional: He is oriented to person, place, and time. He appears well-developed and well-nourished.  Abdominal: Soft. Bowel sounds are normal. There is no tenderness.  Genitourinary: Penis normal. No penile tenderness.  Neurological: He is alert and oriented to person, place, and time.  Skin: Skin is warm and dry.  Nursing note and vitals reviewed.   ED Course  Procedures (including critical care time) Labs Review Labs Reviewed - No data to display  Imaging Review No results found.   MDM   1. Nonspecific urethritis        Billy Fischer, MD 08/27/14 2026

## 2014-08-28 DIAGNOSIS — N2581 Secondary hyperparathyroidism of renal origin: Secondary | ICD-10-CM | POA: Diagnosis not present

## 2014-08-28 DIAGNOSIS — E8770 Fluid overload, unspecified: Secondary | ICD-10-CM | POA: Diagnosis not present

## 2014-08-28 DIAGNOSIS — N186 End stage renal disease: Secondary | ICD-10-CM | POA: Diagnosis not present

## 2014-08-31 DIAGNOSIS — N2581 Secondary hyperparathyroidism of renal origin: Secondary | ICD-10-CM | POA: Diagnosis not present

## 2014-08-31 DIAGNOSIS — E8770 Fluid overload, unspecified: Secondary | ICD-10-CM | POA: Diagnosis not present

## 2014-08-31 DIAGNOSIS — N186 End stage renal disease: Secondary | ICD-10-CM | POA: Diagnosis not present

## 2014-09-02 DIAGNOSIS — E8770 Fluid overload, unspecified: Secondary | ICD-10-CM | POA: Diagnosis not present

## 2014-09-02 DIAGNOSIS — N2581 Secondary hyperparathyroidism of renal origin: Secondary | ICD-10-CM | POA: Diagnosis not present

## 2014-09-02 DIAGNOSIS — N186 End stage renal disease: Secondary | ICD-10-CM | POA: Diagnosis not present

## 2014-09-04 DIAGNOSIS — E8770 Fluid overload, unspecified: Secondary | ICD-10-CM | POA: Diagnosis not present

## 2014-09-04 DIAGNOSIS — N2581 Secondary hyperparathyroidism of renal origin: Secondary | ICD-10-CM | POA: Diagnosis not present

## 2014-09-04 DIAGNOSIS — N186 End stage renal disease: Secondary | ICD-10-CM | POA: Diagnosis not present

## 2014-09-07 DIAGNOSIS — E8770 Fluid overload, unspecified: Secondary | ICD-10-CM | POA: Diagnosis not present

## 2014-09-07 DIAGNOSIS — N186 End stage renal disease: Secondary | ICD-10-CM | POA: Diagnosis not present

## 2014-09-07 DIAGNOSIS — N2581 Secondary hyperparathyroidism of renal origin: Secondary | ICD-10-CM | POA: Diagnosis not present

## 2014-09-08 DIAGNOSIS — N186 End stage renal disease: Secondary | ICD-10-CM | POA: Diagnosis not present

## 2014-09-08 DIAGNOSIS — I120 Hypertensive chronic kidney disease with stage 5 chronic kidney disease or end stage renal disease: Secondary | ICD-10-CM | POA: Diagnosis not present

## 2014-09-08 DIAGNOSIS — Z992 Dependence on renal dialysis: Secondary | ICD-10-CM | POA: Diagnosis not present

## 2014-09-09 DIAGNOSIS — N186 End stage renal disease: Secondary | ICD-10-CM | POA: Diagnosis not present

## 2014-09-09 DIAGNOSIS — N2581 Secondary hyperparathyroidism of renal origin: Secondary | ICD-10-CM | POA: Diagnosis not present

## 2014-09-09 DIAGNOSIS — D631 Anemia in chronic kidney disease: Secondary | ICD-10-CM | POA: Diagnosis not present

## 2014-09-11 DIAGNOSIS — D631 Anemia in chronic kidney disease: Secondary | ICD-10-CM | POA: Diagnosis not present

## 2014-09-11 DIAGNOSIS — N2581 Secondary hyperparathyroidism of renal origin: Secondary | ICD-10-CM | POA: Diagnosis not present

## 2014-09-11 DIAGNOSIS — N186 End stage renal disease: Secondary | ICD-10-CM | POA: Diagnosis not present

## 2014-09-14 DIAGNOSIS — D631 Anemia in chronic kidney disease: Secondary | ICD-10-CM | POA: Diagnosis not present

## 2014-09-14 DIAGNOSIS — N186 End stage renal disease: Secondary | ICD-10-CM | POA: Diagnosis not present

## 2014-09-14 DIAGNOSIS — N2581 Secondary hyperparathyroidism of renal origin: Secondary | ICD-10-CM | POA: Diagnosis not present

## 2014-09-15 ENCOUNTER — Emergency Department (INDEPENDENT_AMBULATORY_CARE_PROVIDER_SITE_OTHER)
Admission: EM | Admit: 2014-09-15 | Discharge: 2014-09-15 | Disposition: A | Discharge status: Home / Self Care | Payer: Medicare Other | Source: Home / Self Care

## 2014-09-15 ENCOUNTER — Encounter (HOSPITAL_COMMUNITY): Payer: Self-pay | Admitting: Emergency Medicine

## 2014-09-15 DIAGNOSIS — N4889 Other specified disorders of penis: Principal | ICD-10-CM

## 2014-09-15 DIAGNOSIS — G8929 Other chronic pain: Secondary | ICD-10-CM

## 2014-09-15 DIAGNOSIS — N489 Disorder of penis, unspecified: Secondary | ICD-10-CM

## 2014-09-15 LAB — POCT URINALYSIS DIP (DEVICE)
BILIRUBIN URINE: NEGATIVE
Glucose, UA: 250 mg/dL — AB
Ketones, ur: NEGATIVE mg/dL
Leukocytes, UA: NEGATIVE
Nitrite: NEGATIVE
Protein, ur: >=300 mg/dL — AB
Specific Gravity, Urine: 1.02 (ref 1.005–1.030)
Urobilinogen, UA: 0.2 mg/dL (ref 0.0–1.0)
pH: 8.5 — ABNORMAL HIGH (ref 5.0–8.0)

## 2014-09-15 NOTE — ED Notes (Signed)
The patient presented to the [facility] with a complaint of urethritis. The patient stated that he was seen on 08/11/2014 and diagnosed with urethritis and prescribed doxycycline. The patient stated that he still feels pain in the penile area.

## 2014-09-15 NOTE — ED Provider Notes (Signed)
CSN: [id number]     Arrival date & time 09/15/14  1901 History   None    Chief Complaint  Patient presents with  . urethritis    (Consider location/radiation/quality/duration/timing/severity/associated sxs/prior Treatment)  HPI   Patient is a 59 year old male presenting today with complaint of penile pain. Patient is requesting refill of his doxycycline from his previous visit and treatment for urethritis. Patient states he just feels like it "hasn't quite healed up yet". Patient states he feels that it is all his fault as he is having occasional unprotected sex. Patient is on renal dialysis and does not void frequently. Denies testicular or scrotal pain.  Past Medical History  Diagnosis Date  . CRF (chronic renal failure)   . Hypertension   . Family history of anesthesia complication     [place] going to sleep.  . Parathyroid disease     Hx of , No meds at present  . 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  . Gout   . Arthritis   . Hyperlipidemia   . Anemia   . Hyperuricemia   . MRSA (methicillin resistant staph aureus) culture positive 07/2011  . Tobacco abuse   . Hx of cocaine abuse    Past Surgical History  Procedure Laterality Date  . Av fistula placement Left 2010  . Appendectomy  1980  . Mandible fracture surgery    . Angioplasty  08/09/2011    Procedure: ANGIOPLASTY;  Surgeon: Angelia Mould, MD;  Location: [facility];  Service: Vascular;  Laterality: Left;  Left Braciocephalic Fistula using Vascu-Guard Patch  . Incision and drainage  12/2012    sebacebus cyst, infected  . [name] N/A 07/16/2011    Procedure: Earney Mallet;  Surgeon: Angelia Mould, MD;  Location: [facility] CATH LAB;  Service: Cardiovascular;  Laterality: N/A;   Family History  Problem Relation Age of Onset  . Diabetes Father   . Heart disease Father   . Hypertension Father   . Other Father     amputation  . Diabetes Sister   . Heart disease Sister   . Colon cancer Neg  Hx    Social History  Substance Use Topics  . Smoking status: Current Some Day Smoker -- 0.10 packs/day for 30 years    Types: Cigarettes  . Smokeless tobacco: Never Used     Comment: pt states that he is aware of how to quit  . Alcohol Use: 2.4 oz/week    4 Cans of beer per week     Comment: occasional    Review of Systems  Constitutional: Negative.   HENT: Negative.   Eyes: Negative.   Respiratory: Negative.   Cardiovascular: Negative.   Gastrointestinal: Negative.   Endocrine: Negative.   Genitourinary: Positive for penile pain. Negative for hematuria, discharge, penile swelling, scrotal swelling, genital sores and testicular pain.  Musculoskeletal: Negative.   Skin: Negative.   Allergic/Immunologic: Negative.   Neurological: Negative.   Hematological: Negative.   Psychiatric/Behavioral: Negative.     Allergies  Review of patient's allergies indicates no known allergies.  Home Medications   Prior to Admission medications   Medication Sig Start Date End Date Taking? Authorizing Provider  allopurinol (ZYLOPRIM) 100 MG tablet Take 200 mg by mouth daily.  05/18/11   Historical Provider, MD  atenolol (TENORMIN) 50 MG tablet Take 100 mg by mouth daily.     Historical Provider, MD  calcium acetate (PHOSLO) 667 MG capsule  12/10/12   Historical Provider,  MD  Calcium Carbonate-Vitamin D (CALCIUM 500/D PO) Take 2 tablets by mouth 3 (three) times daily.     Historical Provider, MD  cloNIDine (CATAPRES) 0.2 MG tablet Take 0.2 mg by mouth 2 (two) times daily.     Historical Provider, MD  doxazosin (CARDURA) 8 MG tablet Take 8 mg by mouth at bedtime.    Historical Provider, MD  doxycycline (VIBRAMYCIN) 100 MG capsule Take 1 capsule (100 mg total) by mouth 2 (two) times daily. 08/27/14   Billy Fischer, MD  FOSRENOL 1000 MG chewable tablet  12/11/12   Historical Provider, MD  MOVIPREP 100 G SOLR moviprep as directed. No substitutions 02/11/13   Gatha Mayer, MD  simvastatin (ZOCOR) 20 MG  tablet Take 20 mg by mouth at bedtime.     Historical Provider, MD   Meds Ordered and Administered this Visit  Medications - No data to display  BP 154/86 mmHg  Pulse 69  Temp(Src) 97.5 F (36.4 C) (Oral)  Resp 20  SpO2 96% No data found.   Physical Exam  Constitutional: He appears well-developed and well-nourished. No distress.  Cardiovascular: Normal rate, regular rhythm, normal heart sounds and intact distal pulses.  Exam reveals no gallop and no friction rub.   No murmur heard. Pulmonary/Chest: Effort normal and breath sounds normal. No respiratory distress. He has no wheezes. He has no rales. He exhibits no tenderness.  Skin: Skin is warm and dry. He is not diaphoretic.  Nursing note and vitals reviewed.   ED Course  Procedures (including critical care time)  Labs Review Labs Reviewed  POCT URINALYSIS DIP (DEVICE) - Abnormal; Notable for the following:    Glucose, UA 250 (*)    Hgb urine dipstick MODERATE (*)    pH 8.5 (*)    Protein, ur >=300 (*)    All other components within normal limits   Results for orders placed or performed during the hospital encounter of 09/15/14  POCT urinalysis dip (device)  Result Value Ref Range   Glucose, UA 250 (A) NEGATIVE mg/dL   Bilirubin Urine NEGATIVE NEGATIVE   Ketones, ur NEGATIVE NEGATIVE mg/dL   Specific Gravity, Urine 1.020 1.005 - 1.030   Hgb urine dipstick MODERATE (A) NEGATIVE   pH 8.5 (H) 5.0 - 8.0   Protein, ur >=300 (A) NEGATIVE mg/dL   Urobilinogen, UA 0.2 0.0 - 1.0 mg/dL   Nitrite NEGATIVE NEGATIVE   Leukocytes, UA NEGATIVE NEGATIVE    Imaging Review No results found.  Plan of care was discussed with Dr. Juventino Slovak.    MDM   1. Penile pain, chronic    Patient was referred to [facility] for follow-up. No evidence of UTI at this time.  Advised patient that we could no longer continue to prescribe antibiotics for a chronic condition.  The patient verbalizes understanding and agrees to plan of care.        Nehemiah Settle, NP 09/15/14 2050

## 2014-09-15 NOTE — Discharge Instructions (Signed)
Urethritis Urethritis is an inflammation of the tube through which urine exits your bladder (urethra).  CAUSES Urethritis is often caused by an infection in your urethra. The infection can be viral, like herpes. The infection can also be bacterial, like gonorrhea. RISK FACTORS Risk factors of urethritis include:  Having sex without using a condom.  Having multiple sexual partners.  Having poor hygiene. SIGNS AND SYMPTOMS Symptoms of urethritis are less noticeable in women than in men. These symptoms include:  Burning feeling when you urinate (dysuria).  Discharge from your urethra.  Blood in your urine (hematuria).  Urinating more than usual. DIAGNOSIS  To confirm a diagnosis of urethritis, your health care provider will do the following:  Ask about your sexual history.  Perform a physical exam.  Have you provide a sample of your urine for lab testing.  Use a cotton swab to gently collect a sample from your urethra for lab testing. TREATMENT  It is important to treat urethritis. Depending on the cause, untreated urethritis may lead to serious genital infections and possibly infertility. Urethritis caused by a bacterial infection is treated with antibiotic medicine. All sexual partners must be treated.  HOME CARE INSTRUCTIONS  Do not have sex until the test results are known and treatment is completed, even if your symptoms go away before you finish treatment.  If you were prescribed an antibiotic, finish it all even if you start to feel better. SEEK MEDICAL CARE IF:   Your symptoms are not improved in 3 days.  Your symptoms are getting worse.  You develop abdominal pain or pelvic pain (in women).  You develop joint pain.  You have a fever. SEEK IMMEDIATE MEDICAL CARE IF:   You have severe pain in the belly, back, or side.  You have repeated vomiting. MAKE SURE YOU:  Understand these instructions.  Will watch your condition.  Will get help right away if you  are not doing well or get worse. Document Released: 06/20/2000 Document Revised: 05/11/2013 Document Reviewed: 08/25/2012 [facility] Patient Information 2015 [place], [place]. This information is not intended to replace advice given to you by your health care provider. Make sure you discuss any questions you have with your health care provider.

## 2014-09-16 DIAGNOSIS — N186 End stage renal disease: Secondary | ICD-10-CM | POA: Diagnosis not present

## 2014-09-16 DIAGNOSIS — N2581 Secondary hyperparathyroidism of renal origin: Secondary | ICD-10-CM | POA: Diagnosis not present

## 2014-09-16 DIAGNOSIS — D631 Anemia in chronic kidney disease: Secondary | ICD-10-CM | POA: Diagnosis not present

## 2014-09-18 DIAGNOSIS — D631 Anemia in chronic kidney disease: Secondary | ICD-10-CM | POA: Diagnosis not present

## 2014-09-18 DIAGNOSIS — N2581 Secondary hyperparathyroidism of renal origin: Secondary | ICD-10-CM | POA: Diagnosis not present

## 2014-09-18 DIAGNOSIS — N186 End stage renal disease: Secondary | ICD-10-CM | POA: Diagnosis not present

## 2014-09-21 DIAGNOSIS — N2581 Secondary hyperparathyroidism of renal origin: Secondary | ICD-10-CM | POA: Diagnosis not present

## 2014-09-21 DIAGNOSIS — N186 End stage renal disease: Secondary | ICD-10-CM | POA: Diagnosis not present

## 2014-09-21 DIAGNOSIS — D631 Anemia in chronic kidney disease: Secondary | ICD-10-CM | POA: Diagnosis not present

## 2014-09-23 DIAGNOSIS — N2581 Secondary hyperparathyroidism of renal origin: Secondary | ICD-10-CM | POA: Diagnosis not present

## 2014-09-23 DIAGNOSIS — N186 End stage renal disease: Secondary | ICD-10-CM | POA: Diagnosis not present

## 2014-09-23 DIAGNOSIS — D631 Anemia in chronic kidney disease: Secondary | ICD-10-CM | POA: Diagnosis not present

## 2014-09-25 DIAGNOSIS — N2581 Secondary hyperparathyroidism of renal origin: Secondary | ICD-10-CM | POA: Diagnosis not present

## 2014-09-25 DIAGNOSIS — D631 Anemia in chronic kidney disease: Secondary | ICD-10-CM | POA: Diagnosis not present

## 2014-09-25 DIAGNOSIS — N186 End stage renal disease: Secondary | ICD-10-CM | POA: Diagnosis not present

## 2014-09-28 DIAGNOSIS — D631 Anemia in chronic kidney disease: Secondary | ICD-10-CM | POA: Diagnosis not present

## 2014-09-28 DIAGNOSIS — N2581 Secondary hyperparathyroidism of renal origin: Secondary | ICD-10-CM | POA: Diagnosis not present

## 2014-09-28 DIAGNOSIS — N186 End stage renal disease: Secondary | ICD-10-CM | POA: Diagnosis not present

## 2014-09-30 DIAGNOSIS — N186 End stage renal disease: Secondary | ICD-10-CM | POA: Diagnosis not present

## 2014-09-30 DIAGNOSIS — N2581 Secondary hyperparathyroidism of renal origin: Secondary | ICD-10-CM | POA: Diagnosis not present

## 2014-09-30 DIAGNOSIS — D631 Anemia in chronic kidney disease: Secondary | ICD-10-CM | POA: Diagnosis not present

## 2014-10-02 DIAGNOSIS — N186 End stage renal disease: Secondary | ICD-10-CM | POA: Diagnosis not present

## 2014-10-02 DIAGNOSIS — N2581 Secondary hyperparathyroidism of renal origin: Secondary | ICD-10-CM | POA: Diagnosis not present

## 2014-10-02 DIAGNOSIS — D631 Anemia in chronic kidney disease: Secondary | ICD-10-CM | POA: Diagnosis not present

## 2014-10-05 DIAGNOSIS — N186 End stage renal disease: Secondary | ICD-10-CM | POA: Diagnosis not present

## 2014-10-05 DIAGNOSIS — D631 Anemia in chronic kidney disease: Secondary | ICD-10-CM | POA: Diagnosis not present

## 2014-10-05 DIAGNOSIS — N2581 Secondary hyperparathyroidism of renal origin: Secondary | ICD-10-CM | POA: Diagnosis not present

## 2014-10-07 DIAGNOSIS — N186 End stage renal disease: Secondary | ICD-10-CM | POA: Diagnosis not present

## 2014-10-07 DIAGNOSIS — N2581 Secondary hyperparathyroidism of renal origin: Secondary | ICD-10-CM | POA: Diagnosis not present

## 2014-10-07 DIAGNOSIS — D631 Anemia in chronic kidney disease: Secondary | ICD-10-CM | POA: Diagnosis not present

## 2014-10-08 DIAGNOSIS — I120 Hypertensive chronic kidney disease with stage 5 chronic kidney disease or end stage renal disease: Secondary | ICD-10-CM | POA: Diagnosis not present

## 2014-10-09 DIAGNOSIS — N186 End stage renal disease: Secondary | ICD-10-CM | POA: Diagnosis not present

## 2014-10-09 DIAGNOSIS — Z23 Encounter for immunization: Secondary | ICD-10-CM | POA: Diagnosis not present

## 2014-10-09 DIAGNOSIS — N2581 Secondary hyperparathyroidism of renal origin: Secondary | ICD-10-CM | POA: Diagnosis not present

## 2014-10-12 DIAGNOSIS — N2581 Secondary hyperparathyroidism of renal origin: Secondary | ICD-10-CM | POA: Diagnosis not present

## 2014-10-12 DIAGNOSIS — N186 End stage renal disease: Secondary | ICD-10-CM | POA: Diagnosis not present

## 2014-10-12 DIAGNOSIS — Z23 Encounter for immunization: Secondary | ICD-10-CM | POA: Diagnosis not present

## 2014-10-14 DIAGNOSIS — Z23 Encounter for immunization: Secondary | ICD-10-CM | POA: Diagnosis not present

## 2014-10-14 DIAGNOSIS — N186 End stage renal disease: Secondary | ICD-10-CM | POA: Diagnosis not present

## 2014-10-14 DIAGNOSIS — N2581 Secondary hyperparathyroidism of renal origin: Secondary | ICD-10-CM | POA: Diagnosis not present

## 2014-10-16 DIAGNOSIS — Z23 Encounter for immunization: Secondary | ICD-10-CM | POA: Diagnosis not present

## 2014-10-16 DIAGNOSIS — N2581 Secondary hyperparathyroidism of renal origin: Secondary | ICD-10-CM | POA: Diagnosis not present

## 2014-10-16 DIAGNOSIS — N186 End stage renal disease: Secondary | ICD-10-CM | POA: Diagnosis not present

## 2014-10-19 DIAGNOSIS — N2581 Secondary hyperparathyroidism of renal origin: Secondary | ICD-10-CM | POA: Diagnosis not present

## 2014-10-19 DIAGNOSIS — N186 End stage renal disease: Secondary | ICD-10-CM | POA: Diagnosis not present

## 2014-10-19 DIAGNOSIS — Z23 Encounter for immunization: Secondary | ICD-10-CM | POA: Diagnosis not present

## 2014-10-21 DIAGNOSIS — Z23 Encounter for immunization: Secondary | ICD-10-CM | POA: Diagnosis not present

## 2014-10-21 DIAGNOSIS — N186 End stage renal disease: Secondary | ICD-10-CM | POA: Diagnosis not present

## 2014-10-21 DIAGNOSIS — N2581 Secondary hyperparathyroidism of renal origin: Secondary | ICD-10-CM | POA: Diagnosis not present

## 2014-10-23 DIAGNOSIS — N2581 Secondary hyperparathyroidism of renal origin: Secondary | ICD-10-CM | POA: Diagnosis not present

## 2014-10-23 DIAGNOSIS — N186 End stage renal disease: Secondary | ICD-10-CM | POA: Diagnosis not present

## 2014-10-23 DIAGNOSIS — Z23 Encounter for immunization: Secondary | ICD-10-CM | POA: Diagnosis not present

## 2014-10-26 DIAGNOSIS — N186 End stage renal disease: Secondary | ICD-10-CM | POA: Diagnosis not present

## 2014-10-26 DIAGNOSIS — Z23 Encounter for immunization: Secondary | ICD-10-CM | POA: Diagnosis not present

## 2014-10-26 DIAGNOSIS — N2581 Secondary hyperparathyroidism of renal origin: Secondary | ICD-10-CM | POA: Diagnosis not present

## 2014-10-28 DIAGNOSIS — N186 End stage renal disease: Secondary | ICD-10-CM | POA: Diagnosis not present

## 2014-10-28 DIAGNOSIS — N2581 Secondary hyperparathyroidism of renal origin: Secondary | ICD-10-CM | POA: Diagnosis not present

## 2014-10-28 DIAGNOSIS — Z23 Encounter for immunization: Secondary | ICD-10-CM | POA: Diagnosis not present

## 2014-10-30 DIAGNOSIS — Z23 Encounter for immunization: Secondary | ICD-10-CM | POA: Diagnosis not present

## 2014-10-30 DIAGNOSIS — N2581 Secondary hyperparathyroidism of renal origin: Secondary | ICD-10-CM | POA: Diagnosis not present

## 2014-10-30 DIAGNOSIS — N186 End stage renal disease: Secondary | ICD-10-CM | POA: Diagnosis not present

## 2014-11-02 DIAGNOSIS — N186 End stage renal disease: Secondary | ICD-10-CM | POA: Diagnosis not present

## 2014-11-02 DIAGNOSIS — Z23 Encounter for immunization: Secondary | ICD-10-CM | POA: Diagnosis not present

## 2014-11-02 DIAGNOSIS — N2581 Secondary hyperparathyroidism of renal origin: Secondary | ICD-10-CM | POA: Diagnosis not present

## 2014-11-04 DIAGNOSIS — N186 End stage renal disease: Secondary | ICD-10-CM | POA: Diagnosis not present

## 2014-11-04 DIAGNOSIS — N2581 Secondary hyperparathyroidism of renal origin: Secondary | ICD-10-CM | POA: Diagnosis not present

## 2014-11-04 DIAGNOSIS — Z23 Encounter for immunization: Secondary | ICD-10-CM | POA: Diagnosis not present

## 2014-11-06 DIAGNOSIS — Z23 Encounter for immunization: Secondary | ICD-10-CM | POA: Diagnosis not present

## 2014-11-06 DIAGNOSIS — N186 End stage renal disease: Secondary | ICD-10-CM | POA: Diagnosis not present

## 2014-11-06 DIAGNOSIS — N2581 Secondary hyperparathyroidism of renal origin: Secondary | ICD-10-CM | POA: Diagnosis not present

## 2014-11-08 DIAGNOSIS — N186 End stage renal disease: Secondary | ICD-10-CM | POA: Diagnosis not present

## 2014-11-08 DIAGNOSIS — I120 Hypertensive chronic kidney disease with stage 5 chronic kidney disease or end stage renal disease: Secondary | ICD-10-CM | POA: Diagnosis not present

## 2014-11-08 DIAGNOSIS — Z992 Dependence on renal dialysis: Secondary | ICD-10-CM | POA: Diagnosis not present

## 2014-11-09 DIAGNOSIS — N186 End stage renal disease: Secondary | ICD-10-CM | POA: Diagnosis not present

## 2014-11-09 DIAGNOSIS — N2581 Secondary hyperparathyroidism of renal origin: Secondary | ICD-10-CM | POA: Diagnosis not present

## 2014-11-10 DIAGNOSIS — Z992 Dependence on renal dialysis: Secondary | ICD-10-CM | POA: Diagnosis not present

## 2014-11-10 DIAGNOSIS — I871 Compression of vein: Secondary | ICD-10-CM | POA: Diagnosis not present

## 2014-11-10 DIAGNOSIS — T82858D Stenosis of vascular prosthetic devices, implants and grafts, subsequent encounter: Secondary | ICD-10-CM | POA: Diagnosis not present

## 2014-11-10 DIAGNOSIS — N186 End stage renal disease: Secondary | ICD-10-CM | POA: Diagnosis not present

## 2014-11-11 DIAGNOSIS — N186 End stage renal disease: Secondary | ICD-10-CM | POA: Diagnosis not present

## 2014-11-11 DIAGNOSIS — N2581 Secondary hyperparathyroidism of renal origin: Secondary | ICD-10-CM | POA: Diagnosis not present

## 2014-11-13 ENCOUNTER — Encounter (HOSPITAL_COMMUNITY): Payer: Self-pay | Admitting: *Deleted

## 2014-11-13 ENCOUNTER — Emergency Department (HOSPITAL_COMMUNITY)
Admission: EM | Admit: 2014-11-13 | Discharge: 2014-11-13 | Disposition: A | Discharge status: Home / Self Care | Payer: Medicare Other | Attending: Emergency Medicine | Admitting: Emergency Medicine

## 2014-11-13 DIAGNOSIS — N2581 Secondary hyperparathyroidism of renal origin: Secondary | ICD-10-CM | POA: Diagnosis not present

## 2014-11-13 DIAGNOSIS — N189 Chronic kidney disease, unspecified: Secondary | ICD-10-CM | POA: Insufficient documentation

## 2014-11-13 DIAGNOSIS — M1990 Unspecified osteoarthritis, unspecified site: Secondary | ICD-10-CM | POA: Diagnosis not present

## 2014-11-13 DIAGNOSIS — Z720 Tobacco use: Secondary | ICD-10-CM | POA: Insufficient documentation

## 2014-11-13 DIAGNOSIS — E785 Hyperlipidemia, unspecified: Secondary | ICD-10-CM | POA: Insufficient documentation

## 2014-11-13 DIAGNOSIS — Z8614 Personal history of Methicillin resistant Staphylococcus aureus infection: Secondary | ICD-10-CM | POA: Insufficient documentation

## 2014-11-13 DIAGNOSIS — Z8611 Personal history of tuberculosis: Secondary | ICD-10-CM | POA: Insufficient documentation

## 2014-11-13 DIAGNOSIS — N5082 Scrotal pain: Secondary | ICD-10-CM | POA: Insufficient documentation

## 2014-11-13 DIAGNOSIS — Z79899 Other long term (current) drug therapy: Secondary | ICD-10-CM | POA: Diagnosis not present

## 2014-11-13 DIAGNOSIS — R1030 Lower abdominal pain, unspecified: Secondary | ICD-10-CM | POA: Diagnosis present

## 2014-11-13 DIAGNOSIS — N186 End stage renal disease: Secondary | ICD-10-CM | POA: Diagnosis not present

## 2014-11-13 DIAGNOSIS — I129 Hypertensive chronic kidney disease with stage 1 through stage 4 chronic kidney disease, or unspecified chronic kidney disease: Secondary | ICD-10-CM | POA: Diagnosis not present

## 2014-11-13 DIAGNOSIS — M109 Gout, unspecified: Secondary | ICD-10-CM | POA: Diagnosis not present

## 2014-11-13 MED ORDER — DOXYCYCLINE HYCLATE 100 MG PO CAPS: ORAL_CAPSULE | ORAL | Status: DC

## 2014-11-13 NOTE — ED Provider Notes (Signed)
CSN: [id number]     Arrival date & time 11/13/14  1908 History   First MD Initiated Contact with Patient 11/13/14 2036     Chief Complaint  Patient presents with  . Groin Pain     HPI  Patient presents for recurrence of scrotal pain, onset several days ago and it is worsening No trauma No fever No penile discharge He reports recent unprotected sexual intercourse  Pt reports treated for epididymitis last summer and multiple rounds of antibiotics He reports that treatment he had sexual intercourse and also drank ETOH and thinks meds didn't work He wants more antibiotics He reports receiving dialysis today    Past Medical History  Diagnosis Date  . CRF (chronic renal failure)   . Hypertension   . Family history of anesthesia complication     [place] going to sleep.  . Parathyroid disease ([place])     Hx of , No meds at present  . 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  . Gout   . Arthritis   . Hyperlipidemia   . Anemia   . Hyperuricemia   . MRSA (methicillin resistant staph aureus) culture positive 07/2011  . Tobacco abuse   . Hx of cocaine abuse    Past Surgical History  Procedure Laterality Date  . Av fistula placement Left 2010  . Appendectomy  1980  . Mandible fracture surgery    . Angioplasty  08/09/2011    Procedure: ANGIOPLASTY;  Surgeon: Angelia Mould, MD;  Location: [facility];  Service: Vascular;  Laterality: Left;  Left Braciocephalic Fistula using Vascu-Guard Patch  . Incision and drainage  12/2012    sebacebus cyst, infected  . [name] N/A 07/16/2011    Procedure: Earney Mallet;  Surgeon: Angelia Mould, MD;  Location[facility] CATH LAB;  Service: Cardiovascular;  Laterality: N/A;   Family History  Problem Relation Age of Onset  . Diabetes Father   . Heart disease Father   . Hypertension Father   . Other Father     amputation  . Diabetes Sister   . Heart disease Sister   . Colon cancer Neg Hx    Social History  Substance  Use Topics  . Smoking status: Current Some Day Smoker -- 0.10 packs/day for 30 years    Types: Cigarettes  . Smokeless tobacco: Never Used     Comment: pt states that he is aware of how to quit  . Alcohol Use: 2.4 oz/week    4 Cans of beer per week     Comment: occasional    Review of Systems  Constitutional: Negative for fever.  Gastrointestinal: Negative for abdominal pain.  Genitourinary: Negative for discharge.  Musculoskeletal: Negative for back pain.      Allergies  Review of patient's allergies indicates no known allergies.  Home Medications   Prior to Admission medications   Medication Sig Start Date End Date Taking? Authorizing Provider  allopurinol (ZYLOPRIM) 100 MG tablet Take 200 mg by mouth daily.  05/18/11   Historical Provider, MD  atenolol (TENORMIN) 50 MG tablet Take 100 mg by mouth daily.     Historical Provider, MD  calcium acetate (PHOSLO) 667 MG capsule  12/10/12   Historical Provider, MD  Calcium Carbonate-Vitamin D (CALCIUM 500/D PO) Take 2 tablets by mouth 3 (three) times daily.     Historical Provider, MD  cloNIDine (CATAPRES) 0.2 MG tablet Take 0.2 mg by mouth 2 (two) times daily.     Historical  Provider, MD  doxazosin (CARDURA) 8 MG tablet Take 8 mg by mouth at bedtime.    Historical Provider, MD  doxycycline (VIBRAMYCIN) 100 MG capsule One po bid x 10 days 11/13/14   Ripley Fraise, MD  FOSRENOL 1000 MG chewable tablet  12/11/12   Historical Provider, MD  MOVIPREP 100 G SOLR moviprep as directed. No substitutions 02/11/13   Gatha Mayer, MD  simvastatin (ZOCOR) 20 MG tablet Take 20 mg by mouth at bedtime.     Historical Provider, MD   BP 133/70 mmHg  Pulse 72  Temp(Src) 97.3 F (36.3 C) (Oral)  Resp 16  Ht 5\' 11"  (1.803 m)  Wt 244 lb (110.678 kg)  BMI 34.05 kg/m2  SpO2 100% Physical Exam CONSTITUTIONAL: Well developed/well nourished, well appearing, he has headphones on listening to music HEAD: Normocephalic/atraumatic EYES: EOMI ENMT: Mucous  membranes moist NECK: supple no meningeal signs CV: S1/S2 noted LUNGS: Lungs are clear to auscultation bilaterally ABDOMEN: soft, nontender GU:no cva tenderness, no scrotal tenderness/edema. No testicular tenderness noted.  No hernia noted.  No penile discharge/lesions noted.  Male nurse Erin at bedside of exam NEURO: Pt is awake/alert/appropriate, moves all extremitiesx4.   EXTREMITIES: pulses normal/equal, full ROM SKIN: warm, color normal PSYCH: no abnormalities of mood noted, alert and oriented to situation  ED Course  Procedures   Pt here for recent scrotal pain He has none at this time He feels he may have recurrence of infection He requests antibiotics Will put him back on doxycycline (he has tolerated before) and I asked him to call urology for f/u this week  He had multiple evaluations earlier this year and rounds of antibiotics He had negative GC/chlamydia at that time He reports recent unprotected intercourse Advised abstain from sex and see urology as outpatient  MDM   Final diagnoses:  Scrotal pain    Nursing notes including past medical history and social history reviewed and considered in documentation Previous records reviewed and considered     Ripley Fraise, MD 11/13/14 2059

## 2014-11-13 NOTE — ED Notes (Signed)
Pt. Left with all belongings and refused wheelchair. Discharge instructions were reviewed and all questions were answered.  

## 2014-11-13 NOTE — ED Notes (Signed)
The pt a dialysis pt dialyzed today fistula lt arm. He was seen here in the past week  He was given an antibiotic and he thinks that the   Antibiotic may not have helped because he drank alcohol.

## 2014-11-16 DIAGNOSIS — N2581 Secondary hyperparathyroidism of renal origin: Secondary | ICD-10-CM | POA: Diagnosis not present

## 2014-11-16 DIAGNOSIS — N186 End stage renal disease: Secondary | ICD-10-CM | POA: Diagnosis not present

## 2014-11-18 DIAGNOSIS — N186 End stage renal disease: Secondary | ICD-10-CM | POA: Diagnosis not present

## 2014-11-18 DIAGNOSIS — N2581 Secondary hyperparathyroidism of renal origin: Secondary | ICD-10-CM | POA: Diagnosis not present

## 2014-11-20 DIAGNOSIS — N2581 Secondary hyperparathyroidism of renal origin: Secondary | ICD-10-CM | POA: Diagnosis not present

## 2014-11-20 DIAGNOSIS — N186 End stage renal disease: Secondary | ICD-10-CM | POA: Diagnosis not present

## 2014-11-23 DIAGNOSIS — N2581 Secondary hyperparathyroidism of renal origin: Secondary | ICD-10-CM | POA: Diagnosis not present

## 2014-11-23 DIAGNOSIS — N186 End stage renal disease: Secondary | ICD-10-CM | POA: Diagnosis not present

## 2014-11-25 DIAGNOSIS — N186 End stage renal disease: Secondary | ICD-10-CM | POA: Diagnosis not present

## 2014-11-25 DIAGNOSIS — N2581 Secondary hyperparathyroidism of renal origin: Secondary | ICD-10-CM | POA: Diagnosis not present

## 2014-11-27 DIAGNOSIS — N186 End stage renal disease: Secondary | ICD-10-CM | POA: Diagnosis not present

## 2014-11-27 DIAGNOSIS — N2581 Secondary hyperparathyroidism of renal origin: Secondary | ICD-10-CM | POA: Diagnosis not present

## 2014-11-30 DIAGNOSIS — N2581 Secondary hyperparathyroidism of renal origin: Secondary | ICD-10-CM | POA: Diagnosis not present

## 2014-11-30 DIAGNOSIS — N186 End stage renal disease: Secondary | ICD-10-CM | POA: Diagnosis not present

## 2014-12-03 DIAGNOSIS — N186 End stage renal disease: Secondary | ICD-10-CM | POA: Diagnosis not present

## 2014-12-03 DIAGNOSIS — N2581 Secondary hyperparathyroidism of renal origin: Secondary | ICD-10-CM | POA: Diagnosis not present

## 2014-12-05 DIAGNOSIS — N2581 Secondary hyperparathyroidism of renal origin: Secondary | ICD-10-CM | POA: Diagnosis not present

## 2014-12-05 DIAGNOSIS — N186 End stage renal disease: Secondary | ICD-10-CM | POA: Diagnosis not present

## 2014-12-07 DIAGNOSIS — N186 End stage renal disease: Secondary | ICD-10-CM | POA: Diagnosis not present

## 2014-12-07 DIAGNOSIS — N2581 Secondary hyperparathyroidism of renal origin: Secondary | ICD-10-CM | POA: Diagnosis not present

## 2014-12-08 DIAGNOSIS — N186 End stage renal disease: Secondary | ICD-10-CM | POA: Diagnosis not present

## 2014-12-08 DIAGNOSIS — I120 Hypertensive chronic kidney disease with stage 5 chronic kidney disease or end stage renal disease: Secondary | ICD-10-CM | POA: Diagnosis not present

## 2014-12-08 DIAGNOSIS — Z992 Dependence on renal dialysis: Secondary | ICD-10-CM | POA: Diagnosis not present

## 2014-12-09 DIAGNOSIS — N2581 Secondary hyperparathyroidism of renal origin: Secondary | ICD-10-CM | POA: Diagnosis not present

## 2014-12-09 DIAGNOSIS — N186 End stage renal disease: Secondary | ICD-10-CM | POA: Diagnosis not present

## 2014-12-11 DIAGNOSIS — N186 End stage renal disease: Secondary | ICD-10-CM | POA: Diagnosis not present

## 2014-12-11 DIAGNOSIS — N2581 Secondary hyperparathyroidism of renal origin: Secondary | ICD-10-CM | POA: Diagnosis not present

## 2014-12-14 DIAGNOSIS — N186 End stage renal disease: Secondary | ICD-10-CM | POA: Diagnosis not present

## 2014-12-14 DIAGNOSIS — N2581 Secondary hyperparathyroidism of renal origin: Secondary | ICD-10-CM | POA: Diagnosis not present

## 2014-12-16 DIAGNOSIS — N186 End stage renal disease: Secondary | ICD-10-CM | POA: Diagnosis not present

## 2014-12-16 DIAGNOSIS — N2581 Secondary hyperparathyroidism of renal origin: Secondary | ICD-10-CM | POA: Diagnosis not present

## 2014-12-18 DIAGNOSIS — N186 End stage renal disease: Secondary | ICD-10-CM | POA: Diagnosis not present

## 2014-12-18 DIAGNOSIS — N2581 Secondary hyperparathyroidism of renal origin: Secondary | ICD-10-CM | POA: Diagnosis not present

## 2014-12-21 DIAGNOSIS — N2581 Secondary hyperparathyroidism of renal origin: Secondary | ICD-10-CM | POA: Diagnosis not present

## 2014-12-21 DIAGNOSIS — N186 End stage renal disease: Secondary | ICD-10-CM | POA: Diagnosis not present

## 2014-12-23 DIAGNOSIS — N2581 Secondary hyperparathyroidism of renal origin: Secondary | ICD-10-CM | POA: Diagnosis not present

## 2014-12-23 DIAGNOSIS — N186 End stage renal disease: Secondary | ICD-10-CM | POA: Diagnosis not present

## 2014-12-25 DIAGNOSIS — N2581 Secondary hyperparathyroidism of renal origin: Secondary | ICD-10-CM | POA: Diagnosis not present

## 2014-12-25 DIAGNOSIS — N186 End stage renal disease: Secondary | ICD-10-CM | POA: Diagnosis not present

## 2014-12-28 DIAGNOSIS — N186 End stage renal disease: Secondary | ICD-10-CM | POA: Diagnosis not present

## 2014-12-28 DIAGNOSIS — N2581 Secondary hyperparathyroidism of renal origin: Secondary | ICD-10-CM | POA: Diagnosis not present

## 2014-12-30 DIAGNOSIS — N2581 Secondary hyperparathyroidism of renal origin: Secondary | ICD-10-CM | POA: Diagnosis not present

## 2014-12-30 DIAGNOSIS — N186 End stage renal disease: Secondary | ICD-10-CM | POA: Diagnosis not present

## 2015-01-01 DIAGNOSIS — N2581 Secondary hyperparathyroidism of renal origin: Secondary | ICD-10-CM | POA: Diagnosis not present

## 2015-01-01 DIAGNOSIS — N186 End stage renal disease: Secondary | ICD-10-CM | POA: Diagnosis not present

## 2015-01-04 DIAGNOSIS — N2581 Secondary hyperparathyroidism of renal origin: Secondary | ICD-10-CM | POA: Diagnosis not present

## 2015-01-04 DIAGNOSIS — N186 End stage renal disease: Secondary | ICD-10-CM | POA: Diagnosis not present

## 2015-01-05 DIAGNOSIS — Z992 Dependence on renal dialysis: Secondary | ICD-10-CM | POA: Diagnosis not present

## 2015-01-05 DIAGNOSIS — I871 Compression of vein: Secondary | ICD-10-CM | POA: Diagnosis not present

## 2015-01-05 DIAGNOSIS — I771 Stricture of artery: Secondary | ICD-10-CM | POA: Diagnosis not present

## 2015-01-05 DIAGNOSIS — T82858D Stenosis of vascular prosthetic devices, implants and grafts, subsequent encounter: Secondary | ICD-10-CM | POA: Diagnosis not present

## 2015-01-05 DIAGNOSIS — N186 End stage renal disease: Secondary | ICD-10-CM | POA: Diagnosis not present

## 2015-01-06 DIAGNOSIS — N2581 Secondary hyperparathyroidism of renal origin: Secondary | ICD-10-CM | POA: Diagnosis not present

## 2015-01-06 DIAGNOSIS — N186 End stage renal disease: Secondary | ICD-10-CM | POA: Diagnosis not present

## 2015-01-08 DIAGNOSIS — N2581 Secondary hyperparathyroidism of renal origin: Secondary | ICD-10-CM | POA: Diagnosis not present

## 2015-01-08 DIAGNOSIS — Z992 Dependence on renal dialysis: Secondary | ICD-10-CM | POA: Diagnosis not present

## 2015-01-08 DIAGNOSIS — N186 End stage renal disease: Secondary | ICD-10-CM | POA: Diagnosis not present

## 2015-01-08 DIAGNOSIS — I120 Hypertensive chronic kidney disease with stage 5 chronic kidney disease or end stage renal disease: Secondary | ICD-10-CM | POA: Diagnosis not present

## 2015-01-11 DIAGNOSIS — N2581 Secondary hyperparathyroidism of renal origin: Secondary | ICD-10-CM | POA: Diagnosis not present

## 2015-01-11 DIAGNOSIS — D509 Iron deficiency anemia, unspecified: Secondary | ICD-10-CM | POA: Diagnosis not present

## 2015-01-11 DIAGNOSIS — N186 End stage renal disease: Secondary | ICD-10-CM | POA: Diagnosis not present

## 2015-01-13 DIAGNOSIS — N2581 Secondary hyperparathyroidism of renal origin: Secondary | ICD-10-CM | POA: Diagnosis not present

## 2015-01-13 DIAGNOSIS — N186 End stage renal disease: Secondary | ICD-10-CM | POA: Diagnosis not present

## 2015-01-13 DIAGNOSIS — D509 Iron deficiency anemia, unspecified: Secondary | ICD-10-CM | POA: Diagnosis not present

## 2015-01-15 DIAGNOSIS — N2581 Secondary hyperparathyroidism of renal origin: Secondary | ICD-10-CM | POA: Diagnosis not present

## 2015-01-15 DIAGNOSIS — D509 Iron deficiency anemia, unspecified: Secondary | ICD-10-CM | POA: Diagnosis not present

## 2015-01-15 DIAGNOSIS — N186 End stage renal disease: Secondary | ICD-10-CM | POA: Diagnosis not present

## 2015-01-18 DIAGNOSIS — N186 End stage renal disease: Secondary | ICD-10-CM | POA: Diagnosis not present

## 2015-01-18 DIAGNOSIS — N2581 Secondary hyperparathyroidism of renal origin: Secondary | ICD-10-CM | POA: Diagnosis not present

## 2015-01-18 DIAGNOSIS — D509 Iron deficiency anemia, unspecified: Secondary | ICD-10-CM | POA: Diagnosis not present

## 2015-01-20 DIAGNOSIS — N186 End stage renal disease: Secondary | ICD-10-CM | POA: Diagnosis not present

## 2015-01-20 DIAGNOSIS — N2581 Secondary hyperparathyroidism of renal origin: Secondary | ICD-10-CM | POA: Diagnosis not present

## 2015-01-20 DIAGNOSIS — D509 Iron deficiency anemia, unspecified: Secondary | ICD-10-CM | POA: Diagnosis not present

## 2015-01-22 DIAGNOSIS — N186 End stage renal disease: Secondary | ICD-10-CM | POA: Diagnosis not present

## 2015-01-22 DIAGNOSIS — N2581 Secondary hyperparathyroidism of renal origin: Secondary | ICD-10-CM | POA: Diagnosis not present

## 2015-01-22 DIAGNOSIS — D509 Iron deficiency anemia, unspecified: Secondary | ICD-10-CM | POA: Diagnosis not present

## 2015-01-25 DIAGNOSIS — N186 End stage renal disease: Secondary | ICD-10-CM | POA: Diagnosis not present

## 2015-01-25 DIAGNOSIS — N2581 Secondary hyperparathyroidism of renal origin: Secondary | ICD-10-CM | POA: Diagnosis not present

## 2015-01-25 DIAGNOSIS — D509 Iron deficiency anemia, unspecified: Secondary | ICD-10-CM | POA: Diagnosis not present

## 2015-01-27 DIAGNOSIS — N2581 Secondary hyperparathyroidism of renal origin: Secondary | ICD-10-CM | POA: Diagnosis not present

## 2015-01-27 DIAGNOSIS — D509 Iron deficiency anemia, unspecified: Secondary | ICD-10-CM | POA: Diagnosis not present

## 2015-01-27 DIAGNOSIS — N186 End stage renal disease: Secondary | ICD-10-CM | POA: Diagnosis not present

## 2015-01-29 DIAGNOSIS — D509 Iron deficiency anemia, unspecified: Secondary | ICD-10-CM | POA: Diagnosis not present

## 2015-01-29 DIAGNOSIS — N2581 Secondary hyperparathyroidism of renal origin: Secondary | ICD-10-CM | POA: Diagnosis not present

## 2015-01-29 DIAGNOSIS — N186 End stage renal disease: Secondary | ICD-10-CM | POA: Diagnosis not present

## 2015-02-01 DIAGNOSIS — N2581 Secondary hyperparathyroidism of renal origin: Secondary | ICD-10-CM | POA: Diagnosis not present

## 2015-02-01 DIAGNOSIS — D509 Iron deficiency anemia, unspecified: Secondary | ICD-10-CM | POA: Diagnosis not present

## 2015-02-01 DIAGNOSIS — N186 End stage renal disease: Secondary | ICD-10-CM | POA: Diagnosis not present

## 2015-02-03 DIAGNOSIS — D509 Iron deficiency anemia, unspecified: Secondary | ICD-10-CM | POA: Diagnosis not present

## 2015-02-03 DIAGNOSIS — N186 End stage renal disease: Secondary | ICD-10-CM | POA: Diagnosis not present

## 2015-02-03 DIAGNOSIS — N2581 Secondary hyperparathyroidism of renal origin: Secondary | ICD-10-CM | POA: Diagnosis not present

## 2015-02-05 DIAGNOSIS — N186 End stage renal disease: Secondary | ICD-10-CM | POA: Diagnosis not present

## 2015-02-05 DIAGNOSIS — D509 Iron deficiency anemia, unspecified: Secondary | ICD-10-CM | POA: Diagnosis not present

## 2015-02-05 DIAGNOSIS — N2581 Secondary hyperparathyroidism of renal origin: Secondary | ICD-10-CM | POA: Diagnosis not present

## 2015-02-08 DIAGNOSIS — N2581 Secondary hyperparathyroidism of renal origin: Secondary | ICD-10-CM | POA: Diagnosis not present

## 2015-02-08 DIAGNOSIS — D509 Iron deficiency anemia, unspecified: Secondary | ICD-10-CM | POA: Diagnosis not present

## 2015-02-08 DIAGNOSIS — N186 End stage renal disease: Secondary | ICD-10-CM | POA: Diagnosis not present

## 2015-02-08 DIAGNOSIS — Z992 Dependence on renal dialysis: Secondary | ICD-10-CM | POA: Diagnosis not present

## 2015-02-08 DIAGNOSIS — I120 Hypertensive chronic kidney disease with stage 5 chronic kidney disease or end stage renal disease: Secondary | ICD-10-CM | POA: Diagnosis not present

## 2015-02-10 DIAGNOSIS — N186 End stage renal disease: Secondary | ICD-10-CM | POA: Diagnosis not present

## 2015-02-10 DIAGNOSIS — D509 Iron deficiency anemia, unspecified: Secondary | ICD-10-CM | POA: Diagnosis not present

## 2015-02-10 DIAGNOSIS — N2581 Secondary hyperparathyroidism of renal origin: Secondary | ICD-10-CM | POA: Diagnosis not present

## 2015-02-12 DIAGNOSIS — D509 Iron deficiency anemia, unspecified: Secondary | ICD-10-CM | POA: Diagnosis not present

## 2015-02-12 DIAGNOSIS — N186 End stage renal disease: Secondary | ICD-10-CM | POA: Diagnosis not present

## 2015-02-12 DIAGNOSIS — N2581 Secondary hyperparathyroidism of renal origin: Secondary | ICD-10-CM | POA: Diagnosis not present

## 2015-02-15 DIAGNOSIS — D509 Iron deficiency anemia, unspecified: Secondary | ICD-10-CM | POA: Diagnosis not present

## 2015-02-15 DIAGNOSIS — N186 End stage renal disease: Secondary | ICD-10-CM | POA: Diagnosis not present

## 2015-02-15 DIAGNOSIS — N2581 Secondary hyperparathyroidism of renal origin: Secondary | ICD-10-CM | POA: Diagnosis not present

## 2015-02-17 DIAGNOSIS — D509 Iron deficiency anemia, unspecified: Secondary | ICD-10-CM | POA: Diagnosis not present

## 2015-02-17 DIAGNOSIS — N2581 Secondary hyperparathyroidism of renal origin: Secondary | ICD-10-CM | POA: Diagnosis not present

## 2015-02-17 DIAGNOSIS — N186 End stage renal disease: Secondary | ICD-10-CM | POA: Diagnosis not present

## 2015-02-19 DIAGNOSIS — D509 Iron deficiency anemia, unspecified: Secondary | ICD-10-CM | POA: Diagnosis not present

## 2015-02-19 DIAGNOSIS — N2581 Secondary hyperparathyroidism of renal origin: Secondary | ICD-10-CM | POA: Diagnosis not present

## 2015-02-19 DIAGNOSIS — N186 End stage renal disease: Secondary | ICD-10-CM | POA: Diagnosis not present

## 2015-02-22 DIAGNOSIS — N186 End stage renal disease: Secondary | ICD-10-CM | POA: Diagnosis not present

## 2015-02-22 DIAGNOSIS — N2581 Secondary hyperparathyroidism of renal origin: Secondary | ICD-10-CM | POA: Diagnosis not present

## 2015-02-22 DIAGNOSIS — D509 Iron deficiency anemia, unspecified: Secondary | ICD-10-CM | POA: Diagnosis not present

## 2015-02-24 DIAGNOSIS — N2581 Secondary hyperparathyroidism of renal origin: Secondary | ICD-10-CM | POA: Diagnosis not present

## 2015-02-24 DIAGNOSIS — D509 Iron deficiency anemia, unspecified: Secondary | ICD-10-CM | POA: Diagnosis not present

## 2015-02-24 DIAGNOSIS — N186 End stage renal disease: Secondary | ICD-10-CM | POA: Diagnosis not present

## 2015-02-26 DIAGNOSIS — N2581 Secondary hyperparathyroidism of renal origin: Secondary | ICD-10-CM | POA: Diagnosis not present

## 2015-02-26 DIAGNOSIS — N186 End stage renal disease: Secondary | ICD-10-CM | POA: Diagnosis not present

## 2015-02-26 DIAGNOSIS — D509 Iron deficiency anemia, unspecified: Secondary | ICD-10-CM | POA: Diagnosis not present

## 2015-03-01 DIAGNOSIS — N2581 Secondary hyperparathyroidism of renal origin: Secondary | ICD-10-CM | POA: Diagnosis not present

## 2015-03-01 DIAGNOSIS — N186 End stage renal disease: Secondary | ICD-10-CM | POA: Diagnosis not present

## 2015-03-01 DIAGNOSIS — D509 Iron deficiency anemia, unspecified: Secondary | ICD-10-CM | POA: Diagnosis not present

## 2015-03-03 DIAGNOSIS — N186 End stage renal disease: Secondary | ICD-10-CM | POA: Diagnosis not present

## 2015-03-03 DIAGNOSIS — N2581 Secondary hyperparathyroidism of renal origin: Secondary | ICD-10-CM | POA: Diagnosis not present

## 2015-03-03 DIAGNOSIS — D509 Iron deficiency anemia, unspecified: Secondary | ICD-10-CM | POA: Diagnosis not present

## 2015-03-05 DIAGNOSIS — N2581 Secondary hyperparathyroidism of renal origin: Secondary | ICD-10-CM | POA: Diagnosis not present

## 2015-03-05 DIAGNOSIS — D509 Iron deficiency anemia, unspecified: Secondary | ICD-10-CM | POA: Diagnosis not present

## 2015-03-05 DIAGNOSIS — N186 End stage renal disease: Secondary | ICD-10-CM | POA: Diagnosis not present

## 2015-03-08 DIAGNOSIS — N186 End stage renal disease: Secondary | ICD-10-CM | POA: Diagnosis not present

## 2015-03-08 DIAGNOSIS — N2581 Secondary hyperparathyroidism of renal origin: Secondary | ICD-10-CM | POA: Diagnosis not present

## 2015-03-08 DIAGNOSIS — I120 Hypertensive chronic kidney disease with stage 5 chronic kidney disease or end stage renal disease: Secondary | ICD-10-CM | POA: Diagnosis not present

## 2015-03-08 DIAGNOSIS — Z992 Dependence on renal dialysis: Secondary | ICD-10-CM | POA: Diagnosis not present

## 2015-03-08 DIAGNOSIS — D509 Iron deficiency anemia, unspecified: Secondary | ICD-10-CM | POA: Diagnosis not present

## 2015-03-10 DIAGNOSIS — N2581 Secondary hyperparathyroidism of renal origin: Secondary | ICD-10-CM | POA: Diagnosis not present

## 2015-03-10 DIAGNOSIS — D509 Iron deficiency anemia, unspecified: Secondary | ICD-10-CM | POA: Diagnosis not present

## 2015-03-10 DIAGNOSIS — N186 End stage renal disease: Secondary | ICD-10-CM | POA: Diagnosis not present

## 2015-03-12 DIAGNOSIS — N2581 Secondary hyperparathyroidism of renal origin: Secondary | ICD-10-CM | POA: Diagnosis not present

## 2015-03-12 DIAGNOSIS — D509 Iron deficiency anemia, unspecified: Secondary | ICD-10-CM | POA: Diagnosis not present

## 2015-03-12 DIAGNOSIS — N186 End stage renal disease: Secondary | ICD-10-CM | POA: Diagnosis not present

## 2015-03-15 DIAGNOSIS — D509 Iron deficiency anemia, unspecified: Secondary | ICD-10-CM | POA: Diagnosis not present

## 2015-03-15 DIAGNOSIS — N186 End stage renal disease: Secondary | ICD-10-CM | POA: Diagnosis not present

## 2015-03-15 DIAGNOSIS — N2581 Secondary hyperparathyroidism of renal origin: Secondary | ICD-10-CM | POA: Diagnosis not present

## 2015-03-17 DIAGNOSIS — D509 Iron deficiency anemia, unspecified: Secondary | ICD-10-CM | POA: Diagnosis not present

## 2015-03-17 DIAGNOSIS — N2581 Secondary hyperparathyroidism of renal origin: Secondary | ICD-10-CM | POA: Diagnosis not present

## 2015-03-17 DIAGNOSIS — N186 End stage renal disease: Secondary | ICD-10-CM | POA: Diagnosis not present

## 2015-03-19 DIAGNOSIS — N186 End stage renal disease: Secondary | ICD-10-CM | POA: Diagnosis not present

## 2015-03-19 DIAGNOSIS — N2581 Secondary hyperparathyroidism of renal origin: Secondary | ICD-10-CM | POA: Diagnosis not present

## 2015-03-19 DIAGNOSIS — D509 Iron deficiency anemia, unspecified: Secondary | ICD-10-CM | POA: Diagnosis not present

## 2015-03-22 DIAGNOSIS — N2581 Secondary hyperparathyroidism of renal origin: Secondary | ICD-10-CM | POA: Diagnosis not present

## 2015-03-22 DIAGNOSIS — N186 End stage renal disease: Secondary | ICD-10-CM | POA: Diagnosis not present

## 2015-03-22 DIAGNOSIS — D509 Iron deficiency anemia, unspecified: Secondary | ICD-10-CM | POA: Diagnosis not present

## 2015-03-24 DIAGNOSIS — D509 Iron deficiency anemia, unspecified: Secondary | ICD-10-CM | POA: Diagnosis not present

## 2015-03-24 DIAGNOSIS — N186 End stage renal disease: Secondary | ICD-10-CM | POA: Diagnosis not present

## 2015-03-24 DIAGNOSIS — N2581 Secondary hyperparathyroidism of renal origin: Secondary | ICD-10-CM | POA: Diagnosis not present

## 2015-03-26 DIAGNOSIS — D509 Iron deficiency anemia, unspecified: Secondary | ICD-10-CM | POA: Diagnosis not present

## 2015-03-26 DIAGNOSIS — N186 End stage renal disease: Secondary | ICD-10-CM | POA: Diagnosis not present

## 2015-03-26 DIAGNOSIS — N2581 Secondary hyperparathyroidism of renal origin: Secondary | ICD-10-CM | POA: Diagnosis not present

## 2015-03-29 DIAGNOSIS — N186 End stage renal disease: Secondary | ICD-10-CM | POA: Diagnosis not present

## 2015-03-29 DIAGNOSIS — D509 Iron deficiency anemia, unspecified: Secondary | ICD-10-CM | POA: Diagnosis not present

## 2015-03-29 DIAGNOSIS — N2581 Secondary hyperparathyroidism of renal origin: Secondary | ICD-10-CM | POA: Diagnosis not present

## 2015-03-31 DIAGNOSIS — N2581 Secondary hyperparathyroidism of renal origin: Secondary | ICD-10-CM | POA: Diagnosis not present

## 2015-03-31 DIAGNOSIS — N186 End stage renal disease: Secondary | ICD-10-CM | POA: Diagnosis not present

## 2015-03-31 DIAGNOSIS — D509 Iron deficiency anemia, unspecified: Secondary | ICD-10-CM | POA: Diagnosis not present

## 2015-04-02 DIAGNOSIS — D509 Iron deficiency anemia, unspecified: Secondary | ICD-10-CM | POA: Diagnosis not present

## 2015-04-02 DIAGNOSIS — N186 End stage renal disease: Secondary | ICD-10-CM | POA: Diagnosis not present

## 2015-04-02 DIAGNOSIS — N2581 Secondary hyperparathyroidism of renal origin: Secondary | ICD-10-CM | POA: Diagnosis not present

## 2015-04-05 DIAGNOSIS — D509 Iron deficiency anemia, unspecified: Secondary | ICD-10-CM | POA: Diagnosis not present

## 2015-04-05 DIAGNOSIS — N186 End stage renal disease: Secondary | ICD-10-CM | POA: Diagnosis not present

## 2015-04-05 DIAGNOSIS — N2581 Secondary hyperparathyroidism of renal origin: Secondary | ICD-10-CM | POA: Diagnosis not present

## 2015-04-07 DIAGNOSIS — N186 End stage renal disease: Secondary | ICD-10-CM | POA: Diagnosis not present

## 2015-04-07 DIAGNOSIS — N2581 Secondary hyperparathyroidism of renal origin: Secondary | ICD-10-CM | POA: Diagnosis not present

## 2015-04-07 DIAGNOSIS — D509 Iron deficiency anemia, unspecified: Secondary | ICD-10-CM | POA: Diagnosis not present

## 2015-04-08 DIAGNOSIS — N186 End stage renal disease: Secondary | ICD-10-CM | POA: Diagnosis not present

## 2015-04-08 DIAGNOSIS — Z992 Dependence on renal dialysis: Secondary | ICD-10-CM | POA: Diagnosis not present

## 2015-04-08 DIAGNOSIS — I120 Hypertensive chronic kidney disease with stage 5 chronic kidney disease or end stage renal disease: Secondary | ICD-10-CM | POA: Diagnosis not present

## 2015-04-09 DIAGNOSIS — N2581 Secondary hyperparathyroidism of renal origin: Secondary | ICD-10-CM | POA: Diagnosis not present

## 2015-04-09 DIAGNOSIS — N186 End stage renal disease: Secondary | ICD-10-CM | POA: Diagnosis not present

## 2015-04-09 DIAGNOSIS — D509 Iron deficiency anemia, unspecified: Secondary | ICD-10-CM | POA: Diagnosis not present

## 2015-04-12 DIAGNOSIS — N2581 Secondary hyperparathyroidism of renal origin: Secondary | ICD-10-CM | POA: Diagnosis not present

## 2015-04-12 DIAGNOSIS — D509 Iron deficiency anemia, unspecified: Secondary | ICD-10-CM | POA: Diagnosis not present

## 2015-04-12 DIAGNOSIS — N186 End stage renal disease: Secondary | ICD-10-CM | POA: Diagnosis not present

## 2015-04-14 DIAGNOSIS — N2581 Secondary hyperparathyroidism of renal origin: Secondary | ICD-10-CM | POA: Diagnosis not present

## 2015-04-14 DIAGNOSIS — N186 End stage renal disease: Secondary | ICD-10-CM | POA: Diagnosis not present

## 2015-04-14 DIAGNOSIS — D509 Iron deficiency anemia, unspecified: Secondary | ICD-10-CM | POA: Diagnosis not present

## 2015-04-16 DIAGNOSIS — N186 End stage renal disease: Secondary | ICD-10-CM | POA: Diagnosis not present

## 2015-04-16 DIAGNOSIS — N2581 Secondary hyperparathyroidism of renal origin: Secondary | ICD-10-CM | POA: Diagnosis not present

## 2015-04-16 DIAGNOSIS — D509 Iron deficiency anemia, unspecified: Secondary | ICD-10-CM | POA: Diagnosis not present

## 2015-04-19 DIAGNOSIS — N2581 Secondary hyperparathyroidism of renal origin: Secondary | ICD-10-CM | POA: Diagnosis not present

## 2015-04-19 DIAGNOSIS — D509 Iron deficiency anemia, unspecified: Secondary | ICD-10-CM | POA: Diagnosis not present

## 2015-04-19 DIAGNOSIS — N186 End stage renal disease: Secondary | ICD-10-CM | POA: Diagnosis not present

## 2015-04-21 DIAGNOSIS — N186 End stage renal disease: Secondary | ICD-10-CM | POA: Diagnosis not present

## 2015-04-21 DIAGNOSIS — D509 Iron deficiency anemia, unspecified: Secondary | ICD-10-CM | POA: Diagnosis not present

## 2015-04-21 DIAGNOSIS — N2581 Secondary hyperparathyroidism of renal origin: Secondary | ICD-10-CM | POA: Diagnosis not present

## 2015-04-23 DIAGNOSIS — N186 End stage renal disease: Secondary | ICD-10-CM | POA: Diagnosis not present

## 2015-04-23 DIAGNOSIS — N2581 Secondary hyperparathyroidism of renal origin: Secondary | ICD-10-CM | POA: Diagnosis not present

## 2015-04-23 DIAGNOSIS — D509 Iron deficiency anemia, unspecified: Secondary | ICD-10-CM | POA: Diagnosis not present

## 2015-04-26 DIAGNOSIS — N186 End stage renal disease: Secondary | ICD-10-CM | POA: Diagnosis not present

## 2015-04-26 DIAGNOSIS — D509 Iron deficiency anemia, unspecified: Secondary | ICD-10-CM | POA: Diagnosis not present

## 2015-04-26 DIAGNOSIS — N2581 Secondary hyperparathyroidism of renal origin: Secondary | ICD-10-CM | POA: Diagnosis not present

## 2015-04-28 DIAGNOSIS — N186 End stage renal disease: Secondary | ICD-10-CM | POA: Diagnosis not present

## 2015-04-28 DIAGNOSIS — N2581 Secondary hyperparathyroidism of renal origin: Secondary | ICD-10-CM | POA: Diagnosis not present

## 2015-04-28 DIAGNOSIS — D509 Iron deficiency anemia, unspecified: Secondary | ICD-10-CM | POA: Diagnosis not present

## 2015-04-30 DIAGNOSIS — N2581 Secondary hyperparathyroidism of renal origin: Secondary | ICD-10-CM | POA: Diagnosis not present

## 2015-04-30 DIAGNOSIS — N186 End stage renal disease: Secondary | ICD-10-CM | POA: Diagnosis not present

## 2015-04-30 DIAGNOSIS — D509 Iron deficiency anemia, unspecified: Secondary | ICD-10-CM | POA: Diagnosis not present

## 2015-05-03 DIAGNOSIS — N2581 Secondary hyperparathyroidism of renal origin: Secondary | ICD-10-CM | POA: Diagnosis not present

## 2015-05-03 DIAGNOSIS — N186 End stage renal disease: Secondary | ICD-10-CM | POA: Diagnosis not present

## 2015-05-03 DIAGNOSIS — D509 Iron deficiency anemia, unspecified: Secondary | ICD-10-CM | POA: Diagnosis not present

## 2015-05-05 DIAGNOSIS — Z125 Encounter for screening for malignant neoplasm of prostate: Secondary | ICD-10-CM | POA: Diagnosis not present

## 2015-05-05 DIAGNOSIS — N411 Chronic prostatitis: Secondary | ICD-10-CM | POA: Diagnosis not present

## 2015-05-05 DIAGNOSIS — D509 Iron deficiency anemia, unspecified: Secondary | ICD-10-CM | POA: Diagnosis not present

## 2015-05-05 DIAGNOSIS — N186 End stage renal disease: Secondary | ICD-10-CM | POA: Diagnosis not present

## 2015-05-05 DIAGNOSIS — N2581 Secondary hyperparathyroidism of renal origin: Secondary | ICD-10-CM | POA: Diagnosis not present

## 2015-05-07 DIAGNOSIS — D509 Iron deficiency anemia, unspecified: Secondary | ICD-10-CM | POA: Diagnosis not present

## 2015-05-07 DIAGNOSIS — N2581 Secondary hyperparathyroidism of renal origin: Secondary | ICD-10-CM | POA: Diagnosis not present

## 2015-05-07 DIAGNOSIS — N186 End stage renal disease: Secondary | ICD-10-CM | POA: Diagnosis not present

## 2015-05-08 DIAGNOSIS — Z992 Dependence on renal dialysis: Secondary | ICD-10-CM | POA: Diagnosis not present

## 2015-05-08 DIAGNOSIS — I120 Hypertensive chronic kidney disease with stage 5 chronic kidney disease or end stage renal disease: Secondary | ICD-10-CM | POA: Diagnosis not present

## 2015-05-08 DIAGNOSIS — N186 End stage renal disease: Secondary | ICD-10-CM | POA: Diagnosis not present

## 2015-05-10 DIAGNOSIS — N2581 Secondary hyperparathyroidism of renal origin: Secondary | ICD-10-CM | POA: Diagnosis not present

## 2015-05-10 DIAGNOSIS — N186 End stage renal disease: Secondary | ICD-10-CM | POA: Diagnosis not present

## 2015-05-12 DIAGNOSIS — N2581 Secondary hyperparathyroidism of renal origin: Secondary | ICD-10-CM | POA: Diagnosis not present

## 2015-05-12 DIAGNOSIS — N186 End stage renal disease: Secondary | ICD-10-CM | POA: Diagnosis not present

## 2015-05-14 DIAGNOSIS — N186 End stage renal disease: Secondary | ICD-10-CM | POA: Diagnosis not present

## 2015-05-14 DIAGNOSIS — N2581 Secondary hyperparathyroidism of renal origin: Secondary | ICD-10-CM | POA: Diagnosis not present

## 2015-05-17 DIAGNOSIS — N186 End stage renal disease: Secondary | ICD-10-CM | POA: Diagnosis not present

## 2015-05-17 DIAGNOSIS — N2581 Secondary hyperparathyroidism of renal origin: Secondary | ICD-10-CM | POA: Diagnosis not present

## 2015-05-19 DIAGNOSIS — N2581 Secondary hyperparathyroidism of renal origin: Secondary | ICD-10-CM | POA: Diagnosis not present

## 2015-05-19 DIAGNOSIS — N186 End stage renal disease: Secondary | ICD-10-CM | POA: Diagnosis not present

## 2015-05-21 DIAGNOSIS — N2581 Secondary hyperparathyroidism of renal origin: Secondary | ICD-10-CM | POA: Diagnosis not present

## 2015-05-21 DIAGNOSIS — N186 End stage renal disease: Secondary | ICD-10-CM | POA: Diagnosis not present

## 2015-05-24 DIAGNOSIS — N2581 Secondary hyperparathyroidism of renal origin: Secondary | ICD-10-CM | POA: Diagnosis not present

## 2015-05-24 DIAGNOSIS — N186 End stage renal disease: Secondary | ICD-10-CM | POA: Diagnosis not present

## 2015-05-26 DIAGNOSIS — N186 End stage renal disease: Secondary | ICD-10-CM | POA: Diagnosis not present

## 2015-05-26 DIAGNOSIS — N2581 Secondary hyperparathyroidism of renal origin: Secondary | ICD-10-CM | POA: Diagnosis not present

## 2015-05-28 DIAGNOSIS — N186 End stage renal disease: Secondary | ICD-10-CM | POA: Diagnosis not present

## 2015-05-28 DIAGNOSIS — N2581 Secondary hyperparathyroidism of renal origin: Secondary | ICD-10-CM | POA: Diagnosis not present

## 2015-05-31 DIAGNOSIS — N2581 Secondary hyperparathyroidism of renal origin: Secondary | ICD-10-CM | POA: Diagnosis not present

## 2015-05-31 DIAGNOSIS — N186 End stage renal disease: Secondary | ICD-10-CM | POA: Diagnosis not present

## 2015-06-02 DIAGNOSIS — N2581 Secondary hyperparathyroidism of renal origin: Secondary | ICD-10-CM | POA: Diagnosis not present

## 2015-06-02 DIAGNOSIS — N186 End stage renal disease: Secondary | ICD-10-CM | POA: Diagnosis not present

## 2015-06-04 DIAGNOSIS — N186 End stage renal disease: Secondary | ICD-10-CM | POA: Diagnosis not present

## 2015-06-04 DIAGNOSIS — N2581 Secondary hyperparathyroidism of renal origin: Secondary | ICD-10-CM | POA: Diagnosis not present

## 2015-06-07 DIAGNOSIS — N2581 Secondary hyperparathyroidism of renal origin: Secondary | ICD-10-CM | POA: Diagnosis not present

## 2015-06-07 DIAGNOSIS — N186 End stage renal disease: Secondary | ICD-10-CM | POA: Diagnosis not present

## 2015-06-08 DIAGNOSIS — N186 End stage renal disease: Secondary | ICD-10-CM | POA: Diagnosis not present

## 2015-06-08 DIAGNOSIS — I120 Hypertensive chronic kidney disease with stage 5 chronic kidney disease or end stage renal disease: Secondary | ICD-10-CM | POA: Diagnosis not present

## 2015-06-08 DIAGNOSIS — Z992 Dependence on renal dialysis: Secondary | ICD-10-CM | POA: Diagnosis not present

## 2015-06-09 DIAGNOSIS — N186 End stage renal disease: Secondary | ICD-10-CM | POA: Diagnosis not present

## 2015-06-09 DIAGNOSIS — N2581 Secondary hyperparathyroidism of renal origin: Secondary | ICD-10-CM | POA: Diagnosis not present

## 2015-06-11 DIAGNOSIS — N2581 Secondary hyperparathyroidism of renal origin: Secondary | ICD-10-CM | POA: Diagnosis not present

## 2015-06-11 DIAGNOSIS — N186 End stage renal disease: Secondary | ICD-10-CM | POA: Diagnosis not present

## 2015-06-14 DIAGNOSIS — N186 End stage renal disease: Secondary | ICD-10-CM | POA: Diagnosis not present

## 2015-06-14 DIAGNOSIS — N2581 Secondary hyperparathyroidism of renal origin: Secondary | ICD-10-CM | POA: Diagnosis not present

## 2015-06-15 ENCOUNTER — Other Ambulatory Visit: Payer: Self-pay | Admitting: Nephrology

## 2015-06-15 ENCOUNTER — Ambulatory Visit
Admission: RE | Admit: 2015-06-15 | Discharge: 2015-06-15 | Disposition: A | Discharge status: Home / Self Care | Payer: Medicare Other | Source: Ambulatory Visit | Attending: Nephrology | Admitting: Nephrology

## 2015-06-15 DIAGNOSIS — Z949 Transplanted organ and tissue status, unspecified: Secondary | ICD-10-CM

## 2015-06-15 DIAGNOSIS — Z992 Dependence on renal dialysis: Secondary | ICD-10-CM | POA: Diagnosis not present

## 2015-06-16 DIAGNOSIS — N186 End stage renal disease: Secondary | ICD-10-CM | POA: Diagnosis not present

## 2015-06-16 DIAGNOSIS — N2581 Secondary hyperparathyroidism of renal origin: Secondary | ICD-10-CM | POA: Diagnosis not present

## 2015-06-18 DIAGNOSIS — N186 End stage renal disease: Secondary | ICD-10-CM | POA: Diagnosis not present

## 2015-06-18 DIAGNOSIS — N2581 Secondary hyperparathyroidism of renal origin: Secondary | ICD-10-CM | POA: Diagnosis not present

## 2015-06-21 DIAGNOSIS — N2581 Secondary hyperparathyroidism of renal origin: Secondary | ICD-10-CM | POA: Diagnosis not present

## 2015-06-21 DIAGNOSIS — N186 End stage renal disease: Secondary | ICD-10-CM | POA: Diagnosis not present

## 2015-06-23 DIAGNOSIS — N2581 Secondary hyperparathyroidism of renal origin: Secondary | ICD-10-CM | POA: Diagnosis not present

## 2015-06-23 DIAGNOSIS — N186 End stage renal disease: Secondary | ICD-10-CM | POA: Diagnosis not present

## 2015-06-25 DIAGNOSIS — N186 End stage renal disease: Secondary | ICD-10-CM | POA: Diagnosis not present

## 2015-06-25 DIAGNOSIS — N2581 Secondary hyperparathyroidism of renal origin: Secondary | ICD-10-CM | POA: Diagnosis not present

## 2015-06-28 DIAGNOSIS — N186 End stage renal disease: Secondary | ICD-10-CM | POA: Diagnosis not present

## 2015-06-28 DIAGNOSIS — N2581 Secondary hyperparathyroidism of renal origin: Secondary | ICD-10-CM | POA: Diagnosis not present

## 2015-06-30 DIAGNOSIS — N186 End stage renal disease: Secondary | ICD-10-CM | POA: Diagnosis not present

## 2015-06-30 DIAGNOSIS — N2581 Secondary hyperparathyroidism of renal origin: Secondary | ICD-10-CM | POA: Diagnosis not present

## 2015-07-02 DIAGNOSIS — N186 End stage renal disease: Secondary | ICD-10-CM | POA: Diagnosis not present

## 2015-07-02 DIAGNOSIS — N2581 Secondary hyperparathyroidism of renal origin: Secondary | ICD-10-CM | POA: Diagnosis not present

## 2015-07-05 DIAGNOSIS — N186 End stage renal disease: Secondary | ICD-10-CM | POA: Diagnosis not present

## 2015-07-05 DIAGNOSIS — N2581 Secondary hyperparathyroidism of renal origin: Secondary | ICD-10-CM | POA: Diagnosis not present

## 2015-07-07 DIAGNOSIS — N2581 Secondary hyperparathyroidism of renal origin: Secondary | ICD-10-CM | POA: Diagnosis not present

## 2015-07-07 DIAGNOSIS — N186 End stage renal disease: Secondary | ICD-10-CM | POA: Diagnosis not present

## 2015-07-08 DIAGNOSIS — N186 End stage renal disease: Secondary | ICD-10-CM | POA: Diagnosis not present

## 2015-07-08 DIAGNOSIS — Z992 Dependence on renal dialysis: Secondary | ICD-10-CM | POA: Diagnosis not present

## 2015-07-08 DIAGNOSIS — I120 Hypertensive chronic kidney disease with stage 5 chronic kidney disease or end stage renal disease: Secondary | ICD-10-CM | POA: Diagnosis not present

## 2015-07-09 DIAGNOSIS — N186 End stage renal disease: Secondary | ICD-10-CM | POA: Diagnosis not present

## 2015-07-09 DIAGNOSIS — N2581 Secondary hyperparathyroidism of renal origin: Secondary | ICD-10-CM | POA: Diagnosis not present

## 2015-07-12 DIAGNOSIS — N186 End stage renal disease: Secondary | ICD-10-CM | POA: Diagnosis not present

## 2015-07-12 DIAGNOSIS — N2581 Secondary hyperparathyroidism of renal origin: Secondary | ICD-10-CM | POA: Diagnosis not present

## 2015-07-14 DIAGNOSIS — N2581 Secondary hyperparathyroidism of renal origin: Secondary | ICD-10-CM | POA: Diagnosis not present

## 2015-07-14 DIAGNOSIS — N186 End stage renal disease: Secondary | ICD-10-CM | POA: Diagnosis not present

## 2015-07-16 DIAGNOSIS — N2581 Secondary hyperparathyroidism of renal origin: Secondary | ICD-10-CM | POA: Diagnosis not present

## 2015-07-16 DIAGNOSIS — N186 End stage renal disease: Secondary | ICD-10-CM | POA: Diagnosis not present

## 2015-07-19 DIAGNOSIS — N2581 Secondary hyperparathyroidism of renal origin: Secondary | ICD-10-CM | POA: Diagnosis not present

## 2015-07-19 DIAGNOSIS — N186 End stage renal disease: Secondary | ICD-10-CM | POA: Diagnosis not present

## 2015-07-21 DIAGNOSIS — N2581 Secondary hyperparathyroidism of renal origin: Secondary | ICD-10-CM | POA: Diagnosis not present

## 2015-07-21 DIAGNOSIS — N186 End stage renal disease: Secondary | ICD-10-CM | POA: Diagnosis not present

## 2015-07-23 DIAGNOSIS — N2581 Secondary hyperparathyroidism of renal origin: Secondary | ICD-10-CM | POA: Diagnosis not present

## 2015-07-23 DIAGNOSIS — N186 End stage renal disease: Secondary | ICD-10-CM | POA: Diagnosis not present

## 2015-07-26 DIAGNOSIS — N186 End stage renal disease: Secondary | ICD-10-CM | POA: Diagnosis not present

## 2015-07-26 DIAGNOSIS — N2581 Secondary hyperparathyroidism of renal origin: Secondary | ICD-10-CM | POA: Diagnosis not present

## 2015-07-28 DIAGNOSIS — N2581 Secondary hyperparathyroidism of renal origin: Secondary | ICD-10-CM | POA: Diagnosis not present

## 2015-07-28 DIAGNOSIS — N186 End stage renal disease: Secondary | ICD-10-CM | POA: Diagnosis not present

## 2015-07-30 DIAGNOSIS — N186 End stage renal disease: Secondary | ICD-10-CM | POA: Diagnosis not present

## 2015-07-30 DIAGNOSIS — N2581 Secondary hyperparathyroidism of renal origin: Secondary | ICD-10-CM | POA: Diagnosis not present

## 2015-08-02 DIAGNOSIS — N186 End stage renal disease: Secondary | ICD-10-CM | POA: Diagnosis not present

## 2015-08-02 DIAGNOSIS — N2581 Secondary hyperparathyroidism of renal origin: Secondary | ICD-10-CM | POA: Diagnosis not present

## 2015-08-04 DIAGNOSIS — N186 End stage renal disease: Secondary | ICD-10-CM | POA: Diagnosis not present

## 2015-08-04 DIAGNOSIS — N2581 Secondary hyperparathyroidism of renal origin: Secondary | ICD-10-CM | POA: Diagnosis not present

## 2015-08-06 DIAGNOSIS — N186 End stage renal disease: Secondary | ICD-10-CM | POA: Diagnosis not present

## 2015-08-06 DIAGNOSIS — N2581 Secondary hyperparathyroidism of renal origin: Secondary | ICD-10-CM | POA: Diagnosis not present

## 2015-08-08 DIAGNOSIS — I120 Hypertensive chronic kidney disease with stage 5 chronic kidney disease or end stage renal disease: Secondary | ICD-10-CM | POA: Diagnosis not present

## 2015-08-08 DIAGNOSIS — N186 End stage renal disease: Secondary | ICD-10-CM | POA: Diagnosis not present

## 2015-08-08 DIAGNOSIS — Z992 Dependence on renal dialysis: Secondary | ICD-10-CM | POA: Diagnosis not present

## 2015-08-09 DIAGNOSIS — N2581 Secondary hyperparathyroidism of renal origin: Secondary | ICD-10-CM | POA: Diagnosis not present

## 2015-08-09 DIAGNOSIS — D509 Iron deficiency anemia, unspecified: Secondary | ICD-10-CM | POA: Diagnosis not present

## 2015-08-09 DIAGNOSIS — N186 End stage renal disease: Secondary | ICD-10-CM | POA: Diagnosis not present

## 2015-08-10 DIAGNOSIS — Z992 Dependence on renal dialysis: Secondary | ICD-10-CM | POA: Diagnosis not present

## 2015-08-10 DIAGNOSIS — N186 End stage renal disease: Secondary | ICD-10-CM | POA: Diagnosis not present

## 2015-08-10 DIAGNOSIS — I871 Compression of vein: Secondary | ICD-10-CM | POA: Diagnosis not present

## 2015-08-10 DIAGNOSIS — T82858D Stenosis of vascular prosthetic devices, implants and grafts, subsequent encounter: Secondary | ICD-10-CM | POA: Diagnosis not present

## 2015-08-11 DIAGNOSIS — D509 Iron deficiency anemia, unspecified: Secondary | ICD-10-CM | POA: Diagnosis not present

## 2015-08-11 DIAGNOSIS — N2581 Secondary hyperparathyroidism of renal origin: Secondary | ICD-10-CM | POA: Diagnosis not present

## 2015-08-11 DIAGNOSIS — N186 End stage renal disease: Secondary | ICD-10-CM | POA: Diagnosis not present

## 2015-08-13 DIAGNOSIS — N2581 Secondary hyperparathyroidism of renal origin: Secondary | ICD-10-CM | POA: Diagnosis not present

## 2015-08-13 DIAGNOSIS — D509 Iron deficiency anemia, unspecified: Secondary | ICD-10-CM | POA: Diagnosis not present

## 2015-08-13 DIAGNOSIS — N186 End stage renal disease: Secondary | ICD-10-CM | POA: Diagnosis not present

## 2015-08-16 DIAGNOSIS — D509 Iron deficiency anemia, unspecified: Secondary | ICD-10-CM | POA: Diagnosis not present

## 2015-08-16 DIAGNOSIS — N2581 Secondary hyperparathyroidism of renal origin: Secondary | ICD-10-CM | POA: Diagnosis not present

## 2015-08-16 DIAGNOSIS — N186 End stage renal disease: Secondary | ICD-10-CM | POA: Diagnosis not present

## 2015-08-18 DIAGNOSIS — D509 Iron deficiency anemia, unspecified: Secondary | ICD-10-CM | POA: Diagnosis not present

## 2015-08-18 DIAGNOSIS — N2581 Secondary hyperparathyroidism of renal origin: Secondary | ICD-10-CM | POA: Diagnosis not present

## 2015-08-18 DIAGNOSIS — N186 End stage renal disease: Secondary | ICD-10-CM | POA: Diagnosis not present

## 2015-08-20 DIAGNOSIS — N2581 Secondary hyperparathyroidism of renal origin: Secondary | ICD-10-CM | POA: Diagnosis not present

## 2015-08-20 DIAGNOSIS — N186 End stage renal disease: Secondary | ICD-10-CM | POA: Diagnosis not present

## 2015-08-20 DIAGNOSIS — D509 Iron deficiency anemia, unspecified: Secondary | ICD-10-CM | POA: Diagnosis not present

## 2015-08-23 DIAGNOSIS — N2581 Secondary hyperparathyroidism of renal origin: Secondary | ICD-10-CM | POA: Diagnosis not present

## 2015-08-23 DIAGNOSIS — N186 End stage renal disease: Secondary | ICD-10-CM | POA: Diagnosis not present

## 2015-08-23 DIAGNOSIS — D509 Iron deficiency anemia, unspecified: Secondary | ICD-10-CM | POA: Diagnosis not present

## 2015-08-25 DIAGNOSIS — N186 End stage renal disease: Secondary | ICD-10-CM | POA: Diagnosis not present

## 2015-08-25 DIAGNOSIS — D509 Iron deficiency anemia, unspecified: Secondary | ICD-10-CM | POA: Diagnosis not present

## 2015-08-25 DIAGNOSIS — N2581 Secondary hyperparathyroidism of renal origin: Secondary | ICD-10-CM | POA: Diagnosis not present

## 2015-08-27 DIAGNOSIS — N186 End stage renal disease: Secondary | ICD-10-CM | POA: Diagnosis not present

## 2015-08-27 DIAGNOSIS — D509 Iron deficiency anemia, unspecified: Secondary | ICD-10-CM | POA: Diagnosis not present

## 2015-08-27 DIAGNOSIS — N2581 Secondary hyperparathyroidism of renal origin: Secondary | ICD-10-CM | POA: Diagnosis not present

## 2015-08-30 DIAGNOSIS — N2581 Secondary hyperparathyroidism of renal origin: Secondary | ICD-10-CM | POA: Diagnosis not present

## 2015-08-30 DIAGNOSIS — D509 Iron deficiency anemia, unspecified: Secondary | ICD-10-CM | POA: Diagnosis not present

## 2015-08-30 DIAGNOSIS — N186 End stage renal disease: Secondary | ICD-10-CM | POA: Diagnosis not present

## 2015-09-01 DIAGNOSIS — N186 End stage renal disease: Secondary | ICD-10-CM | POA: Diagnosis not present

## 2015-09-01 DIAGNOSIS — D509 Iron deficiency anemia, unspecified: Secondary | ICD-10-CM | POA: Diagnosis not present

## 2015-09-01 DIAGNOSIS — N2581 Secondary hyperparathyroidism of renal origin: Secondary | ICD-10-CM | POA: Diagnosis not present

## 2015-09-03 DIAGNOSIS — N186 End stage renal disease: Secondary | ICD-10-CM | POA: Diagnosis not present

## 2015-09-03 DIAGNOSIS — D509 Iron deficiency anemia, unspecified: Secondary | ICD-10-CM | POA: Diagnosis not present

## 2015-09-03 DIAGNOSIS — N2581 Secondary hyperparathyroidism of renal origin: Secondary | ICD-10-CM | POA: Diagnosis not present

## 2015-09-06 DIAGNOSIS — N186 End stage renal disease: Secondary | ICD-10-CM | POA: Diagnosis not present

## 2015-09-06 DIAGNOSIS — D509 Iron deficiency anemia, unspecified: Secondary | ICD-10-CM | POA: Diagnosis not present

## 2015-09-06 DIAGNOSIS — N2581 Secondary hyperparathyroidism of renal origin: Secondary | ICD-10-CM | POA: Diagnosis not present

## 2015-09-08 DIAGNOSIS — N186 End stage renal disease: Secondary | ICD-10-CM | POA: Diagnosis not present

## 2015-09-08 DIAGNOSIS — D509 Iron deficiency anemia, unspecified: Secondary | ICD-10-CM | POA: Diagnosis not present

## 2015-09-08 DIAGNOSIS — N2581 Secondary hyperparathyroidism of renal origin: Secondary | ICD-10-CM | POA: Diagnosis not present

## 2015-09-08 DIAGNOSIS — I120 Hypertensive chronic kidney disease with stage 5 chronic kidney disease or end stage renal disease: Secondary | ICD-10-CM | POA: Diagnosis not present

## 2015-09-08 DIAGNOSIS — Z992 Dependence on renal dialysis: Secondary | ICD-10-CM | POA: Diagnosis not present

## 2015-09-10 DIAGNOSIS — N2581 Secondary hyperparathyroidism of renal origin: Secondary | ICD-10-CM | POA: Diagnosis not present

## 2015-09-10 DIAGNOSIS — N186 End stage renal disease: Secondary | ICD-10-CM | POA: Diagnosis not present

## 2015-09-10 DIAGNOSIS — Z23 Encounter for immunization: Secondary | ICD-10-CM | POA: Diagnosis not present

## 2015-09-13 DIAGNOSIS — N2581 Secondary hyperparathyroidism of renal origin: Secondary | ICD-10-CM | POA: Diagnosis not present

## 2015-09-13 DIAGNOSIS — Z23 Encounter for immunization: Secondary | ICD-10-CM | POA: Diagnosis not present

## 2015-09-13 DIAGNOSIS — N186 End stage renal disease: Secondary | ICD-10-CM | POA: Diagnosis not present

## 2015-09-15 DIAGNOSIS — Z23 Encounter for immunization: Secondary | ICD-10-CM | POA: Diagnosis not present

## 2015-09-15 DIAGNOSIS — N2581 Secondary hyperparathyroidism of renal origin: Secondary | ICD-10-CM | POA: Diagnosis not present

## 2015-09-15 DIAGNOSIS — N186 End stage renal disease: Secondary | ICD-10-CM | POA: Diagnosis not present

## 2015-09-17 DIAGNOSIS — N186 End stage renal disease: Secondary | ICD-10-CM | POA: Diagnosis not present

## 2015-09-17 DIAGNOSIS — Z23 Encounter for immunization: Secondary | ICD-10-CM | POA: Diagnosis not present

## 2015-09-17 DIAGNOSIS — N2581 Secondary hyperparathyroidism of renal origin: Secondary | ICD-10-CM | POA: Diagnosis not present

## 2015-09-20 DIAGNOSIS — N2581 Secondary hyperparathyroidism of renal origin: Secondary | ICD-10-CM | POA: Diagnosis not present

## 2015-09-20 DIAGNOSIS — N186 End stage renal disease: Secondary | ICD-10-CM | POA: Diagnosis not present

## 2015-09-20 DIAGNOSIS — Z23 Encounter for immunization: Secondary | ICD-10-CM | POA: Diagnosis not present

## 2015-09-21 DIAGNOSIS — M2012 Hallux valgus (acquired), left foot: Secondary | ICD-10-CM | POA: Diagnosis not present

## 2015-09-21 DIAGNOSIS — L84 Corns and callosities: Secondary | ICD-10-CM | POA: Diagnosis not present

## 2015-09-22 DIAGNOSIS — Z23 Encounter for immunization: Secondary | ICD-10-CM | POA: Diagnosis not present

## 2015-09-22 DIAGNOSIS — N2581 Secondary hyperparathyroidism of renal origin: Secondary | ICD-10-CM | POA: Diagnosis not present

## 2015-09-22 DIAGNOSIS — N186 End stage renal disease: Secondary | ICD-10-CM | POA: Diagnosis not present

## 2015-09-24 DIAGNOSIS — N2581 Secondary hyperparathyroidism of renal origin: Secondary | ICD-10-CM | POA: Diagnosis not present

## 2015-09-24 DIAGNOSIS — Z23 Encounter for immunization: Secondary | ICD-10-CM | POA: Diagnosis not present

## 2015-09-24 DIAGNOSIS — N186 End stage renal disease: Secondary | ICD-10-CM | POA: Diagnosis not present

## 2015-09-27 DIAGNOSIS — N186 End stage renal disease: Secondary | ICD-10-CM | POA: Diagnosis not present

## 2015-09-27 DIAGNOSIS — Z23 Encounter for immunization: Secondary | ICD-10-CM | POA: Diagnosis not present

## 2015-09-27 DIAGNOSIS — N2581 Secondary hyperparathyroidism of renal origin: Secondary | ICD-10-CM | POA: Diagnosis not present

## 2015-09-29 DIAGNOSIS — Z23 Encounter for immunization: Secondary | ICD-10-CM | POA: Diagnosis not present

## 2015-09-29 DIAGNOSIS — N2581 Secondary hyperparathyroidism of renal origin: Secondary | ICD-10-CM | POA: Diagnosis not present

## 2015-09-29 DIAGNOSIS — N186 End stage renal disease: Secondary | ICD-10-CM | POA: Diagnosis not present

## 2015-10-01 DIAGNOSIS — N186 End stage renal disease: Secondary | ICD-10-CM | POA: Diagnosis not present

## 2015-10-01 DIAGNOSIS — N2581 Secondary hyperparathyroidism of renal origin: Secondary | ICD-10-CM | POA: Diagnosis not present

## 2015-10-01 DIAGNOSIS — Z23 Encounter for immunization: Secondary | ICD-10-CM | POA: Diagnosis not present

## 2015-10-04 DIAGNOSIS — Z23 Encounter for immunization: Secondary | ICD-10-CM | POA: Diagnosis not present

## 2015-10-04 DIAGNOSIS — N186 End stage renal disease: Secondary | ICD-10-CM | POA: Diagnosis not present

## 2015-10-04 DIAGNOSIS — N2581 Secondary hyperparathyroidism of renal origin: Secondary | ICD-10-CM | POA: Diagnosis not present

## 2015-10-06 DIAGNOSIS — N186 End stage renal disease: Secondary | ICD-10-CM | POA: Diagnosis not present

## 2015-10-06 DIAGNOSIS — N2581 Secondary hyperparathyroidism of renal origin: Secondary | ICD-10-CM | POA: Diagnosis not present

## 2015-10-06 DIAGNOSIS — Z23 Encounter for immunization: Secondary | ICD-10-CM | POA: Diagnosis not present

## 2015-10-08 DIAGNOSIS — Z23 Encounter for immunization: Secondary | ICD-10-CM | POA: Diagnosis not present

## 2015-10-08 DIAGNOSIS — N2581 Secondary hyperparathyroidism of renal origin: Secondary | ICD-10-CM | POA: Diagnosis not present

## 2015-10-08 DIAGNOSIS — I120 Hypertensive chronic kidney disease with stage 5 chronic kidney disease or end stage renal disease: Secondary | ICD-10-CM | POA: Diagnosis not present

## 2015-10-08 DIAGNOSIS — Z992 Dependence on renal dialysis: Secondary | ICD-10-CM | POA: Diagnosis not present

## 2015-10-08 DIAGNOSIS — N186 End stage renal disease: Secondary | ICD-10-CM | POA: Diagnosis not present

## 2015-10-11 DIAGNOSIS — D509 Iron deficiency anemia, unspecified: Secondary | ICD-10-CM | POA: Diagnosis not present

## 2015-10-11 DIAGNOSIS — N2581 Secondary hyperparathyroidism of renal origin: Secondary | ICD-10-CM | POA: Diagnosis not present

## 2015-10-11 DIAGNOSIS — Z23 Encounter for immunization: Secondary | ICD-10-CM | POA: Diagnosis not present

## 2015-10-11 DIAGNOSIS — N186 End stage renal disease: Secondary | ICD-10-CM | POA: Diagnosis not present

## 2015-10-13 DIAGNOSIS — D509 Iron deficiency anemia, unspecified: Secondary | ICD-10-CM | POA: Diagnosis not present

## 2015-10-13 DIAGNOSIS — Z23 Encounter for immunization: Secondary | ICD-10-CM | POA: Diagnosis not present

## 2015-10-13 DIAGNOSIS — N186 End stage renal disease: Secondary | ICD-10-CM | POA: Diagnosis not present

## 2015-10-13 DIAGNOSIS — N2581 Secondary hyperparathyroidism of renal origin: Secondary | ICD-10-CM | POA: Diagnosis not present

## 2015-10-15 DIAGNOSIS — N186 End stage renal disease: Secondary | ICD-10-CM | POA: Diagnosis not present

## 2015-10-15 DIAGNOSIS — D509 Iron deficiency anemia, unspecified: Secondary | ICD-10-CM | POA: Diagnosis not present

## 2015-10-15 DIAGNOSIS — Z23 Encounter for immunization: Secondary | ICD-10-CM | POA: Diagnosis not present

## 2015-10-15 DIAGNOSIS — N2581 Secondary hyperparathyroidism of renal origin: Secondary | ICD-10-CM | POA: Diagnosis not present

## 2015-10-18 DIAGNOSIS — Z23 Encounter for immunization: Secondary | ICD-10-CM | POA: Diagnosis not present

## 2015-10-18 DIAGNOSIS — N186 End stage renal disease: Secondary | ICD-10-CM | POA: Diagnosis not present

## 2015-10-18 DIAGNOSIS — N2581 Secondary hyperparathyroidism of renal origin: Secondary | ICD-10-CM | POA: Diagnosis not present

## 2015-10-18 DIAGNOSIS — D509 Iron deficiency anemia, unspecified: Secondary | ICD-10-CM | POA: Diagnosis not present

## 2015-10-20 DIAGNOSIS — Z23 Encounter for immunization: Secondary | ICD-10-CM | POA: Diagnosis not present

## 2015-10-20 DIAGNOSIS — D509 Iron deficiency anemia, unspecified: Secondary | ICD-10-CM | POA: Diagnosis not present

## 2015-10-20 DIAGNOSIS — N2581 Secondary hyperparathyroidism of renal origin: Secondary | ICD-10-CM | POA: Diagnosis not present

## 2015-10-20 DIAGNOSIS — N186 End stage renal disease: Secondary | ICD-10-CM | POA: Diagnosis not present

## 2015-10-22 DIAGNOSIS — Z23 Encounter for immunization: Secondary | ICD-10-CM | POA: Diagnosis not present

## 2015-10-22 DIAGNOSIS — N2581 Secondary hyperparathyroidism of renal origin: Secondary | ICD-10-CM | POA: Diagnosis not present

## 2015-10-22 DIAGNOSIS — D509 Iron deficiency anemia, unspecified: Secondary | ICD-10-CM | POA: Diagnosis not present

## 2015-10-22 DIAGNOSIS — N186 End stage renal disease: Secondary | ICD-10-CM | POA: Diagnosis not present

## 2015-10-25 DIAGNOSIS — Z23 Encounter for immunization: Secondary | ICD-10-CM | POA: Diagnosis not present

## 2015-10-25 DIAGNOSIS — N186 End stage renal disease: Secondary | ICD-10-CM | POA: Diagnosis not present

## 2015-10-25 DIAGNOSIS — D509 Iron deficiency anemia, unspecified: Secondary | ICD-10-CM | POA: Diagnosis not present

## 2015-10-25 DIAGNOSIS — N2581 Secondary hyperparathyroidism of renal origin: Secondary | ICD-10-CM | POA: Diagnosis not present

## 2015-10-27 DIAGNOSIS — Z23 Encounter for immunization: Secondary | ICD-10-CM | POA: Diagnosis not present

## 2015-10-27 DIAGNOSIS — D509 Iron deficiency anemia, unspecified: Secondary | ICD-10-CM | POA: Diagnosis not present

## 2015-10-27 DIAGNOSIS — N2581 Secondary hyperparathyroidism of renal origin: Secondary | ICD-10-CM | POA: Diagnosis not present

## 2015-10-27 DIAGNOSIS — N186 End stage renal disease: Secondary | ICD-10-CM | POA: Diagnosis not present

## 2015-10-29 DIAGNOSIS — D509 Iron deficiency anemia, unspecified: Secondary | ICD-10-CM | POA: Diagnosis not present

## 2015-10-29 DIAGNOSIS — N2581 Secondary hyperparathyroidism of renal origin: Secondary | ICD-10-CM | POA: Diagnosis not present

## 2015-10-29 DIAGNOSIS — Z23 Encounter for immunization: Secondary | ICD-10-CM | POA: Diagnosis not present

## 2015-10-29 DIAGNOSIS — N186 End stage renal disease: Secondary | ICD-10-CM | POA: Diagnosis not present

## 2015-11-01 DIAGNOSIS — N186 End stage renal disease: Secondary | ICD-10-CM | POA: Diagnosis not present

## 2015-11-01 DIAGNOSIS — N2581 Secondary hyperparathyroidism of renal origin: Secondary | ICD-10-CM | POA: Diagnosis not present

## 2015-11-01 DIAGNOSIS — D509 Iron deficiency anemia, unspecified: Secondary | ICD-10-CM | POA: Diagnosis not present

## 2015-11-01 DIAGNOSIS — Z23 Encounter for immunization: Secondary | ICD-10-CM | POA: Diagnosis not present

## 2015-11-03 DIAGNOSIS — N186 End stage renal disease: Secondary | ICD-10-CM | POA: Diagnosis not present

## 2015-11-03 DIAGNOSIS — Z23 Encounter for immunization: Secondary | ICD-10-CM | POA: Diagnosis not present

## 2015-11-03 DIAGNOSIS — N2581 Secondary hyperparathyroidism of renal origin: Secondary | ICD-10-CM | POA: Diagnosis not present

## 2015-11-03 DIAGNOSIS — D509 Iron deficiency anemia, unspecified: Secondary | ICD-10-CM | POA: Diagnosis not present

## 2015-11-04 ENCOUNTER — Encounter: Payer: Self-pay | Admitting: Internal Medicine

## 2015-11-05 DIAGNOSIS — N2581 Secondary hyperparathyroidism of renal origin: Secondary | ICD-10-CM | POA: Diagnosis not present

## 2015-11-05 DIAGNOSIS — Z23 Encounter for immunization: Secondary | ICD-10-CM | POA: Diagnosis not present

## 2015-11-05 DIAGNOSIS — D509 Iron deficiency anemia, unspecified: Secondary | ICD-10-CM | POA: Diagnosis not present

## 2015-11-05 DIAGNOSIS — N186 End stage renal disease: Secondary | ICD-10-CM | POA: Diagnosis not present

## 2015-11-08 DIAGNOSIS — I120 Hypertensive chronic kidney disease with stage 5 chronic kidney disease or end stage renal disease: Secondary | ICD-10-CM | POA: Diagnosis not present

## 2015-11-08 DIAGNOSIS — Z992 Dependence on renal dialysis: Secondary | ICD-10-CM | POA: Diagnosis not present

## 2015-11-08 DIAGNOSIS — N2581 Secondary hyperparathyroidism of renal origin: Secondary | ICD-10-CM | POA: Diagnosis not present

## 2015-11-08 DIAGNOSIS — D509 Iron deficiency anemia, unspecified: Secondary | ICD-10-CM | POA: Diagnosis not present

## 2015-11-08 DIAGNOSIS — N186 End stage renal disease: Secondary | ICD-10-CM | POA: Diagnosis not present

## 2015-11-08 DIAGNOSIS — Z23 Encounter for immunization: Secondary | ICD-10-CM | POA: Diagnosis not present

## 2015-11-10 DIAGNOSIS — N186 End stage renal disease: Secondary | ICD-10-CM | POA: Diagnosis not present

## 2015-11-10 DIAGNOSIS — D509 Iron deficiency anemia, unspecified: Secondary | ICD-10-CM | POA: Diagnosis not present

## 2015-11-10 DIAGNOSIS — N2581 Secondary hyperparathyroidism of renal origin: Secondary | ICD-10-CM | POA: Diagnosis not present

## 2015-11-12 DIAGNOSIS — D509 Iron deficiency anemia, unspecified: Secondary | ICD-10-CM | POA: Diagnosis not present

## 2015-11-12 DIAGNOSIS — N2581 Secondary hyperparathyroidism of renal origin: Secondary | ICD-10-CM | POA: Diagnosis not present

## 2015-11-12 DIAGNOSIS — N186 End stage renal disease: Secondary | ICD-10-CM | POA: Diagnosis not present

## 2015-11-15 DIAGNOSIS — N186 End stage renal disease: Secondary | ICD-10-CM | POA: Diagnosis not present

## 2015-11-15 DIAGNOSIS — N2581 Secondary hyperparathyroidism of renal origin: Secondary | ICD-10-CM | POA: Diagnosis not present

## 2015-11-15 DIAGNOSIS — D509 Iron deficiency anemia, unspecified: Secondary | ICD-10-CM | POA: Diagnosis not present

## 2015-11-17 DIAGNOSIS — D509 Iron deficiency anemia, unspecified: Secondary | ICD-10-CM | POA: Diagnosis not present

## 2015-11-17 DIAGNOSIS — N2581 Secondary hyperparathyroidism of renal origin: Secondary | ICD-10-CM | POA: Diagnosis not present

## 2015-11-17 DIAGNOSIS — N186 End stage renal disease: Secondary | ICD-10-CM | POA: Diagnosis not present

## 2015-11-19 DIAGNOSIS — N186 End stage renal disease: Secondary | ICD-10-CM | POA: Diagnosis not present

## 2015-11-19 DIAGNOSIS — D509 Iron deficiency anemia, unspecified: Secondary | ICD-10-CM | POA: Diagnosis not present

## 2015-11-19 DIAGNOSIS — N2581 Secondary hyperparathyroidism of renal origin: Secondary | ICD-10-CM | POA: Diagnosis not present

## 2015-11-22 DIAGNOSIS — D509 Iron deficiency anemia, unspecified: Secondary | ICD-10-CM | POA: Diagnosis not present

## 2015-11-22 DIAGNOSIS — N2581 Secondary hyperparathyroidism of renal origin: Secondary | ICD-10-CM | POA: Diagnosis not present

## 2015-11-22 DIAGNOSIS — N186 End stage renal disease: Secondary | ICD-10-CM | POA: Diagnosis not present

## 2015-11-24 DIAGNOSIS — N2581 Secondary hyperparathyroidism of renal origin: Secondary | ICD-10-CM | POA: Diagnosis not present

## 2015-11-24 DIAGNOSIS — N186 End stage renal disease: Secondary | ICD-10-CM | POA: Diagnosis not present

## 2015-11-24 DIAGNOSIS — D509 Iron deficiency anemia, unspecified: Secondary | ICD-10-CM | POA: Diagnosis not present

## 2015-11-26 DIAGNOSIS — N186 End stage renal disease: Secondary | ICD-10-CM | POA: Diagnosis not present

## 2015-11-26 DIAGNOSIS — N2581 Secondary hyperparathyroidism of renal origin: Secondary | ICD-10-CM | POA: Diagnosis not present

## 2015-11-26 DIAGNOSIS — D509 Iron deficiency anemia, unspecified: Secondary | ICD-10-CM | POA: Diagnosis not present

## 2015-11-28 DIAGNOSIS — N2581 Secondary hyperparathyroidism of renal origin: Secondary | ICD-10-CM | POA: Diagnosis not present

## 2015-11-28 DIAGNOSIS — N186 End stage renal disease: Secondary | ICD-10-CM | POA: Diagnosis not present

## 2015-11-28 DIAGNOSIS — D509 Iron deficiency anemia, unspecified: Secondary | ICD-10-CM | POA: Diagnosis not present

## 2015-11-30 DIAGNOSIS — N186 End stage renal disease: Secondary | ICD-10-CM | POA: Diagnosis not present

## 2015-11-30 DIAGNOSIS — D509 Iron deficiency anemia, unspecified: Secondary | ICD-10-CM | POA: Diagnosis not present

## 2015-11-30 DIAGNOSIS — N2581 Secondary hyperparathyroidism of renal origin: Secondary | ICD-10-CM | POA: Diagnosis not present

## 2015-12-03 DIAGNOSIS — N2581 Secondary hyperparathyroidism of renal origin: Secondary | ICD-10-CM | POA: Diagnosis not present

## 2015-12-03 DIAGNOSIS — N186 End stage renal disease: Secondary | ICD-10-CM | POA: Diagnosis not present

## 2015-12-03 DIAGNOSIS — D509 Iron deficiency anemia, unspecified: Secondary | ICD-10-CM | POA: Diagnosis not present

## 2015-12-06 DIAGNOSIS — N186 End stage renal disease: Secondary | ICD-10-CM | POA: Diagnosis not present

## 2015-12-06 DIAGNOSIS — D509 Iron deficiency anemia, unspecified: Secondary | ICD-10-CM | POA: Diagnosis not present

## 2015-12-06 DIAGNOSIS — N2581 Secondary hyperparathyroidism of renal origin: Secondary | ICD-10-CM | POA: Diagnosis not present

## 2015-12-08 DIAGNOSIS — I120 Hypertensive chronic kidney disease with stage 5 chronic kidney disease or end stage renal disease: Secondary | ICD-10-CM | POA: Diagnosis not present

## 2015-12-08 DIAGNOSIS — Z992 Dependence on renal dialysis: Secondary | ICD-10-CM | POA: Diagnosis not present

## 2015-12-08 DIAGNOSIS — N186 End stage renal disease: Secondary | ICD-10-CM | POA: Diagnosis not present

## 2015-12-08 DIAGNOSIS — D509 Iron deficiency anemia, unspecified: Secondary | ICD-10-CM | POA: Diagnosis not present

## 2015-12-08 DIAGNOSIS — N2581 Secondary hyperparathyroidism of renal origin: Secondary | ICD-10-CM | POA: Diagnosis not present

## 2015-12-10 DIAGNOSIS — N2581 Secondary hyperparathyroidism of renal origin: Secondary | ICD-10-CM | POA: Diagnosis not present

## 2015-12-10 DIAGNOSIS — N186 End stage renal disease: Secondary | ICD-10-CM | POA: Diagnosis not present

## 2015-12-13 DIAGNOSIS — N2581 Secondary hyperparathyroidism of renal origin: Secondary | ICD-10-CM | POA: Diagnosis not present

## 2015-12-13 DIAGNOSIS — N186 End stage renal disease: Secondary | ICD-10-CM | POA: Diagnosis not present

## 2015-12-15 DIAGNOSIS — N186 End stage renal disease: Secondary | ICD-10-CM | POA: Diagnosis not present

## 2015-12-15 DIAGNOSIS — N2581 Secondary hyperparathyroidism of renal origin: Secondary | ICD-10-CM | POA: Diagnosis not present

## 2015-12-17 DIAGNOSIS — N2581 Secondary hyperparathyroidism of renal origin: Secondary | ICD-10-CM | POA: Diagnosis not present

## 2015-12-17 DIAGNOSIS — N186 End stage renal disease: Secondary | ICD-10-CM | POA: Diagnosis not present

## 2015-12-20 DIAGNOSIS — N186 End stage renal disease: Secondary | ICD-10-CM | POA: Diagnosis not present

## 2015-12-20 DIAGNOSIS — N2581 Secondary hyperparathyroidism of renal origin: Secondary | ICD-10-CM | POA: Diagnosis not present

## 2015-12-22 DIAGNOSIS — N2581 Secondary hyperparathyroidism of renal origin: Secondary | ICD-10-CM | POA: Diagnosis not present

## 2015-12-22 DIAGNOSIS — N186 End stage renal disease: Secondary | ICD-10-CM | POA: Diagnosis not present

## 2015-12-24 DIAGNOSIS — N186 End stage renal disease: Secondary | ICD-10-CM | POA: Diagnosis not present

## 2015-12-24 DIAGNOSIS — N2581 Secondary hyperparathyroidism of renal origin: Secondary | ICD-10-CM | POA: Diagnosis not present

## 2015-12-27 DIAGNOSIS — N2581 Secondary hyperparathyroidism of renal origin: Secondary | ICD-10-CM | POA: Diagnosis not present

## 2015-12-27 DIAGNOSIS — N186 End stage renal disease: Secondary | ICD-10-CM | POA: Diagnosis not present

## 2015-12-29 DIAGNOSIS — N186 End stage renal disease: Secondary | ICD-10-CM | POA: Diagnosis not present

## 2015-12-29 DIAGNOSIS — N2581 Secondary hyperparathyroidism of renal origin: Secondary | ICD-10-CM | POA: Diagnosis not present

## 2015-12-31 DIAGNOSIS — N186 End stage renal disease: Secondary | ICD-10-CM | POA: Diagnosis not present

## 2015-12-31 DIAGNOSIS — N2581 Secondary hyperparathyroidism of renal origin: Secondary | ICD-10-CM | POA: Diagnosis not present

## 2016-01-03 DIAGNOSIS — N186 End stage renal disease: Secondary | ICD-10-CM | POA: Diagnosis not present

## 2016-01-03 DIAGNOSIS — N2581 Secondary hyperparathyroidism of renal origin: Secondary | ICD-10-CM | POA: Diagnosis not present

## 2016-01-05 DIAGNOSIS — N186 End stage renal disease: Secondary | ICD-10-CM | POA: Diagnosis not present

## 2016-01-05 DIAGNOSIS — N2581 Secondary hyperparathyroidism of renal origin: Secondary | ICD-10-CM | POA: Diagnosis not present

## 2016-01-07 DIAGNOSIS — N186 End stage renal disease: Secondary | ICD-10-CM | POA: Diagnosis not present

## 2016-01-07 DIAGNOSIS — N2581 Secondary hyperparathyroidism of renal origin: Secondary | ICD-10-CM | POA: Diagnosis not present

## 2016-01-08 DIAGNOSIS — I120 Hypertensive chronic kidney disease with stage 5 chronic kidney disease or end stage renal disease: Secondary | ICD-10-CM | POA: Diagnosis not present

## 2016-01-08 DIAGNOSIS — N186 End stage renal disease: Secondary | ICD-10-CM | POA: Diagnosis not present

## 2016-01-08 DIAGNOSIS — Z992 Dependence on renal dialysis: Secondary | ICD-10-CM | POA: Diagnosis not present

## 2016-01-10 DIAGNOSIS — N2581 Secondary hyperparathyroidism of renal origin: Secondary | ICD-10-CM | POA: Diagnosis not present

## 2016-01-10 DIAGNOSIS — N186 End stage renal disease: Secondary | ICD-10-CM | POA: Diagnosis not present

## 2016-01-12 DIAGNOSIS — N186 End stage renal disease: Secondary | ICD-10-CM | POA: Diagnosis not present

## 2016-01-12 DIAGNOSIS — N2581 Secondary hyperparathyroidism of renal origin: Secondary | ICD-10-CM | POA: Diagnosis not present

## 2016-01-14 DIAGNOSIS — N2581 Secondary hyperparathyroidism of renal origin: Secondary | ICD-10-CM | POA: Diagnosis not present

## 2016-01-14 DIAGNOSIS — N186 End stage renal disease: Secondary | ICD-10-CM | POA: Diagnosis not present

## 2016-01-17 DIAGNOSIS — N186 End stage renal disease: Secondary | ICD-10-CM | POA: Diagnosis not present

## 2016-01-17 DIAGNOSIS — N2581 Secondary hyperparathyroidism of renal origin: Secondary | ICD-10-CM | POA: Diagnosis not present

## 2016-01-19 DIAGNOSIS — N2581 Secondary hyperparathyroidism of renal origin: Secondary | ICD-10-CM | POA: Diagnosis not present

## 2016-01-19 DIAGNOSIS — N186 End stage renal disease: Secondary | ICD-10-CM | POA: Diagnosis not present

## 2016-01-21 DIAGNOSIS — N2581 Secondary hyperparathyroidism of renal origin: Secondary | ICD-10-CM | POA: Diagnosis not present

## 2016-01-21 DIAGNOSIS — N186 End stage renal disease: Secondary | ICD-10-CM | POA: Diagnosis not present

## 2016-01-23 ENCOUNTER — Encounter: Payer: Self-pay | Admitting: Internal Medicine

## 2016-01-23 ENCOUNTER — Encounter (INDEPENDENT_AMBULATORY_CARE_PROVIDER_SITE_OTHER): Payer: Self-pay

## 2016-01-23 ENCOUNTER — Ambulatory Visit (INDEPENDENT_AMBULATORY_CARE_PROVIDER_SITE_OTHER): Payer: Medicare Other | Admitting: Internal Medicine

## 2016-01-23 VITALS — BP 160/86 | HR 64 | Ht 71.0 in | Wt 248.1 lb

## 2016-01-23 DIAGNOSIS — K521 Toxic gastroenteritis and colitis: Secondary | ICD-10-CM | POA: Diagnosis not present

## 2016-01-23 DIAGNOSIS — Z1211 Encounter for screening for malignant neoplasm of colon: Secondary | ICD-10-CM | POA: Diagnosis not present

## 2016-01-23 NOTE — Patient Instructions (Signed)
  You have been scheduled for a colonoscopy. Please follow written instructions given to you at your visit today.  Please pick up your prep supplies at the pharmacy. If you use inhalers (even only as needed), please bring them with you on the day of your procedure. Your physician has requested that you go to www.startemmi.com and enter the access code given to you at your visit today. This web site gives a general overview about your procedure. However, you should still follow specific instructions given to you by our office regarding your preparation for the procedure.     I appreciate the opportunity to care for you. Carl Gessner, MD, FACG 

## 2016-01-23 NOTE — Progress Notes (Signed)
Christopher Chapman 61 y.o. Jun 14, 1955 [id number]  Assessment & Plan:   Encounter Diagnoses  Name Primary?  . Colon cancer screening Yes  . Diarrhea due to drug - suspect ferric citrate    Seems like diarrhea is correlated with his phosphate binder and that is a known side-effect. Colchicine could also play a role Needs screening colonoscopy - will arrange.  The risks and benefits as well as alternatives of endoscopic procedure(s) have been discussed and reviewed. All questions answered. The patient agrees to proceed.  [id number], Meredith Leeds, MD    Subjective:   Chief Complaint: needs screening colonoscopy, has diarrhea  HPI 61 yo AAA man w/ ESRD on HD - last seen 12/20114 to arrange a screening colonoscopy - did not have that done. He did not have sxs then but since starting ferric citrate for phosphate binding in past 1-2 yrs he has had post-prandial dark stools that are loose. Otherwise no GI Sxs except mild anal discomfort. No fecal incontinence. No abdominal pain  Hoping to get a kdiney transplant.  No Known Allergies  Current Outpatient Prescriptions:  .  allopurinol (ZYLOPRIM) 100 MG tablet, Take 200 mg by mouth daily. , Disp: , Rfl:  .  AURYXIA 1 GM 210 MG(Fe) tablet, 3 (three) times daily. Taking 3 t.i.d, Disp: , Rfl: 12 .  Calcium Carbonate-Vitamin D (CALCIUM 500/D PO), Take 2 tablets by mouth 3 (three) times daily. , Disp: , Rfl:  .  colchicine 0.6 MG tablet, 2 (two) times a week., Disp: , Rfl:   .  losartan (COZAAR) 100 MG tablet, daily., Disp: , Rfl: 11 .  metoprolol (LOPRESSOR) 100 MG tablet, daily., Disp: , Rfl:  .  simvastatin (ZOCOR) 20 MG tablet, Take 20 mg by mouth at bedtime. , Disp: , Rfl:   Past Medical History:  Diagnosis Date  . Anemia   . Arthritis   . CRF (chronic renal failure)   . ESRD (end stage renal disease) ([place])   . Family history of anesthesia complication    [place] going to sleep.  . Gout   . Hx of cocaine abuse   .  Hyperlipidemia   . Hypertension   . Hyperuricemia   . MRSA (methicillin resistant staph aureus) culture positive 07/2011  . Parathyroid disease ([place])    Hx of , No meds at present  . Tobacco abuse   . Tuberculosis 1985   Treated with imeds for a years.   Past Surgical History:  Procedure Laterality Date  . ANGIOPLASTY  08/09/2011   Procedure: ANGIOPLASTY;  Surgeon: Angelia Mould, MD;  Location: [facility];  Service: Vascular;  Laterality: Left;  Left Braciocephalic Fistula using Vascu-Guard Patch  . APPENDECTOMY  1980  . AV FISTULA PLACEMENT Left 2010  . INCISION AND DRAINAGE  12/2012   sebacebus cyst, infected  . MANDIBLE FRACTURE SURGERY    . [name] N/A 07/16/2011   Procedure: Earney Mallet;  Surgeon: Angelia Mould, MD;  Location: [facility] CATH LAB;  Service: Cardiovascular;  Laterality: N/A;   Social History   Social History  . Marital status: Divorced    Spouse name: N/A  . Number of children: 0  . Years of education: N/A   Occupational History  . disabled    Social History Main Topics  . Smoking status: Current Every Day Smoker    Packs/day: 0.10    Years: 30.00    Types: Cigarettes  . Smokeless tobacco: Never Used     Comment: pt states that  he is aware of how to quit  . Alcohol use 2.4 oz/week    4 Cans of beer per week     Comment: occasional  . Drug use: No  . Sexual activity: Yes    Birth control/ protection: Condom   Other Topics Concern  . None   Social History Narrative  . None   Family History  Problem Relation Age of Onset  . Diabetes Father   . Heart disease Father   . Hypertension Father   . Other Father     amputation  . Diabetes Sister   . Heart disease Sister   . Colon cancer Neg Hx   . Stomach cancer Neg Hx   . Rectal cancer Neg Hx   . Esophageal cancer Neg Hx   . Liver cancer Neg Hx     Review of Systems Dialyzes Tues/Thurs?sat No dyspnea/chest pain   Objective:   Physical Exam BP (!) 160/86   Pulse 64   Ht  5\' 11"  (1.803 m)   Wt 248 lb 2 oz (112.5 kg)   BMI 34.61 kg/m  Obese NAD Eyes are anicteric Lungs cta Cor s1s2 no murmur abd obese and nontender Ext - LUE fistula w/ thrill  Data: Dialysis center notes from 2017 reviewed Hgb 11.2 10/2015

## 2016-01-24 DIAGNOSIS — N2581 Secondary hyperparathyroidism of renal origin: Secondary | ICD-10-CM | POA: Diagnosis not present

## 2016-01-24 DIAGNOSIS — N186 End stage renal disease: Secondary | ICD-10-CM | POA: Diagnosis not present

## 2016-01-26 DIAGNOSIS — N2581 Secondary hyperparathyroidism of renal origin: Secondary | ICD-10-CM | POA: Diagnosis not present

## 2016-01-26 DIAGNOSIS — N186 End stage renal disease: Secondary | ICD-10-CM | POA: Diagnosis not present

## 2016-01-31 DIAGNOSIS — N186 End stage renal disease: Secondary | ICD-10-CM | POA: Diagnosis not present

## 2016-01-31 DIAGNOSIS — N2581 Secondary hyperparathyroidism of renal origin: Secondary | ICD-10-CM | POA: Diagnosis not present

## 2016-02-02 DIAGNOSIS — N2581 Secondary hyperparathyroidism of renal origin: Secondary | ICD-10-CM | POA: Diagnosis not present

## 2016-02-02 DIAGNOSIS — N186 End stage renal disease: Secondary | ICD-10-CM | POA: Diagnosis not present

## 2016-02-04 DIAGNOSIS — N2581 Secondary hyperparathyroidism of renal origin: Secondary | ICD-10-CM | POA: Diagnosis not present

## 2016-02-04 DIAGNOSIS — N186 End stage renal disease: Secondary | ICD-10-CM | POA: Diagnosis not present

## 2016-02-07 DIAGNOSIS — N2581 Secondary hyperparathyroidism of renal origin: Secondary | ICD-10-CM | POA: Diagnosis not present

## 2016-02-07 DIAGNOSIS — N186 End stage renal disease: Secondary | ICD-10-CM | POA: Diagnosis not present

## 2016-02-08 DIAGNOSIS — Z992 Dependence on renal dialysis: Secondary | ICD-10-CM | POA: Diagnosis not present

## 2016-02-08 DIAGNOSIS — N186 End stage renal disease: Secondary | ICD-10-CM | POA: Diagnosis not present

## 2016-02-08 DIAGNOSIS — I120 Hypertensive chronic kidney disease with stage 5 chronic kidney disease or end stage renal disease: Secondary | ICD-10-CM | POA: Diagnosis not present

## 2016-02-09 DIAGNOSIS — N186 End stage renal disease: Secondary | ICD-10-CM | POA: Diagnosis not present

## 2016-02-09 DIAGNOSIS — N2581 Secondary hyperparathyroidism of renal origin: Secondary | ICD-10-CM | POA: Diagnosis not present

## 2016-02-11 DIAGNOSIS — N186 End stage renal disease: Secondary | ICD-10-CM | POA: Diagnosis not present

## 2016-02-11 DIAGNOSIS — N2581 Secondary hyperparathyroidism of renal origin: Secondary | ICD-10-CM | POA: Diagnosis not present

## 2016-02-14 DIAGNOSIS — N2581 Secondary hyperparathyroidism of renal origin: Secondary | ICD-10-CM | POA: Diagnosis not present

## 2016-02-14 DIAGNOSIS — N186 End stage renal disease: Secondary | ICD-10-CM | POA: Diagnosis not present

## 2016-02-16 DIAGNOSIS — N2581 Secondary hyperparathyroidism of renal origin: Secondary | ICD-10-CM | POA: Diagnosis not present

## 2016-02-16 DIAGNOSIS — N186 End stage renal disease: Secondary | ICD-10-CM | POA: Diagnosis not present

## 2016-02-17 ENCOUNTER — Encounter: Payer: Self-pay | Admitting: Internal Medicine

## 2016-02-17 ENCOUNTER — Ambulatory Visit (AMBULATORY_SURGERY_CENTER): Payer: Medicare Other | Admitting: Internal Medicine

## 2016-02-17 VITALS — BP 160/84 | HR 66 | Temp 97.8°F | Resp 15 | Ht 71.0 in | Wt 248.0 lb

## 2016-02-17 DIAGNOSIS — Z1211 Encounter for screening for malignant neoplasm of colon: Secondary | ICD-10-CM | POA: Diagnosis not present

## 2016-02-17 DIAGNOSIS — D123 Benign neoplasm of transverse colon: Secondary | ICD-10-CM

## 2016-02-17 DIAGNOSIS — K635 Polyp of colon: Secondary | ICD-10-CM | POA: Diagnosis not present

## 2016-02-17 DIAGNOSIS — Z1212 Encounter for screening for malignant neoplasm of rectum: Secondary | ICD-10-CM

## 2016-02-17 DIAGNOSIS — I10 Essential (primary) hypertension: Secondary | ICD-10-CM | POA: Diagnosis not present

## 2016-02-17 DIAGNOSIS — N186 End stage renal disease: Secondary | ICD-10-CM | POA: Diagnosis not present

## 2016-02-17 MED ORDER — SODIUM CHLORIDE 0.9 % IV SOLN: 500.0000 mL | INTRAVENOUS | Status: DC

## 2016-02-17 NOTE — Progress Notes (Signed)
Report to PACU, RN, vss, BBS= Clear.  

## 2016-02-17 NOTE — Progress Notes (Signed)
Called to room to assist during endoscopic procedure.  Patient ID and intended procedure confirmed with present staff. Received instructions for my participation in the procedure from the performing physician.  

## 2016-02-17 NOTE — Patient Instructions (Addendum)
I found and removed one tiny polyp that looks benign.  You also have a condition called diverticulosis - common and not usually a problem. Please read the handout provided.  I will let you know pathology results and when to have another routine colonoscopy by mail.  I appreciate the opportunity to care for you. Gatha Mayer, MD, FACG   YOU HAD AN ENDOSCOPIC PROCEDURE TODAY AT [facility]:   Refer to the procedure report that was given to you for any specific questions about what was found during the examination.  If the procedure report does not answer your questions, please call your gastroenterologist to clarify.  If you requested that your care partner not be given the details of your procedure findings, then the procedure report has been included in a sealed envelope for you to review at your convenience later.  YOU SHOULD EXPECT: Some feelings of bloating in the abdomen. Passage of more gas than usual.  Walking can help get rid of the air that was put into your GI tract during the procedure and reduce the bloating. If you had a lower endoscopy (such as a colonoscopy or flexible sigmoidoscopy) you may notice spotting of blood in your stool or on the toilet paper. If you underwent a bowel prep for your procedure, you may not have a normal bowel movement for a few days.  Please Note:  You might notice some irritation and congestion in your nose or some drainage.  This is from the oxygen used during your procedure.  There is no need for concern and it should clear up in a day or so.  SYMPTOMS TO REPORT IMMEDIATELY:   Following lower endoscopy (colonoscopy or flexible sigmoidoscopy):  Excessive amounts of blood in the stool  Significant tenderness or worsening of abdominal pains  Swelling of the abdomen that is new, acute  Fever of 100F or higher    For urgent or emergent issues, a gastroenterologist can be reached at any hour by calling [phone]  [phone].   DIET:  We do recommend a small meal at first, but then you may proceed to your regular diet.  Drink plenty of fluids but you should avoid alcoholic beverages for 24 hours.  ACTIVITY:  You should plan to take it easy for the rest of today and you should NOT DRIVE or use heavy machinery until tomorrow (because of the sedation medicines used during the test).    FOLLOW UP: Our staff will call the number listed on your records the next business day following your procedure to check on you and address any questions or concerns that you may have regarding the information given to you following your procedure. If we do not reach you, we will leave a message.  However, if you are feeling well and you are not experiencing any problems, there is no need to return our call.  We will assume that you have returned to your regular daily activities without incident.  If any biopsies were taken you will be contacted by phone or by letter within the next 1-3 weeks.  Please call us at [phone] if you have not heard about the biopsies in 3 weeks.    SIGNATURES/CONFIDENTIALITY: You and/or your care partner have signed paperwork which will be entered into your electronic medical record.  These signatures attest to the fact that that the information above on your After Visit Summary has been reviewed and is understood.  Full responsibility of the confidentiality of  this discharge information lies with you and/or your care-partner.   INFORMATION ON POLYPS AND DIVERTICULOSIS GIVEN TO YOU

## 2016-02-17 NOTE — Op Note (Signed)
[place] Patient Name: Christopher Chapman Procedure Date: 02/17/2016 3:24 PM MRN: [id number] Endoscopist: Gatha Mayer , MD Age: 61 Referring MD:  Date of Birth: 1955-12-11 Gender: Male Account #: [id number] Procedure:                Colonoscopy Indications:              Screening for colorectal malignant neoplasm, This                            is the patient's first colonoscopy Medicines:                Propofol per Anesthesia, Monitored Anesthesia Care Procedure:                Pre-Anesthesia Assessment:                           - Prior to the procedure, a History and Physical                            was performed, and patient medications and                            allergies were reviewed. The patient's tolerance of                            previous anesthesia was also reviewed. The risks                            and benefits of the procedure and the sedation                            options and risks were discussed with the patient.                            All questions were answered, and informed consent                            was obtained. Prior Anticoagulants: The patient has                            taken no previous anticoagulant or antiplatelet                            agents. ASA Grade Assessment: III - A patient with                            severe systemic disease. After reviewing the risks                            and benefits, the patient was deemed in                            satisfactory condition to undergo the procedure.  After obtaining informed consent, the colonoscope                            was passed under direct vision. Throughout the                            procedure, the patient's blood pressure, pulse, and                            oxygen saturations were monitored continuously. The                            Model CF-HQ190L [id number] scope was introduced   through the anus and advanced to the the cecum,                            identified by appendiceal orifice and ileocecal                            valve. The colonoscopy was performed without                            difficulty. The patient tolerated the procedure                            well. The quality of the bowel preparation was                            good. The ileocecal valve, appendiceal orifice, and                            rectum were photographed. Scope In: 3:27:58 PM Scope Out: 3:36:17 PM Scope Withdrawal Time: 0 hours 6 minutes 20 seconds  Total Procedure Duration: 0 hours 8 minutes 19 seconds  Findings:                 The perianal and digital rectal examinations were                            normal. Pertinent negatives include normal prostate                            (size, shape, and consistency).                           A diminutive polyp was found in the transverse                            colon. The polyp was sessile. The polyp was removed                            with a cold snare. Resection and retrieval were                            complete. Verification of  patient identification                            for the specimen was done. Estimated blood loss was                            minimal.                           Multiple small and large-mouthed diverticula were                            found in the entire colon.                           The exam was otherwise without abnormality on                            direct and retroflexion views. Complications:            No immediate complications. Estimated Blood Loss:     Estimated blood loss was minimal. Impression:               - One diminutive polyp in the transverse colon,                            removed with a cold snare. Resected and retrieved.                           - Diverticulosis in the entire examined colon.                           - The examination was otherwise  normal on direct                            and retroflexion views. Recommendation:           - Patient has a contact number available for                            emergencies. The signs and symptoms of potential                            delayed complications were discussed with the                            patient. Return to normal activities tomorrow.                            Written discharge instructions were provided to the                            patient.                           - Resume previous diet.                           -  Continue present medications.                           - Repeat colonoscopy is recommended. The                            colonoscopy date will be determined after pathology                            results from today's exam become available for                            review. Gatha Mayer, MD 02/17/2016 3:41:45 PM This report has been signed electronically.

## 2016-02-18 DIAGNOSIS — N186 End stage renal disease: Secondary | ICD-10-CM | POA: Diagnosis not present

## 2016-02-18 DIAGNOSIS — N2581 Secondary hyperparathyroidism of renal origin: Secondary | ICD-10-CM | POA: Diagnosis not present

## 2016-02-20 ENCOUNTER — Telehealth: Payer: Self-pay | Admitting: Internal Medicine

## 2016-02-20 ENCOUNTER — Telehealth: Payer: Self-pay | Admitting: *Deleted

## 2016-02-20 NOTE — Telephone Encounter (Signed)
  Follow up Call-  Call back number 02/17/2016  Post procedure Call Back phone  # [phone]  and [phone] (preferred)  Permission to leave phone message Yes  Some recent data might be hidden     Patient questions:  Do you have a fever, pain , or abdominal swelling? No. Pain Score  0 *  Have you tolerated food without any problems? Yes.    Have you been able to return to your normal activities? Yes.    Do you have any questions about your discharge instructions: Diet   No. Medications  No. Follow up visit  No.  Do you have questions or concerns about your Care? No.  Actions: * If pain score is 4 or above: No action needed, pain <4.

## 2016-02-20 NOTE — Telephone Encounter (Signed)
Patient notified of the recommendations.  

## 2016-02-20 NOTE — Telephone Encounter (Signed)
Patient reports that he has a new cough that started this am around 8:30 and pain in the RUQ with cough.  He called to ask if this has anything to do with the colonoscopy on Friday.  He is advised that this should not have anything to do with the procedure.  He would like Dr. Carlean Purl to ne notified and comment. Please advise

## 2016-02-20 NOTE — Telephone Encounter (Signed)
I agree - do not think procedure-related issue He should let his PCP know if this persists/worsens

## 2016-02-21 DIAGNOSIS — N2581 Secondary hyperparathyroidism of renal origin: Secondary | ICD-10-CM | POA: Diagnosis not present

## 2016-02-21 DIAGNOSIS — N186 End stage renal disease: Secondary | ICD-10-CM | POA: Diagnosis not present

## 2016-02-22 ENCOUNTER — Encounter: Payer: Self-pay | Admitting: Internal Medicine

## 2016-02-22 NOTE — Progress Notes (Signed)
Benign mucosal polyp - not precancerous -  10 yr recall -

## 2016-02-23 DIAGNOSIS — N2581 Secondary hyperparathyroidism of renal origin: Secondary | ICD-10-CM | POA: Diagnosis not present

## 2016-02-23 DIAGNOSIS — N186 End stage renal disease: Secondary | ICD-10-CM | POA: Diagnosis not present

## 2016-02-25 DIAGNOSIS — N186 End stage renal disease: Secondary | ICD-10-CM | POA: Diagnosis not present

## 2016-02-25 DIAGNOSIS — N2581 Secondary hyperparathyroidism of renal origin: Secondary | ICD-10-CM | POA: Diagnosis not present

## 2016-02-28 DIAGNOSIS — N186 End stage renal disease: Secondary | ICD-10-CM | POA: Diagnosis not present

## 2016-02-28 DIAGNOSIS — N2581 Secondary hyperparathyroidism of renal origin: Secondary | ICD-10-CM | POA: Diagnosis not present

## 2016-03-01 DIAGNOSIS — N2581 Secondary hyperparathyroidism of renal origin: Secondary | ICD-10-CM | POA: Diagnosis not present

## 2016-03-01 DIAGNOSIS — N186 End stage renal disease: Secondary | ICD-10-CM | POA: Diagnosis not present

## 2016-03-03 DIAGNOSIS — N2581 Secondary hyperparathyroidism of renal origin: Secondary | ICD-10-CM | POA: Diagnosis not present

## 2016-03-03 DIAGNOSIS — N186 End stage renal disease: Secondary | ICD-10-CM | POA: Diagnosis not present

## 2016-03-06 DIAGNOSIS — N2581 Secondary hyperparathyroidism of renal origin: Secondary | ICD-10-CM | POA: Diagnosis not present

## 2016-03-06 DIAGNOSIS — N186 End stage renal disease: Secondary | ICD-10-CM | POA: Diagnosis not present

## 2016-03-07 DIAGNOSIS — N186 End stage renal disease: Secondary | ICD-10-CM | POA: Diagnosis not present

## 2016-03-07 DIAGNOSIS — Z992 Dependence on renal dialysis: Secondary | ICD-10-CM | POA: Diagnosis not present

## 2016-03-07 DIAGNOSIS — I120 Hypertensive chronic kidney disease with stage 5 chronic kidney disease or end stage renal disease: Secondary | ICD-10-CM | POA: Diagnosis not present

## 2016-03-08 DIAGNOSIS — N2581 Secondary hyperparathyroidism of renal origin: Secondary | ICD-10-CM | POA: Diagnosis not present

## 2016-03-08 DIAGNOSIS — N186 End stage renal disease: Secondary | ICD-10-CM | POA: Diagnosis not present

## 2016-03-10 DIAGNOSIS — N186 End stage renal disease: Secondary | ICD-10-CM | POA: Diagnosis not present

## 2016-03-10 DIAGNOSIS — N2581 Secondary hyperparathyroidism of renal origin: Secondary | ICD-10-CM | POA: Diagnosis not present

## 2016-03-13 DIAGNOSIS — N186 End stage renal disease: Secondary | ICD-10-CM | POA: Diagnosis not present

## 2016-03-13 DIAGNOSIS — N2581 Secondary hyperparathyroidism of renal origin: Secondary | ICD-10-CM | POA: Diagnosis not present

## 2016-03-15 DIAGNOSIS — N186 End stage renal disease: Secondary | ICD-10-CM | POA: Diagnosis not present

## 2016-03-15 DIAGNOSIS — N2581 Secondary hyperparathyroidism of renal origin: Secondary | ICD-10-CM | POA: Diagnosis not present

## 2016-03-17 DIAGNOSIS — N186 End stage renal disease: Secondary | ICD-10-CM | POA: Diagnosis not present

## 2016-03-17 DIAGNOSIS — N2581 Secondary hyperparathyroidism of renal origin: Secondary | ICD-10-CM | POA: Diagnosis not present

## 2016-03-20 DIAGNOSIS — N2581 Secondary hyperparathyroidism of renal origin: Secondary | ICD-10-CM | POA: Diagnosis not present

## 2016-03-20 DIAGNOSIS — N186 End stage renal disease: Secondary | ICD-10-CM | POA: Diagnosis not present

## 2016-03-22 DIAGNOSIS — N186 End stage renal disease: Secondary | ICD-10-CM | POA: Diagnosis not present

## 2016-03-22 DIAGNOSIS — N2581 Secondary hyperparathyroidism of renal origin: Secondary | ICD-10-CM | POA: Diagnosis not present

## 2016-03-24 DIAGNOSIS — N186 End stage renal disease: Secondary | ICD-10-CM | POA: Diagnosis not present

## 2016-03-24 DIAGNOSIS — N2581 Secondary hyperparathyroidism of renal origin: Secondary | ICD-10-CM | POA: Diagnosis not present

## 2016-03-27 DIAGNOSIS — N2581 Secondary hyperparathyroidism of renal origin: Secondary | ICD-10-CM | POA: Diagnosis not present

## 2016-03-27 DIAGNOSIS — N186 End stage renal disease: Secondary | ICD-10-CM | POA: Diagnosis not present

## 2016-03-29 DIAGNOSIS — N2581 Secondary hyperparathyroidism of renal origin: Secondary | ICD-10-CM | POA: Diagnosis not present

## 2016-03-29 DIAGNOSIS — N186 End stage renal disease: Secondary | ICD-10-CM | POA: Diagnosis not present

## 2016-03-31 DIAGNOSIS — N2581 Secondary hyperparathyroidism of renal origin: Secondary | ICD-10-CM | POA: Diagnosis not present

## 2016-03-31 DIAGNOSIS — N186 End stage renal disease: Secondary | ICD-10-CM | POA: Diagnosis not present

## 2016-04-03 DIAGNOSIS — N2581 Secondary hyperparathyroidism of renal origin: Secondary | ICD-10-CM | POA: Diagnosis not present

## 2016-04-03 DIAGNOSIS — N186 End stage renal disease: Secondary | ICD-10-CM | POA: Diagnosis not present

## 2016-04-05 DIAGNOSIS — N2581 Secondary hyperparathyroidism of renal origin: Secondary | ICD-10-CM | POA: Diagnosis not present

## 2016-04-05 DIAGNOSIS — N186 End stage renal disease: Secondary | ICD-10-CM | POA: Diagnosis not present

## 2016-04-07 DIAGNOSIS — N186 End stage renal disease: Secondary | ICD-10-CM | POA: Diagnosis not present

## 2016-04-07 DIAGNOSIS — Z992 Dependence on renal dialysis: Secondary | ICD-10-CM | POA: Diagnosis not present

## 2016-04-07 DIAGNOSIS — N2581 Secondary hyperparathyroidism of renal origin: Secondary | ICD-10-CM | POA: Diagnosis not present

## 2016-04-07 DIAGNOSIS — I120 Hypertensive chronic kidney disease with stage 5 chronic kidney disease or end stage renal disease: Secondary | ICD-10-CM | POA: Diagnosis not present

## 2016-04-10 DIAGNOSIS — N2581 Secondary hyperparathyroidism of renal origin: Secondary | ICD-10-CM | POA: Diagnosis not present

## 2016-04-10 DIAGNOSIS — N186 End stage renal disease: Secondary | ICD-10-CM | POA: Diagnosis not present

## 2016-04-12 DIAGNOSIS — N2581 Secondary hyperparathyroidism of renal origin: Secondary | ICD-10-CM | POA: Diagnosis not present

## 2016-04-12 DIAGNOSIS — N186 End stage renal disease: Secondary | ICD-10-CM | POA: Diagnosis not present

## 2016-04-14 DIAGNOSIS — N186 End stage renal disease: Secondary | ICD-10-CM | POA: Diagnosis not present

## 2016-04-14 DIAGNOSIS — N2581 Secondary hyperparathyroidism of renal origin: Secondary | ICD-10-CM | POA: Diagnosis not present

## 2016-04-17 DIAGNOSIS — N186 End stage renal disease: Secondary | ICD-10-CM | POA: Diagnosis not present

## 2016-04-17 DIAGNOSIS — N2581 Secondary hyperparathyroidism of renal origin: Secondary | ICD-10-CM | POA: Diagnosis not present

## 2016-04-19 DIAGNOSIS — N186 End stage renal disease: Secondary | ICD-10-CM | POA: Diagnosis not present

## 2016-04-19 DIAGNOSIS — N2581 Secondary hyperparathyroidism of renal origin: Secondary | ICD-10-CM | POA: Diagnosis not present

## 2016-04-21 DIAGNOSIS — N186 End stage renal disease: Secondary | ICD-10-CM | POA: Diagnosis not present

## 2016-04-21 DIAGNOSIS — N2581 Secondary hyperparathyroidism of renal origin: Secondary | ICD-10-CM | POA: Diagnosis not present

## 2016-04-24 DIAGNOSIS — N2581 Secondary hyperparathyroidism of renal origin: Secondary | ICD-10-CM | POA: Diagnosis not present

## 2016-04-24 DIAGNOSIS — N186 End stage renal disease: Secondary | ICD-10-CM | POA: Diagnosis not present

## 2016-04-26 DIAGNOSIS — N186 End stage renal disease: Secondary | ICD-10-CM | POA: Diagnosis not present

## 2016-04-26 DIAGNOSIS — N2581 Secondary hyperparathyroidism of renal origin: Secondary | ICD-10-CM | POA: Diagnosis not present

## 2016-04-28 DIAGNOSIS — N186 End stage renal disease: Secondary | ICD-10-CM | POA: Diagnosis not present

## 2016-04-28 DIAGNOSIS — N2581 Secondary hyperparathyroidism of renal origin: Secondary | ICD-10-CM | POA: Diagnosis not present

## 2016-05-01 DIAGNOSIS — N2581 Secondary hyperparathyroidism of renal origin: Secondary | ICD-10-CM | POA: Diagnosis not present

## 2016-05-01 DIAGNOSIS — N186 End stage renal disease: Secondary | ICD-10-CM | POA: Diagnosis not present

## 2016-05-03 DIAGNOSIS — N186 End stage renal disease: Secondary | ICD-10-CM | POA: Diagnosis not present

## 2016-05-03 DIAGNOSIS — N2581 Secondary hyperparathyroidism of renal origin: Secondary | ICD-10-CM | POA: Diagnosis not present

## 2016-05-05 DIAGNOSIS — N2581 Secondary hyperparathyroidism of renal origin: Secondary | ICD-10-CM | POA: Diagnosis not present

## 2016-05-05 DIAGNOSIS — N186 End stage renal disease: Secondary | ICD-10-CM | POA: Diagnosis not present

## 2016-05-07 DIAGNOSIS — N186 End stage renal disease: Secondary | ICD-10-CM | POA: Diagnosis not present

## 2016-05-07 DIAGNOSIS — I120 Hypertensive chronic kidney disease with stage 5 chronic kidney disease or end stage renal disease: Secondary | ICD-10-CM | POA: Diagnosis not present

## 2016-05-07 DIAGNOSIS — Z992 Dependence on renal dialysis: Secondary | ICD-10-CM | POA: Diagnosis not present

## 2016-05-08 DIAGNOSIS — N2581 Secondary hyperparathyroidism of renal origin: Secondary | ICD-10-CM | POA: Diagnosis not present

## 2016-05-08 DIAGNOSIS — N186 End stage renal disease: Secondary | ICD-10-CM | POA: Diagnosis not present

## 2016-05-10 DIAGNOSIS — N186 End stage renal disease: Secondary | ICD-10-CM | POA: Diagnosis not present

## 2016-05-10 DIAGNOSIS — N2581 Secondary hyperparathyroidism of renal origin: Secondary | ICD-10-CM | POA: Diagnosis not present

## 2016-05-12 DIAGNOSIS — N2581 Secondary hyperparathyroidism of renal origin: Secondary | ICD-10-CM | POA: Diagnosis not present

## 2016-05-12 DIAGNOSIS — N186 End stage renal disease: Secondary | ICD-10-CM | POA: Diagnosis not present

## 2016-05-14 ENCOUNTER — Encounter (INDEPENDENT_AMBULATORY_CARE_PROVIDER_SITE_OTHER): Payer: Self-pay

## 2016-05-14 ENCOUNTER — Ambulatory Visit (INDEPENDENT_AMBULATORY_CARE_PROVIDER_SITE_OTHER): Payer: Medicare Other | Admitting: Internal Medicine

## 2016-05-14 VITALS — BP 154/81 | HR 69 | Temp 98.4°F | Ht 71.0 in | Wt 248.6 lb

## 2016-05-14 DIAGNOSIS — I120 Hypertensive chronic kidney disease with stage 5 chronic kidney disease or end stage renal disease: Secondary | ICD-10-CM

## 2016-05-14 DIAGNOSIS — Z992 Dependence on renal dialysis: Secondary | ICD-10-CM

## 2016-05-14 DIAGNOSIS — I6523 Occlusion and stenosis of bilateral carotid arteries: Secondary | ICD-10-CM | POA: Diagnosis not present

## 2016-05-14 DIAGNOSIS — I10 Essential (primary) hypertension: Secondary | ICD-10-CM

## 2016-05-14 DIAGNOSIS — N186 End stage renal disease: Secondary | ICD-10-CM

## 2016-05-14 DIAGNOSIS — Z86010 Personal history of colonic polyps: Secondary | ICD-10-CM | POA: Diagnosis not present

## 2016-05-14 DIAGNOSIS — Z79899 Other long term (current) drug therapy: Secondary | ICD-10-CM | POA: Diagnosis not present

## 2016-05-14 DIAGNOSIS — M109 Gout, unspecified: Secondary | ICD-10-CM | POA: Diagnosis not present

## 2016-05-14 DIAGNOSIS — Z8739 Personal history of other diseases of the musculoskeletal system and connective tissue: Secondary | ICD-10-CM | POA: Insufficient documentation

## 2016-05-14 DIAGNOSIS — F17211 Nicotine dependence, cigarettes, in remission: Secondary | ICD-10-CM

## 2016-05-14 DIAGNOSIS — Z0000 Encounter for general adult medical examination without abnormal findings: Secondary | ICD-10-CM | POA: Insufficient documentation

## 2016-05-14 NOTE — Patient Instructions (Addendum)
It was a pleasure seeing you today. Thank you for choosing [facility] for your healthcare needs.   Please continue to take your medications as prescribed.  I will make a referral for you to have carotid Dopplers. You will be called to schedule an appointment.  If you have any questions or need to be seen please make an appointment.

## 2016-05-14 NOTE — Assessment & Plan Note (Signed)
The patient has a history of gout. This has been added to his problem list. He will continue allopurinol 200 mg daily. -- Allopurinol 200 mg daily

## 2016-05-14 NOTE — Assessment & Plan Note (Signed)
The patient had imaging which demonstrated bilateral severe carotid artery stenosis. This has been added to his problem list. He will need further evaluation of this with bilateral carotid ultrasound. Disorders been placed. Will follow-up. -- Bilateral carotid duplex ultrasound

## 2016-05-14 NOTE — Assessment & Plan Note (Signed)
The patient has end-stage renal disease and is on hemodialysis. He follows with nephrology. Nephrology has kindly referred him to our clinic to establish primary care. -- Continue hemodialysis -- Follow nephrology recommendations

## 2016-05-14 NOTE — Progress Notes (Signed)
   CC: ESRD follow-up and to establish primary care HPI: [name] is a 61 y.o. male with a h/o of end-stage renal disease on hemodialysis and hypertension who presents as a referral from nephrology to establish primary care.    Review of Systems: Today the patient has no complaints. He denies chest pain or shortness of breath. He denies nausea, vomiting or abdominal pain.  Physical Exam: Vitals:   05/14/16 0954  BP: (!) 154/81  Pulse: 69  Temp: 98.4 F (36.9 C)  TempSrc: Oral  SpO2: 97%  Weight: 248 lb 9.6 oz (112.8 kg)  Height: 5\' 11"  (1.803 m)   General appearance: alert and cooperative Head: Normocephalic, without obvious abnormality, atraumatic Lungs: clear to auscultation bilaterally Heart: Regular rate and rhythm grade 1 holosystolic murmur Abdomen: soft, non-tender; bowel sounds normal; no masses,  no organomegaly Extremities: No lower extremity edema, vascular access intact without evidence of overlying infection  Assessment & Plan:  See encounters tab for problem based medical decision making. Patient discussed with Dr. Angelia Mould  Signed: Ophelia Shoulder, MD 05/14/2016, 10:30 AM  Pager: [phone]

## 2016-05-14 NOTE — Assessment & Plan Note (Signed)
I opened a problem list as routine adult health maintenance. Please chart future encounters regarding screening activities under this. He recently had a colonoscopy and states that at this one polyp was removed. -- Continue to follow the patient clinically and order appropriate screening as recommended.

## 2016-05-14 NOTE — Assessment & Plan Note (Signed)
Patient has essential hypertension and end-stage renal disease on hemodialysis. He will continue with hemodialysis on his normal schedule. He will continue with his daily antihypertensive medications as outlined below. -- Metoprolol 100 mg daily -- Losartan 100 mg daily

## 2016-05-15 DIAGNOSIS — N186 End stage renal disease: Secondary | ICD-10-CM | POA: Diagnosis not present

## 2016-05-15 DIAGNOSIS — N2581 Secondary hyperparathyroidism of renal origin: Secondary | ICD-10-CM | POA: Diagnosis not present

## 2016-05-16 NOTE — Progress Notes (Signed)
Internal Medicine Clinic Attending  Case discussed with Dr. Lovena Le at the time of the visit.  We reviewed the resident's history and exam and pertinent patient test results.  I agree with the assessment, diagnosis, and plan of care documented in the resident's note. I would correct that he does not currently have known severe bilateral carotid stenosis, he does have bilateral carotid atherosclerosis and I agree with plan to assess severity with bilateral carotid dopplers.

## 2016-05-17 DIAGNOSIS — N186 End stage renal disease: Secondary | ICD-10-CM | POA: Diagnosis not present

## 2016-05-17 DIAGNOSIS — N2581 Secondary hyperparathyroidism of renal origin: Secondary | ICD-10-CM | POA: Diagnosis not present

## 2016-05-19 DIAGNOSIS — N2581 Secondary hyperparathyroidism of renal origin: Secondary | ICD-10-CM | POA: Diagnosis not present

## 2016-05-19 DIAGNOSIS — N186 End stage renal disease: Secondary | ICD-10-CM | POA: Diagnosis not present

## 2016-05-22 DIAGNOSIS — N2581 Secondary hyperparathyroidism of renal origin: Secondary | ICD-10-CM | POA: Diagnosis not present

## 2016-05-22 DIAGNOSIS — N186 End stage renal disease: Secondary | ICD-10-CM | POA: Diagnosis not present

## 2016-05-24 DIAGNOSIS — N2581 Secondary hyperparathyroidism of renal origin: Secondary | ICD-10-CM | POA: Diagnosis not present

## 2016-05-24 DIAGNOSIS — N186 End stage renal disease: Secondary | ICD-10-CM | POA: Diagnosis not present

## 2016-05-26 DIAGNOSIS — N186 End stage renal disease: Secondary | ICD-10-CM | POA: Diagnosis not present

## 2016-05-26 DIAGNOSIS — N2581 Secondary hyperparathyroidism of renal origin: Secondary | ICD-10-CM | POA: Diagnosis not present

## 2016-05-29 DIAGNOSIS — N2581 Secondary hyperparathyroidism of renal origin: Secondary | ICD-10-CM | POA: Diagnosis not present

## 2016-05-29 DIAGNOSIS — N186 End stage renal disease: Secondary | ICD-10-CM | POA: Diagnosis not present

## 2016-05-31 DIAGNOSIS — N2581 Secondary hyperparathyroidism of renal origin: Secondary | ICD-10-CM | POA: Diagnosis not present

## 2016-05-31 DIAGNOSIS — N186 End stage renal disease: Secondary | ICD-10-CM | POA: Diagnosis not present

## 2016-06-02 DIAGNOSIS — N186 End stage renal disease: Secondary | ICD-10-CM | POA: Diagnosis not present

## 2016-06-02 DIAGNOSIS — N2581 Secondary hyperparathyroidism of renal origin: Secondary | ICD-10-CM | POA: Diagnosis not present

## 2016-06-05 DIAGNOSIS — N186 End stage renal disease: Secondary | ICD-10-CM | POA: Diagnosis not present

## 2016-06-05 DIAGNOSIS — N2581 Secondary hyperparathyroidism of renal origin: Secondary | ICD-10-CM | POA: Diagnosis not present

## 2016-06-07 DIAGNOSIS — I120 Hypertensive chronic kidney disease with stage 5 chronic kidney disease or end stage renal disease: Secondary | ICD-10-CM | POA: Diagnosis not present

## 2016-06-07 DIAGNOSIS — Z992 Dependence on renal dialysis: Secondary | ICD-10-CM | POA: Diagnosis not present

## 2016-06-07 DIAGNOSIS — N2581 Secondary hyperparathyroidism of renal origin: Secondary | ICD-10-CM | POA: Diagnosis not present

## 2016-06-07 DIAGNOSIS — N186 End stage renal disease: Secondary | ICD-10-CM | POA: Diagnosis not present

## 2016-06-09 DIAGNOSIS — N186 End stage renal disease: Secondary | ICD-10-CM | POA: Diagnosis not present

## 2016-06-09 DIAGNOSIS — N2581 Secondary hyperparathyroidism of renal origin: Secondary | ICD-10-CM | POA: Diagnosis not present

## 2016-06-12 DIAGNOSIS — N186 End stage renal disease: Secondary | ICD-10-CM | POA: Diagnosis not present

## 2016-06-12 DIAGNOSIS — N2581 Secondary hyperparathyroidism of renal origin: Secondary | ICD-10-CM | POA: Diagnosis not present

## 2016-06-14 DIAGNOSIS — N2581 Secondary hyperparathyroidism of renal origin: Secondary | ICD-10-CM | POA: Diagnosis not present

## 2016-06-14 DIAGNOSIS — N186 End stage renal disease: Secondary | ICD-10-CM | POA: Diagnosis not present

## 2016-06-16 DIAGNOSIS — N2581 Secondary hyperparathyroidism of renal origin: Secondary | ICD-10-CM | POA: Diagnosis not present

## 2016-06-16 DIAGNOSIS — N186 End stage renal disease: Secondary | ICD-10-CM | POA: Diagnosis not present

## 2016-06-19 DIAGNOSIS — N2581 Secondary hyperparathyroidism of renal origin: Secondary | ICD-10-CM | POA: Diagnosis not present

## 2016-06-19 DIAGNOSIS — N186 End stage renal disease: Secondary | ICD-10-CM | POA: Diagnosis not present

## 2016-06-21 DIAGNOSIS — N186 End stage renal disease: Secondary | ICD-10-CM | POA: Diagnosis not present

## 2016-06-21 DIAGNOSIS — N2581 Secondary hyperparathyroidism of renal origin: Secondary | ICD-10-CM | POA: Diagnosis not present

## 2016-06-23 DIAGNOSIS — N2581 Secondary hyperparathyroidism of renal origin: Secondary | ICD-10-CM | POA: Diagnosis not present

## 2016-06-23 DIAGNOSIS — N186 End stage renal disease: Secondary | ICD-10-CM | POA: Diagnosis not present

## 2016-06-26 DIAGNOSIS — N186 End stage renal disease: Secondary | ICD-10-CM | POA: Diagnosis not present

## 2016-06-26 DIAGNOSIS — N2581 Secondary hyperparathyroidism of renal origin: Secondary | ICD-10-CM | POA: Diagnosis not present

## 2016-06-28 DIAGNOSIS — N2581 Secondary hyperparathyroidism of renal origin: Secondary | ICD-10-CM | POA: Diagnosis not present

## 2016-06-28 DIAGNOSIS — N186 End stage renal disease: Secondary | ICD-10-CM | POA: Diagnosis not present

## 2016-06-30 DIAGNOSIS — N2581 Secondary hyperparathyroidism of renal origin: Secondary | ICD-10-CM | POA: Diagnosis not present

## 2016-06-30 DIAGNOSIS — N186 End stage renal disease: Secondary | ICD-10-CM | POA: Diagnosis not present

## 2016-07-03 DIAGNOSIS — N186 End stage renal disease: Secondary | ICD-10-CM | POA: Diagnosis not present

## 2016-07-03 DIAGNOSIS — N2581 Secondary hyperparathyroidism of renal origin: Secondary | ICD-10-CM | POA: Diagnosis not present

## 2016-07-05 DIAGNOSIS — N186 End stage renal disease: Secondary | ICD-10-CM | POA: Diagnosis not present

## 2016-07-05 DIAGNOSIS — N2581 Secondary hyperparathyroidism of renal origin: Secondary | ICD-10-CM | POA: Diagnosis not present

## 2016-07-07 DIAGNOSIS — Z992 Dependence on renal dialysis: Secondary | ICD-10-CM | POA: Diagnosis not present

## 2016-07-07 DIAGNOSIS — I120 Hypertensive chronic kidney disease with stage 5 chronic kidney disease or end stage renal disease: Secondary | ICD-10-CM | POA: Diagnosis not present

## 2016-07-07 DIAGNOSIS — N2581 Secondary hyperparathyroidism of renal origin: Secondary | ICD-10-CM | POA: Diagnosis not present

## 2016-07-07 DIAGNOSIS — N186 End stage renal disease: Secondary | ICD-10-CM | POA: Diagnosis not present

## 2016-07-10 DIAGNOSIS — N2581 Secondary hyperparathyroidism of renal origin: Secondary | ICD-10-CM | POA: Diagnosis not present

## 2016-07-10 DIAGNOSIS — N186 End stage renal disease: Secondary | ICD-10-CM | POA: Diagnosis not present

## 2016-07-12 DIAGNOSIS — N2581 Secondary hyperparathyroidism of renal origin: Secondary | ICD-10-CM | POA: Diagnosis not present

## 2016-07-12 DIAGNOSIS — N186 End stage renal disease: Secondary | ICD-10-CM | POA: Diagnosis not present

## 2016-07-14 DIAGNOSIS — N2581 Secondary hyperparathyroidism of renal origin: Secondary | ICD-10-CM | POA: Diagnosis not present

## 2016-07-14 DIAGNOSIS — N186 End stage renal disease: Secondary | ICD-10-CM | POA: Diagnosis not present

## 2016-07-17 DIAGNOSIS — N2581 Secondary hyperparathyroidism of renal origin: Secondary | ICD-10-CM | POA: Diagnosis not present

## 2016-07-17 DIAGNOSIS — N186 End stage renal disease: Secondary | ICD-10-CM | POA: Diagnosis not present

## 2016-07-19 DIAGNOSIS — N186 End stage renal disease: Secondary | ICD-10-CM | POA: Diagnosis not present

## 2016-07-19 DIAGNOSIS — N2581 Secondary hyperparathyroidism of renal origin: Secondary | ICD-10-CM | POA: Diagnosis not present

## 2016-07-21 DIAGNOSIS — N2581 Secondary hyperparathyroidism of renal origin: Secondary | ICD-10-CM | POA: Diagnosis not present

## 2016-07-21 DIAGNOSIS — N186 End stage renal disease: Secondary | ICD-10-CM | POA: Diagnosis not present

## 2016-07-24 DIAGNOSIS — N186 End stage renal disease: Secondary | ICD-10-CM | POA: Diagnosis not present

## 2016-07-24 DIAGNOSIS — N2581 Secondary hyperparathyroidism of renal origin: Secondary | ICD-10-CM | POA: Diagnosis not present

## 2016-07-26 DIAGNOSIS — N186 End stage renal disease: Secondary | ICD-10-CM | POA: Diagnosis not present

## 2016-07-26 DIAGNOSIS — N2581 Secondary hyperparathyroidism of renal origin: Secondary | ICD-10-CM | POA: Diagnosis not present

## 2016-07-28 DIAGNOSIS — N186 End stage renal disease: Secondary | ICD-10-CM | POA: Diagnosis not present

## 2016-07-28 DIAGNOSIS — N2581 Secondary hyperparathyroidism of renal origin: Secondary | ICD-10-CM | POA: Diagnosis not present

## 2016-07-31 DIAGNOSIS — N2581 Secondary hyperparathyroidism of renal origin: Secondary | ICD-10-CM | POA: Diagnosis not present

## 2016-07-31 DIAGNOSIS — N186 End stage renal disease: Secondary | ICD-10-CM | POA: Diagnosis not present

## 2016-08-02 DIAGNOSIS — N2581 Secondary hyperparathyroidism of renal origin: Secondary | ICD-10-CM | POA: Diagnosis not present

## 2016-08-02 DIAGNOSIS — N186 End stage renal disease: Secondary | ICD-10-CM | POA: Diagnosis not present

## 2016-08-04 DIAGNOSIS — N186 End stage renal disease: Secondary | ICD-10-CM | POA: Diagnosis not present

## 2016-08-04 DIAGNOSIS — N2581 Secondary hyperparathyroidism of renal origin: Secondary | ICD-10-CM | POA: Diagnosis not present

## 2016-08-07 DIAGNOSIS — Z992 Dependence on renal dialysis: Secondary | ICD-10-CM | POA: Diagnosis not present

## 2016-08-07 DIAGNOSIS — N2581 Secondary hyperparathyroidism of renal origin: Secondary | ICD-10-CM | POA: Diagnosis not present

## 2016-08-07 DIAGNOSIS — N186 End stage renal disease: Secondary | ICD-10-CM | POA: Diagnosis not present

## 2016-08-07 DIAGNOSIS — I120 Hypertensive chronic kidney disease with stage 5 chronic kidney disease or end stage renal disease: Secondary | ICD-10-CM | POA: Diagnosis not present

## 2016-08-09 DIAGNOSIS — N186 End stage renal disease: Secondary | ICD-10-CM | POA: Diagnosis not present

## 2016-08-09 DIAGNOSIS — Z23 Encounter for immunization: Secondary | ICD-10-CM | POA: Diagnosis not present

## 2016-08-09 DIAGNOSIS — N2581 Secondary hyperparathyroidism of renal origin: Secondary | ICD-10-CM | POA: Diagnosis not present

## 2016-08-11 DIAGNOSIS — N186 End stage renal disease: Secondary | ICD-10-CM | POA: Diagnosis not present

## 2016-08-11 DIAGNOSIS — Z23 Encounter for immunization: Secondary | ICD-10-CM | POA: Diagnosis not present

## 2016-08-11 DIAGNOSIS — N2581 Secondary hyperparathyroidism of renal origin: Secondary | ICD-10-CM | POA: Diagnosis not present

## 2016-08-14 DIAGNOSIS — N186 End stage renal disease: Secondary | ICD-10-CM | POA: Diagnosis not present

## 2016-08-14 DIAGNOSIS — N2581 Secondary hyperparathyroidism of renal origin: Secondary | ICD-10-CM | POA: Diagnosis not present

## 2016-08-14 DIAGNOSIS — Z23 Encounter for immunization: Secondary | ICD-10-CM | POA: Diagnosis not present

## 2016-08-16 DIAGNOSIS — N2581 Secondary hyperparathyroidism of renal origin: Secondary | ICD-10-CM | POA: Diagnosis not present

## 2016-08-16 DIAGNOSIS — Z23 Encounter for immunization: Secondary | ICD-10-CM | POA: Diagnosis not present

## 2016-08-16 DIAGNOSIS — N186 End stage renal disease: Secondary | ICD-10-CM | POA: Diagnosis not present

## 2016-08-18 DIAGNOSIS — Z23 Encounter for immunization: Secondary | ICD-10-CM | POA: Diagnosis not present

## 2016-08-18 DIAGNOSIS — N2581 Secondary hyperparathyroidism of renal origin: Secondary | ICD-10-CM | POA: Diagnosis not present

## 2016-08-18 DIAGNOSIS — N186 End stage renal disease: Secondary | ICD-10-CM | POA: Diagnosis not present

## 2016-08-21 DIAGNOSIS — Z23 Encounter for immunization: Secondary | ICD-10-CM | POA: Diagnosis not present

## 2016-08-21 DIAGNOSIS — N2581 Secondary hyperparathyroidism of renal origin: Secondary | ICD-10-CM | POA: Diagnosis not present

## 2016-08-21 DIAGNOSIS — N186 End stage renal disease: Secondary | ICD-10-CM | POA: Diagnosis not present

## 2016-08-23 DIAGNOSIS — N2581 Secondary hyperparathyroidism of renal origin: Secondary | ICD-10-CM | POA: Diagnosis not present

## 2016-08-23 DIAGNOSIS — N186 End stage renal disease: Secondary | ICD-10-CM | POA: Diagnosis not present

## 2016-08-23 DIAGNOSIS — Z23 Encounter for immunization: Secondary | ICD-10-CM | POA: Diagnosis not present

## 2016-08-25 DIAGNOSIS — Z23 Encounter for immunization: Secondary | ICD-10-CM | POA: Diagnosis not present

## 2016-08-25 DIAGNOSIS — N2581 Secondary hyperparathyroidism of renal origin: Secondary | ICD-10-CM | POA: Diagnosis not present

## 2016-08-25 DIAGNOSIS — N186 End stage renal disease: Secondary | ICD-10-CM | POA: Diagnosis not present

## 2016-08-28 DIAGNOSIS — N2581 Secondary hyperparathyroidism of renal origin: Secondary | ICD-10-CM | POA: Diagnosis not present

## 2016-08-28 DIAGNOSIS — Z23 Encounter for immunization: Secondary | ICD-10-CM | POA: Diagnosis not present

## 2016-08-28 DIAGNOSIS — N186 End stage renal disease: Secondary | ICD-10-CM | POA: Diagnosis not present

## 2016-08-30 DIAGNOSIS — Z23 Encounter for immunization: Secondary | ICD-10-CM | POA: Diagnosis not present

## 2016-08-30 DIAGNOSIS — N2581 Secondary hyperparathyroidism of renal origin: Secondary | ICD-10-CM | POA: Diagnosis not present

## 2016-08-30 DIAGNOSIS — N186 End stage renal disease: Secondary | ICD-10-CM | POA: Diagnosis not present

## 2016-09-01 DIAGNOSIS — N186 End stage renal disease: Secondary | ICD-10-CM | POA: Diagnosis not present

## 2016-09-01 DIAGNOSIS — Z23 Encounter for immunization: Secondary | ICD-10-CM | POA: Diagnosis not present

## 2016-09-01 DIAGNOSIS — N2581 Secondary hyperparathyroidism of renal origin: Secondary | ICD-10-CM | POA: Diagnosis not present

## 2016-09-04 DIAGNOSIS — N2581 Secondary hyperparathyroidism of renal origin: Secondary | ICD-10-CM | POA: Diagnosis not present

## 2016-09-04 DIAGNOSIS — N186 End stage renal disease: Secondary | ICD-10-CM | POA: Diagnosis not present

## 2016-09-04 DIAGNOSIS — Z23 Encounter for immunization: Secondary | ICD-10-CM | POA: Diagnosis not present

## 2016-09-06 DIAGNOSIS — N2581 Secondary hyperparathyroidism of renal origin: Secondary | ICD-10-CM | POA: Diagnosis not present

## 2016-09-06 DIAGNOSIS — N186 End stage renal disease: Secondary | ICD-10-CM | POA: Diagnosis not present

## 2016-09-06 DIAGNOSIS — Z23 Encounter for immunization: Secondary | ICD-10-CM | POA: Diagnosis not present

## 2016-09-07 DIAGNOSIS — N186 End stage renal disease: Secondary | ICD-10-CM | POA: Diagnosis not present

## 2016-09-07 DIAGNOSIS — Z992 Dependence on renal dialysis: Secondary | ICD-10-CM | POA: Diagnosis not present

## 2016-09-07 DIAGNOSIS — I120 Hypertensive chronic kidney disease with stage 5 chronic kidney disease or end stage renal disease: Secondary | ICD-10-CM | POA: Diagnosis not present

## 2016-09-08 DIAGNOSIS — N186 End stage renal disease: Secondary | ICD-10-CM | POA: Diagnosis not present

## 2016-09-08 DIAGNOSIS — N2581 Secondary hyperparathyroidism of renal origin: Secondary | ICD-10-CM | POA: Diagnosis not present

## 2016-09-11 DIAGNOSIS — N186 End stage renal disease: Secondary | ICD-10-CM | POA: Diagnosis not present

## 2016-09-11 DIAGNOSIS — N2581 Secondary hyperparathyroidism of renal origin: Secondary | ICD-10-CM | POA: Diagnosis not present

## 2016-09-13 DIAGNOSIS — I1311 Hypertensive heart and chronic kidney disease without heart failure, with stage 5 chronic kidney disease, or end stage renal disease: Secondary | ICD-10-CM | POA: Diagnosis not present

## 2016-09-13 DIAGNOSIS — Z01818 Encounter for other preprocedural examination: Secondary | ICD-10-CM | POA: Diagnosis not present

## 2016-09-13 DIAGNOSIS — N186 End stage renal disease: Secondary | ICD-10-CM | POA: Diagnosis not present

## 2016-09-13 DIAGNOSIS — N2581 Secondary hyperparathyroidism of renal origin: Secondary | ICD-10-CM | POA: Diagnosis not present

## 2016-09-14 DIAGNOSIS — I120 Hypertensive chronic kidney disease with stage 5 chronic kidney disease or end stage renal disease: Secondary | ICD-10-CM | POA: Diagnosis not present

## 2016-09-14 DIAGNOSIS — D631 Anemia in chronic kidney disease: Secondary | ICD-10-CM | POA: Diagnosis not present

## 2016-09-14 DIAGNOSIS — N186 End stage renal disease: Secondary | ICD-10-CM | POA: Diagnosis not present

## 2016-09-14 DIAGNOSIS — Z4822 Encounter for aftercare following kidney transplant: Secondary | ICD-10-CM | POA: Diagnosis not present

## 2016-09-14 DIAGNOSIS — E8770 Fluid overload, unspecified: Secondary | ICD-10-CM | POA: Diagnosis not present

## 2016-09-14 DIAGNOSIS — Z940 Kidney transplant status: Secondary | ICD-10-CM | POA: Diagnosis not present

## 2016-09-14 DIAGNOSIS — E871 Hypo-osmolality and hyponatremia: Secondary | ICD-10-CM | POA: Diagnosis not present

## 2016-09-14 DIAGNOSIS — R9431 Abnormal electrocardiogram [ECG] [EKG]: Secondary | ICD-10-CM | POA: Diagnosis not present

## 2016-09-15 DIAGNOSIS — Z4822 Encounter for aftercare following kidney transplant: Secondary | ICD-10-CM | POA: Diagnosis not present

## 2016-09-16 DIAGNOSIS — Z79899 Other long term (current) drug therapy: Secondary | ICD-10-CM | POA: Diagnosis not present

## 2016-09-16 DIAGNOSIS — Z940 Kidney transplant status: Secondary | ICD-10-CM | POA: Diagnosis not present

## 2016-09-16 DIAGNOSIS — E872 Acidosis: Secondary | ICD-10-CM | POA: Diagnosis not present

## 2016-09-16 DIAGNOSIS — Z5181 Encounter for therapeutic drug level monitoring: Secondary | ICD-10-CM | POA: Diagnosis not present

## 2016-09-16 DIAGNOSIS — E871 Hypo-osmolality and hyponatremia: Secondary | ICD-10-CM | POA: Diagnosis not present

## 2016-09-17 DIAGNOSIS — Z79899 Other long term (current) drug therapy: Secondary | ICD-10-CM | POA: Diagnosis not present

## 2016-09-17 DIAGNOSIS — Z940 Kidney transplant status: Secondary | ICD-10-CM | POA: Diagnosis not present

## 2016-09-17 DIAGNOSIS — Z4822 Encounter for aftercare following kidney transplant: Secondary | ICD-10-CM | POA: Diagnosis not present

## 2016-09-17 DIAGNOSIS — D631 Anemia in chronic kidney disease: Secondary | ICD-10-CM | POA: Diagnosis not present

## 2016-09-17 DIAGNOSIS — I10 Essential (primary) hypertension: Secondary | ICD-10-CM | POA: Diagnosis not present

## 2016-09-17 DIAGNOSIS — E871 Hypo-osmolality and hyponatremia: Secondary | ICD-10-CM | POA: Diagnosis not present

## 2016-09-17 DIAGNOSIS — Z5181 Encounter for therapeutic drug level monitoring: Secondary | ICD-10-CM | POA: Diagnosis not present

## 2016-09-18 DIAGNOSIS — I10 Essential (primary) hypertension: Secondary | ICD-10-CM | POA: Diagnosis not present

## 2016-09-18 DIAGNOSIS — D473 Essential (hemorrhagic) thrombocythemia: Secondary | ICD-10-CM | POA: Diagnosis not present

## 2016-09-18 DIAGNOSIS — D631 Anemia in chronic kidney disease: Secondary | ICD-10-CM | POA: Diagnosis not present

## 2016-09-18 DIAGNOSIS — E872 Acidosis: Secondary | ICD-10-CM | POA: Diagnosis not present

## 2016-09-18 DIAGNOSIS — E8770 Fluid overload, unspecified: Secondary | ICD-10-CM | POA: Diagnosis not present

## 2016-09-18 DIAGNOSIS — Z79899 Other long term (current) drug therapy: Secondary | ICD-10-CM | POA: Diagnosis not present

## 2016-09-18 DIAGNOSIS — Z4822 Encounter for aftercare following kidney transplant: Secondary | ICD-10-CM | POA: Diagnosis not present

## 2016-09-18 DIAGNOSIS — Z5181 Encounter for therapeutic drug level monitoring: Secondary | ICD-10-CM | POA: Diagnosis not present

## 2016-09-18 DIAGNOSIS — E871 Hypo-osmolality and hyponatremia: Secondary | ICD-10-CM | POA: Diagnosis not present

## 2016-09-18 DIAGNOSIS — I120 Hypertensive chronic kidney disease with stage 5 chronic kidney disease or end stage renal disease: Secondary | ICD-10-CM | POA: Diagnosis not present

## 2016-09-18 DIAGNOSIS — Z940 Kidney transplant status: Secondary | ICD-10-CM | POA: Diagnosis not present

## 2016-09-18 DIAGNOSIS — N186 End stage renal disease: Secondary | ICD-10-CM | POA: Diagnosis not present

## 2016-09-19 DIAGNOSIS — Z4822 Encounter for aftercare following kidney transplant: Secondary | ICD-10-CM | POA: Diagnosis not present

## 2016-09-19 DIAGNOSIS — Z940 Kidney transplant status: Secondary | ICD-10-CM | POA: Diagnosis not present

## 2016-09-19 DIAGNOSIS — Z5181 Encounter for therapeutic drug level monitoring: Secondary | ICD-10-CM | POA: Diagnosis not present

## 2016-09-19 DIAGNOSIS — Z79899 Other long term (current) drug therapy: Secondary | ICD-10-CM | POA: Diagnosis not present

## 2016-09-20 DIAGNOSIS — E875 Hyperkalemia: Secondary | ICD-10-CM | POA: Diagnosis not present

## 2016-09-21 DIAGNOSIS — Z7952 Long term (current) use of systemic steroids: Secondary | ICD-10-CM | POA: Diagnosis not present

## 2016-09-21 DIAGNOSIS — Z5181 Encounter for therapeutic drug level monitoring: Secondary | ICD-10-CM | POA: Diagnosis not present

## 2016-09-21 DIAGNOSIS — E785 Hyperlipidemia, unspecified: Secondary | ICD-10-CM | POA: Diagnosis not present

## 2016-09-21 DIAGNOSIS — Z79899 Other long term (current) drug therapy: Secondary | ICD-10-CM | POA: Diagnosis not present

## 2016-09-21 DIAGNOSIS — I10 Essential (primary) hypertension: Secondary | ICD-10-CM | POA: Diagnosis not present

## 2016-09-21 DIAGNOSIS — Z792 Long term (current) use of antibiotics: Secondary | ICD-10-CM | POA: Diagnosis not present

## 2016-09-21 DIAGNOSIS — Z4822 Encounter for aftercare following kidney transplant: Secondary | ICD-10-CM | POA: Diagnosis not present

## 2016-09-21 DIAGNOSIS — F17200 Nicotine dependence, unspecified, uncomplicated: Secondary | ICD-10-CM | POA: Diagnosis not present

## 2016-09-21 DIAGNOSIS — D649 Anemia, unspecified: Secondary | ICD-10-CM | POA: Diagnosis not present

## 2016-09-24 DIAGNOSIS — D849 Immunodeficiency, unspecified: Secondary | ICD-10-CM | POA: Insufficient documentation

## 2016-09-24 DIAGNOSIS — E872 Acidosis: Secondary | ICD-10-CM | POA: Diagnosis not present

## 2016-09-24 DIAGNOSIS — I151 Hypertension secondary to other renal disorders: Secondary | ICD-10-CM | POA: Diagnosis not present

## 2016-09-24 DIAGNOSIS — Z5181 Encounter for therapeutic drug level monitoring: Secondary | ICD-10-CM | POA: Diagnosis not present

## 2016-09-24 DIAGNOSIS — Z79899 Other long term (current) drug therapy: Secondary | ICD-10-CM | POA: Diagnosis not present

## 2016-09-24 DIAGNOSIS — D899 Disorder involving the immune mechanism, unspecified: Secondary | ICD-10-CM | POA: Diagnosis not present

## 2016-09-24 DIAGNOSIS — Z7952 Long term (current) use of systemic steroids: Secondary | ICD-10-CM | POA: Diagnosis not present

## 2016-09-24 DIAGNOSIS — Z4822 Encounter for aftercare following kidney transplant: Secondary | ICD-10-CM | POA: Diagnosis not present

## 2016-09-24 DIAGNOSIS — N2889 Other specified disorders of kidney and ureter: Secondary | ICD-10-CM | POA: Diagnosis not present

## 2016-09-24 DIAGNOSIS — Z940 Kidney transplant status: Secondary | ICD-10-CM | POA: Diagnosis not present

## 2016-09-24 DIAGNOSIS — E785 Hyperlipidemia, unspecified: Secondary | ICD-10-CM | POA: Diagnosis not present

## 2016-09-24 DIAGNOSIS — T8619 Other complication of kidney transplant: Secondary | ICD-10-CM | POA: Diagnosis not present

## 2016-09-24 DIAGNOSIS — Z992 Dependence on renal dialysis: Secondary | ICD-10-CM | POA: Diagnosis not present

## 2016-09-24 DIAGNOSIS — I10 Essential (primary) hypertension: Secondary | ICD-10-CM | POA: Diagnosis not present

## 2016-09-24 DIAGNOSIS — E875 Hyperkalemia: Secondary | ICD-10-CM | POA: Diagnosis not present

## 2016-09-24 DIAGNOSIS — D649 Anemia, unspecified: Secondary | ICD-10-CM | POA: Diagnosis not present

## 2016-09-24 DIAGNOSIS — Z792 Long term (current) use of antibiotics: Secondary | ICD-10-CM | POA: Diagnosis not present

## 2016-09-26 DIAGNOSIS — Z955 Presence of coronary angioplasty implant and graft: Secondary | ICD-10-CM | POA: Diagnosis not present

## 2016-09-26 DIAGNOSIS — Z792 Long term (current) use of antibiotics: Secondary | ICD-10-CM | POA: Diagnosis not present

## 2016-09-26 DIAGNOSIS — Z4822 Encounter for aftercare following kidney transplant: Secondary | ICD-10-CM | POA: Diagnosis not present

## 2016-09-26 DIAGNOSIS — F17200 Nicotine dependence, unspecified, uncomplicated: Secondary | ICD-10-CM | POA: Diagnosis not present

## 2016-09-26 DIAGNOSIS — E872 Acidosis: Secondary | ICD-10-CM | POA: Diagnosis not present

## 2016-09-26 DIAGNOSIS — D649 Anemia, unspecified: Secondary | ICD-10-CM | POA: Diagnosis not present

## 2016-09-26 DIAGNOSIS — E875 Hyperkalemia: Secondary | ICD-10-CM | POA: Diagnosis not present

## 2016-09-26 DIAGNOSIS — Z7952 Long term (current) use of systemic steroids: Secondary | ICD-10-CM | POA: Diagnosis not present

## 2016-09-26 DIAGNOSIS — D631 Anemia in chronic kidney disease: Secondary | ICD-10-CM | POA: Diagnosis not present

## 2016-09-26 DIAGNOSIS — Z7982 Long term (current) use of aspirin: Secondary | ICD-10-CM | POA: Diagnosis not present

## 2016-09-26 DIAGNOSIS — D899 Disorder involving the immune mechanism, unspecified: Secondary | ICD-10-CM | POA: Diagnosis not present

## 2016-09-26 DIAGNOSIS — E785 Hyperlipidemia, unspecified: Secondary | ICD-10-CM | POA: Diagnosis not present

## 2016-09-26 DIAGNOSIS — D8989 Other specified disorders involving the immune mechanism, not elsewhere classified: Secondary | ICD-10-CM | POA: Diagnosis not present

## 2016-09-26 DIAGNOSIS — Z940 Kidney transplant status: Secondary | ICD-10-CM | POA: Diagnosis not present

## 2016-09-26 DIAGNOSIS — N186 End stage renal disease: Secondary | ICD-10-CM | POA: Diagnosis not present

## 2016-09-26 DIAGNOSIS — Z79899 Other long term (current) drug therapy: Secondary | ICD-10-CM | POA: Diagnosis not present

## 2016-09-26 DIAGNOSIS — I10 Essential (primary) hypertension: Secondary | ICD-10-CM | POA: Diagnosis not present

## 2016-09-26 DIAGNOSIS — Z992 Dependence on renal dialysis: Secondary | ICD-10-CM | POA: Diagnosis not present

## 2016-09-26 DIAGNOSIS — I120 Hypertensive chronic kidney disease with stage 5 chronic kidney disease or end stage renal disease: Secondary | ICD-10-CM | POA: Diagnosis not present

## 2016-09-28 DIAGNOSIS — E875 Hyperkalemia: Secondary | ICD-10-CM | POA: Diagnosis not present

## 2016-09-28 DIAGNOSIS — I10 Essential (primary) hypertension: Secondary | ICD-10-CM | POA: Diagnosis not present

## 2016-09-28 DIAGNOSIS — D649 Anemia, unspecified: Secondary | ICD-10-CM | POA: Diagnosis not present

## 2016-09-28 DIAGNOSIS — E872 Acidosis: Secondary | ICD-10-CM | POA: Diagnosis not present

## 2016-09-28 DIAGNOSIS — D8989 Other specified disorders involving the immune mechanism, not elsewhere classified: Secondary | ICD-10-CM | POA: Diagnosis not present

## 2016-09-28 DIAGNOSIS — E785 Hyperlipidemia, unspecified: Secondary | ICD-10-CM | POA: Diagnosis not present

## 2016-09-28 DIAGNOSIS — Z792 Long term (current) use of antibiotics: Secondary | ICD-10-CM | POA: Diagnosis not present

## 2016-09-28 DIAGNOSIS — Z79899 Other long term (current) drug therapy: Secondary | ICD-10-CM | POA: Diagnosis not present

## 2016-09-28 DIAGNOSIS — Z940 Kidney transplant status: Secondary | ICD-10-CM | POA: Diagnosis not present

## 2016-10-01 DIAGNOSIS — Z5181 Encounter for therapeutic drug level monitoring: Secondary | ICD-10-CM | POA: Diagnosis not present

## 2016-10-01 DIAGNOSIS — E785 Hyperlipidemia, unspecified: Secondary | ICD-10-CM | POA: Diagnosis not present

## 2016-10-01 DIAGNOSIS — E872 Acidosis: Secondary | ICD-10-CM | POA: Diagnosis not present

## 2016-10-01 DIAGNOSIS — Z7952 Long term (current) use of systemic steroids: Secondary | ICD-10-CM | POA: Diagnosis not present

## 2016-10-01 DIAGNOSIS — Z940 Kidney transplant status: Secondary | ICD-10-CM | POA: Diagnosis not present

## 2016-10-01 DIAGNOSIS — Z792 Long term (current) use of antibiotics: Secondary | ICD-10-CM | POA: Diagnosis not present

## 2016-10-01 DIAGNOSIS — D8989 Other specified disorders involving the immune mechanism, not elsewhere classified: Secondary | ICD-10-CM | POA: Diagnosis not present

## 2016-10-01 DIAGNOSIS — Z4822 Encounter for aftercare following kidney transplant: Secondary | ICD-10-CM | POA: Diagnosis not present

## 2016-10-01 DIAGNOSIS — E875 Hyperkalemia: Secondary | ICD-10-CM | POA: Diagnosis not present

## 2016-10-01 DIAGNOSIS — Z955 Presence of coronary angioplasty implant and graft: Secondary | ICD-10-CM | POA: Diagnosis not present

## 2016-10-01 DIAGNOSIS — F17200 Nicotine dependence, unspecified, uncomplicated: Secondary | ICD-10-CM | POA: Diagnosis not present

## 2016-10-01 DIAGNOSIS — D649 Anemia, unspecified: Secondary | ICD-10-CM | POA: Diagnosis not present

## 2016-10-01 DIAGNOSIS — Z79899 Other long term (current) drug therapy: Secondary | ICD-10-CM | POA: Diagnosis not present

## 2016-10-01 DIAGNOSIS — I10 Essential (primary) hypertension: Secondary | ICD-10-CM | POA: Diagnosis not present

## 2016-10-02 DIAGNOSIS — Z940 Kidney transplant status: Secondary | ICD-10-CM | POA: Diagnosis not present

## 2016-10-02 DIAGNOSIS — D899 Disorder involving the immune mechanism, unspecified: Secondary | ICD-10-CM | POA: Diagnosis not present

## 2016-10-04 DIAGNOSIS — Z79899 Other long term (current) drug therapy: Secondary | ICD-10-CM | POA: Diagnosis not present

## 2016-10-04 DIAGNOSIS — Z940 Kidney transplant status: Secondary | ICD-10-CM | POA: Diagnosis not present

## 2016-10-04 DIAGNOSIS — N186 End stage renal disease: Secondary | ICD-10-CM | POA: Diagnosis not present

## 2016-10-04 DIAGNOSIS — Z792 Long term (current) use of antibiotics: Secondary | ICD-10-CM | POA: Diagnosis not present

## 2016-10-04 DIAGNOSIS — Z5181 Encounter for therapeutic drug level monitoring: Secondary | ICD-10-CM | POA: Diagnosis not present

## 2016-10-04 DIAGNOSIS — D631 Anemia in chronic kidney disease: Secondary | ICD-10-CM | POA: Diagnosis not present

## 2016-10-04 DIAGNOSIS — R197 Diarrhea, unspecified: Secondary | ICD-10-CM | POA: Diagnosis not present

## 2016-10-04 DIAGNOSIS — D899 Disorder involving the immune mechanism, unspecified: Secondary | ICD-10-CM | POA: Diagnosis not present

## 2016-10-04 DIAGNOSIS — E785 Hyperlipidemia, unspecified: Secondary | ICD-10-CM | POA: Diagnosis not present

## 2016-10-04 DIAGNOSIS — Z4802 Encounter for removal of sutures: Secondary | ICD-10-CM | POA: Diagnosis not present

## 2016-10-04 DIAGNOSIS — E872 Acidosis: Secondary | ICD-10-CM | POA: Diagnosis not present

## 2016-10-04 DIAGNOSIS — E875 Hyperkalemia: Secondary | ICD-10-CM | POA: Diagnosis not present

## 2016-10-04 DIAGNOSIS — I120 Hypertensive chronic kidney disease with stage 5 chronic kidney disease or end stage renal disease: Secondary | ICD-10-CM | POA: Diagnosis not present

## 2016-10-04 DIAGNOSIS — I10 Essential (primary) hypertension: Secondary | ICD-10-CM | POA: Diagnosis not present

## 2016-10-04 DIAGNOSIS — Z7952 Long term (current) use of systemic steroids: Secondary | ICD-10-CM | POA: Diagnosis not present

## 2016-10-04 DIAGNOSIS — D649 Anemia, unspecified: Secondary | ICD-10-CM | POA: Diagnosis not present

## 2016-10-04 DIAGNOSIS — Z4822 Encounter for aftercare following kidney transplant: Secondary | ICD-10-CM | POA: Diagnosis not present

## 2016-10-07 DIAGNOSIS — I120 Hypertensive chronic kidney disease with stage 5 chronic kidney disease or end stage renal disease: Secondary | ICD-10-CM | POA: Diagnosis not present

## 2016-10-07 DIAGNOSIS — Z992 Dependence on renal dialysis: Secondary | ICD-10-CM | POA: Diagnosis not present

## 2016-10-07 DIAGNOSIS — N186 End stage renal disease: Secondary | ICD-10-CM | POA: Diagnosis not present

## 2016-10-08 DIAGNOSIS — F17200 Nicotine dependence, unspecified, uncomplicated: Secondary | ICD-10-CM | POA: Diagnosis not present

## 2016-10-08 DIAGNOSIS — D631 Anemia in chronic kidney disease: Secondary | ICD-10-CM | POA: Diagnosis not present

## 2016-10-08 DIAGNOSIS — Z79899 Other long term (current) drug therapy: Secondary | ICD-10-CM | POA: Diagnosis not present

## 2016-10-08 DIAGNOSIS — Z792 Long term (current) use of antibiotics: Secondary | ICD-10-CM | POA: Diagnosis not present

## 2016-10-08 DIAGNOSIS — D649 Anemia, unspecified: Secondary | ICD-10-CM | POA: Diagnosis not present

## 2016-10-08 DIAGNOSIS — N186 End stage renal disease: Secondary | ICD-10-CM | POA: Diagnosis not present

## 2016-10-08 DIAGNOSIS — Z5181 Encounter for therapeutic drug level monitoring: Secondary | ICD-10-CM | POA: Diagnosis not present

## 2016-10-08 DIAGNOSIS — I10 Essential (primary) hypertension: Secondary | ICD-10-CM | POA: Diagnosis not present

## 2016-10-08 DIAGNOSIS — Z940 Kidney transplant status: Secondary | ICD-10-CM | POA: Diagnosis not present

## 2016-10-08 DIAGNOSIS — E872 Acidosis: Secondary | ICD-10-CM | POA: Diagnosis not present

## 2016-10-08 DIAGNOSIS — I120 Hypertensive chronic kidney disease with stage 5 chronic kidney disease or end stage renal disease: Secondary | ICD-10-CM | POA: Diagnosis not present

## 2016-10-08 DIAGNOSIS — E785 Hyperlipidemia, unspecified: Secondary | ICD-10-CM | POA: Diagnosis not present

## 2016-10-08 DIAGNOSIS — Z4822 Encounter for aftercare following kidney transplant: Secondary | ICD-10-CM | POA: Diagnosis not present

## 2016-10-08 DIAGNOSIS — Z7952 Long term (current) use of systemic steroids: Secondary | ICD-10-CM | POA: Diagnosis not present

## 2016-10-08 DIAGNOSIS — D899 Disorder involving the immune mechanism, unspecified: Secondary | ICD-10-CM | POA: Diagnosis not present

## 2016-10-11 DIAGNOSIS — E871 Hypo-osmolality and hyponatremia: Secondary | ICD-10-CM | POA: Diagnosis not present

## 2016-10-11 DIAGNOSIS — E872 Acidosis: Secondary | ICD-10-CM | POA: Diagnosis not present

## 2016-10-11 DIAGNOSIS — Z7982 Long term (current) use of aspirin: Secondary | ICD-10-CM | POA: Diagnosis not present

## 2016-10-11 DIAGNOSIS — Z940 Kidney transplant status: Secondary | ICD-10-CM | POA: Diagnosis not present

## 2016-10-11 DIAGNOSIS — I10 Essential (primary) hypertension: Secondary | ICD-10-CM | POA: Diagnosis not present

## 2016-10-11 DIAGNOSIS — Z79899 Other long term (current) drug therapy: Secondary | ICD-10-CM | POA: Diagnosis not present

## 2016-10-11 DIAGNOSIS — D631 Anemia in chronic kidney disease: Secondary | ICD-10-CM | POA: Diagnosis not present

## 2016-10-11 DIAGNOSIS — E785 Hyperlipidemia, unspecified: Secondary | ICD-10-CM | POA: Diagnosis not present

## 2016-10-11 DIAGNOSIS — D899 Disorder involving the immune mechanism, unspecified: Secondary | ICD-10-CM | POA: Diagnosis not present

## 2016-10-11 DIAGNOSIS — I120 Hypertensive chronic kidney disease with stage 5 chronic kidney disease or end stage renal disease: Secondary | ICD-10-CM | POA: Diagnosis not present

## 2016-10-11 DIAGNOSIS — D649 Anemia, unspecified: Secondary | ICD-10-CM | POA: Diagnosis not present

## 2016-10-11 DIAGNOSIS — Z4822 Encounter for aftercare following kidney transplant: Secondary | ICD-10-CM | POA: Diagnosis not present

## 2016-10-11 DIAGNOSIS — N390 Urinary tract infection, site not specified: Secondary | ICD-10-CM | POA: Diagnosis not present

## 2016-10-11 DIAGNOSIS — Z7952 Long term (current) use of systemic steroids: Secondary | ICD-10-CM | POA: Diagnosis not present

## 2016-10-11 DIAGNOSIS — N186 End stage renal disease: Secondary | ICD-10-CM | POA: Diagnosis not present

## 2016-10-11 DIAGNOSIS — F17200 Nicotine dependence, unspecified, uncomplicated: Secondary | ICD-10-CM | POA: Diagnosis not present

## 2016-10-11 DIAGNOSIS — E875 Hyperkalemia: Secondary | ICD-10-CM | POA: Diagnosis not present

## 2016-10-11 DIAGNOSIS — B9620 Unspecified Escherichia coli [E. coli] as the cause of diseases classified elsewhere: Secondary | ICD-10-CM | POA: Diagnosis not present

## 2016-10-15 DIAGNOSIS — D649 Anemia, unspecified: Secondary | ICD-10-CM | POA: Diagnosis not present

## 2016-10-15 DIAGNOSIS — I10 Essential (primary) hypertension: Secondary | ICD-10-CM | POA: Diagnosis not present

## 2016-10-15 DIAGNOSIS — Z792 Long term (current) use of antibiotics: Secondary | ICD-10-CM | POA: Diagnosis not present

## 2016-10-15 DIAGNOSIS — Z940 Kidney transplant status: Secondary | ICD-10-CM | POA: Diagnosis not present

## 2016-10-15 DIAGNOSIS — Z5181 Encounter for therapeutic drug level monitoring: Secondary | ICD-10-CM | POA: Diagnosis not present

## 2016-10-15 DIAGNOSIS — E872 Acidosis: Secondary | ICD-10-CM | POA: Diagnosis not present

## 2016-10-15 DIAGNOSIS — F17200 Nicotine dependence, unspecified, uncomplicated: Secondary | ICD-10-CM | POA: Diagnosis not present

## 2016-10-15 DIAGNOSIS — E785 Hyperlipidemia, unspecified: Secondary | ICD-10-CM | POA: Diagnosis not present

## 2016-10-15 DIAGNOSIS — Z4822 Encounter for aftercare following kidney transplant: Secondary | ICD-10-CM | POA: Diagnosis not present

## 2016-10-15 DIAGNOSIS — Z466 Encounter for fitting and adjustment of urinary device: Secondary | ICD-10-CM | POA: Diagnosis not present

## 2016-10-15 DIAGNOSIS — Z7952 Long term (current) use of systemic steroids: Secondary | ICD-10-CM | POA: Diagnosis not present

## 2016-10-15 DIAGNOSIS — Z79899 Other long term (current) drug therapy: Secondary | ICD-10-CM | POA: Diagnosis not present

## 2016-10-15 DIAGNOSIS — E875 Hyperkalemia: Secondary | ICD-10-CM | POA: Diagnosis not present

## 2016-10-15 DIAGNOSIS — D899 Disorder involving the immune mechanism, unspecified: Secondary | ICD-10-CM | POA: Diagnosis not present

## 2016-10-15 DIAGNOSIS — Z960 Presence of urogenital implants: Secondary | ICD-10-CM | POA: Diagnosis not present

## 2016-10-15 DIAGNOSIS — Z87440 Personal history of urinary (tract) infections: Secondary | ICD-10-CM | POA: Diagnosis not present

## 2016-10-18 DIAGNOSIS — Z940 Kidney transplant status: Secondary | ICD-10-CM | POA: Diagnosis not present

## 2016-10-22 DIAGNOSIS — D649 Anemia, unspecified: Secondary | ICD-10-CM | POA: Diagnosis not present

## 2016-10-22 DIAGNOSIS — I120 Hypertensive chronic kidney disease with stage 5 chronic kidney disease or end stage renal disease: Secondary | ICD-10-CM | POA: Diagnosis not present

## 2016-10-22 DIAGNOSIS — Z7952 Long term (current) use of systemic steroids: Secondary | ICD-10-CM | POA: Diagnosis not present

## 2016-10-22 DIAGNOSIS — Z79899 Other long term (current) drug therapy: Secondary | ICD-10-CM | POA: Diagnosis not present

## 2016-10-22 DIAGNOSIS — D899 Disorder involving the immune mechanism, unspecified: Secondary | ICD-10-CM | POA: Diagnosis not present

## 2016-10-22 DIAGNOSIS — Z7982 Long term (current) use of aspirin: Secondary | ICD-10-CM | POA: Diagnosis not present

## 2016-10-22 DIAGNOSIS — Z4822 Encounter for aftercare following kidney transplant: Secondary | ICD-10-CM | POA: Diagnosis not present

## 2016-10-22 DIAGNOSIS — I10 Essential (primary) hypertension: Secondary | ICD-10-CM | POA: Diagnosis not present

## 2016-10-22 DIAGNOSIS — E872 Acidosis: Secondary | ICD-10-CM | POA: Diagnosis not present

## 2016-10-22 DIAGNOSIS — R300 Dysuria: Secondary | ICD-10-CM | POA: Diagnosis not present

## 2016-10-22 DIAGNOSIS — N3000 Acute cystitis without hematuria: Secondary | ICD-10-CM | POA: Diagnosis not present

## 2016-10-22 DIAGNOSIS — E785 Hyperlipidemia, unspecified: Secondary | ICD-10-CM | POA: Diagnosis not present

## 2016-10-22 DIAGNOSIS — D8989 Other specified disorders involving the immune mechanism, not elsewhere classified: Secondary | ICD-10-CM | POA: Diagnosis not present

## 2016-10-22 DIAGNOSIS — D631 Anemia in chronic kidney disease: Secondary | ICD-10-CM | POA: Diagnosis not present

## 2016-10-22 DIAGNOSIS — N186 End stage renal disease: Secondary | ICD-10-CM | POA: Diagnosis not present

## 2016-10-22 DIAGNOSIS — Z87440 Personal history of urinary (tract) infections: Secondary | ICD-10-CM | POA: Diagnosis not present

## 2016-10-22 DIAGNOSIS — E875 Hyperkalemia: Secondary | ICD-10-CM | POA: Diagnosis not present

## 2016-10-22 DIAGNOSIS — Z940 Kidney transplant status: Secondary | ICD-10-CM | POA: Diagnosis not present

## 2016-11-01 DIAGNOSIS — E785 Hyperlipidemia, unspecified: Secondary | ICD-10-CM | POA: Diagnosis not present

## 2016-11-01 DIAGNOSIS — I10 Essential (primary) hypertension: Secondary | ICD-10-CM | POA: Diagnosis not present

## 2016-11-01 DIAGNOSIS — E875 Hyperkalemia: Secondary | ICD-10-CM | POA: Diagnosis not present

## 2016-11-01 DIAGNOSIS — R81 Glycosuria: Secondary | ICD-10-CM | POA: Diagnosis not present

## 2016-11-01 DIAGNOSIS — Z940 Kidney transplant status: Secondary | ICD-10-CM | POA: Diagnosis not present

## 2016-11-01 DIAGNOSIS — Z79899 Other long term (current) drug therapy: Secondary | ICD-10-CM | POA: Diagnosis not present

## 2016-11-01 DIAGNOSIS — E872 Acidosis: Secondary | ICD-10-CM | POA: Diagnosis not present

## 2016-11-01 DIAGNOSIS — Z4822 Encounter for aftercare following kidney transplant: Secondary | ICD-10-CM | POA: Diagnosis not present

## 2016-11-01 DIAGNOSIS — Z87440 Personal history of urinary (tract) infections: Secondary | ICD-10-CM | POA: Diagnosis not present

## 2016-11-01 DIAGNOSIS — Z792 Long term (current) use of antibiotics: Secondary | ICD-10-CM | POA: Diagnosis not present

## 2016-11-01 DIAGNOSIS — D649 Anemia, unspecified: Secondary | ICD-10-CM | POA: Diagnosis not present

## 2016-11-01 DIAGNOSIS — D8989 Other specified disorders involving the immune mechanism, not elsewhere classified: Secondary | ICD-10-CM | POA: Diagnosis not present

## 2016-11-01 DIAGNOSIS — F17200 Nicotine dependence, unspecified, uncomplicated: Secondary | ICD-10-CM | POA: Diagnosis not present

## 2016-11-01 DIAGNOSIS — Z5181 Encounter for therapeutic drug level monitoring: Secondary | ICD-10-CM | POA: Diagnosis not present

## 2016-11-01 DIAGNOSIS — Z7952 Long term (current) use of systemic steroids: Secondary | ICD-10-CM | POA: Diagnosis not present

## 2016-11-07 ENCOUNTER — Encounter: Payer: Medicare Other | Admitting: Internal Medicine

## 2016-11-07 DIAGNOSIS — N179 Acute kidney failure, unspecified: Secondary | ICD-10-CM | POA: Diagnosis not present

## 2016-11-07 DIAGNOSIS — D649 Anemia, unspecified: Secondary | ICD-10-CM | POA: Diagnosis not present

## 2016-11-07 DIAGNOSIS — Z4822 Encounter for aftercare following kidney transplant: Secondary | ICD-10-CM | POA: Diagnosis not present

## 2016-11-07 DIAGNOSIS — N041 Nephrotic syndrome with focal and segmental glomerular lesions: Secondary | ICD-10-CM | POA: Diagnosis not present

## 2016-11-07 DIAGNOSIS — Z940 Kidney transplant status: Secondary | ICD-10-CM | POA: Diagnosis not present

## 2016-11-07 DIAGNOSIS — D899 Disorder involving the immune mechanism, unspecified: Secondary | ICD-10-CM | POA: Diagnosis not present

## 2016-11-07 DIAGNOSIS — N1330 Unspecified hydronephrosis: Secondary | ICD-10-CM | POA: Diagnosis not present

## 2016-11-07 DIAGNOSIS — R7989 Other specified abnormal findings of blood chemistry: Secondary | ICD-10-CM | POA: Diagnosis not present

## 2016-11-07 DIAGNOSIS — Z5181 Encounter for therapeutic drug level monitoring: Secondary | ICD-10-CM | POA: Diagnosis not present

## 2016-11-07 DIAGNOSIS — R944 Abnormal results of kidney function studies: Secondary | ICD-10-CM | POA: Diagnosis not present

## 2016-11-07 DIAGNOSIS — Z79899 Other long term (current) drug therapy: Secondary | ICD-10-CM | POA: Diagnosis not present

## 2016-11-07 DIAGNOSIS — R739 Hyperglycemia, unspecified: Secondary | ICD-10-CM | POA: Diagnosis not present

## 2016-11-07 DIAGNOSIS — I10 Essential (primary) hypertension: Secondary | ICD-10-CM | POA: Diagnosis not present

## 2016-11-15 DIAGNOSIS — F17200 Nicotine dependence, unspecified, uncomplicated: Secondary | ICD-10-CM | POA: Diagnosis not present

## 2016-11-15 DIAGNOSIS — R81 Glycosuria: Secondary | ICD-10-CM | POA: Diagnosis not present

## 2016-11-15 DIAGNOSIS — I10 Essential (primary) hypertension: Secondary | ICD-10-CM | POA: Diagnosis not present

## 2016-11-15 DIAGNOSIS — Z79899 Other long term (current) drug therapy: Secondary | ICD-10-CM | POA: Diagnosis not present

## 2016-11-15 DIAGNOSIS — E872 Acidosis: Secondary | ICD-10-CM | POA: Diagnosis not present

## 2016-11-15 DIAGNOSIS — Z792 Long term (current) use of antibiotics: Secondary | ICD-10-CM | POA: Diagnosis not present

## 2016-11-15 DIAGNOSIS — E875 Hyperkalemia: Secondary | ICD-10-CM | POA: Diagnosis not present

## 2016-11-15 DIAGNOSIS — Z940 Kidney transplant status: Secondary | ICD-10-CM | POA: Diagnosis not present

## 2016-11-15 DIAGNOSIS — E785 Hyperlipidemia, unspecified: Secondary | ICD-10-CM | POA: Diagnosis not present

## 2016-11-15 DIAGNOSIS — Z7952 Long term (current) use of systemic steroids: Secondary | ICD-10-CM | POA: Diagnosis not present

## 2016-11-15 DIAGNOSIS — D649 Anemia, unspecified: Secondary | ICD-10-CM | POA: Diagnosis not present

## 2016-11-15 DIAGNOSIS — Z87440 Personal history of urinary (tract) infections: Secondary | ICD-10-CM | POA: Diagnosis not present

## 2016-11-15 DIAGNOSIS — Z7289 Other problems related to lifestyle: Secondary | ICD-10-CM | POA: Diagnosis not present

## 2016-11-15 DIAGNOSIS — Z5181 Encounter for therapeutic drug level monitoring: Secondary | ICD-10-CM | POA: Diagnosis not present

## 2016-11-15 DIAGNOSIS — Z87898 Personal history of other specified conditions: Secondary | ICD-10-CM | POA: Diagnosis not present

## 2016-11-15 DIAGNOSIS — Z4822 Encounter for aftercare following kidney transplant: Secondary | ICD-10-CM | POA: Diagnosis not present

## 2016-11-15 DIAGNOSIS — D899 Disorder involving the immune mechanism, unspecified: Secondary | ICD-10-CM | POA: Diagnosis not present

## 2016-11-22 DIAGNOSIS — E872 Acidosis: Secondary | ICD-10-CM | POA: Diagnosis not present

## 2016-11-22 DIAGNOSIS — I10 Essential (primary) hypertension: Secondary | ICD-10-CM | POA: Diagnosis not present

## 2016-11-22 DIAGNOSIS — E875 Hyperkalemia: Secondary | ICD-10-CM | POA: Diagnosis not present

## 2016-11-22 DIAGNOSIS — E7439 Other disorders of intestinal carbohydrate absorption: Secondary | ICD-10-CM | POA: Diagnosis not present

## 2016-11-22 DIAGNOSIS — E785 Hyperlipidemia, unspecified: Secondary | ICD-10-CM | POA: Diagnosis not present

## 2016-11-22 DIAGNOSIS — Z792 Long term (current) use of antibiotics: Secondary | ICD-10-CM | POA: Diagnosis not present

## 2016-11-22 DIAGNOSIS — Z79899 Other long term (current) drug therapy: Secondary | ICD-10-CM | POA: Diagnosis not present

## 2016-11-22 DIAGNOSIS — D649 Anemia, unspecified: Secondary | ICD-10-CM | POA: Diagnosis not present

## 2016-11-22 DIAGNOSIS — Z5181 Encounter for therapeutic drug level monitoring: Secondary | ICD-10-CM | POA: Diagnosis not present

## 2016-11-22 DIAGNOSIS — D8989 Other specified disorders involving the immune mechanism, not elsewhere classified: Secondary | ICD-10-CM | POA: Diagnosis not present

## 2016-11-22 DIAGNOSIS — Z940 Kidney transplant status: Secondary | ICD-10-CM | POA: Diagnosis not present

## 2016-11-22 DIAGNOSIS — Z7952 Long term (current) use of systemic steroids: Secondary | ICD-10-CM | POA: Diagnosis not present

## 2016-11-22 DIAGNOSIS — Z4822 Encounter for aftercare following kidney transplant: Secondary | ICD-10-CM | POA: Diagnosis not present

## 2016-11-27 DIAGNOSIS — Z792 Long term (current) use of antibiotics: Secondary | ICD-10-CM | POA: Diagnosis not present

## 2016-11-27 DIAGNOSIS — I150 Renovascular hypertension: Secondary | ICD-10-CM | POA: Diagnosis not present

## 2016-11-27 DIAGNOSIS — E872 Acidosis: Secondary | ICD-10-CM | POA: Diagnosis not present

## 2016-11-27 DIAGNOSIS — I10 Essential (primary) hypertension: Secondary | ICD-10-CM | POA: Diagnosis not present

## 2016-11-27 DIAGNOSIS — D899 Disorder involving the immune mechanism, unspecified: Secondary | ICD-10-CM | POA: Diagnosis not present

## 2016-11-27 DIAGNOSIS — N186 End stage renal disease: Secondary | ICD-10-CM | POA: Diagnosis not present

## 2016-11-27 DIAGNOSIS — Z4822 Encounter for aftercare following kidney transplant: Secondary | ICD-10-CM | POA: Diagnosis not present

## 2016-11-27 DIAGNOSIS — Z01818 Encounter for other preprocedural examination: Secondary | ICD-10-CM | POA: Diagnosis not present

## 2016-11-27 DIAGNOSIS — E7439 Other disorders of intestinal carbohydrate absorption: Secondary | ICD-10-CM | POA: Diagnosis not present

## 2016-11-27 DIAGNOSIS — Z940 Kidney transplant status: Secondary | ICD-10-CM | POA: Diagnosis not present

## 2016-11-27 DIAGNOSIS — F17200 Nicotine dependence, unspecified, uncomplicated: Secondary | ICD-10-CM | POA: Diagnosis not present

## 2016-11-27 DIAGNOSIS — E875 Hyperkalemia: Secondary | ICD-10-CM | POA: Diagnosis not present

## 2016-11-27 DIAGNOSIS — Z87440 Personal history of urinary (tract) infections: Secondary | ICD-10-CM | POA: Diagnosis not present

## 2016-11-27 DIAGNOSIS — Z5181 Encounter for therapeutic drug level monitoring: Secondary | ICD-10-CM | POA: Diagnosis not present

## 2016-11-27 DIAGNOSIS — D649 Anemia, unspecified: Secondary | ICD-10-CM | POA: Diagnosis not present

## 2016-11-27 DIAGNOSIS — N390 Urinary tract infection, site not specified: Secondary | ICD-10-CM | POA: Diagnosis not present

## 2016-11-27 DIAGNOSIS — Z79899 Other long term (current) drug therapy: Secondary | ICD-10-CM | POA: Diagnosis not present

## 2016-11-27 DIAGNOSIS — Z7952 Long term (current) use of systemic steroids: Secondary | ICD-10-CM | POA: Diagnosis not present

## 2016-11-27 DIAGNOSIS — E785 Hyperlipidemia, unspecified: Secondary | ICD-10-CM | POA: Diagnosis not present

## 2016-12-06 DIAGNOSIS — Z8619 Personal history of other infectious and parasitic diseases: Secondary | ICD-10-CM | POA: Diagnosis not present

## 2016-12-06 DIAGNOSIS — Z79899 Other long term (current) drug therapy: Secondary | ICD-10-CM | POA: Diagnosis not present

## 2016-12-06 DIAGNOSIS — D72819 Decreased white blood cell count, unspecified: Secondary | ICD-10-CM | POA: Diagnosis not present

## 2016-12-06 DIAGNOSIS — E872 Acidosis: Secondary | ICD-10-CM | POA: Diagnosis not present

## 2016-12-06 DIAGNOSIS — D8989 Other specified disorders involving the immune mechanism, not elsewhere classified: Secondary | ICD-10-CM | POA: Diagnosis not present

## 2016-12-06 DIAGNOSIS — Z4822 Encounter for aftercare following kidney transplant: Secondary | ICD-10-CM | POA: Diagnosis not present

## 2016-12-06 DIAGNOSIS — E785 Hyperlipidemia, unspecified: Secondary | ICD-10-CM | POA: Diagnosis not present

## 2016-12-06 DIAGNOSIS — Z5181 Encounter for therapeutic drug level monitoring: Secondary | ICD-10-CM | POA: Diagnosis not present

## 2016-12-06 DIAGNOSIS — Z87440 Personal history of urinary (tract) infections: Secondary | ICD-10-CM | POA: Diagnosis not present

## 2016-12-06 DIAGNOSIS — D649 Anemia, unspecified: Secondary | ICD-10-CM | POA: Diagnosis not present

## 2016-12-06 DIAGNOSIS — R7303 Prediabetes: Secondary | ICD-10-CM | POA: Diagnosis not present

## 2016-12-06 DIAGNOSIS — Z7952 Long term (current) use of systemic steroids: Secondary | ICD-10-CM | POA: Diagnosis not present

## 2016-12-06 DIAGNOSIS — I10 Essential (primary) hypertension: Secondary | ICD-10-CM | POA: Diagnosis not present

## 2016-12-06 DIAGNOSIS — Z940 Kidney transplant status: Secondary | ICD-10-CM | POA: Diagnosis not present

## 2016-12-06 DIAGNOSIS — Z792 Long term (current) use of antibiotics: Secondary | ICD-10-CM | POA: Diagnosis not present

## 2016-12-13 DIAGNOSIS — T8619 Other complication of kidney transplant: Secondary | ICD-10-CM | POA: Diagnosis not present

## 2016-12-13 DIAGNOSIS — Z7952 Long term (current) use of systemic steroids: Secondary | ICD-10-CM | POA: Diagnosis not present

## 2016-12-13 DIAGNOSIS — Z8619 Personal history of other infectious and parasitic diseases: Secondary | ICD-10-CM | POA: Diagnosis not present

## 2016-12-13 DIAGNOSIS — Z940 Kidney transplant status: Secondary | ICD-10-CM | POA: Diagnosis not present

## 2016-12-13 DIAGNOSIS — Z79899 Other long term (current) drug therapy: Secondary | ICD-10-CM | POA: Diagnosis not present

## 2016-12-13 DIAGNOSIS — D649 Anemia, unspecified: Secondary | ICD-10-CM | POA: Diagnosis not present

## 2016-12-13 DIAGNOSIS — D72819 Decreased white blood cell count, unspecified: Secondary | ICD-10-CM | POA: Diagnosis not present

## 2016-12-13 DIAGNOSIS — Z4822 Encounter for aftercare following kidney transplant: Secondary | ICD-10-CM | POA: Diagnosis not present

## 2016-12-13 DIAGNOSIS — R7303 Prediabetes: Secondary | ICD-10-CM | POA: Diagnosis not present

## 2016-12-13 DIAGNOSIS — Z792 Long term (current) use of antibiotics: Secondary | ICD-10-CM | POA: Diagnosis not present

## 2016-12-13 DIAGNOSIS — D899 Disorder involving the immune mechanism, unspecified: Secondary | ICD-10-CM | POA: Diagnosis not present

## 2016-12-13 DIAGNOSIS — I10 Essential (primary) hypertension: Secondary | ICD-10-CM | POA: Diagnosis not present

## 2016-12-13 DIAGNOSIS — Z87891 Personal history of nicotine dependence: Secondary | ICD-10-CM | POA: Diagnosis not present

## 2016-12-13 DIAGNOSIS — Z87440 Personal history of urinary (tract) infections: Secondary | ICD-10-CM | POA: Diagnosis not present

## 2016-12-27 DIAGNOSIS — Z792 Long term (current) use of antibiotics: Secondary | ICD-10-CM | POA: Diagnosis not present

## 2016-12-27 DIAGNOSIS — Z87440 Personal history of urinary (tract) infections: Secondary | ICD-10-CM | POA: Diagnosis not present

## 2016-12-27 DIAGNOSIS — D8989 Other specified disorders involving the immune mechanism, not elsewhere classified: Secondary | ICD-10-CM | POA: Diagnosis not present

## 2016-12-27 DIAGNOSIS — Z7952 Long term (current) use of systemic steroids: Secondary | ICD-10-CM | POA: Diagnosis not present

## 2016-12-27 DIAGNOSIS — E785 Hyperlipidemia, unspecified: Secondary | ICD-10-CM | POA: Diagnosis not present

## 2016-12-27 DIAGNOSIS — E869 Volume depletion, unspecified: Secondary | ICD-10-CM | POA: Diagnosis not present

## 2016-12-27 DIAGNOSIS — D649 Anemia, unspecified: Secondary | ICD-10-CM | POA: Diagnosis not present

## 2016-12-27 DIAGNOSIS — Z5181 Encounter for therapeutic drug level monitoring: Secondary | ICD-10-CM | POA: Diagnosis not present

## 2016-12-27 DIAGNOSIS — Z4822 Encounter for aftercare following kidney transplant: Secondary | ICD-10-CM | POA: Diagnosis not present

## 2016-12-27 DIAGNOSIS — Z79899 Other long term (current) drug therapy: Secondary | ICD-10-CM | POA: Diagnosis not present

## 2016-12-27 DIAGNOSIS — E872 Acidosis: Secondary | ICD-10-CM | POA: Diagnosis not present

## 2016-12-27 DIAGNOSIS — H25812 Combined forms of age-related cataract, left eye: Secondary | ICD-10-CM | POA: Diagnosis not present

## 2016-12-27 DIAGNOSIS — R7303 Prediabetes: Secondary | ICD-10-CM | POA: Diagnosis not present

## 2016-12-27 DIAGNOSIS — I10 Essential (primary) hypertension: Secondary | ICD-10-CM | POA: Diagnosis not present

## 2016-12-27 DIAGNOSIS — H2511 Age-related nuclear cataract, right eye: Secondary | ICD-10-CM | POA: Diagnosis not present

## 2016-12-27 DIAGNOSIS — Z940 Kidney transplant status: Secondary | ICD-10-CM | POA: Diagnosis not present

## 2017-01-14 DIAGNOSIS — Z940 Kidney transplant status: Secondary | ICD-10-CM | POA: Diagnosis not present

## 2017-01-14 DIAGNOSIS — R197 Diarrhea, unspecified: Secondary | ICD-10-CM | POA: Diagnosis not present

## 2017-01-14 DIAGNOSIS — B259 Cytomegaloviral disease, unspecified: Secondary | ICD-10-CM | POA: Diagnosis not present

## 2017-01-14 DIAGNOSIS — Z79899 Other long term (current) drug therapy: Secondary | ICD-10-CM | POA: Diagnosis not present

## 2017-01-15 DIAGNOSIS — Z7982 Long term (current) use of aspirin: Secondary | ICD-10-CM | POA: Diagnosis not present

## 2017-01-15 DIAGNOSIS — B258 Other cytomegaloviral diseases: Secondary | ICD-10-CM | POA: Diagnosis present

## 2017-01-15 DIAGNOSIS — Z79899 Other long term (current) drug therapy: Secondary | ICD-10-CM | POA: Diagnosis not present

## 2017-01-15 DIAGNOSIS — K219 Gastro-esophageal reflux disease without esophagitis: Secondary | ICD-10-CM | POA: Diagnosis present

## 2017-01-15 DIAGNOSIS — B259 Cytomegaloviral disease, unspecified: Secondary | ICD-10-CM | POA: Diagnosis not present

## 2017-01-15 DIAGNOSIS — R634 Abnormal weight loss: Secondary | ICD-10-CM | POA: Diagnosis present

## 2017-01-15 DIAGNOSIS — Z87891 Personal history of nicotine dependence: Secondary | ICD-10-CM | POA: Diagnosis not present

## 2017-01-15 DIAGNOSIS — Z833 Family history of diabetes mellitus: Secondary | ICD-10-CM | POA: Diagnosis not present

## 2017-01-15 DIAGNOSIS — I10 Essential (primary) hypertension: Secondary | ICD-10-CM | POA: Diagnosis present

## 2017-01-15 DIAGNOSIS — Z8249 Family history of ischemic heart disease and other diseases of the circulatory system: Secondary | ICD-10-CM | POA: Diagnosis not present

## 2017-01-15 DIAGNOSIS — Z9049 Acquired absence of other specified parts of digestive tract: Secondary | ICD-10-CM | POA: Diagnosis not present

## 2017-01-15 DIAGNOSIS — Z7952 Long term (current) use of systemic steroids: Secondary | ICD-10-CM | POA: Diagnosis not present

## 2017-01-15 DIAGNOSIS — Z841 Family history of disorders of kidney and ureter: Secondary | ICD-10-CM | POA: Diagnosis not present

## 2017-01-15 DIAGNOSIS — R944 Abnormal results of kidney function studies: Secondary | ICD-10-CM | POA: Diagnosis not present

## 2017-01-15 DIAGNOSIS — R197 Diarrhea, unspecified: Secondary | ICD-10-CM | POA: Diagnosis present

## 2017-01-15 DIAGNOSIS — D649 Anemia, unspecified: Secondary | ICD-10-CM | POA: Diagnosis present

## 2017-01-15 DIAGNOSIS — M109 Gout, unspecified: Secondary | ICD-10-CM | POA: Diagnosis present

## 2017-01-15 DIAGNOSIS — M1990 Unspecified osteoarthritis, unspecified site: Secondary | ICD-10-CM | POA: Diagnosis present

## 2017-01-15 DIAGNOSIS — E872 Acidosis: Secondary | ICD-10-CM | POA: Diagnosis present

## 2017-01-15 DIAGNOSIS — Z940 Kidney transplant status: Secondary | ICD-10-CM | POA: Diagnosis not present

## 2017-01-21 DIAGNOSIS — Z5181 Encounter for therapeutic drug level monitoring: Secondary | ICD-10-CM | POA: Diagnosis not present

## 2017-01-21 DIAGNOSIS — I150 Renovascular hypertension: Secondary | ICD-10-CM | POA: Diagnosis not present

## 2017-01-21 DIAGNOSIS — Z4822 Encounter for aftercare following kidney transplant: Secondary | ICD-10-CM | POA: Diagnosis not present

## 2017-01-21 DIAGNOSIS — N50819 Testicular pain, unspecified: Secondary | ICD-10-CM | POA: Diagnosis not present

## 2017-01-21 DIAGNOSIS — E785 Hyperlipidemia, unspecified: Secondary | ICD-10-CM | POA: Diagnosis not present

## 2017-01-21 DIAGNOSIS — R7989 Other specified abnormal findings of blood chemistry: Secondary | ICD-10-CM | POA: Diagnosis not present

## 2017-01-21 DIAGNOSIS — I10 Essential (primary) hypertension: Secondary | ICD-10-CM | POA: Diagnosis not present

## 2017-01-21 DIAGNOSIS — I951 Orthostatic hypotension: Secondary | ICD-10-CM | POA: Diagnosis not present

## 2017-01-21 DIAGNOSIS — B259 Cytomegaloviral disease, unspecified: Secondary | ICD-10-CM | POA: Diagnosis not present

## 2017-01-21 DIAGNOSIS — Z79899 Other long term (current) drug therapy: Secondary | ICD-10-CM | POA: Diagnosis not present

## 2017-01-21 DIAGNOSIS — E869 Volume depletion, unspecified: Secondary | ICD-10-CM | POA: Diagnosis not present

## 2017-01-21 DIAGNOSIS — E875 Hyperkalemia: Secondary | ICD-10-CM | POA: Diagnosis not present

## 2017-01-21 DIAGNOSIS — E861 Hypovolemia: Secondary | ICD-10-CM | POA: Diagnosis not present

## 2017-01-21 DIAGNOSIS — D649 Anemia, unspecified: Secondary | ICD-10-CM | POA: Diagnosis not present

## 2017-01-21 DIAGNOSIS — D899 Disorder involving the immune mechanism, unspecified: Secondary | ICD-10-CM | POA: Diagnosis not present

## 2017-01-21 DIAGNOSIS — Z7289 Other problems related to lifestyle: Secondary | ICD-10-CM | POA: Diagnosis not present

## 2017-01-21 DIAGNOSIS — R7303 Prediabetes: Secondary | ICD-10-CM | POA: Diagnosis not present

## 2017-01-21 DIAGNOSIS — E872 Acidosis: Secondary | ICD-10-CM | POA: Diagnosis not present

## 2017-01-21 DIAGNOSIS — Z87440 Personal history of urinary (tract) infections: Secondary | ICD-10-CM | POA: Diagnosis not present

## 2017-01-21 DIAGNOSIS — N2581 Secondary hyperparathyroidism of renal origin: Secondary | ICD-10-CM | POA: Diagnosis not present

## 2017-01-21 DIAGNOSIS — Z792 Long term (current) use of antibiotics: Secondary | ICD-10-CM | POA: Diagnosis not present

## 2017-01-21 DIAGNOSIS — T8619 Other complication of kidney transplant: Secondary | ICD-10-CM | POA: Diagnosis not present

## 2017-01-21 DIAGNOSIS — Z7952 Long term (current) use of systemic steroids: Secondary | ICD-10-CM | POA: Diagnosis not present

## 2017-01-24 DIAGNOSIS — N50819 Testicular pain, unspecified: Secondary | ICD-10-CM | POA: Diagnosis not present

## 2017-01-24 DIAGNOSIS — Z79899 Other long term (current) drug therapy: Secondary | ICD-10-CM | POA: Diagnosis not present

## 2017-01-24 DIAGNOSIS — D899 Disorder involving the immune mechanism, unspecified: Secondary | ICD-10-CM | POA: Diagnosis not present

## 2017-01-24 DIAGNOSIS — R7303 Prediabetes: Secondary | ICD-10-CM | POA: Diagnosis not present

## 2017-01-24 DIAGNOSIS — I951 Orthostatic hypotension: Secondary | ICD-10-CM | POA: Diagnosis not present

## 2017-01-24 DIAGNOSIS — Z5181 Encounter for therapeutic drug level monitoring: Secondary | ICD-10-CM | POA: Diagnosis not present

## 2017-01-24 DIAGNOSIS — R945 Abnormal results of liver function studies: Secondary | ICD-10-CM | POA: Diagnosis not present

## 2017-01-24 DIAGNOSIS — Z7952 Long term (current) use of systemic steroids: Secondary | ICD-10-CM | POA: Diagnosis not present

## 2017-01-24 DIAGNOSIS — Z940 Kidney transplant status: Secondary | ICD-10-CM | POA: Diagnosis not present

## 2017-01-24 DIAGNOSIS — D649 Anemia, unspecified: Secondary | ICD-10-CM | POA: Diagnosis not present

## 2017-01-24 DIAGNOSIS — T8619 Other complication of kidney transplant: Secondary | ICD-10-CM | POA: Diagnosis not present

## 2017-01-24 DIAGNOSIS — E872 Acidosis: Secondary | ICD-10-CM | POA: Diagnosis not present

## 2017-01-24 DIAGNOSIS — B259 Cytomegaloviral disease, unspecified: Secondary | ICD-10-CM | POA: Diagnosis not present

## 2017-01-24 DIAGNOSIS — E785 Hyperlipidemia, unspecified: Secondary | ICD-10-CM | POA: Diagnosis not present

## 2017-01-24 DIAGNOSIS — I10 Essential (primary) hypertension: Secondary | ICD-10-CM | POA: Diagnosis not present

## 2017-01-24 DIAGNOSIS — R7989 Other specified abnormal findings of blood chemistry: Secondary | ICD-10-CM | POA: Diagnosis not present

## 2017-01-24 DIAGNOSIS — Z87440 Personal history of urinary (tract) infections: Secondary | ICD-10-CM | POA: Diagnosis not present

## 2017-01-24 DIAGNOSIS — Z792 Long term (current) use of antibiotics: Secondary | ICD-10-CM | POA: Diagnosis not present

## 2017-01-24 DIAGNOSIS — Z4822 Encounter for aftercare following kidney transplant: Secondary | ICD-10-CM | POA: Diagnosis not present

## 2017-01-24 DIAGNOSIS — I150 Renovascular hypertension: Secondary | ICD-10-CM | POA: Diagnosis not present

## 2017-01-29 DIAGNOSIS — D649 Anemia, unspecified: Secondary | ICD-10-CM | POA: Diagnosis not present

## 2017-01-29 DIAGNOSIS — R7989 Other specified abnormal findings of blood chemistry: Secondary | ICD-10-CM | POA: Diagnosis not present

## 2017-01-29 DIAGNOSIS — N50819 Testicular pain, unspecified: Secondary | ICD-10-CM | POA: Diagnosis not present

## 2017-01-29 DIAGNOSIS — Z7952 Long term (current) use of systemic steroids: Secondary | ICD-10-CM | POA: Diagnosis not present

## 2017-01-29 DIAGNOSIS — Z792 Long term (current) use of antibiotics: Secondary | ICD-10-CM | POA: Diagnosis not present

## 2017-01-29 DIAGNOSIS — D8989 Other specified disorders involving the immune mechanism, not elsewhere classified: Secondary | ICD-10-CM | POA: Diagnosis not present

## 2017-01-29 DIAGNOSIS — E872 Acidosis: Secondary | ICD-10-CM | POA: Diagnosis not present

## 2017-01-29 DIAGNOSIS — E869 Volume depletion, unspecified: Secondary | ICD-10-CM | POA: Diagnosis not present

## 2017-01-29 DIAGNOSIS — Z79899 Other long term (current) drug therapy: Secondary | ICD-10-CM | POA: Diagnosis not present

## 2017-01-29 DIAGNOSIS — Z87440 Personal history of urinary (tract) infections: Secondary | ICD-10-CM | POA: Diagnosis not present

## 2017-01-29 DIAGNOSIS — R945 Abnormal results of liver function studies: Secondary | ICD-10-CM | POA: Diagnosis not present

## 2017-01-29 DIAGNOSIS — R7303 Prediabetes: Secondary | ICD-10-CM | POA: Diagnosis not present

## 2017-01-29 DIAGNOSIS — I10 Essential (primary) hypertension: Secondary | ICD-10-CM | POA: Diagnosis not present

## 2017-01-29 DIAGNOSIS — B259 Cytomegaloviral disease, unspecified: Secondary | ICD-10-CM | POA: Insufficient documentation

## 2017-01-29 DIAGNOSIS — E785 Hyperlipidemia, unspecified: Secondary | ICD-10-CM | POA: Diagnosis not present

## 2017-01-29 DIAGNOSIS — Z4822 Encounter for aftercare following kidney transplant: Secondary | ICD-10-CM | POA: Diagnosis not present

## 2017-01-29 DIAGNOSIS — Z5181 Encounter for therapeutic drug level monitoring: Secondary | ICD-10-CM | POA: Diagnosis not present

## 2017-01-29 DIAGNOSIS — N261 Atrophy of kidney (terminal): Secondary | ICD-10-CM | POA: Diagnosis not present

## 2017-01-29 DIAGNOSIS — Z940 Kidney transplant status: Secondary | ICD-10-CM | POA: Diagnosis not present

## 2017-01-29 DIAGNOSIS — N159 Renal tubulo-interstitial disease, unspecified: Secondary | ICD-10-CM | POA: Diagnosis not present

## 2017-01-29 DIAGNOSIS — Z7983 Long term (current) use of bisphosphonates: Secondary | ICD-10-CM | POA: Diagnosis not present

## 2017-02-05 DIAGNOSIS — B258 Other cytomegaloviral diseases: Secondary | ICD-10-CM | POA: Diagnosis not present

## 2017-02-05 DIAGNOSIS — R7303 Prediabetes: Secondary | ICD-10-CM | POA: Diagnosis not present

## 2017-02-05 DIAGNOSIS — I10 Essential (primary) hypertension: Secondary | ICD-10-CM | POA: Diagnosis not present

## 2017-02-05 DIAGNOSIS — E872 Acidosis: Secondary | ICD-10-CM | POA: Diagnosis not present

## 2017-02-05 DIAGNOSIS — E785 Hyperlipidemia, unspecified: Secondary | ICD-10-CM | POA: Diagnosis not present

## 2017-02-05 DIAGNOSIS — D8989 Other specified disorders involving the immune mechanism, not elsewhere classified: Secondary | ICD-10-CM | POA: Diagnosis not present

## 2017-02-05 DIAGNOSIS — Z79899 Other long term (current) drug therapy: Secondary | ICD-10-CM | POA: Diagnosis not present

## 2017-02-05 DIAGNOSIS — Z940 Kidney transplant status: Secondary | ICD-10-CM | POA: Diagnosis not present

## 2017-02-05 DIAGNOSIS — D649 Anemia, unspecified: Secondary | ICD-10-CM | POA: Diagnosis not present

## 2017-02-05 DIAGNOSIS — N50819 Testicular pain, unspecified: Secondary | ICD-10-CM | POA: Diagnosis not present

## 2017-02-06 ENCOUNTER — Encounter: Payer: Medicare Other | Admitting: Internal Medicine

## 2017-02-06 ENCOUNTER — Encounter: Payer: Self-pay | Admitting: Internal Medicine

## 2017-02-12 DIAGNOSIS — E869 Volume depletion, unspecified: Secondary | ICD-10-CM | POA: Diagnosis not present

## 2017-02-12 DIAGNOSIS — E878 Other disorders of electrolyte and fluid balance, not elsewhere classified: Secondary | ICD-10-CM | POA: Diagnosis not present

## 2017-02-12 DIAGNOSIS — K649 Unspecified hemorrhoids: Secondary | ICD-10-CM | POA: Diagnosis not present

## 2017-02-12 DIAGNOSIS — Z4822 Encounter for aftercare following kidney transplant: Secondary | ICD-10-CM | POA: Diagnosis not present

## 2017-02-12 DIAGNOSIS — E872 Acidosis: Secondary | ICD-10-CM | POA: Diagnosis not present

## 2017-02-12 DIAGNOSIS — B259 Cytomegaloviral disease, unspecified: Secondary | ICD-10-CM | POA: Diagnosis not present

## 2017-02-12 DIAGNOSIS — D8989 Other specified disorders involving the immune mechanism, not elsewhere classified: Secondary | ICD-10-CM | POA: Diagnosis not present

## 2017-02-12 DIAGNOSIS — E785 Hyperlipidemia, unspecified: Secondary | ICD-10-CM | POA: Diagnosis not present

## 2017-02-12 DIAGNOSIS — Z792 Long term (current) use of antibiotics: Secondary | ICD-10-CM | POA: Diagnosis not present

## 2017-02-12 DIAGNOSIS — D649 Anemia, unspecified: Secondary | ICD-10-CM | POA: Diagnosis not present

## 2017-02-12 DIAGNOSIS — B258 Other cytomegaloviral diseases: Secondary | ICD-10-CM | POA: Diagnosis not present

## 2017-02-12 DIAGNOSIS — N50819 Testicular pain, unspecified: Secondary | ICD-10-CM | POA: Diagnosis not present

## 2017-02-12 DIAGNOSIS — I10 Essential (primary) hypertension: Secondary | ICD-10-CM | POA: Diagnosis not present

## 2017-02-12 DIAGNOSIS — Z79899 Other long term (current) drug therapy: Secondary | ICD-10-CM | POA: Diagnosis not present

## 2017-02-12 DIAGNOSIS — Z940 Kidney transplant status: Secondary | ICD-10-CM | POA: Diagnosis not present

## 2017-02-12 DIAGNOSIS — R945 Abnormal results of liver function studies: Secondary | ICD-10-CM | POA: Diagnosis not present

## 2017-02-12 DIAGNOSIS — Z7952 Long term (current) use of systemic steroids: Secondary | ICD-10-CM | POA: Diagnosis not present

## 2017-02-12 DIAGNOSIS — R7303 Prediabetes: Secondary | ICD-10-CM | POA: Diagnosis not present

## 2017-02-12 DIAGNOSIS — Z5181 Encounter for therapeutic drug level monitoring: Secondary | ICD-10-CM | POA: Diagnosis not present

## 2017-02-14 DIAGNOSIS — Z940 Kidney transplant status: Secondary | ICD-10-CM | POA: Diagnosis not present

## 2017-02-28 DIAGNOSIS — D899 Disorder involving the immune mechanism, unspecified: Secondary | ICD-10-CM | POA: Diagnosis not present

## 2017-02-28 DIAGNOSIS — N186 End stage renal disease: Secondary | ICD-10-CM | POA: Diagnosis not present

## 2017-02-28 DIAGNOSIS — E872 Acidosis: Secondary | ICD-10-CM | POA: Diagnosis not present

## 2017-02-28 DIAGNOSIS — I120 Hypertensive chronic kidney disease with stage 5 chronic kidney disease or end stage renal disease: Secondary | ICD-10-CM | POA: Diagnosis not present

## 2017-02-28 DIAGNOSIS — Z79899 Other long term (current) drug therapy: Secondary | ICD-10-CM | POA: Diagnosis not present

## 2017-02-28 DIAGNOSIS — Z7952 Long term (current) use of systemic steroids: Secondary | ICD-10-CM | POA: Diagnosis not present

## 2017-02-28 DIAGNOSIS — E785 Hyperlipidemia, unspecified: Secondary | ICD-10-CM | POA: Diagnosis not present

## 2017-02-28 DIAGNOSIS — B258 Other cytomegaloviral diseases: Secondary | ICD-10-CM | POA: Diagnosis not present

## 2017-02-28 DIAGNOSIS — I10 Essential (primary) hypertension: Secondary | ICD-10-CM | POA: Diagnosis not present

## 2017-02-28 DIAGNOSIS — Z01818 Encounter for other preprocedural examination: Secondary | ICD-10-CM | POA: Diagnosis not present

## 2017-02-28 DIAGNOSIS — D631 Anemia in chronic kidney disease: Secondary | ICD-10-CM | POA: Diagnosis not present

## 2017-02-28 DIAGNOSIS — T8619 Other complication of kidney transplant: Secondary | ICD-10-CM | POA: Diagnosis not present

## 2017-02-28 DIAGNOSIS — Z87440 Personal history of urinary (tract) infections: Secondary | ICD-10-CM | POA: Diagnosis not present

## 2017-02-28 DIAGNOSIS — R7303 Prediabetes: Secondary | ICD-10-CM | POA: Diagnosis not present

## 2017-02-28 DIAGNOSIS — D72819 Decreased white blood cell count, unspecified: Secondary | ICD-10-CM | POA: Diagnosis not present

## 2017-02-28 DIAGNOSIS — B259 Cytomegaloviral disease, unspecified: Secondary | ICD-10-CM | POA: Diagnosis not present

## 2017-02-28 DIAGNOSIS — R7989 Other specified abnormal findings of blood chemistry: Secondary | ICD-10-CM | POA: Diagnosis not present

## 2017-02-28 DIAGNOSIS — Z4822 Encounter for aftercare following kidney transplant: Secondary | ICD-10-CM | POA: Diagnosis not present

## 2017-02-28 DIAGNOSIS — Z87438 Personal history of other diseases of male genital organs: Secondary | ICD-10-CM | POA: Diagnosis not present

## 2017-02-28 DIAGNOSIS — K649 Unspecified hemorrhoids: Secondary | ICD-10-CM | POA: Diagnosis not present

## 2017-03-05 DIAGNOSIS — Z940 Kidney transplant status: Secondary | ICD-10-CM | POA: Diagnosis not present

## 2017-03-14 DIAGNOSIS — Z940 Kidney transplant status: Secondary | ICD-10-CM | POA: Diagnosis not present

## 2017-03-14 DIAGNOSIS — Z792 Long term (current) use of antibiotics: Secondary | ICD-10-CM | POA: Diagnosis not present

## 2017-03-14 DIAGNOSIS — Z5181 Encounter for therapeutic drug level monitoring: Secondary | ICD-10-CM | POA: Diagnosis not present

## 2017-03-14 DIAGNOSIS — D649 Anemia, unspecified: Secondary | ICD-10-CM | POA: Diagnosis not present

## 2017-03-14 DIAGNOSIS — D8989 Other specified disorders involving the immune mechanism, not elsewhere classified: Secondary | ICD-10-CM | POA: Diagnosis not present

## 2017-03-14 DIAGNOSIS — B258 Other cytomegaloviral diseases: Secondary | ICD-10-CM | POA: Diagnosis not present

## 2017-03-14 DIAGNOSIS — Z79899 Other long term (current) drug therapy: Secondary | ICD-10-CM | POA: Diagnosis not present

## 2017-03-14 DIAGNOSIS — E872 Acidosis: Secondary | ICD-10-CM | POA: Diagnosis not present

## 2017-03-14 DIAGNOSIS — E785 Hyperlipidemia, unspecified: Secondary | ICD-10-CM | POA: Diagnosis not present

## 2017-03-14 DIAGNOSIS — K649 Unspecified hemorrhoids: Secondary | ICD-10-CM | POA: Diagnosis not present

## 2017-03-14 DIAGNOSIS — Z7952 Long term (current) use of systemic steroids: Secondary | ICD-10-CM | POA: Diagnosis not present

## 2017-03-14 DIAGNOSIS — R7303 Prediabetes: Secondary | ICD-10-CM | POA: Diagnosis not present

## 2017-03-14 DIAGNOSIS — I10 Essential (primary) hypertension: Secondary | ICD-10-CM | POA: Diagnosis not present

## 2017-03-14 DIAGNOSIS — D72819 Decreased white blood cell count, unspecified: Secondary | ICD-10-CM | POA: Diagnosis not present

## 2017-03-14 DIAGNOSIS — Z4822 Encounter for aftercare following kidney transplant: Secondary | ICD-10-CM | POA: Diagnosis not present

## 2017-03-25 DIAGNOSIS — N4889 Other specified disorders of penis: Secondary | ICD-10-CM | POA: Diagnosis not present

## 2017-03-25 DIAGNOSIS — Z940 Kidney transplant status: Secondary | ICD-10-CM | POA: Diagnosis not present

## 2017-04-16 DIAGNOSIS — R945 Abnormal results of liver function studies: Secondary | ICD-10-CM | POA: Diagnosis not present

## 2017-04-16 DIAGNOSIS — Z792 Long term (current) use of antibiotics: Secondary | ICD-10-CM | POA: Diagnosis not present

## 2017-04-16 DIAGNOSIS — E872 Acidosis: Secondary | ICD-10-CM | POA: Diagnosis not present

## 2017-04-16 DIAGNOSIS — Z4822 Encounter for aftercare following kidney transplant: Secondary | ICD-10-CM | POA: Diagnosis not present

## 2017-04-16 DIAGNOSIS — Z7952 Long term (current) use of systemic steroids: Secondary | ICD-10-CM | POA: Diagnosis not present

## 2017-04-16 DIAGNOSIS — D649 Anemia, unspecified: Secondary | ICD-10-CM | POA: Diagnosis not present

## 2017-04-16 DIAGNOSIS — K649 Unspecified hemorrhoids: Secondary | ICD-10-CM | POA: Diagnosis not present

## 2017-04-16 DIAGNOSIS — E875 Hyperkalemia: Secondary | ICD-10-CM | POA: Diagnosis not present

## 2017-04-16 DIAGNOSIS — I10 Essential (primary) hypertension: Secondary | ICD-10-CM | POA: Diagnosis not present

## 2017-04-16 DIAGNOSIS — N50819 Testicular pain, unspecified: Secondary | ICD-10-CM | POA: Diagnosis not present

## 2017-04-16 DIAGNOSIS — Z87891 Personal history of nicotine dependence: Secondary | ICD-10-CM | POA: Diagnosis not present

## 2017-04-16 DIAGNOSIS — Z5181 Encounter for therapeutic drug level monitoring: Secondary | ICD-10-CM | POA: Diagnosis not present

## 2017-04-16 DIAGNOSIS — E785 Hyperlipidemia, unspecified: Secondary | ICD-10-CM | POA: Diagnosis not present

## 2017-04-16 DIAGNOSIS — B258 Other cytomegaloviral diseases: Secondary | ICD-10-CM | POA: Diagnosis not present

## 2017-04-16 DIAGNOSIS — Z940 Kidney transplant status: Secondary | ICD-10-CM | POA: Diagnosis not present

## 2017-04-16 DIAGNOSIS — D899 Disorder involving the immune mechanism, unspecified: Secondary | ICD-10-CM | POA: Diagnosis not present

## 2017-04-16 DIAGNOSIS — R7303 Prediabetes: Secondary | ICD-10-CM | POA: Diagnosis not present

## 2017-04-16 DIAGNOSIS — Z79899 Other long term (current) drug therapy: Secondary | ICD-10-CM | POA: Diagnosis not present

## 2017-05-16 DIAGNOSIS — R7989 Other specified abnormal findings of blood chemistry: Secondary | ICD-10-CM | POA: Diagnosis not present

## 2017-05-16 DIAGNOSIS — K649 Unspecified hemorrhoids: Secondary | ICD-10-CM | POA: Diagnosis not present

## 2017-05-16 DIAGNOSIS — E872 Acidosis: Secondary | ICD-10-CM | POA: Diagnosis not present

## 2017-05-16 DIAGNOSIS — D72819 Decreased white blood cell count, unspecified: Secondary | ICD-10-CM | POA: Diagnosis not present

## 2017-05-16 DIAGNOSIS — Z5181 Encounter for therapeutic drug level monitoring: Secondary | ICD-10-CM | POA: Diagnosis not present

## 2017-05-16 DIAGNOSIS — Z4822 Encounter for aftercare following kidney transplant: Secondary | ICD-10-CM | POA: Diagnosis not present

## 2017-05-16 DIAGNOSIS — Z7952 Long term (current) use of systemic steroids: Secondary | ICD-10-CM | POA: Diagnosis not present

## 2017-05-16 DIAGNOSIS — Z940 Kidney transplant status: Secondary | ICD-10-CM | POA: Diagnosis not present

## 2017-05-16 DIAGNOSIS — D649 Anemia, unspecified: Secondary | ICD-10-CM | POA: Diagnosis not present

## 2017-05-16 DIAGNOSIS — I10 Essential (primary) hypertension: Secondary | ICD-10-CM | POA: Diagnosis not present

## 2017-05-16 DIAGNOSIS — T8619 Other complication of kidney transplant: Secondary | ICD-10-CM | POA: Diagnosis not present

## 2017-05-16 DIAGNOSIS — Z79899 Other long term (current) drug therapy: Secondary | ICD-10-CM | POA: Diagnosis not present

## 2017-05-16 DIAGNOSIS — Z792 Long term (current) use of antibiotics: Secondary | ICD-10-CM | POA: Diagnosis not present

## 2017-05-16 DIAGNOSIS — Z7982 Long term (current) use of aspirin: Secondary | ICD-10-CM | POA: Diagnosis not present

## 2017-05-16 DIAGNOSIS — R7303 Prediabetes: Secondary | ICD-10-CM | POA: Diagnosis not present

## 2017-05-16 DIAGNOSIS — E785 Hyperlipidemia, unspecified: Secondary | ICD-10-CM | POA: Diagnosis not present

## 2017-06-14 DIAGNOSIS — Z4822 Encounter for aftercare following kidney transplant: Secondary | ICD-10-CM | POA: Diagnosis not present

## 2017-06-14 DIAGNOSIS — K644 Residual hemorrhoidal skin tags: Secondary | ICD-10-CM | POA: Diagnosis not present

## 2017-06-14 DIAGNOSIS — E872 Acidosis: Secondary | ICD-10-CM | POA: Diagnosis not present

## 2017-06-14 DIAGNOSIS — M109 Gout, unspecified: Secondary | ICD-10-CM | POA: Diagnosis not present

## 2017-06-14 DIAGNOSIS — Z7952 Long term (current) use of systemic steroids: Secondary | ICD-10-CM | POA: Diagnosis not present

## 2017-06-14 DIAGNOSIS — B258 Other cytomegaloviral diseases: Secondary | ICD-10-CM | POA: Diagnosis not present

## 2017-06-14 DIAGNOSIS — D8989 Other specified disorders involving the immune mechanism, not elsewhere classified: Secondary | ICD-10-CM | POA: Diagnosis not present

## 2017-06-14 DIAGNOSIS — Z79899 Other long term (current) drug therapy: Secondary | ICD-10-CM | POA: Diagnosis not present

## 2017-06-14 DIAGNOSIS — R7303 Prediabetes: Secondary | ICD-10-CM | POA: Diagnosis not present

## 2017-06-14 DIAGNOSIS — Z940 Kidney transplant status: Secondary | ICD-10-CM | POA: Diagnosis not present

## 2017-06-14 DIAGNOSIS — D649 Anemia, unspecified: Secondary | ICD-10-CM | POA: Diagnosis not present

## 2017-06-14 DIAGNOSIS — B349 Viral infection, unspecified: Secondary | ICD-10-CM | POA: Diagnosis not present

## 2017-06-14 DIAGNOSIS — E785 Hyperlipidemia, unspecified: Secondary | ICD-10-CM | POA: Diagnosis not present

## 2017-06-14 DIAGNOSIS — K649 Unspecified hemorrhoids: Secondary | ICD-10-CM | POA: Diagnosis not present

## 2017-06-14 DIAGNOSIS — I10 Essential (primary) hypertension: Secondary | ICD-10-CM | POA: Diagnosis not present

## 2017-06-14 DIAGNOSIS — Z792 Long term (current) use of antibiotics: Secondary | ICD-10-CM | POA: Diagnosis not present

## 2017-07-19 DIAGNOSIS — I10 Essential (primary) hypertension: Secondary | ICD-10-CM | POA: Diagnosis not present

## 2017-07-19 DIAGNOSIS — B259 Cytomegaloviral disease, unspecified: Secondary | ICD-10-CM | POA: Diagnosis not present

## 2017-07-19 DIAGNOSIS — E785 Hyperlipidemia, unspecified: Secondary | ICD-10-CM | POA: Diagnosis not present

## 2017-07-19 DIAGNOSIS — Z7952 Long term (current) use of systemic steroids: Secondary | ICD-10-CM | POA: Diagnosis not present

## 2017-07-19 DIAGNOSIS — D649 Anemia, unspecified: Secondary | ICD-10-CM | POA: Diagnosis not present

## 2017-07-19 DIAGNOSIS — K649 Unspecified hemorrhoids: Secondary | ICD-10-CM | POA: Diagnosis not present

## 2017-07-19 DIAGNOSIS — D8989 Other specified disorders involving the immune mechanism, not elsewhere classified: Secondary | ICD-10-CM | POA: Diagnosis not present

## 2017-07-19 DIAGNOSIS — Z792 Long term (current) use of antibiotics: Secondary | ICD-10-CM | POA: Diagnosis not present

## 2017-07-19 DIAGNOSIS — Z4822 Encounter for aftercare following kidney transplant: Secondary | ICD-10-CM | POA: Diagnosis not present

## 2017-07-19 DIAGNOSIS — Z79899 Other long term (current) drug therapy: Secondary | ICD-10-CM | POA: Diagnosis not present

## 2017-07-19 DIAGNOSIS — R945 Abnormal results of liver function studies: Secondary | ICD-10-CM | POA: Diagnosis not present

## 2017-07-19 DIAGNOSIS — E869 Volume depletion, unspecified: Secondary | ICD-10-CM | POA: Diagnosis not present

## 2017-07-19 DIAGNOSIS — I120 Hypertensive chronic kidney disease with stage 5 chronic kidney disease or end stage renal disease: Secondary | ICD-10-CM | POA: Diagnosis not present

## 2017-07-19 DIAGNOSIS — Z940 Kidney transplant status: Secondary | ICD-10-CM | POA: Diagnosis not present

## 2017-07-19 DIAGNOSIS — M109 Gout, unspecified: Secondary | ICD-10-CM | POA: Diagnosis not present

## 2017-07-19 DIAGNOSIS — R7303 Prediabetes: Secondary | ICD-10-CM | POA: Diagnosis not present

## 2017-08-08 DIAGNOSIS — Z5181 Encounter for therapeutic drug level monitoring: Secondary | ICD-10-CM | POA: Diagnosis not present

## 2017-08-08 DIAGNOSIS — D899 Disorder involving the immune mechanism, unspecified: Secondary | ICD-10-CM | POA: Diagnosis not present

## 2017-08-08 DIAGNOSIS — E7439 Other disorders of intestinal carbohydrate absorption: Secondary | ICD-10-CM | POA: Diagnosis not present

## 2017-08-08 DIAGNOSIS — E785 Hyperlipidemia, unspecified: Secondary | ICD-10-CM | POA: Diagnosis not present

## 2017-08-08 DIAGNOSIS — Z4822 Encounter for aftercare following kidney transplant: Secondary | ICD-10-CM | POA: Diagnosis not present

## 2017-08-08 DIAGNOSIS — Z7289 Other problems related to lifestyle: Secondary | ICD-10-CM | POA: Diagnosis not present

## 2017-08-08 DIAGNOSIS — K649 Unspecified hemorrhoids: Secondary | ICD-10-CM | POA: Diagnosis not present

## 2017-08-08 DIAGNOSIS — Z79899 Other long term (current) drug therapy: Secondary | ICD-10-CM | POA: Diagnosis not present

## 2017-08-08 DIAGNOSIS — Z940 Kidney transplant status: Secondary | ICD-10-CM | POA: Diagnosis not present

## 2017-08-08 DIAGNOSIS — I10 Essential (primary) hypertension: Secondary | ICD-10-CM | POA: Diagnosis not present

## 2017-08-08 DIAGNOSIS — Z01818 Encounter for other preprocedural examination: Secondary | ICD-10-CM | POA: Diagnosis not present

## 2017-09-28 ENCOUNTER — Encounter (HOSPITAL_COMMUNITY): Payer: Self-pay | Admitting: Emergency Medicine

## 2017-09-28 ENCOUNTER — Emergency Department (HOSPITAL_COMMUNITY): Payer: Medicare Other

## 2017-09-28 ENCOUNTER — Emergency Department (HOSPITAL_COMMUNITY)
Admission: EM | Admit: 2017-09-28 | Discharge: 2017-09-28 | Disposition: A | Discharge status: Home / Self Care | Payer: Medicare Other | Attending: Emergency Medicine | Admitting: Emergency Medicine

## 2017-09-28 DIAGNOSIS — Y9389 Activity, other specified: Secondary | ICD-10-CM | POA: Insufficient documentation

## 2017-09-28 DIAGNOSIS — S81801A Unspecified open wound, right lower leg, initial encounter: Secondary | ICD-10-CM | POA: Insufficient documentation

## 2017-09-28 DIAGNOSIS — Y929 Unspecified place or not applicable: Secondary | ICD-10-CM | POA: Diagnosis not present

## 2017-09-28 DIAGNOSIS — S8991XA Unspecified injury of right lower leg, initial encounter: Secondary | ICD-10-CM | POA: Diagnosis present

## 2017-09-28 DIAGNOSIS — Y998 Other external cause status: Secondary | ICD-10-CM | POA: Insufficient documentation

## 2017-09-28 DIAGNOSIS — I120 Hypertensive chronic kidney disease with stage 5 chronic kidney disease or end stage renal disease: Secondary | ICD-10-CM | POA: Insufficient documentation

## 2017-09-28 DIAGNOSIS — R58 Hemorrhage, not elsewhere classified: Secondary | ICD-10-CM | POA: Diagnosis not present

## 2017-09-28 DIAGNOSIS — F17210 Nicotine dependence, cigarettes, uncomplicated: Secondary | ICD-10-CM | POA: Insufficient documentation

## 2017-09-28 DIAGNOSIS — Z79899 Other long term (current) drug therapy: Secondary | ICD-10-CM | POA: Insufficient documentation

## 2017-09-28 DIAGNOSIS — W3400XA Accidental discharge from unspecified firearms or gun, initial encounter: Secondary | ICD-10-CM | POA: Diagnosis not present

## 2017-09-28 DIAGNOSIS — N186 End stage renal disease: Secondary | ICD-10-CM | POA: Diagnosis not present

## 2017-09-28 DIAGNOSIS — R52 Pain, unspecified: Secondary | ICD-10-CM | POA: Diagnosis not present

## 2017-09-28 DIAGNOSIS — S81802A Unspecified open wound, left lower leg, initial encounter: Secondary | ICD-10-CM | POA: Diagnosis not present

## 2017-09-28 DIAGNOSIS — I10 Essential (primary) hypertension: Secondary | ICD-10-CM | POA: Diagnosis not present

## 2017-09-28 MED ORDER — MORPHINE SULFATE (PF) 4 MG/ML IV SOLN
4.0000 mg | Freq: Once | INTRAVENOUS | Status: AC
  Administered 2017-09-28: 4 mg via INTRAVENOUS
  Filled 2017-09-28: qty 1

## 2017-09-28 MED ORDER — FENTANYL CITRATE (PF) 100 MCG/2ML IJ SOLN
50.0000 ug | Freq: Once | INTRAMUSCULAR | Status: AC
  Administered 2017-09-28: 50 ug via INTRAVENOUS
  Filled 2017-09-28: qty 2

## 2017-09-28 MED ORDER — ONDANSETRON HCL 4 MG/2ML IJ SOLN
4.0000 mg | Freq: Once | INTRAMUSCULAR | Status: AC
  Administered 2017-09-28: 4 mg via INTRAVENOUS
  Filled 2017-09-28: qty 2
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
## 2017-09-28 NOTE — ED Notes (Signed)
Patient verbalizes understanding of medications and discharge instructions. No further questions at this time. VSS and patient ambulatory at discharge.   

## 2017-10-03 ENCOUNTER — Ambulatory Visit (HOSPITAL_COMMUNITY)
Admission: EM | Admit: 2017-10-03 | Discharge: 2017-10-03 | Disposition: A | Discharge status: Home / Self Care | Payer: Medicare Other | Attending: Family Medicine | Admitting: Family Medicine

## 2017-10-03 DIAGNOSIS — R2241 Localized swelling, mass and lump, right lower limb: Secondary | ICD-10-CM

## 2017-10-03 DIAGNOSIS — W3400XA Accidental discharge from unspecified firearms or gun, initial encounter: Secondary | ICD-10-CM

## 2017-10-03 DIAGNOSIS — Z23 Encounter for immunization: Secondary | ICD-10-CM

## 2017-10-03 DIAGNOSIS — S81801A Unspecified open wound, right lower leg, initial encounter: Secondary | ICD-10-CM | POA: Diagnosis not present

## 2017-10-03 MED ORDER — MUPIROCIN 2 % EX OINT: 1.0000 "application " | TOPICAL_OINTMENT | Freq: Three times a day (TID) | CUTANEOUS | 1 refills | Status: DC

## 2017-10-03 MED ORDER — HYDROCODONE-ACETAMINOPHEN 5-325 MG PO TABS: 1.0000 | ORAL_TABLET | Freq: Four times a day (QID) | ORAL | 0 refills | Status: DC | PRN

## 2017-10-03 MED ORDER — TETANUS-DIPHTH-ACELL PERTUSSIS 5-2.5-18.5 LF-MCG/0.5 IM SUSP
INTRAMUSCULAR | Status: AC
  Filled 2017-10-03: qty 0.5

## 2017-10-03 MED ORDER — TETANUS-DIPHTH-ACELL PERTUSSIS 5-2.5-18.5 LF-MCG/0.5 IM SUSP
0.5000 mL | Freq: Once | INTRAMUSCULAR | Status: AC
  Administered 2017-10-03: 0.5 mL via INTRAMUSCULAR

## 2017-10-03 MED ORDER — DOXYCYCLINE HYCLATE 100 MG PO TABS: 100.0000 mg | ORAL_TABLET | Freq: Two times a day (BID) | ORAL | 0 refills | Status: DC
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
## 2017-10-11 ENCOUNTER — Encounter (HOSPITAL_COMMUNITY): Payer: Self-pay

## 2017-10-11 ENCOUNTER — Ambulatory Visit (HOSPITAL_COMMUNITY)
Admission: EM | Admit: 2017-10-11 | Discharge: 2017-10-11 | Disposition: A | Discharge status: Home / Self Care | Payer: Medicare Other | Attending: Family Medicine | Admitting: Family Medicine

## 2017-10-11 DIAGNOSIS — R2241 Localized swelling, mass and lump, right lower limb: Secondary | ICD-10-CM

## 2017-10-11 DIAGNOSIS — R600 Localized edema: Secondary | ICD-10-CM | POA: Diagnosis not present

## 2017-10-11 DIAGNOSIS — S81801D Unspecified open wound, right lower leg, subsequent encounter: Secondary | ICD-10-CM | POA: Diagnosis not present

## 2017-10-11 DIAGNOSIS — Z5189 Encounter for other specified aftercare: Secondary | ICD-10-CM

## 2017-10-11 MED ORDER — HYDROCODONE-ACETAMINOPHEN 5-325 MG PO TABS: 1.0000 | ORAL_TABLET | Freq: Every evening | ORAL | 0 refills | Status: DC | PRN
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
## 2017-10-11 NOTE — Discharge Instructions (Signed)
Continue changing the dressing every day.  You may walk and exercise now
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
## 2017-10-29 ENCOUNTER — Encounter (HOSPITAL_COMMUNITY): Payer: Self-pay | Admitting: Emergency Medicine

## 2017-10-29 ENCOUNTER — Ambulatory Visit (HOSPITAL_COMMUNITY)
Admission: EM | Admit: 2017-10-29 | Discharge: 2017-10-29 | Disposition: A | Discharge status: Home / Self Care | Payer: Medicare Other | Attending: Family Medicine | Admitting: Family Medicine

## 2017-10-29 DIAGNOSIS — M79604 Pain in right leg: Secondary | ICD-10-CM | POA: Diagnosis not present

## 2017-10-29 MED ORDER — GABAPENTIN 300 MG PO CAPS: 300.0000 mg | ORAL_CAPSULE | Freq: Every day | ORAL | 1 refills | Status: DC
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
## 2017-10-29 NOTE — Discharge Instructions (Signed)
The wound is healing Keep dressing the wound as you have been. Make sure its not too tight.  We will start gabapentin, you can take this daily at bedtime.  Follow up with your primary care for further management.

## 2017-11-22 DIAGNOSIS — Z79899 Other long term (current) drug therapy: Secondary | ICD-10-CM | POA: Diagnosis not present

## 2017-11-22 DIAGNOSIS — Z720 Tobacco use: Secondary | ICD-10-CM | POA: Diagnosis not present

## 2017-11-22 DIAGNOSIS — R7303 Prediabetes: Secondary | ICD-10-CM | POA: Diagnosis not present

## 2017-11-22 DIAGNOSIS — M109 Gout, unspecified: Secondary | ICD-10-CM | POA: Diagnosis not present

## 2017-11-22 DIAGNOSIS — Z940 Kidney transplant status: Secondary | ICD-10-CM | POA: Diagnosis not present

## 2017-11-22 DIAGNOSIS — I129 Hypertensive chronic kidney disease with stage 1 through stage 4 chronic kidney disease, or unspecified chronic kidney disease: Secondary | ICD-10-CM | POA: Diagnosis not present

## 2017-11-25 DIAGNOSIS — Z940 Kidney transplant status: Secondary | ICD-10-CM | POA: Diagnosis not present

## 2017-11-25 DIAGNOSIS — M109 Gout, unspecified: Secondary | ICD-10-CM | POA: Diagnosis not present

## 2017-11-25 DIAGNOSIS — R7303 Prediabetes: Secondary | ICD-10-CM | POA: Diagnosis not present

## 2017-11-25 DIAGNOSIS — I129 Hypertensive chronic kidney disease with stage 1 through stage 4 chronic kidney disease, or unspecified chronic kidney disease: Secondary | ICD-10-CM | POA: Diagnosis not present

## 2018-01-24 DIAGNOSIS — I129 Hypertensive chronic kidney disease with stage 1 through stage 4 chronic kidney disease, or unspecified chronic kidney disease: Secondary | ICD-10-CM | POA: Diagnosis not present

## 2018-01-24 DIAGNOSIS — Z940 Kidney transplant status: Secondary | ICD-10-CM | POA: Diagnosis not present

## 2018-01-24 DIAGNOSIS — M109 Gout, unspecified: Secondary | ICD-10-CM | POA: Diagnosis not present

## 2018-01-29 DIAGNOSIS — Z720 Tobacco use: Secondary | ICD-10-CM | POA: Diagnosis not present

## 2018-01-29 DIAGNOSIS — R7303 Prediabetes: Secondary | ICD-10-CM | POA: Diagnosis not present

## 2018-01-29 DIAGNOSIS — M109 Gout, unspecified: Secondary | ICD-10-CM | POA: Diagnosis not present

## 2018-01-29 DIAGNOSIS — I129 Hypertensive chronic kidney disease with stage 1 through stage 4 chronic kidney disease, or unspecified chronic kidney disease: Secondary | ICD-10-CM | POA: Diagnosis not present

## 2018-01-29 DIAGNOSIS — Z79899 Other long term (current) drug therapy: Secondary | ICD-10-CM | POA: Diagnosis not present

## 2018-01-29 DIAGNOSIS — Z940 Kidney transplant status: Secondary | ICD-10-CM | POA: Diagnosis not present

## 2018-02-08 IMAGING — CR DG CHEST 2V
3 series · 3 of 3 positions shown · non-contrast
Comparison: 06/20/2012 and earlier.

CLINICAL DATA: 59-year-old male dialysis patient. Transplant
evaluation. Initial encounter.

EXAM:
CHEST  2 VIEW

[w chest pa]
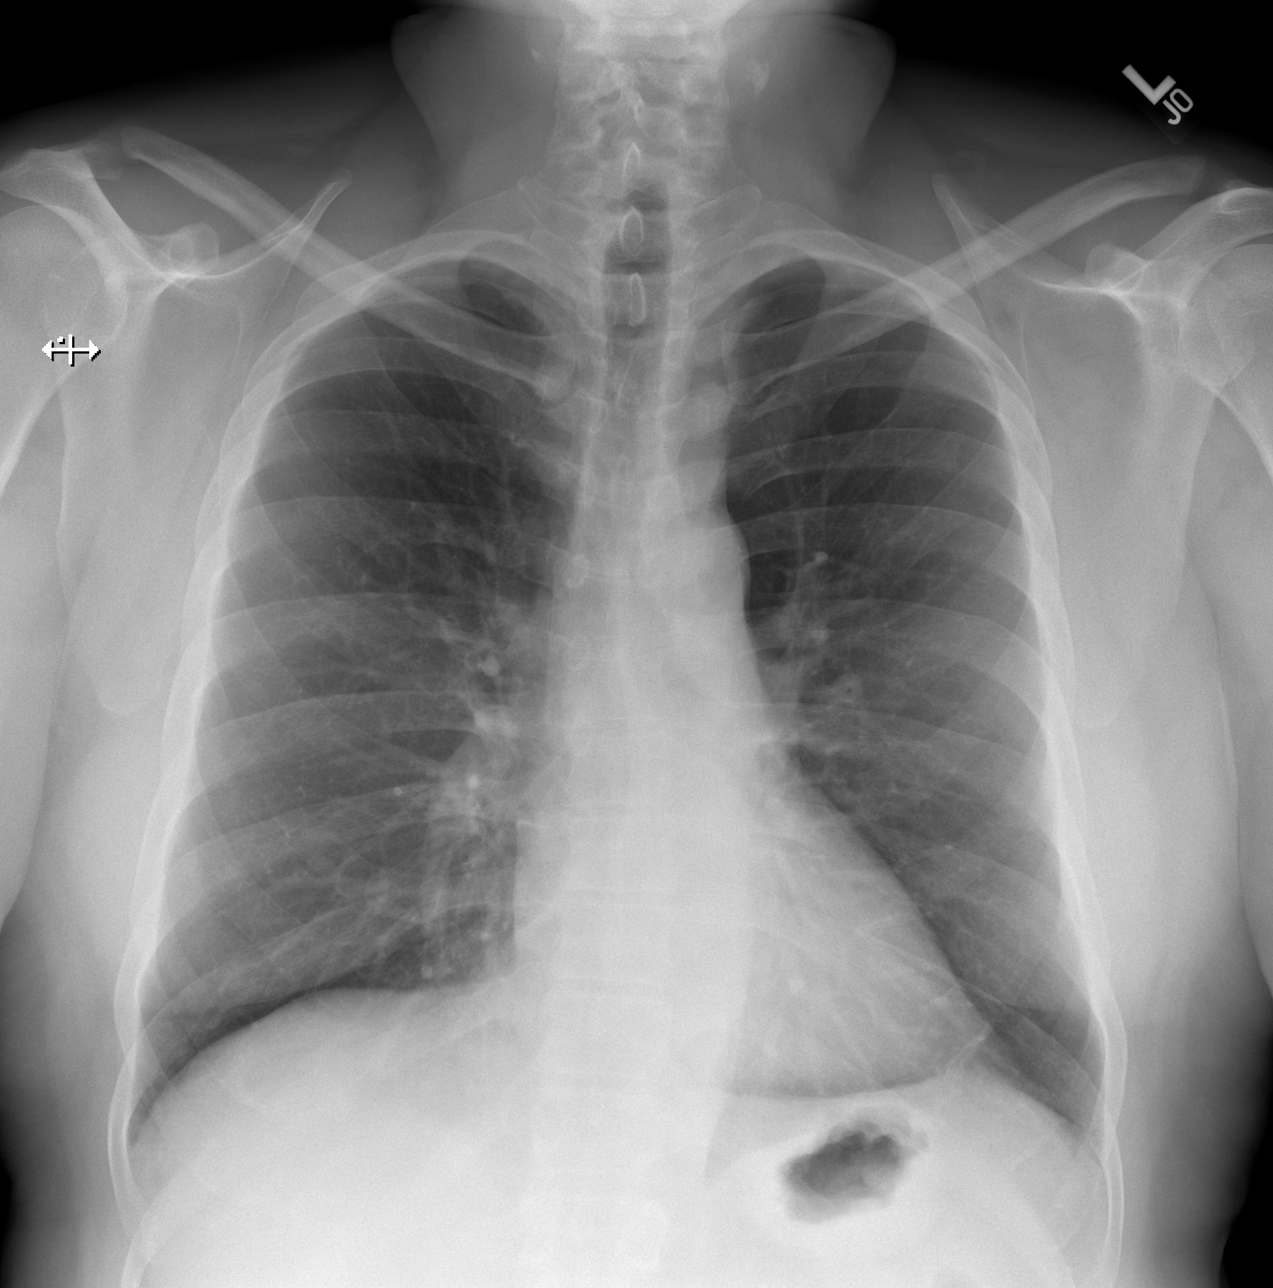

[w chest lat (1 of 2)]
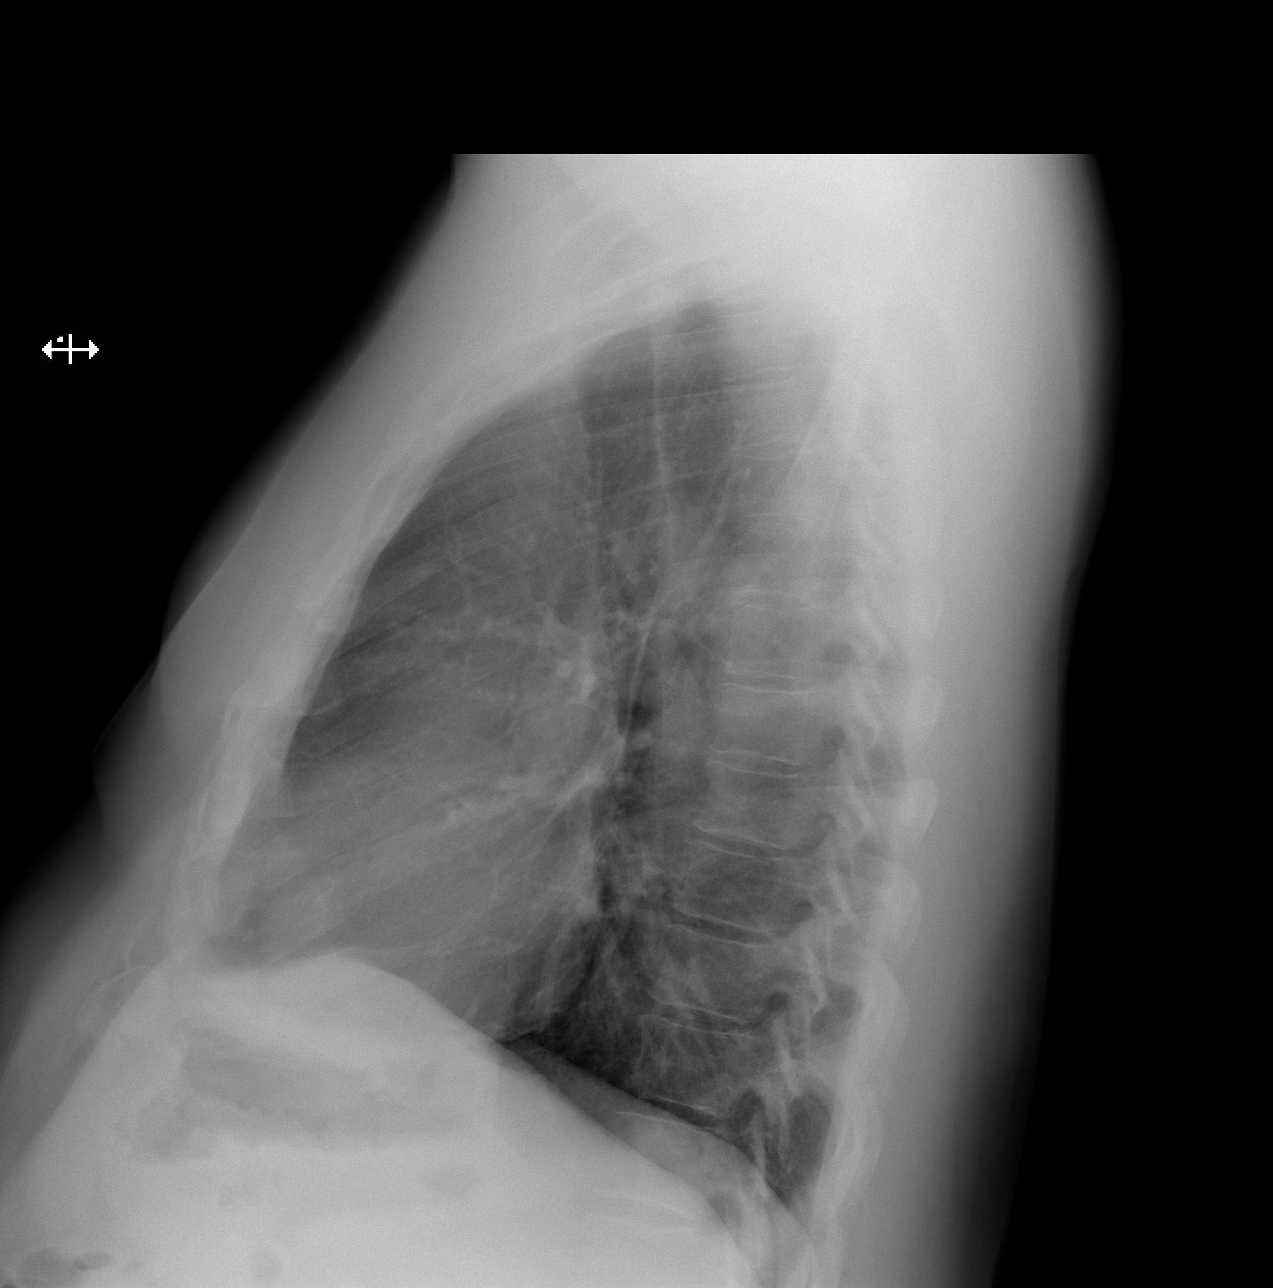

[w chest lat (2 of 2)]
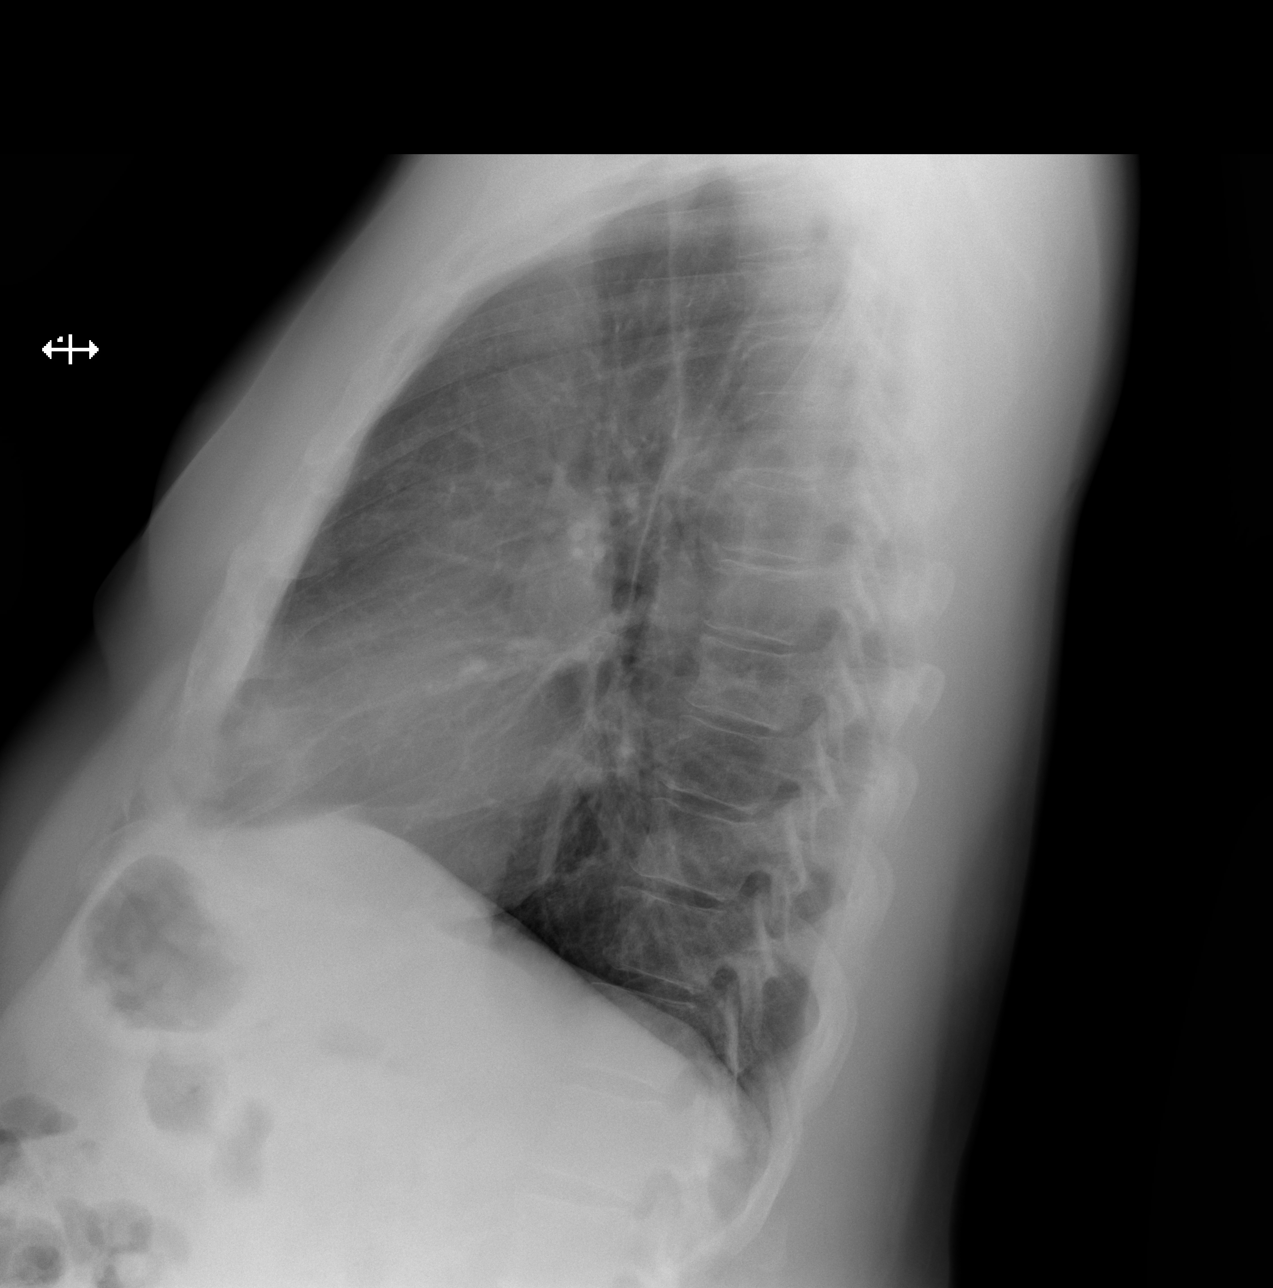

[3 of 3 positions shown; findings below may reference images not displayed]

FINDINGS: Normal lung volumes. Normal cardiac size and mediastinal contours.
Visualized tracheal air column is within normal limits. The lungs
are clear. No pneumothorax or effusion. Mild scoliotic curvature of
the thoracic spine again noted. Otherwise no osseous abnormality.

There does appear to be bilateral calcified carotid artery
atherosclerosis in the neck.
IMPRESSION: Largely unremarkable.  No acute cardiopulmonary abnormality.

## 2018-03-03 ENCOUNTER — Encounter (HOSPITAL_COMMUNITY): Payer: Self-pay | Admitting: Family Medicine

## 2018-03-03 ENCOUNTER — Ambulatory Visit (HOSPITAL_COMMUNITY)
Admission: EM | Admit: 2018-03-03 | Discharge: 2018-03-03 | Disposition: A | Discharge status: Home / Self Care | Payer: Medicare Other | Attending: Family Medicine | Admitting: Family Medicine

## 2018-03-03 DIAGNOSIS — M66261 Spontaneous rupture of extensor tendons, right lower leg: Secondary | ICD-10-CM

## 2018-03-03 DIAGNOSIS — W3400XA Accidental discharge from unspecified firearms or gun, initial encounter: Secondary | ICD-10-CM

## 2018-03-03 DIAGNOSIS — B029 Zoster without complications: Secondary | ICD-10-CM
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
## 2018-03-03 NOTE — ED Triage Notes (Signed)
Triaged by provider. Rash on side for several days.

## 2018-03-12 DIAGNOSIS — I129 Hypertensive chronic kidney disease with stage 1 through stage 4 chronic kidney disease, or unspecified chronic kidney disease: Secondary | ICD-10-CM | POA: Diagnosis not present

## 2018-03-12 DIAGNOSIS — Z940 Kidney transplant status: Secondary | ICD-10-CM | POA: Diagnosis not present

## 2018-03-12 DIAGNOSIS — M109 Gout, unspecified: Secondary | ICD-10-CM | POA: Diagnosis not present

## 2018-03-12 DIAGNOSIS — R7303 Prediabetes: Secondary | ICD-10-CM | POA: Diagnosis not present

## 2018-04-16 DIAGNOSIS — I129 Hypertensive chronic kidney disease with stage 1 through stage 4 chronic kidney disease, or unspecified chronic kidney disease: Secondary | ICD-10-CM | POA: Diagnosis not present

## 2018-04-16 DIAGNOSIS — Z940 Kidney transplant status: Secondary | ICD-10-CM | POA: Diagnosis not present

## 2018-04-16 DIAGNOSIS — M109 Gout, unspecified: Secondary | ICD-10-CM | POA: Diagnosis not present

## 2018-05-06 DIAGNOSIS — Z940 Kidney transplant status: Secondary | ICD-10-CM | POA: Diagnosis not present

## 2018-05-22 DIAGNOSIS — I129 Hypertensive chronic kidney disease with stage 1 through stage 4 chronic kidney disease, or unspecified chronic kidney disease: Secondary | ICD-10-CM | POA: Diagnosis not present

## 2018-05-22 DIAGNOSIS — Z940 Kidney transplant status: Secondary | ICD-10-CM | POA: Diagnosis not present

## 2018-05-22 DIAGNOSIS — M109 Gout, unspecified: Secondary | ICD-10-CM | POA: Diagnosis not present

## 2018-06-19 ENCOUNTER — Encounter: Payer: Self-pay | Admitting: *Deleted

## 2018-06-27 DIAGNOSIS — M109 Gout, unspecified: Secondary | ICD-10-CM | POA: Diagnosis not present

## 2018-06-27 DIAGNOSIS — R7303 Prediabetes: Secondary | ICD-10-CM | POA: Diagnosis not present

## 2018-06-27 DIAGNOSIS — I129 Hypertensive chronic kidney disease with stage 1 through stage 4 chronic kidney disease, or unspecified chronic kidney disease: Secondary | ICD-10-CM | POA: Diagnosis not present

## 2018-06-27 DIAGNOSIS — Z940 Kidney transplant status: Secondary | ICD-10-CM | POA: Diagnosis not present

## 2018-07-28 DIAGNOSIS — Z940 Kidney transplant status: Secondary | ICD-10-CM | POA: Diagnosis not present

## 2018-07-28 DIAGNOSIS — I129 Hypertensive chronic kidney disease with stage 1 through stage 4 chronic kidney disease, or unspecified chronic kidney disease: Secondary | ICD-10-CM | POA: Diagnosis not present

## 2018-07-28 DIAGNOSIS — M109 Gout, unspecified: Secondary | ICD-10-CM | POA: Diagnosis not present

## 2018-09-08 DIAGNOSIS — R7303 Prediabetes: Secondary | ICD-10-CM | POA: Diagnosis not present

## 2018-09-08 DIAGNOSIS — N390 Urinary tract infection, site not specified: Secondary | ICD-10-CM | POA: Diagnosis not present

## 2018-09-08 DIAGNOSIS — Z940 Kidney transplant status: Secondary | ICD-10-CM | POA: Diagnosis not present

## 2018-09-08 DIAGNOSIS — I129 Hypertensive chronic kidney disease with stage 1 through stage 4 chronic kidney disease, or unspecified chronic kidney disease: Secondary | ICD-10-CM | POA: Diagnosis not present

## 2018-09-08 DIAGNOSIS — M109 Gout, unspecified: Secondary | ICD-10-CM | POA: Diagnosis not present

## 2018-11-12 DIAGNOSIS — Z940 Kidney transplant status: Secondary | ICD-10-CM | POA: Diagnosis not present

## 2018-11-12 DIAGNOSIS — I129 Hypertensive chronic kidney disease with stage 1 through stage 4 chronic kidney disease, or unspecified chronic kidney disease: Secondary | ICD-10-CM | POA: Diagnosis not present

## 2018-11-12 DIAGNOSIS — M109 Gout, unspecified: Secondary | ICD-10-CM | POA: Diagnosis not present

## 2018-12-26 DIAGNOSIS — Z940 Kidney transplant status: Secondary | ICD-10-CM | POA: Diagnosis not present

## 2018-12-26 DIAGNOSIS — R7303 Prediabetes: Secondary | ICD-10-CM | POA: Diagnosis not present

## 2018-12-26 DIAGNOSIS — M109 Gout, unspecified: Secondary | ICD-10-CM | POA: Diagnosis not present

## 2018-12-26 DIAGNOSIS — I129 Hypertensive chronic kidney disease with stage 1 through stage 4 chronic kidney disease, or unspecified chronic kidney disease: Secondary | ICD-10-CM | POA: Diagnosis not present

## 2019-01-14 DIAGNOSIS — N390 Urinary tract infection, site not specified: Secondary | ICD-10-CM | POA: Diagnosis not present

## 2019-01-14 DIAGNOSIS — Z940 Kidney transplant status: Secondary | ICD-10-CM | POA: Diagnosis not present

## 2019-03-05 DIAGNOSIS — Z940 Kidney transplant status: Secondary | ICD-10-CM | POA: Diagnosis not present

## 2019-03-05 DIAGNOSIS — M109 Gout, unspecified: Secondary | ICD-10-CM | POA: Diagnosis not present

## 2019-03-05 DIAGNOSIS — I129 Hypertensive chronic kidney disease with stage 1 through stage 4 chronic kidney disease, or unspecified chronic kidney disease: Secondary | ICD-10-CM | POA: Diagnosis not present

## 2019-03-31 DIAGNOSIS — Z862 Personal history of diseases of the blood and blood-forming organs and certain disorders involving the immune mechanism: Secondary | ICD-10-CM | POA: Diagnosis not present

## 2019-03-31 DIAGNOSIS — Z8719 Personal history of other diseases of the digestive system: Secondary | ICD-10-CM | POA: Diagnosis not present

## 2019-03-31 DIAGNOSIS — Z7952 Long term (current) use of systemic steroids: Secondary | ICD-10-CM | POA: Diagnosis not present

## 2019-03-31 DIAGNOSIS — R739 Hyperglycemia, unspecified: Secondary | ICD-10-CM | POA: Diagnosis not present

## 2019-03-31 DIAGNOSIS — R319 Hematuria, unspecified: Secondary | ICD-10-CM | POA: Diagnosis not present

## 2019-03-31 DIAGNOSIS — B259 Cytomegaloviral disease, unspecified: Secondary | ICD-10-CM | POA: Diagnosis not present

## 2019-03-31 DIAGNOSIS — Z87891 Personal history of nicotine dependence: Secondary | ICD-10-CM | POA: Diagnosis not present

## 2019-03-31 DIAGNOSIS — R809 Proteinuria, unspecified: Secondary | ICD-10-CM | POA: Diagnosis not present

## 2019-03-31 DIAGNOSIS — I151 Hypertension secondary to other renal disorders: Secondary | ICD-10-CM | POA: Diagnosis not present

## 2019-03-31 DIAGNOSIS — Z940 Kidney transplant status: Secondary | ICD-10-CM | POA: Diagnosis not present

## 2019-03-31 DIAGNOSIS — L299 Pruritus, unspecified: Secondary | ICD-10-CM | POA: Diagnosis not present

## 2019-03-31 DIAGNOSIS — E872 Acidosis: Secondary | ICD-10-CM | POA: Diagnosis not present

## 2019-03-31 DIAGNOSIS — Z4822 Encounter for aftercare following kidney transplant: Secondary | ICD-10-CM | POA: Diagnosis not present

## 2019-03-31 DIAGNOSIS — N289 Disorder of kidney and ureter, unspecified: Secondary | ICD-10-CM | POA: Diagnosis not present

## 2019-03-31 DIAGNOSIS — M109 Gout, unspecified: Secondary | ICD-10-CM | POA: Diagnosis not present

## 2019-03-31 DIAGNOSIS — E1165 Type 2 diabetes mellitus with hyperglycemia: Secondary | ICD-10-CM | POA: Diagnosis not present

## 2019-03-31 DIAGNOSIS — E785 Hyperlipidemia, unspecified: Secondary | ICD-10-CM | POA: Diagnosis not present

## 2019-03-31 DIAGNOSIS — E1129 Type 2 diabetes mellitus with other diabetic kidney complication: Secondary | ICD-10-CM | POA: Diagnosis not present

## 2019-03-31 DIAGNOSIS — Z79899 Other long term (current) drug therapy: Secondary | ICD-10-CM | POA: Diagnosis not present

## 2019-03-31 DIAGNOSIS — Z792 Long term (current) use of antibiotics: Secondary | ICD-10-CM | POA: Diagnosis not present

## 2019-03-31 DIAGNOSIS — R7989 Other specified abnormal findings of blood chemistry: Secondary | ICD-10-CM | POA: Diagnosis not present

## 2019-03-31 DIAGNOSIS — Z7289 Other problems related to lifestyle: Secondary | ICD-10-CM | POA: Diagnosis not present

## 2019-03-31 DIAGNOSIS — R300 Dysuria: Secondary | ICD-10-CM | POA: Diagnosis not present

## 2019-04-06 ENCOUNTER — Ambulatory Visit: Payer: Medicare Other

## 2019-04-06 ENCOUNTER — Encounter: Payer: Self-pay | Admitting: Internal Medicine

## 2019-04-07 DIAGNOSIS — R808 Other proteinuria: Secondary | ICD-10-CM | POA: Diagnosis not present

## 2019-04-07 DIAGNOSIS — Z940 Kidney transplant status: Secondary | ICD-10-CM | POA: Diagnosis not present

## 2019-04-09 DIAGNOSIS — N189 Chronic kidney disease, unspecified: Secondary | ICD-10-CM | POA: Diagnosis not present

## 2019-04-09 DIAGNOSIS — T8611 Kidney transplant rejection: Secondary | ICD-10-CM | POA: Diagnosis not present

## 2019-04-09 DIAGNOSIS — Z940 Kidney transplant status: Secondary | ICD-10-CM | POA: Diagnosis not present

## 2019-04-09 DIAGNOSIS — I701 Atherosclerosis of renal artery: Secondary | ICD-10-CM | POA: Diagnosis not present

## 2019-04-09 DIAGNOSIS — N269 Renal sclerosis, unspecified: Secondary | ICD-10-CM | POA: Diagnosis not present

## 2019-04-09 DIAGNOSIS — R809 Proteinuria, unspecified: Secondary | ICD-10-CM | POA: Diagnosis not present

## 2019-04-09 DIAGNOSIS — T8619 Other complication of kidney transplant: Secondary | ICD-10-CM | POA: Diagnosis not present

## 2019-04-09 DIAGNOSIS — Z5181 Encounter for therapeutic drug level monitoring: Secondary | ICD-10-CM | POA: Diagnosis not present

## 2019-04-09 DIAGNOSIS — R7989 Other specified abnormal findings of blood chemistry: Secondary | ICD-10-CM | POA: Diagnosis not present

## 2019-04-09 DIAGNOSIS — Z4822 Encounter for aftercare following kidney transplant: Secondary | ICD-10-CM | POA: Diagnosis not present

## 2019-04-09 DIAGNOSIS — N261 Atrophy of kidney (terminal): Secondary | ICD-10-CM | POA: Diagnosis not present

## 2019-04-09 DIAGNOSIS — N12 Tubulo-interstitial nephritis, not specified as acute or chronic: Secondary | ICD-10-CM | POA: Diagnosis not present

## 2019-04-16 DIAGNOSIS — Z7952 Long term (current) use of systemic steroids: Secondary | ICD-10-CM | POA: Diagnosis not present

## 2019-04-16 DIAGNOSIS — E785 Hyperlipidemia, unspecified: Secondary | ICD-10-CM | POA: Diagnosis not present

## 2019-04-16 DIAGNOSIS — E872 Acidosis: Secondary | ICD-10-CM | POA: Diagnosis not present

## 2019-04-16 DIAGNOSIS — Z4822 Encounter for aftercare following kidney transplant: Secondary | ICD-10-CM | POA: Diagnosis not present

## 2019-04-16 DIAGNOSIS — I129 Hypertensive chronic kidney disease with stage 1 through stage 4 chronic kidney disease, or unspecified chronic kidney disease: Secondary | ICD-10-CM | POA: Diagnosis not present

## 2019-04-16 DIAGNOSIS — D631 Anemia in chronic kidney disease: Secondary | ICD-10-CM | POA: Diagnosis not present

## 2019-04-16 DIAGNOSIS — T8611 Kidney transplant rejection: Secondary | ICD-10-CM | POA: Diagnosis not present

## 2019-04-16 DIAGNOSIS — E1122 Type 2 diabetes mellitus with diabetic chronic kidney disease: Secondary | ICD-10-CM | POA: Diagnosis not present

## 2019-04-16 DIAGNOSIS — E1142 Type 2 diabetes mellitus with diabetic polyneuropathy: Secondary | ICD-10-CM | POA: Diagnosis not present

## 2019-04-16 DIAGNOSIS — N184 Chronic kidney disease, stage 4 (severe): Secondary | ICD-10-CM | POA: Diagnosis not present

## 2019-04-16 DIAGNOSIS — Z833 Family history of diabetes mellitus: Secondary | ICD-10-CM | POA: Diagnosis not present

## 2019-04-16 DIAGNOSIS — N186 End stage renal disease: Secondary | ICD-10-CM | POA: Diagnosis not present

## 2019-04-16 DIAGNOSIS — N2581 Secondary hyperparathyroidism of renal origin: Secondary | ICD-10-CM | POA: Diagnosis not present

## 2019-04-16 DIAGNOSIS — T380X5A Adverse effect of glucocorticoids and synthetic analogues, initial encounter: Secondary | ICD-10-CM | POA: Diagnosis not present

## 2019-04-16 DIAGNOSIS — Z8249 Family history of ischemic heart disease and other diseases of the circulatory system: Secondary | ICD-10-CM | POA: Diagnosis not present

## 2019-04-16 DIAGNOSIS — F17200 Nicotine dependence, unspecified, uncomplicated: Secondary | ICD-10-CM | POA: Diagnosis not present

## 2019-04-16 DIAGNOSIS — M1990 Unspecified osteoarthritis, unspecified site: Secondary | ICD-10-CM | POA: Diagnosis not present

## 2019-04-16 DIAGNOSIS — Z5181 Encounter for therapeutic drug level monitoring: Secondary | ICD-10-CM | POA: Diagnosis not present

## 2019-04-16 DIAGNOSIS — Z7289 Other problems related to lifestyle: Secondary | ICD-10-CM | POA: Diagnosis not present

## 2019-04-16 DIAGNOSIS — M109 Gout, unspecified: Secondary | ICD-10-CM | POA: Diagnosis not present

## 2019-04-16 DIAGNOSIS — E1165 Type 2 diabetes mellitus with hyperglycemia: Secondary | ICD-10-CM | POA: Diagnosis not present

## 2019-04-16 DIAGNOSIS — Z841 Family history of disorders of kidney and ureter: Secondary | ICD-10-CM | POA: Diagnosis not present

## 2019-04-16 DIAGNOSIS — I10 Essential (primary) hypertension: Secondary | ICD-10-CM | POA: Diagnosis not present

## 2019-04-16 DIAGNOSIS — Z79899 Other long term (current) drug therapy: Secondary | ICD-10-CM | POA: Diagnosis not present

## 2019-04-16 DIAGNOSIS — Z940 Kidney transplant status: Secondary | ICD-10-CM | POA: Diagnosis not present

## 2019-04-30 DIAGNOSIS — Z794 Long term (current) use of insulin: Secondary | ICD-10-CM | POA: Diagnosis not present

## 2019-04-30 DIAGNOSIS — E1129 Type 2 diabetes mellitus with other diabetic kidney complication: Secondary | ICD-10-CM | POA: Diagnosis not present

## 2019-04-30 DIAGNOSIS — Z940 Kidney transplant status: Secondary | ICD-10-CM | POA: Diagnosis not present

## 2019-04-30 DIAGNOSIS — E1165 Type 2 diabetes mellitus with hyperglycemia: Secondary | ICD-10-CM | POA: Diagnosis not present

## 2019-04-30 DIAGNOSIS — E1122 Type 2 diabetes mellitus with diabetic chronic kidney disease: Secondary | ICD-10-CM | POA: Diagnosis not present

## 2019-04-30 DIAGNOSIS — N186 End stage renal disease: Secondary | ICD-10-CM | POA: Diagnosis not present

## 2019-05-11 DIAGNOSIS — Z794 Long term (current) use of insulin: Secondary | ICD-10-CM | POA: Diagnosis not present

## 2019-05-11 DIAGNOSIS — E1129 Type 2 diabetes mellitus with other diabetic kidney complication: Secondary | ICD-10-CM | POA: Diagnosis not present

## 2019-05-11 DIAGNOSIS — Z940 Kidney transplant status: Secondary | ICD-10-CM | POA: Diagnosis not present

## 2019-05-18 DIAGNOSIS — Z4822 Encounter for aftercare following kidney transplant: Secondary | ICD-10-CM | POA: Diagnosis not present

## 2019-05-18 DIAGNOSIS — E785 Hyperlipidemia, unspecified: Secondary | ICD-10-CM | POA: Diagnosis not present

## 2019-05-18 DIAGNOSIS — Z992 Dependence on renal dialysis: Secondary | ICD-10-CM | POA: Diagnosis not present

## 2019-05-18 DIAGNOSIS — N186 End stage renal disease: Secondary | ICD-10-CM | POA: Diagnosis not present

## 2019-05-18 DIAGNOSIS — Z794 Long term (current) use of insulin: Secondary | ICD-10-CM | POA: Diagnosis not present

## 2019-05-18 DIAGNOSIS — E1129 Type 2 diabetes mellitus with other diabetic kidney complication: Secondary | ICD-10-CM | POA: Diagnosis not present

## 2019-05-18 DIAGNOSIS — E1122 Type 2 diabetes mellitus with diabetic chronic kidney disease: Secondary | ICD-10-CM | POA: Diagnosis not present

## 2019-05-18 DIAGNOSIS — Z8619 Personal history of other infectious and parasitic diseases: Secondary | ICD-10-CM | POA: Diagnosis not present

## 2019-05-18 DIAGNOSIS — I120 Hypertensive chronic kidney disease with stage 5 chronic kidney disease or end stage renal disease: Secondary | ICD-10-CM | POA: Diagnosis not present

## 2019-05-18 DIAGNOSIS — T8611 Kidney transplant rejection: Secondary | ICD-10-CM | POA: Diagnosis not present

## 2019-05-18 DIAGNOSIS — Z7952 Long term (current) use of systemic steroids: Secondary | ICD-10-CM | POA: Diagnosis not present

## 2019-05-18 DIAGNOSIS — I10 Essential (primary) hypertension: Secondary | ICD-10-CM | POA: Diagnosis not present

## 2019-05-18 DIAGNOSIS — Z940 Kidney transplant status: Secondary | ICD-10-CM | POA: Diagnosis not present

## 2019-05-18 DIAGNOSIS — D849 Immunodeficiency, unspecified: Secondary | ICD-10-CM | POA: Diagnosis not present

## 2019-05-18 DIAGNOSIS — M109 Gout, unspecified: Secondary | ICD-10-CM | POA: Diagnosis not present

## 2019-05-18 DIAGNOSIS — Z79899 Other long term (current) drug therapy: Secondary | ICD-10-CM | POA: Diagnosis not present

## 2019-05-18 DIAGNOSIS — E872 Acidosis: Secondary | ICD-10-CM | POA: Diagnosis not present

## 2019-06-12 DIAGNOSIS — I129 Hypertensive chronic kidney disease with stage 1 through stage 4 chronic kidney disease, or unspecified chronic kidney disease: Secondary | ICD-10-CM | POA: Diagnosis not present

## 2019-06-12 DIAGNOSIS — Z940 Kidney transplant status: Secondary | ICD-10-CM | POA: Diagnosis not present

## 2019-06-12 DIAGNOSIS — R7303 Prediabetes: Secondary | ICD-10-CM | POA: Diagnosis not present

## 2019-06-12 DIAGNOSIS — M109 Gout, unspecified: Secondary | ICD-10-CM | POA: Diagnosis not present

## 2019-09-02 ENCOUNTER — Ambulatory Visit (HOSPITAL_COMMUNITY)
Admission: EM | Admit: 2019-09-02 | Discharge: 2019-09-02 | Disposition: A | Discharge status: Home / Self Care | Payer: Medicare Other | Attending: Urgent Care | Admitting: Urgent Care

## 2019-09-02 ENCOUNTER — Encounter (HOSPITAL_COMMUNITY): Payer: Self-pay

## 2019-09-02 ENCOUNTER — Other Ambulatory Visit: Payer: Self-pay

## 2019-09-02 DIAGNOSIS — Z7251 High risk heterosexual behavior: Secondary | ICD-10-CM | POA: Diagnosis not present

## 2019-09-02 NOTE — ED Provider Notes (Signed)
[facility]   MRN: [id number] DOB: 07/08/1955  Subjective:   Christopher Chapman is a 64 y.o. male presenting for STI testing. States that he only wants to get checked because he had unprotected sex recently. Denies dysuria, hematuria, urinary frequency, penile discharge, penile swelling, testicular pain, testicular swelling, anal pain, groin pain despite initially reporting sx at triage.   No current facility-administered medications for this encounter.  Current Outpatient Medications:  .  ACCU-CHEK AVIVA PLUS test strip, USE 1 STRIP DAILY TO TEST BLOOD SUGAR, Disp: , Rfl: 0 .  allopurinol (ZYLOPRIM) 100 MG tablet, Take 200 mg by mouth daily. , Disp: , Rfl:  .  Blood Glucose Monitoring Suppl (ACCU-CHEK AVIVA PLUS) w/Device KIT, USE AS INSTRUCTED; PLEASE CHECK BLOOD SUGAR ONCE A DAY 1 2 HRS AFTER MEALS, Disp: , Rfl: 0 .  labetalol (NORMODYNE) 300 MG tablet, Take 150 mg by mouth 2 (two) times daily., Disp: , Rfl:  .  Lancets (ACCU-CHEK MULTICLIX) lancets, Inject into the skin., Disp: , Rfl:  .  mupirocin ointment (BACTROBAN) 2 %, Apply 1 application topically 3 (three) times daily., Disp: 22 g, Rfl: 1 .  mycophenolate (MYFORTIC) 180 MG EC tablet, Take 360 mg by mouth 2 (two) times daily., Disp: , Rfl:  .  NIFEdipine (PROCARDIA XL/ADALAT-CC) 90 MG 24 hr tablet, Take 90 mg by mouth daily., Disp: , Rfl:  .  simvastatin (ZOCOR) 10 MG tablet, Take 10 mg by mouth daily., Disp: , Rfl:  .  tacrolimus (PROGRAF) 1 MG capsule, Take 4 mg by mouth 2 (two) times daily. , Disp: , Rfl:    No Known Allergies  Past Medical History:  Diagnosis Date  . Anemia   . Arthritis   . CRF (chronic renal failure)   . ESRD (end stage renal disease) ([facility])   . Family history of anesthesia complication    [place] going to sleep.  . Gout   . Hx of cocaine abuse ([facility])   . Hyperlipidemia   . Hypertension   . Hyperuricemia   . MRSA (methicillin resistant staph aureus) culture positive 07/2011  .  Parathyroid disease ([facility])    Hx of , No meds at present  . Tobacco abuse   . Tuberculosis 1985   Treated with imeds for a years.     Past Surgical History:  Procedure Laterality Date  . ANGIOPLASTY  08/09/2011   Procedure: ANGIOPLASTY;  Surgeon: Christopher S Dickson, MD;  Location: [facility];  Service: Vascular;  Laterality: Left;  Left Braciocephalic Fistula using Vascu-Guard Patch  . APPENDECTOMY  1980  . AV FISTULA PLACEMENT Left 2010  . INCISION AND DRAINAGE  12/2012   sebacebus cyst, infected  . MANDIBLE FRACTURE SURGERY    . [name] N/A 07/16/2011   Procedure: [name];  Surgeon: Christopher S Dickson, MD;  Location: [facility];  Service: Cardiovascular;  Laterality: N/A;    Family History  Problem Relation Age of Onset  . Diabetes Father   . Heart disease Father   . Hypertension Father   . Other Father        amputation  . Healthy Mother   . Diabetes Sister   . Heart disease Sister   . Colon cancer Neg Hx   . Stomach cancer Neg Hx   . Rectal cancer Neg Hx   . Esophageal cancer Neg Hx   . Liver cancer Neg Hx   . Colon polyps Neg Hx     Social History   Tobacco Use  .   Smoking status: Current Every Day Smoker    Packs/day: 0.10    Years: 30.00    Pack years: 3.00    Types: Cigarettes    Start date: 02/15/2016  . Smokeless tobacco: Never Used  . Tobacco comment: Quit in Feb.  Substance Use Topics  . Alcohol use: Yes    Comment: occasional  . Drug use: No    Frequency: 4.0 times per week    ROS   Objective:   Vitals: BP (!) 161/83 (BP Location: Right Arm)   Pulse 95   Temp 97.6 F (36.4 C) (Oral)   Resp 18   SpO2 97%   Physical Exam Constitutional:      General: He is not in acute distress.    Appearance: Normal appearance. He is well-developed and normal weight. He is not ill-appearing, toxic-appearing or diaphoretic.  HENT:     Head: Normocephalic and atraumatic.     Right Ear: External ear normal.     Left Ear: External ear normal.       Nose: Nose normal.     Mouth/Throat:     Pharynx: Oropharynx is clear.  Eyes:     General: No scleral icterus.       Right eye: No discharge.        Left eye: No discharge.     Extraocular Movements: Extraocular movements intact.     Pupils: Pupils are equal, round, and reactive to light.  Cardiovascular:     Rate and Rhythm: Normal rate.  Pulmonary:     Effort: Pulmonary effort is normal.  Genitourinary:    Penis: Uncircumcised. No phimosis, paraphimosis, hypospadias, erythema, tenderness, discharge, swelling or lesions.   Musculoskeletal:     Cervical back: Normal range of motion.  Neurological:     Mental Status: He is alert and oriented to person, place, and time.  Psychiatric:        Mood and Affect: Mood normal.        Behavior: Behavior normal.        Thought Content: Thought content normal.        Judgment: Judgment normal.      Assessment and Plan :   PDMP not reviewed this encounter.  1. Unprotected sex    Labs pending will tx as appropriate.    Mani, Mario, PA-C 09/03/19 0731  

## 2019-09-02 NOTE — ED Triage Notes (Addendum)
Pt is here with penile irritation that states started last Thursday, pt last had unprotected sex 08/07/2017(his bday).

## 2019-09-03 LAB — CYTOLOGY, (ORAL, ANAL, URETHRAL) ANCILLARY ONLY
Chlamydia: NEGATIVE
Comment: NEGATIVE
Comment: NEGATIVE
Comment: NORMAL
Neisseria Gonorrhea: NEGATIVE
Trichomonas: NEGATIVE

## 2019-10-01 DIAGNOSIS — Z792 Long term (current) use of antibiotics: Secondary | ICD-10-CM | POA: Diagnosis not present

## 2019-10-01 DIAGNOSIS — E872 Acidosis: Secondary | ICD-10-CM | POA: Diagnosis not present

## 2019-10-01 DIAGNOSIS — Z4822 Encounter for aftercare following kidney transplant: Secondary | ICD-10-CM | POA: Diagnosis not present

## 2019-10-01 DIAGNOSIS — I10 Essential (primary) hypertension: Secondary | ICD-10-CM | POA: Diagnosis not present

## 2019-10-01 DIAGNOSIS — D8989 Other specified disorders involving the immune mechanism, not elsewhere classified: Secondary | ICD-10-CM | POA: Diagnosis not present

## 2019-10-01 DIAGNOSIS — Z79899 Other long term (current) drug therapy: Secondary | ICD-10-CM | POA: Diagnosis not present

## 2019-10-01 DIAGNOSIS — F1011 Alcohol abuse, in remission: Secondary | ICD-10-CM | POA: Diagnosis not present

## 2019-10-01 DIAGNOSIS — N186 End stage renal disease: Secondary | ICD-10-CM | POA: Diagnosis not present

## 2019-10-01 DIAGNOSIS — B258 Other cytomegaloviral diseases: Secondary | ICD-10-CM | POA: Diagnosis not present

## 2019-10-01 DIAGNOSIS — Z794 Long term (current) use of insulin: Secondary | ICD-10-CM | POA: Diagnosis not present

## 2019-10-01 DIAGNOSIS — Z940 Kidney transplant status: Secondary | ICD-10-CM | POA: Diagnosis not present

## 2019-10-01 DIAGNOSIS — M109 Gout, unspecified: Secondary | ICD-10-CM | POA: Diagnosis not present

## 2019-10-01 DIAGNOSIS — E785 Hyperlipidemia, unspecified: Secondary | ICD-10-CM | POA: Diagnosis not present

## 2019-10-01 DIAGNOSIS — Z7952 Long term (current) use of systemic steroids: Secondary | ICD-10-CM | POA: Diagnosis not present

## 2019-10-01 DIAGNOSIS — E119 Type 2 diabetes mellitus without complications: Secondary | ICD-10-CM | POA: Diagnosis not present

## 2019-10-01 DIAGNOSIS — E1122 Type 2 diabetes mellitus with diabetic chronic kidney disease: Secondary | ICD-10-CM | POA: Diagnosis not present

## 2019-10-01 DIAGNOSIS — I120 Hypertensive chronic kidney disease with stage 5 chronic kidney disease or end stage renal disease: Secondary | ICD-10-CM | POA: Diagnosis not present

## 2019-10-01 DIAGNOSIS — K649 Unspecified hemorrhoids: Secondary | ICD-10-CM | POA: Diagnosis not present

## 2020-01-11 DIAGNOSIS — E1129 Type 2 diabetes mellitus with other diabetic kidney complication: Secondary | ICD-10-CM | POA: Diagnosis not present

## 2020-01-11 DIAGNOSIS — Z940 Kidney transplant status: Secondary | ICD-10-CM | POA: Diagnosis not present

## 2020-01-11 DIAGNOSIS — N186 End stage renal disease: Secondary | ICD-10-CM | POA: Diagnosis not present

## 2020-01-11 DIAGNOSIS — Z794 Long term (current) use of insulin: Secondary | ICD-10-CM | POA: Diagnosis not present

## 2020-01-11 DIAGNOSIS — I120 Hypertensive chronic kidney disease with stage 5 chronic kidney disease or end stage renal disease: Secondary | ICD-10-CM | POA: Diagnosis not present

## 2020-01-18 DIAGNOSIS — I129 Hypertensive chronic kidney disease with stage 1 through stage 4 chronic kidney disease, or unspecified chronic kidney disease: Secondary | ICD-10-CM | POA: Diagnosis not present

## 2020-01-18 DIAGNOSIS — Z720 Tobacco use: Secondary | ICD-10-CM | POA: Diagnosis not present

## 2020-01-18 DIAGNOSIS — Z940 Kidney transplant status: Secondary | ICD-10-CM | POA: Diagnosis not present

## 2020-01-18 DIAGNOSIS — Z79899 Other long term (current) drug therapy: Secondary | ICD-10-CM | POA: Diagnosis not present

## 2020-01-18 DIAGNOSIS — R7303 Prediabetes: Secondary | ICD-10-CM | POA: Diagnosis not present

## 2020-01-18 DIAGNOSIS — M109 Gout, unspecified: Secondary | ICD-10-CM | POA: Diagnosis not present

## 2020-02-10 ENCOUNTER — Encounter: Payer: Self-pay | Admitting: *Deleted

## 2020-02-10 NOTE — Progress Notes (Unsigned)

## 2020-02-10 NOTE — Progress Notes (Signed)
Hello. Patient has not been seen in our clinic for nearly 4 years. I feel she would require a office visit before Medicare annual wellness visit can be done.

## 2020-02-11 ENCOUNTER — Encounter: Payer: Self-pay | Admitting: Internal Medicine

## 2020-03-15 ENCOUNTER — Other Ambulatory Visit: Payer: Self-pay

## 2020-03-15 ENCOUNTER — Ambulatory Visit (INDEPENDENT_AMBULATORY_CARE_PROVIDER_SITE_OTHER): Payer: Medicare Other | Admitting: Student

## 2020-03-15 VITALS — BP 201/121 | HR 79 | Temp 97.7°F | Ht 72.0 in | Wt 253.5 lb

## 2020-03-15 DIAGNOSIS — Z0000 Encounter for general adult medical examination without abnormal findings: Secondary | ICD-10-CM | POA: Diagnosis not present

## 2020-03-15 DIAGNOSIS — E119 Type 2 diabetes mellitus without complications: Secondary | ICD-10-CM | POA: Diagnosis not present

## 2020-03-15 DIAGNOSIS — N186 End stage renal disease: Secondary | ICD-10-CM | POA: Diagnosis not present

## 2020-03-15 DIAGNOSIS — Z940 Kidney transplant status: Secondary | ICD-10-CM

## 2020-03-15 DIAGNOSIS — I10 Essential (primary) hypertension: Secondary | ICD-10-CM | POA: Diagnosis not present

## 2020-03-15 DIAGNOSIS — E785 Hyperlipidemia, unspecified: Secondary | ICD-10-CM

## 2020-03-15 MED ORDER — LABETALOL HCL 300 MG PO TABS: 300.0000 mg | ORAL_TABLET | Freq: Three times a day (TID) | ORAL | 0 refills | Status: DC

## 2020-03-15 MED ORDER — ASPIRIN EC 81 MG PO TBEC: 81.0000 mg | DELAYED_RELEASE_TABLET | Freq: Every day | ORAL | 0 refills | Status: DC

## 2020-03-15 MED ORDER — AMLODIPINE BESYLATE 5 MG PO TABS: 5.0000 mg | ORAL_TABLET | Freq: Every day | ORAL | 11 refills | Status: DC

## 2020-03-15 NOTE — Assessment & Plan Note (Addendum)
BP Readings from Last 3 Encounters:  03/15/20 (!) 201/121  09/02/19 (!) 161/83  03/03/18 (!) 149/87   Patient reports he has not taken labetalol the last two days because he ran out. Denies chest pain, dyspnea, nausea, vomiting, diaphoresis. On exam, patient with 1+ pitting edema, most likely due to his proteinuria. Given hx kidney transplant and worsening renal function, medication options are limited. Will increase labetalol to TID and start amlodipine.  - Increase labretalol 300mg  TID - Start amlodipine 5mg  qd

## 2020-03-15 NOTE — Progress Notes (Signed)
   CC: hypertension, diabetes  HPI:  [name] is a 65 y.o. with CKD s/p kidney transplant, hypertension, hyperlipidemia, type II diabetes mellitus presenting to [facility] today to follow-up for hypertension and diabetes.  Please see problem-based list for further details, assessments, and plans.  Past Medical History:  Diagnosis Date  . Anemia   . Arthritis   . CRF (chronic renal failure)   . ESRD (end stage renal disease) ([place])   . Family history of anesthesia complication    [place] going to sleep.  . Gout   . Hx of cocaine abuse ([place])   . Hyperlipidemia   . Hypertension   . Hyperuricemia   . MRSA (methicillin resistant staph aureus) culture positive 07/2011  . Parathyroid disease ([place])    Hx of , No meds at present  . Tobacco abuse   . Tuberculosis 1985   Treated with imeds for a years.   Current Medications: Mycophenolate Simvastatin 10mg  Labetalol 300mg  BID Tacrolimus 1mg  QID Doxazosin 8mg  QHS Sodium bicarb 650 Valganciclovir 450mg  Alluopurinol 100mg  BID ASA 81mg   Review of Systems:  As per HPI.  Physical Exam:  Vitals:   03/15/20 1501  BP: (!) 201/121  Pulse: 79  Temp: 97.7 F (36.5 C)  TempSrc: Oral  SpO2: 98%  Weight: 253 lb 8 oz (115 kg)  Height: 6' (1.829 m)   General: Non-ill appearing. No acute distress CV: Regular rate, rhythm. No m/r/g appreciated Pulm: Normal WOB. Clear to auscultation bilaterally. MSK: 1+ pitting edema to mid-tibia bilaterally. Normal bulk, tone.  Assessment & Plan:   See Encounters Tab for problem based charting.  Patient discussed with Dr. Rebeca Alert

## 2020-03-15 NOTE — Patient Instructions (Signed)
[name],  It was a pleasure seeing you today!  Today we discussed your kidney transplant and your blood pressure. I have added a medication called amlodipine. You will take this once daily. I have also increased your labetalol. Please take this tree times per day.  It is VERY important that you contact your kidney doctor and schedule an appointment with them very soon.  We look forward to seeing you next time. Please call our clinic at [phone] if you have any questions or concerns. The best time to call is Monday-Friday from 9am-4pm, but there is someone available 24/7 at the same number. If you need medication refills, please notify your pharmacy one week in advance and they will send [place] a request.  Thank you for letting us take part in your care. Wishing you the best!  Thank you, Dr. [name], MD

## 2020-03-15 NOTE — Assessment & Plan Note (Signed)
Patient received J&J COVID-19 vaccine in June 2021. States he is willing to get booster.

## 2020-03-15 NOTE — Assessment & Plan Note (Addendum)
Patient presenting to clinic after kidney transplant in 2019. Currently  On tacrolimus, mycophenolate, reports compliance. States he was recently told his kidney was worsening.  A/P: Per chart review (CareEverywhere), renal function declining (last check 01/2020) with massive proteinuria. Today on exam, patient markedly hypertensive with 1+ pitting edema. Discussed with patient that it is imperative for him to make an appointment to his nephrologist within the next couple of days. Patient's uncontrolled hypertension likely contributing to kidney function. - Patient to see nephrologist as soon as possible - CMET today - CCM referral placed today

## 2020-03-15 NOTE — Assessment & Plan Note (Signed)
A1c 5.4 in January 2022 (9.0 03/2019). Glucometer readings largely within normal limits, no readings above 170. Will continue to follow. - F/u A1c at next visit

## 2020-03-16 ENCOUNTER — Other Ambulatory Visit: Payer: Self-pay | Admitting: Student

## 2020-03-16 DIAGNOSIS — E785 Hyperlipidemia, unspecified: Secondary | ICD-10-CM

## 2020-03-16 LAB — CMP14 + ANION GAP
ALT: 10 IU/L (ref 0–44)
AST: 12 IU/L (ref 0–40)
Albumin/Globulin Ratio: 1.1 — ABNORMAL LOW (ref 1.2–2.2)
Albumin: 3.5 g/dL — ABNORMAL LOW (ref 3.8–4.8)
Alkaline Phosphatase: 94 IU/L (ref 44–121)
Anion Gap: 14 mmol/L (ref 10.0–18.0)
BUN/Creatinine Ratio: 7 — ABNORMAL LOW (ref 10–24)
BUN: 42 mg/dL — ABNORMAL HIGH (ref 8–27)
Bilirubin Total: <0.2 mg/dL (ref 0.0–1.2)
CO2: 19 mmol/L — ABNORMAL LOW (ref 20–29)
Calcium: 7.9 mg/dL — ABNORMAL LOW (ref 8.6–10.2)
Chloride: 107 mmol/L — ABNORMAL HIGH (ref 96–106)
Creatinine, Ser: 5.76 mg/dL — ABNORMAL HIGH (ref 0.76–1.27)
Globulin, Total: 3.1 g/dL (ref 1.5–4.5)
Glucose: 98 mg/dL (ref 65–99)
Potassium: 4.8 mmol/L (ref 3.5–5.2)
Sodium: 140 mmol/L (ref 134–144)
Total Protein: 6.6 g/dL (ref 6.0–8.5)
eGFR: 10 mL/min/{1.73_m2} — ABNORMAL LOW (ref >59)

## 2020-03-16 LAB — LIPID PANEL
Chol/HDL Ratio: 3 ratio (ref 0.0–5.0)
Cholesterol, Total: 181 mg/dL (ref 100–199)
HDL: 60 mg/dL (ref >39)
LDL Chol Calc (NIH): 78 mg/dL (ref 0–99)
Triglycerides: 266 mg/dL — ABNORMAL HIGH (ref 0–149)
VLDL Cholesterol Cal: 43 mg/dL — ABNORMAL HIGH (ref 5–40)

## 2020-03-16 LAB — CBC
Hematocrit: 39.3 % (ref 37.5–51.0)
Hemoglobin: 12.6 g/dL — ABNORMAL LOW (ref 13.0–17.7)
MCH: 28.8 pg (ref 26.6–33.0)
MCHC: 32.1 g/dL (ref 31.5–35.7)
MCV: 90 fL (ref 79–97)
Platelets: 152 10*3/uL (ref 150–450)
RBC: 4.38 x10E6/uL (ref 4.14–5.80)
RDW: 14.5 % (ref 11.6–15.4)
WBC: 4.9 10*3/uL (ref 3.4–10.8)

## 2020-03-16 MED ORDER — ATORVASTATIN CALCIUM 40 MG PO TABS: 40.0000 mg | ORAL_TABLET | Freq: Every day | ORAL | 11 refills | Status: DC

## 2020-03-16 NOTE — Progress Notes (Signed)
Called patient 3/9 with results. Discussed his worsening renal function and urgency to see his nephrologist, Dr. Moshe Cipro. Also discussed elevated cholesterol, will switch to high-intensity statin. Patient verbalized understanding and had no further questions. Called Dr. Shelva Majestic office, who will also get in touch with the patient.

## 2020-03-16 NOTE — Addendum Note (Signed)
Addended bySanjuan Dame on: 03/16/2020 11:12 AM   Modules accepted: Orders

## 2020-03-17 NOTE — Progress Notes (Signed)
Internal Medicine Clinic Attending  Case discussed with Dr. Collene Gobble at the time of the visit.  We reviewed the resident's history and exam and pertinent patient test results.  I agree with the assessment, diagnosis, and plan of care documented in the resident's note.  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 Hypertension continues to be poorly controlled, possibly due to volume overload, may need to initiate dialysis, but will defer to nephrology. Appreciate Dr. Collene Gobble following up with him.   Lenice Pressman, M.D., Ph.D.

## 2020-03-21 DIAGNOSIS — Z79899 Other long term (current) drug therapy: Secondary | ICD-10-CM | POA: Diagnosis not present

## 2020-03-21 DIAGNOSIS — R7303 Prediabetes: Secondary | ICD-10-CM | POA: Diagnosis not present

## 2020-03-21 DIAGNOSIS — I129 Hypertensive chronic kidney disease with stage 1 through stage 4 chronic kidney disease, or unspecified chronic kidney disease: Secondary | ICD-10-CM | POA: Diagnosis not present

## 2020-03-21 DIAGNOSIS — M109 Gout, unspecified: Secondary | ICD-10-CM | POA: Diagnosis not present

## 2020-03-21 DIAGNOSIS — Z720 Tobacco use: Secondary | ICD-10-CM | POA: Diagnosis not present

## 2020-03-21 DIAGNOSIS — N2581 Secondary hyperparathyroidism of renal origin: Secondary | ICD-10-CM | POA: Diagnosis not present

## 2020-03-21 DIAGNOSIS — Z940 Kidney transplant status: Secondary | ICD-10-CM | POA: Diagnosis not present

## 2020-03-29 ENCOUNTER — Encounter: Payer: Medicare Other | Admitting: Pharmacist

## 2020-05-07 ENCOUNTER — Encounter (HOSPITAL_COMMUNITY): Payer: Self-pay

## 2020-05-07 ENCOUNTER — Ambulatory Visit (HOSPITAL_COMMUNITY)
Admission: EM | Admit: 2020-05-07 | Discharge: 2020-05-07 | Disposition: A | Discharge status: Home / Self Care | Payer: Medicare Other | Attending: Emergency Medicine | Admitting: Emergency Medicine

## 2020-05-07 ENCOUNTER — Other Ambulatory Visit: Payer: Self-pay

## 2020-05-07 DIAGNOSIS — Z5189 Encounter for other specified aftercare: Secondary | ICD-10-CM | POA: Diagnosis not present

## 2020-05-07 DIAGNOSIS — S81801D Unspecified open wound, right lower leg, subsequent encounter: Secondary | ICD-10-CM | POA: Diagnosis not present

## 2020-05-07 DIAGNOSIS — J029 Acute pharyngitis, unspecified: Secondary | ICD-10-CM | POA: Diagnosis not present

## 2020-05-07 DIAGNOSIS — Z113 Encounter for screening for infections with a predominantly sexual mode of transmission: Secondary | ICD-10-CM | POA: Insufficient documentation
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
## 2020-05-07 NOTE — Discharge Instructions (Signed)
Sore throat:  You can take Tylenol and/or Ibuprofen as needed for fever reduction and pain relief.   For sore throat: try warm salt water gargles, cepacol lozenges, throat spray, warm tea or water with lemon/honey, popsicles or ice, or OTC cold relief medicine for throat discomfort.    It is important to stay hydrated: drink plenty of fluids (water, gatorade/powerade/pedialyte, juices, or teas) to keep your throat moisturized and help further relieve irritation/discomfort.   Leg wound:  Take the doxycycline twice a day for the next 10 days.   Apply a warm compress to your leg 3-4 times a day.   If you notice any worsening swelling, redness, pain, or develop a fever, go directly to the emergency department.    If the wound does not clear up after the antibiotics, follow up with your Primary Care provider or dermatology.   STD Screen:  We will contact you with the results from your lab work and any additional treatment.    Do not have sex while taking undergoing treatment for STI.  Make sure that all of your partners get tested and treated.   Use a condom or other barrier method for all sexual encounters.    Return or go to the Emergency Department if symptoms worsen or do not improve in the next few days.
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
## 2020-05-09 LAB — CYTOLOGY, (ORAL, ANAL, URETHRAL) ANCILLARY ONLY
Chlamydia: NEGATIVE
Comment: NEGATIVE
Comment: NEGATIVE
Comment: NORMAL
Neisseria Gonorrhea: NEGATIVE
Trichomonas: POSITIVE — AB

## 2020-05-10 ENCOUNTER — Telehealth (HOSPITAL_COMMUNITY): Payer: Self-pay | Admitting: Emergency Medicine

## 2020-05-10 DIAGNOSIS — R7303 Prediabetes: Secondary | ICD-10-CM | POA: Diagnosis not present

## 2020-05-10 DIAGNOSIS — Z720 Tobacco use: Secondary | ICD-10-CM | POA: Diagnosis not present

## 2020-05-10 DIAGNOSIS — L089 Local infection of the skin and subcutaneous tissue, unspecified: Secondary | ICD-10-CM | POA: Diagnosis not present

## 2020-05-10 DIAGNOSIS — Z940 Kidney transplant status: Secondary | ICD-10-CM | POA: Diagnosis not present

## 2020-05-10 DIAGNOSIS — Z79899 Other long term (current) drug therapy: Secondary | ICD-10-CM | POA: Diagnosis not present

## 2020-05-10 DIAGNOSIS — I129 Hypertensive chronic kidney disease with stage 1 through stage 4 chronic kidney disease, or unspecified chronic kidney disease: Secondary | ICD-10-CM | POA: Diagnosis not present

## 2020-05-10 DIAGNOSIS — M109 Gout, unspecified: Secondary | ICD-10-CM | POA: Diagnosis not present

## 2020-05-10 DIAGNOSIS — N2581 Secondary hyperparathyroidism of renal origin: Secondary | ICD-10-CM | POA: Diagnosis not present

## 2020-05-10 MED ORDER — METRONIDAZOLE 500 MG PO TABS: 500.0000 mg | ORAL_TABLET | Freq: Two times a day (BID) | ORAL | 0 refills | Status: DC
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
## 2020-06-26 ENCOUNTER — Other Ambulatory Visit: Payer: Self-pay | Admitting: Student

## 2020-06-26 DIAGNOSIS — I10 Essential (primary) hypertension: Secondary | ICD-10-CM

## 2020-08-09 ENCOUNTER — Encounter: Payer: Medicare Other | Admitting: Pharmacist

## 2020-08-09 ENCOUNTER — Other Ambulatory Visit: Payer: Self-pay

## 2020-08-09 ENCOUNTER — Ambulatory Visit (HOSPITAL_COMMUNITY)
Admission: EM | Admit: 2020-08-09 | Discharge: 2020-08-09 | Disposition: A | Discharge status: Home / Self Care | Payer: Medicaid Other | Attending: Emergency Medicine | Admitting: Emergency Medicine

## 2020-08-09 DIAGNOSIS — R369 Urethral discharge, unspecified: Secondary | ICD-10-CM | POA: Diagnosis present

## 2020-08-09 DIAGNOSIS — Z113 Encounter for screening for infections with a predominantly sexual mode of transmission: Secondary | ICD-10-CM | POA: Diagnosis present

## 2020-08-09 NOTE — ED Triage Notes (Addendum)
Pt is present today with penile pain. Pt describes the pain as a sharp that started in may. Pt states that he finished the antibiotic but is still having some discomfort.

## 2020-08-09 NOTE — ED Provider Notes (Signed)
[place]    CSN: [id number] Arrival date & time: 08/09/20  1529      History   Chief Complaint Chief Complaint  Patient presents with   Medication Refill   Penile Discharge    HPI Christopher Chapman is a 65 y.o. male.   Patient here of evaluation of penile discharge.  Reports testing positive for trichomonas in April and took Flagyl.  Reports having sex while undergoing treatment and denies condom use.  Concern that he is re-infected.  Denies any trauma, injury, or other precipitating event.  Denies any specific alleviating or aggravating factors.  Denies any fevers, chest pain, shortness of breath, N/V/D, numbness, tingling, weakness, abdominal pain, or headaches.    The history is provided by the patient.  Medication Refill Penile Discharge   Past Medical History:  Diagnosis Date   Anemia    Arthritis    CRF (chronic renal failure)    ESRD (end stage renal disease) ([place])    Family history of anesthesia complication    [place] going to sleep.   Gout    Hx of cocaine abuse ([place])    Hyperlipidemia    Hypertension    Hyperuricemia    MRSA (methicillin resistant staph aureus) culture positive 07/2011   Parathyroid disease ([place])    Hx of , No meds at present   Tobacco abuse    Tuberculosis 1985   Treated with imeds for a years.    Patient Active Problem List   Diagnosis Date Noted   Type II diabetes mellitus ([place]) 03/15/2020   CMV (cytomegalovirus infection) ([place]) 01/29/2017   Kidney replaced by transplant 10/04/2016   Immunosuppression ([place]) 09/24/2016   History of gout 05/14/2016   Carotid stenosis, bilateral 05/14/2016   Routine adult health maintenance 05/14/2016   Dyslipidemia 01/14/2013   History of cocaine use 01/14/2013   End stage renal disease ([place]) 06/27/2011   Essential hypertension 08/19/2006   HYPERPARATHYROIDISM, SECONDARY 08/19/2006   ALCOHOL ABUSE, HX OF 08/19/2006    Past Surgical History:  Procedure Laterality Date    ANGIOPLASTY  08/09/2011   Procedure: ANGIOPLASTY;  Surgeon: Angelia Mould, MD;  Location: [facility]URO ORS;  Service: Vascular;  Laterality: Left;  Left Braciocephalic Fistula using Vascu-Guard Patch   [place] Left 2010   INCISION AND DRAINAGE  12/2012   sebacebus cyst, infected   MANDIBLE FRACTURE SURGERY     [name] N/A 07/16/2011   Procedure: Earney Mallet;  Surgeon: Angelia Mould, MD;  Location: [facility] CATH LAB;  Service: Cardiovascular;  Laterality: N/A;       Home Medications    Prior to Admission medications   Medication Sig Start Date End Date Taking? Authorizing Provider  atorvastatin (LIPITOR) 40 MG tablet Take 1 tablet (40 mg total) by mouth daily. 03/16/20 03/16/21  [name], MD  ACCU-CHEK AVIVA PLUS test strip USE 1 STRIP DAILY TO TEST BLOOD SUGAR 11/15/16   [provider]  allopurinol (ZYLOPRIM) 100 MG tablet Take 200 mg by mouth daily.  05/18/11   [provider]  amLODipine (NORVASC) 5 MG tablet Take 1 tablet (5 mg total) by mouth daily. 03/15/20 03/15/21  [name], MD  ASPIRIN LOW DOSE 81 MG EC tablet TAKE 1 TABLET(81 MG) BY MOUTH DAILY. SWALLOW WHOLE 06/27/20   [name], MD  Blood Glucose Monitoring Suppl (ACCU-CHEK AVIVA PLUS) w/Device KIT USE AS INSTRUCTED; PLEASE CHECK BLOOD SUGAR ONCE A DAY 1 2 HRS AFTER MEALS 11/15/16  [provider]  labetalol (NORMODYNE) 300 MG tablet Take 1 tablet (300 mg total) by mouth 3 (three) times daily. 03/15/20   [name], MD  Lancets (ACCU-CHEK MULTICLIX) lancets Inject into the skin. 12/27/16   [provider]  metroNIDAZOLE (FLAGYL) 500 MG tablet Take 1 tablet (500 mg total) by mouth 2 (two) times daily. 05/10/20   [name], MD  mupirocin ointment (BACTROBAN) 2 % Apply 1 application topically 3 (three) times daily. 10/03/17   [name], MD  mycophenolate (MYFORTIC) 180 MG EC tablet Take 360 mg by mouth 2 (two) times daily.  07/03/17   [provider]  tacrolimus (PROGRAF) 1 MG capsule Take 4 mg by mouth 2 (two) times daily.  01/24/17   [provider]    Family History Family History  Problem Relation Age of Onset   Diabetes Father    Heart disease Father    Hypertension Father    Other Father        amputation   Healthy Mother    Diabetes Sister    Heart disease Sister    Colon cancer Neg Hx    Stomach cancer Neg Hx    Rectal cancer Neg Hx    Esophageal cancer Neg Hx    Liver cancer Neg Hx    Colon polyps Neg Hx     Social History Social History   Tobacco Use   Smoking status: Every Day    Packs/day: 0.10    Years: 30.00    Pack years: 3.00    Types: Cigarettes    Start date: 02/15/2016   Smokeless tobacco: Never   Tobacco comments:    Quit in Feb.  Substance Use Topics   Alcohol use: Yes    Comment: occasional   Drug use: No    Frequency: 4.0 times per week     Allergies   Patient has no known allergies.   Review of Systems Review of Systems  Genitourinary:  Positive for penile discharge.  All other systems reviewed and are negative.   Physical Exam Triage Vital Signs ED Triage Vitals  Enc Vitals Group     BP 08/09/20 1640 (!) 176/83     Pulse Rate 08/09/20 1640 80     Resp 08/09/20 1640 18     Temp 08/09/20 1640 98.4 F (36.9 C)     Temp Source 08/09/20 1640 Oral     SpO2 08/09/20 1640 98 %     Weight --      Height --      Head Circumference --      Peak Flow --      Pain Score 08/09/20 1638 4     Pain Loc --      Pain Edu? --      Excl. in [place]? --    No data found.  Updated Vital Signs BP (!) 176/83   Pulse 80   Temp 98.4 F (36.9 C) (Oral)   Resp 18   SpO2 98%   Visual Acuity Right Eye Distance:   Left Eye Distance:   Bilateral Distance:    Right Eye Near:   Left Eye Near:    Bilateral Near:     Physical Exam Vitals and nursing note reviewed.  Constitutional:      General: He is not in acute distress.    Appearance:  Normal appearance. He is not ill-appearing, toxic-appearing or diaphoretic.  HENT:     Head: Normocephalic and atraumatic.  Eyes:  Conjunctiva/sclera: Conjunctivae normal.  Cardiovascular:     Rate and Rhythm: Normal rate.     Pulses: Normal pulses.  Pulmonary:     Effort: Pulmonary effort is normal.  Abdominal:     General: Abdomen is flat.  Genitourinary:    Comments: declines Musculoskeletal:        General: Normal range of motion.     Cervical back: Normal range of motion.  Skin:    General: Skin is warm and dry.  Neurological:     General: No focal deficit present.     Mental Status: He is alert and oriented to person, place, and time.  Psychiatric:        Mood and Affect: Mood normal.     UC Treatments / Results  Labs (all labs ordered are listed, but only abnormal results are displayed) Labs Reviewed  CYTOLOGY, (ORAL, ANAL, URETHRAL) ANCILLARY ONLY    EKG   Radiology No results found.  Procedures Procedures (including critical care time)  Medications Ordered in UC Medications - No data to display  Initial Impression / Assessment and Plan / UC Course  I have reviewed the triage vital signs and the nursing notes.  Pertinent labs & imaging results that were available during my care of the patient were reviewed by me and considered in my medical decision making (see chart for details).    Assessment negative for red flags or concerns.  Self swab obtained and will treat based on results.  Discussed need to abstain from sex while undergoing treatment.  Discussed safe sex practices including condom or other barrier method use.  Follow up with primary care as needed.  Final Clinical Impressions(s) / UC Diagnoses   Final diagnoses:  Screen for STD (sexually transmitted disease)  Penile discharge     Discharge Instructions      We will contact you if the results from your lab work are positive and require additional treatment.    Do not have sex while  taking undergoing treatment for STI.  Make sure that all of your partners get tested and treated.   Use a condom or other barrier method for all sexual encounters.    Return or go to the Emergency Department if symptoms worsen or do not improve in the next few days.      ED Prescriptions   None    PDMP not reviewed this encounter.   Pearson Forster, NP 08/09/20 1700

## 2020-08-09 NOTE — Discharge Instructions (Addendum)
We will contact you if the results from your lab work are positive and require additional treatment.    Do not have sex while taking undergoing treatment for STI.  Make sure that all of your partners get tested and treated.   Use a condom or other barrier method for all sexual encounters.    Return or go to the Emergency Department if symptoms worsen or do not improve in the next few days.

## 2020-08-10 ENCOUNTER — Telehealth (HOSPITAL_COMMUNITY): Payer: Self-pay | Admitting: Emergency Medicine

## 2020-08-10 LAB — CYTOLOGY, (ORAL, ANAL, URETHRAL) ANCILLARY ONLY
Chlamydia: NEGATIVE
Comment: NEGATIVE
Comment: NEGATIVE
Comment: NORMAL
Neisseria Gonorrhea: NEGATIVE
Trichomonas: POSITIVE — AB

## 2020-08-10 MED ORDER — METRONIDAZOLE 500 MG PO TABS: 500.0000 mg | ORAL_TABLET | Freq: Two times a day (BID) | ORAL | 0 refills | Status: DC

## 2020-10-05 ENCOUNTER — Encounter (HOSPITAL_COMMUNITY)
Admission: EM | Disposition: A | Discharge status: Rehabilitation Facility | Payer: Self-pay | Source: Home / Self Care | Attending: Internal Medicine

## 2020-10-05 ENCOUNTER — Emergency Department (HOSPITAL_COMMUNITY): Payer: Medicaid Other | Admitting: Certified Registered Nurse Anesthetist

## 2020-10-05 ENCOUNTER — Encounter (HOSPITAL_COMMUNITY): Payer: Self-pay | Admitting: Anesthesiology

## 2020-10-05 ENCOUNTER — Inpatient Hospital Stay (HOSPITAL_COMMUNITY)
Admission: EM | Admit: 2020-10-05 | Discharge: 2020-10-17 | DRG: 025 | Disposition: A | Discharge status: Rehabilitation Facility | Payer: Medicaid Other | Attending: Internal Medicine | Admitting: Internal Medicine

## 2020-10-05 ENCOUNTER — Emergency Department (HOSPITAL_COMMUNITY): Payer: Medicaid Other

## 2020-10-05 DIAGNOSIS — M109 Gout, unspecified: Secondary | ICD-10-CM | POA: Diagnosis present

## 2020-10-05 DIAGNOSIS — M898X9 Other specified disorders of bone, unspecified site: Secondary | ICD-10-CM | POA: Diagnosis present

## 2020-10-05 DIAGNOSIS — Z8611 Personal history of tuberculosis: Secondary | ICD-10-CM

## 2020-10-05 DIAGNOSIS — E878 Other disorders of electrolyte and fluid balance, not elsewhere classified: Secondary | ICD-10-CM | POA: Diagnosis present

## 2020-10-05 DIAGNOSIS — Z23 Encounter for immunization: Secondary | ICD-10-CM | POA: Diagnosis not present

## 2020-10-05 DIAGNOSIS — N250 Renal osteodystrophy: Secondary | ICD-10-CM | POA: Diagnosis present

## 2020-10-05 DIAGNOSIS — S065XAA Traumatic subdural hemorrhage with loss of consciousness status unknown, initial encounter: Secondary | ICD-10-CM | POA: Diagnosis not present

## 2020-10-05 DIAGNOSIS — N186 End stage renal disease: Secondary | ICD-10-CM | POA: Diagnosis present

## 2020-10-05 DIAGNOSIS — R531 Weakness: Secondary | ICD-10-CM | POA: Diagnosis present

## 2020-10-05 DIAGNOSIS — N184 Chronic kidney disease, stage 4 (severe): Secondary | ICD-10-CM

## 2020-10-05 DIAGNOSIS — Z9889 Other specified postprocedural states: Secondary | ICD-10-CM

## 2020-10-05 DIAGNOSIS — E139 Other specified diabetes mellitus without complications: Secondary | ICD-10-CM | POA: Diagnosis not present

## 2020-10-05 DIAGNOSIS — Z833 Family history of diabetes mellitus: Secondary | ICD-10-CM

## 2020-10-05 DIAGNOSIS — D691 Qualitative platelet defects: Secondary | ICD-10-CM | POA: Diagnosis present

## 2020-10-05 DIAGNOSIS — G8194 Hemiplegia, unspecified affecting left nondominant side: Secondary | ICD-10-CM | POA: Diagnosis present

## 2020-10-05 DIAGNOSIS — I161 Hypertensive emergency: Secondary | ICD-10-CM | POA: Diagnosis present

## 2020-10-05 DIAGNOSIS — M7989 Other specified soft tissue disorders: Secondary | ICD-10-CM | POA: Diagnosis not present

## 2020-10-05 DIAGNOSIS — Y830 Surgical operation with transplant of whole organ as the cause of abnormal reaction of the patient, or of later complication, without mention of misadventure at the time of the procedure: Secondary | ICD-10-CM | POA: Diagnosis present

## 2020-10-05 DIAGNOSIS — R4182 Altered mental status, unspecified: Principal | ICD-10-CM

## 2020-10-05 DIAGNOSIS — S065X9A Traumatic subdural hemorrhage with loss of consciousness of unspecified duration, initial encounter: Secondary | ICD-10-CM

## 2020-10-05 DIAGNOSIS — Z8249 Family history of ischemic heart disease and other diseases of the circulatory system: Secondary | ICD-10-CM

## 2020-10-05 DIAGNOSIS — Z992 Dependence on renal dialysis: Secondary | ICD-10-CM | POA: Diagnosis not present

## 2020-10-05 DIAGNOSIS — T8612 Kidney transplant failure: Secondary | ICD-10-CM | POA: Diagnosis present

## 2020-10-05 DIAGNOSIS — Z20822 Contact with and (suspected) exposure to covid-19: Secondary | ICD-10-CM | POA: Diagnosis present

## 2020-10-05 DIAGNOSIS — D631 Anemia in chronic kidney disease: Secondary | ICD-10-CM | POA: Diagnosis present

## 2020-10-05 DIAGNOSIS — M1990 Unspecified osteoarthritis, unspecified site: Secondary | ICD-10-CM | POA: Diagnosis present

## 2020-10-05 DIAGNOSIS — Z794 Long term (current) use of insulin: Secondary | ICD-10-CM | POA: Diagnosis not present

## 2020-10-05 DIAGNOSIS — E1122 Type 2 diabetes mellitus with diabetic chronic kidney disease: Secondary | ICD-10-CM | POA: Diagnosis present

## 2020-10-05 DIAGNOSIS — I6523 Occlusion and stenosis of bilateral carotid arteries: Secondary | ICD-10-CM | POA: Diagnosis present

## 2020-10-05 DIAGNOSIS — D62 Acute posthemorrhagic anemia: Secondary | ICD-10-CM | POA: Diagnosis present

## 2020-10-05 DIAGNOSIS — E785 Hyperlipidemia, unspecified: Secondary | ICD-10-CM | POA: Diagnosis present

## 2020-10-05 DIAGNOSIS — Z638 Other specified problems related to primary support group: Secondary | ICD-10-CM

## 2020-10-05 DIAGNOSIS — W19XXXA Unspecified fall, initial encounter: Secondary | ICD-10-CM | POA: Diagnosis not present

## 2020-10-05 DIAGNOSIS — R609 Edema, unspecified: Secondary | ICD-10-CM | POA: Diagnosis not present

## 2020-10-05 DIAGNOSIS — E874 Mixed disorder of acid-base balance: Secondary | ICD-10-CM | POA: Diagnosis present

## 2020-10-05 DIAGNOSIS — I10 Essential (primary) hypertension: Secondary | ICD-10-CM | POA: Diagnosis not present

## 2020-10-05 DIAGNOSIS — Z8614 Personal history of Methicillin resistant Staphylococcus aureus infection: Secondary | ICD-10-CM

## 2020-10-05 DIAGNOSIS — D649 Anemia, unspecified: Secondary | ICD-10-CM | POA: Diagnosis not present

## 2020-10-05 DIAGNOSIS — R410 Disorientation, unspecified: Secondary | ICD-10-CM

## 2020-10-05 DIAGNOSIS — N2581 Secondary hyperparathyroidism of renal origin: Secondary | ICD-10-CM | POA: Diagnosis present

## 2020-10-05 DIAGNOSIS — F1010 Alcohol abuse, uncomplicated: Secondary | ICD-10-CM | POA: Diagnosis present

## 2020-10-05 DIAGNOSIS — I6202 Nontraumatic subacute subdural hemorrhage: Principal | ICD-10-CM | POA: Diagnosis present

## 2020-10-05 DIAGNOSIS — R7303 Prediabetes: Secondary | ICD-10-CM | POA: Diagnosis not present

## 2020-10-05 DIAGNOSIS — Z91199 Patient's noncompliance with other medical treatment and regimen due to unspecified reason: Secondary | ICD-10-CM

## 2020-10-05 DIAGNOSIS — S065XAD Traumatic subdural hemorrhage with loss of consciousness status unknown, subsequent encounter: Secondary | ICD-10-CM | POA: Diagnosis not present

## 2020-10-05 DIAGNOSIS — F17210 Nicotine dependence, cigarettes, uncomplicated: Secondary | ICD-10-CM | POA: Diagnosis present

## 2020-10-05 DIAGNOSIS — I120 Hypertensive chronic kidney disease with stage 5 chronic kidney disease or end stage renal disease: Secondary | ICD-10-CM | POA: Diagnosis present

## 2020-10-05 DIAGNOSIS — S0990XA Unspecified injury of head, initial encounter: Secondary | ICD-10-CM

## 2020-10-05 DIAGNOSIS — F1910 Other psychoactive substance abuse, uncomplicated: Secondary | ICD-10-CM | POA: Diagnosis not present

## 2020-10-05 DIAGNOSIS — I6200 Nontraumatic subdural hemorrhage, unspecified: Secondary | ICD-10-CM

## 2020-10-05 DIAGNOSIS — E1121 Type 2 diabetes mellitus with diabetic nephropathy: Secondary | ICD-10-CM

## 2020-10-05 DIAGNOSIS — E875 Hyperkalemia: Secondary | ICD-10-CM | POA: Diagnosis present

## 2020-10-05 DIAGNOSIS — D72828 Other elevated white blood cell count: Secondary | ICD-10-CM | POA: Diagnosis present

## 2020-10-05 DIAGNOSIS — R7989 Other specified abnormal findings of blood chemistry: Secondary | ICD-10-CM | POA: Diagnosis present

## 2020-10-05 HISTORY — PX: CRANIOTOMY: SHX93

## 2020-10-05 LAB — CBC WITH DIFFERENTIAL/PLATELET
Abs Immature Granulocytes: 0.04 10*3/uL (ref 0.00–0.07)
Basophils Absolute: 0 10*3/uL (ref 0.0–0.1)
Basophils Relative: 0 %
Eosinophils Absolute: 0.1 10*3/uL (ref 0.0–0.5)
Eosinophils Relative: 1 %
HCT: 33.1 % — ABNORMAL LOW (ref 39.0–52.0)
Hemoglobin: 9.6 g/dL — ABNORMAL LOW (ref 13.0–17.0)
Immature Granulocytes: 0 %
Lymphocytes Relative: 14 %
Lymphs Abs: 1.5 10*3/uL (ref 0.7–4.0)
MCH: 26.7 pg (ref 26.0–34.0)
MCHC: 29 g/dL — ABNORMAL LOW (ref 30.0–36.0)
MCV: 92.2 fL (ref 80.0–100.0)
Monocytes Absolute: 0.7 10*3/uL (ref 0.1–1.0)
Monocytes Relative: 7 %
Neutro Abs: 8 10*3/uL — ABNORMAL HIGH (ref 1.7–7.7)
Neutrophils Relative %: 78 %
Platelets: 233 10*3/uL (ref 150–400)
RBC: 3.59 MIL/uL — ABNORMAL LOW (ref 4.22–5.81)
RDW: 17.2 % — ABNORMAL HIGH (ref 11.5–15.5)
WBC: 10.4 10*3/uL (ref 4.0–10.5)
nRBC: 0 % (ref 0.0–0.2)

## 2020-10-05 LAB — POCT I-STAT 7, (LYTES, BLD GAS, ICA,H+H)
Acid-base deficit: 12 mmol/L — ABNORMAL HIGH (ref 0.0–2.0)
Acid-base deficit: 9 mmol/L — ABNORMAL HIGH (ref 0.0–2.0)
Bicarbonate: 15.9 mmol/L — ABNORMAL LOW (ref 20.0–28.0)
Bicarbonate: 17.7 mmol/L — ABNORMAL LOW (ref 20.0–28.0)
Calcium, Ion: 0.97 mmol/L — ABNORMAL LOW (ref 1.15–1.40)
Calcium, Ion: 0.99 mmol/L — ABNORMAL LOW (ref 1.15–1.40)
HCT: 21 % — ABNORMAL LOW (ref 39.0–52.0)
HCT: 27 % — ABNORMAL LOW (ref 39.0–52.0)
Hemoglobin: 7.1 g/dL — ABNORMAL LOW (ref 13.0–17.0)
Hemoglobin: 9.2 g/dL — ABNORMAL LOW (ref 13.0–17.0)
O2 Saturation: 100 %
O2 Saturation: 100 %
Patient temperature: 35.5
Patient temperature: 36
Potassium: 4.8 mmol/L (ref 3.5–5.1)
Potassium: 5.2 mmol/L — ABNORMAL HIGH (ref 3.5–5.1)
Sodium: 144 mmol/L (ref 135–145)
Sodium: 146 mmol/L — ABNORMAL HIGH (ref 135–145)
TCO2: 17 mmol/L — ABNORMAL LOW (ref 22–32)
TCO2: 19 mmol/L — ABNORMAL LOW (ref 22–32)
pCO2 arterial: 40.1 mmHg (ref 32.0–48.0)
pCO2 arterial: 40.3 mmHg (ref 32.0–48.0)
pH, Arterial: 7.2 — ABNORMAL LOW (ref 7.350–7.450)
pH, Arterial: 7.243 — ABNORMAL LOW (ref 7.350–7.450)
pO2, Arterial: 519 mmHg — ABNORMAL HIGH (ref 83.0–108.0)
pO2, Arterial: 534 mmHg — ABNORMAL HIGH (ref 83.0–108.0)

## 2020-10-05 LAB — TROPONIN I (HIGH SENSITIVITY): Troponin I (High Sensitivity): 66 ng/L — ABNORMAL HIGH (ref <18)

## 2020-10-05 LAB — PROTIME-INR
INR: 1.1 (ref 0.8–1.2)
Prothrombin Time: 14.4 seconds (ref 11.4–15.2)

## 2020-10-05 LAB — AMMONIA: Ammonia: 37 umol/L — ABNORMAL HIGH (ref 9–35)

## 2020-10-05 LAB — LACTIC ACID, PLASMA
Lactic Acid, Venous: 1.2 mmol/L (ref 0.5–1.9)
Lactic Acid, Venous: 1 mmol/L (ref 0.5–1.9)

## 2020-10-05 LAB — RESP PANEL BY RT-PCR (FLU A&B, COVID) ARPGX2
Influenza A by PCR: NEGATIVE
Influenza B by PCR: NEGATIVE
SARS Coronavirus 2 by RT PCR: NEGATIVE

## 2020-10-05 LAB — COMPREHENSIVE METABOLIC PANEL
ALT: 13 U/L (ref 0–44)
AST: 16 U/L (ref 15–41)
Albumin: 3 g/dL — ABNORMAL LOW (ref 3.5–5.0)
Alkaline Phosphatase: 70 U/L (ref 38–126)
Anion gap: 16 — ABNORMAL HIGH (ref 5–15)
BUN: 102 mg/dL — ABNORMAL HIGH (ref 8–23)
CO2: 13 mmol/L — ABNORMAL LOW (ref 22–32)
Calcium: 7.5 mg/dL — ABNORMAL LOW (ref 8.9–10.3)
Chloride: 111 mmol/L (ref 98–111)
Creatinine, Ser: 17.95 mg/dL — ABNORMAL HIGH (ref 0.61–1.24)
GFR, Estimated: 3 mL/min — ABNORMAL LOW (ref >60)
Glucose, Bld: 93 mg/dL (ref 70–99)
Potassium: 5.4 mmol/L — ABNORMAL HIGH (ref 3.5–5.1)
Sodium: 140 mmol/L (ref 135–145)
Total Bilirubin: 0.9 mg/dL (ref 0.3–1.2)
Total Protein: 7.4 g/dL (ref 6.5–8.1)

## 2020-10-05 LAB — GLUCOSE, CAPILLARY
Glucose-Capillary: 121 mg/dL — ABNORMAL HIGH (ref 70–99)
Glucose-Capillary: 109 mg/dL — ABNORMAL HIGH (ref 70–99)

## 2020-10-05 LAB — ABO/RH: ABO/RH(D): AB POS

## 2020-10-05 LAB — CK: Total CK: 511 U/L — ABNORMAL HIGH (ref 49–397)

## 2020-10-05 LAB — LIPASE, BLOOD: Lipase: 49 U/L (ref 11–51)

## 2020-10-05 LAB — TSH: TSH: 0.922 u[IU]/mL (ref 0.350–4.500)

## 2020-10-05 LAB — PREPARE RBC (CROSSMATCH)

## 2020-10-05 SURGERY — CRANIOTOMY HEMATOMA EVACUATION SUBDURAL
Anesthesia: General | Site: Head | Laterality: Right

## 2020-10-05 MED ORDER — MIDAZOLAM HCL 2 MG/2ML IJ SOLN: 0.5000 mg | Freq: Once | INTRAMUSCULAR | Status: DC | PRN

## 2020-10-05 MED ORDER — PROPOFOL 10 MG/ML IV BOLUS
INTRAVENOUS | Status: AC
  Filled 2020-10-05: qty 20

## 2020-10-05 MED ORDER — POLYETHYLENE GLYCOL 3350 17 G PO PACK
17.0000 g | PACK | Freq: Every day | ORAL | Status: DC | PRN
Start: 2020-10-05 — End: 2020-10-08

## 2020-10-05 MED ORDER — HEMOSTATIC AGENTS (NO CHARGE) OPTIME
TOPICAL | Status: DC | PRN
  Administered 2020-10-05: 1 via TOPICAL

## 2020-10-05 MED ORDER — AMLODIPINE BESYLATE 5 MG PO TABS
5.0000 mg | ORAL_TABLET | Freq: Every day | ORAL | Status: DC
  Administered 2020-10-06 – 2020-10-17 (×12): 5 mg via ORAL
  Filled 2020-10-05 (×12): qty 1

## 2020-10-05 MED ORDER — FENTANYL CITRATE (PF) 100 MCG/2ML IJ SOLN: 25.0000 ug | INTRAMUSCULAR | Status: DC | PRN

## 2020-10-05 MED ORDER — ROCURONIUM BROMIDE 10 MG/ML (PF) SYRINGE
PREFILLED_SYRINGE | INTRAVENOUS | Status: DC | PRN
  Administered 2020-10-05: 50 mg via INTRAVENOUS

## 2020-10-05 MED ORDER — THROMBIN 20000 UNITS EX SOLR
CUTANEOUS | Status: DC | PRN
  Administered 2020-10-05: 20 mL via TOPICAL

## 2020-10-05 MED ORDER — MORPHINE SULFATE (PF) 2 MG/ML IV SOLN
1.0000 mg | INTRAVENOUS | Status: DC | PRN
  Administered 2020-10-06: 2 mg via INTRAVENOUS
  Filled 2020-10-05: qty 1

## 2020-10-05 MED ORDER — HYDROCODONE-ACETAMINOPHEN 5-325 MG PO TABS
1.0000 | ORAL_TABLET | ORAL | Status: DC | PRN
  Administered 2020-10-06 – 2020-10-14 (×4): 1 via ORAL
  Filled 2020-10-05 (×4): qty 1

## 2020-10-05 MED ORDER — SUCCINYLCHOLINE CHLORIDE 200 MG/10ML IV SOSY
PREFILLED_SYRINGE | INTRAVENOUS | Status: AC
  Filled 2020-10-05: qty 10

## 2020-10-05 MED ORDER — CEFAZOLIN SODIUM 1 G IJ SOLR
INTRAMUSCULAR | Status: AC
  Filled 2020-10-05: qty 20

## 2020-10-05 MED ORDER — PROPOFOL 10 MG/ML IV BOLUS
INTRAVENOUS | Status: DC | PRN
  Administered 2020-10-05: 200 mg via INTRAVENOUS

## 2020-10-05 MED ORDER — PHENYLEPHRINE 40 MCG/ML (10ML) SYRINGE FOR IV PUSH (FOR BLOOD PRESSURE SUPPORT)
PREFILLED_SYRINGE | INTRAVENOUS | Status: AC
  Filled 2020-10-05: qty 10

## 2020-10-05 MED ORDER — MENTHOL 3 MG MT LOZG
1.0000 | LOZENGE | OROMUCOSAL | Status: DC | PRN
  Administered 2020-10-05: 3 mg via ORAL
  Filled 2020-10-05: qty 9

## 2020-10-05 MED ORDER — INSULIN ASPART 100 UNIT/ML IJ SOLN
0.0000 [IU] | Freq: Three times a day (TID) | INTRAMUSCULAR | Status: DC
  Administered 2020-10-06 – 2020-10-07 (×3): 2 [IU] via SUBCUTANEOUS
  Administered 2020-10-07: 3 [IU] via SUBCUTANEOUS
  Administered 2020-10-07: 2 [IU] via SUBCUTANEOUS
  Administered 2020-10-08 (×2): 3 [IU] via SUBCUTANEOUS
  Administered 2020-10-08 – 2020-10-09 (×2): 2 [IU] via SUBCUTANEOUS
  Administered 2020-10-09 – 2020-10-11 (×5): 3 [IU] via SUBCUTANEOUS
  Administered 2020-10-11 – 2020-10-12 (×3): 2 [IU] via SUBCUTANEOUS
  Administered 2020-10-13: 3 [IU] via SUBCUTANEOUS
  Administered 2020-10-13 – 2020-10-15 (×3): 2 [IU] via SUBCUTANEOUS
  Administered 2020-10-15: 3 [IU] via SUBCUTANEOUS
  Administered 2020-10-16: 2 [IU] via SUBCUTANEOUS

## 2020-10-05 MED ORDER — LABETALOL HCL 5 MG/ML IV SOLN
10.0000 mg | INTRAVENOUS | Status: DC | PRN
Start: 2020-10-05 — End: 2020-10-08

## 2020-10-05 MED ORDER — SODIUM CHLORIDE 0.9 % IV SOLN: INTRAVENOUS | Status: DC

## 2020-10-05 MED ORDER — SODIUM BICARBONATE 8.4 % IV SOLN
INTRAVENOUS | Status: AC
  Filled 2020-10-05: qty 50

## 2020-10-05 MED ORDER — MEPERIDINE HCL 25 MG/ML IJ SOLN: 6.2500 mg | INTRAMUSCULAR | Status: DC | PRN

## 2020-10-05 MED ORDER — CEFAZOLIN SODIUM-DEXTROSE 2-3 GM-%(50ML) IV SOLR
INTRAVENOUS | Status: DC | PRN
  Administered 2020-10-05: 2 g via INTRAVENOUS

## 2020-10-05 MED ORDER — ACETAMINOPHEN 650 MG RE SUPP: 650.0000 mg | RECTAL | Status: DC | PRN

## 2020-10-05 MED ORDER — THROMBIN 5000 UNITS EX SOLR
OROMUCOSAL | Status: DC | PRN
  Administered 2020-10-05: 5 mL via TOPICAL

## 2020-10-05 MED ORDER — EPHEDRINE 5 MG/ML INJ
INTRAVENOUS | Status: AC
  Filled 2020-10-05: qty 5

## 2020-10-05 MED ORDER — ONDANSETRON HCL 4 MG/2ML IJ SOLN
4.0000 mg | INTRAMUSCULAR | Status: DC | PRN
  Administered 2020-10-06: 4 mg via INTRAVENOUS
  Filled 2020-10-05: qty 2

## 2020-10-05 MED ORDER — CHLORHEXIDINE GLUCONATE 0.12 % MT SOLN
OROMUCOSAL | Status: AC
  Administered 2020-10-05: 15 mL via OROMUCOSAL
  Filled 2020-10-05: qty 15

## 2020-10-05 MED ORDER — OXYCODONE HCL 5 MG PO TABS: 5.0000 mg | ORAL_TABLET | Freq: Once | ORAL | Status: DC | PRN

## 2020-10-05 MED ORDER — CLEVIDIPINE BUTYRATE 0.5 MG/ML IV EMUL
INTRAVENOUS | Status: AC
  Filled 2020-10-05: qty 50

## 2020-10-05 MED ORDER — CHLORHEXIDINE GLUCONATE 0.12 % MT SOLN: 15.0000 mL | Freq: Once | OROMUCOSAL | Status: AC

## 2020-10-05 MED ORDER — SODIUM CHLORIDE 0.9 % IV BOLUS
250.0000 mL | Freq: Once | INTRAVENOUS | Status: AC
  Administered 2020-10-05: 250 mL via INTRAVENOUS

## 2020-10-05 MED ORDER — LABETALOL HCL 5 MG/ML IV SOLN
INTRAVENOUS | Status: DC | PRN
  Administered 2020-10-05 (×3): 5 mg via INTRAVENOUS
  Administered 2020-10-05: 10 mg via INTRAVENOUS
  Administered 2020-10-05: 5 mg via INTRAVENOUS

## 2020-10-05 MED ORDER — SUGAMMADEX SODIUM 200 MG/2ML IV SOLN
INTRAVENOUS | Status: DC | PRN
  Administered 2020-10-05: 200 mg via INTRAVENOUS

## 2020-10-05 MED ORDER — DEXAMETHASONE SODIUM PHOSPHATE 4 MG/ML IJ SOLN
4.0000 mg | Freq: Four times a day (QID) | INTRAMUSCULAR | Status: AC
  Administered 2020-10-07 (×4): 4 mg via INTRAVENOUS
  Filled 2020-10-05 (×3): qty 1

## 2020-10-05 MED ORDER — ORAL CARE MOUTH RINSE: 15.0000 mL | Freq: Once | OROMUCOSAL | Status: AC

## 2020-10-05 MED ORDER — SUCCINYLCHOLINE CHLORIDE 200 MG/10ML IV SOSY
PREFILLED_SYRINGE | INTRAVENOUS | Status: DC | PRN
  Administered 2020-10-05: 120 mg via INTRAVENOUS

## 2020-10-05 MED ORDER — LIDOCAINE-EPINEPHRINE 2 %-1:100000 IJ SOLN
INTRAMUSCULAR | Status: DC | PRN
  Administered 2020-10-05: 10 mL via INTRADERMAL

## 2020-10-05 MED ORDER — OXYCODONE HCL 5 MG/5ML PO SOLN: 5.0000 mg | Freq: Once | ORAL | Status: DC | PRN

## 2020-10-05 MED ORDER — FENTANYL CITRATE (PF) 250 MCG/5ML IJ SOLN
INTRAMUSCULAR | Status: AC
  Filled 2020-10-05: qty 5

## 2020-10-05 MED ORDER — SODIUM CHLORIDE 0.9% IV SOLUTION: Freq: Once | INTRAVENOUS | Status: DC

## 2020-10-05 MED ORDER — ALLOPURINOL 100 MG PO TABS
200.0000 mg | ORAL_TABLET | Freq: Every day | ORAL | Status: DC
  Administered 2020-10-06 – 2020-10-17 (×12): 200 mg via ORAL
  Filled 2020-10-05 (×12): qty 2

## 2020-10-05 MED ORDER — HYDRALAZINE HCL 20 MG/ML IJ SOLN
10.0000 mg | INTRAMUSCULAR | Status: DC | PRN
  Filled 2020-10-05: qty 2

## 2020-10-05 MED ORDER — PHENYLEPHRINE HCL-NACL 20-0.9 MG/250ML-% IV SOLN
INTRAVENOUS | Status: DC | PRN
  Administered 2020-10-05: 30 ug/min via INTRAVENOUS

## 2020-10-05 MED ORDER — LIDOCAINE 2% (20 MG/ML) 5 ML SYRINGE
INTRAMUSCULAR | Status: DC | PRN
  Administered 2020-10-05: 20 mg via INTRAVENOUS

## 2020-10-05 MED ORDER — CLEVIDIPINE BUTYRATE 0.5 MG/ML IV EMUL
0.0000 mg/h | INTRAVENOUS | Status: DC
  Administered 2020-10-05: 8 mg/h via INTRAVENOUS
  Administered 2020-10-06: 14 mg/h via INTRAVENOUS
  Administered 2020-10-06: 8 mg/h via INTRAVENOUS
  Administered 2020-10-06: 14 mg/h via INTRAVENOUS
  Filled 2020-10-05 (×4): qty 100

## 2020-10-05 MED ORDER — DOCUSATE SODIUM 100 MG PO CAPS
100.0000 mg | ORAL_CAPSULE | Freq: Two times a day (BID) | ORAL | Status: DC | PRN
  Administered 2020-10-16 – 2020-10-17 (×2): 100 mg via ORAL
  Filled 2020-10-05 (×2): qty 1

## 2020-10-05 MED ORDER — SODIUM BICARBONATE 8.4 % IV SOLN
INTRAVENOUS | Status: DC | PRN
  Administered 2020-10-05: 50 meq via INTRAVENOUS

## 2020-10-05 MED ORDER — LIDOCAINE HCL (PF) 2 % IJ SOLN
INTRAMUSCULAR | Status: AC
  Filled 2020-10-05: qty 5

## 2020-10-05 MED ORDER — 0.9 % SODIUM CHLORIDE (POUR BTL) OPTIME
TOPICAL | Status: DC | PRN
  Administered 2020-10-05 (×3): 1000 mL

## 2020-10-05 MED ORDER — ONDANSETRON HCL 4 MG/2ML IJ SOLN
INTRAMUSCULAR | Status: AC
  Filled 2020-10-05: qty 2

## 2020-10-05 MED ORDER — CEFAZOLIN SODIUM-DEXTROSE 1-4 GM/50ML-% IV SOLN
1.0000 g | Freq: Three times a day (TID) | INTRAVENOUS | Status: DC
Start: 2020-10-05 — End: 2020-10-05

## 2020-10-05 MED ORDER — LABETALOL HCL 100 MG PO TABS
300.0000 mg | ORAL_TABLET | Freq: Three times a day (TID) | ORAL | Status: DC
  Administered 2020-10-06 – 2020-10-17 (×29): 300 mg via ORAL
  Filled 2020-10-05 (×4): qty 1
  Filled 2020-10-05: qty 3
  Filled 2020-10-05: qty 1
  Filled 2020-10-05 (×5): qty 3
  Filled 2020-10-05: qty 1
  Filled 2020-10-05 (×2): qty 3
  Filled 2020-10-05 (×5): qty 1
  Filled 2020-10-05: qty 3
  Filled 2020-10-05 (×2): qty 1
  Filled 2020-10-05 (×4): qty 3
  Filled 2020-10-05 (×2): qty 1
  Filled 2020-10-05: qty 3
  Filled 2020-10-05: qty 1

## 2020-10-05 MED ORDER — SENNA 8.6 MG PO TABS
1.0000 | ORAL_TABLET | Freq: Two times a day (BID) | ORAL | Status: DC
  Administered 2020-10-06 – 2020-10-17 (×22): 8.6 mg via ORAL
  Filled 2020-10-05 (×22): qty 1

## 2020-10-05 MED ORDER — DEXAMETHASONE SODIUM PHOSPHATE 4 MG/ML IJ SOLN: 4.0000 mg | Freq: Three times a day (TID) | INTRAMUSCULAR | Status: DC

## 2020-10-05 MED ORDER — PROMETHAZINE HCL 25 MG/ML IJ SOLN: 6.2500 mg | INTRAMUSCULAR | Status: DC | PRN

## 2020-10-05 MED ORDER — ACETAMINOPHEN 325 MG PO TABS: 650.0000 mg | ORAL_TABLET | ORAL | Status: DC | PRN

## 2020-10-05 MED ORDER — MYCOPHENOLATE SODIUM 180 MG PO TBEC
360.0000 mg | DELAYED_RELEASE_TABLET | Freq: Two times a day (BID) | ORAL | Status: DC
  Administered 2020-10-06 – 2020-10-17 (×23): 360 mg via ORAL
  Filled 2020-10-05 (×24): qty 2

## 2020-10-05 MED ORDER — ROCURONIUM BROMIDE 10 MG/ML (PF) SYRINGE
PREFILLED_SYRINGE | INTRAVENOUS | Status: AC
  Filled 2020-10-05: qty 10

## 2020-10-05 MED ORDER — SODIUM CHLORIDE 0.9 % IV SOLN: INTRAVENOUS | Status: DC | PRN

## 2020-10-05 MED ORDER — THROMBIN 20000 UNITS EX SOLR
CUTANEOUS | Status: AC
  Filled 2020-10-05: qty 20000

## 2020-10-05 MED ORDER — DEXAMETHASONE SODIUM PHOSPHATE 10 MG/ML IJ SOLN
INTRAMUSCULAR | Status: AC
  Filled 2020-10-05: qty 1

## 2020-10-05 MED ORDER — BACITRACIN ZINC 500 UNIT/GM EX OINT
TOPICAL_OINTMENT | CUTANEOUS | Status: DC | PRN
  Administered 2020-10-05: 1 via TOPICAL

## 2020-10-05 MED ORDER — DEXAMETHASONE SODIUM PHOSPHATE 10 MG/ML IJ SOLN
6.0000 mg | Freq: Four times a day (QID) | INTRAMUSCULAR | Status: AC
  Administered 2020-10-05 – 2020-10-06 (×4): 6 mg via INTRAVENOUS
  Filled 2020-10-05 (×4): qty 1

## 2020-10-05 MED ORDER — LACTATED RINGERS IV SOLN: INTRAVENOUS | Status: DC

## 2020-10-05 MED ORDER — ONDANSETRON HCL 4 MG PO TABS: 4.0000 mg | ORAL_TABLET | ORAL | Status: DC | PRN

## 2020-10-05 MED ORDER — LEVETIRACETAM IN NACL 500 MG/100ML IV SOLN
500.0000 mg | Freq: Two times a day (BID) | INTRAVENOUS | Status: DC
  Administered 2020-10-05 – 2020-10-06 (×3): 500 mg via INTRAVENOUS
  Filled 2020-10-05 (×4): qty 100

## 2020-10-05 MED ORDER — PANTOPRAZOLE SODIUM 40 MG IV SOLR
40.0000 mg | Freq: Every day | INTRAVENOUS | Status: DC
  Administered 2020-10-05 – 2020-10-09 (×5): 40 mg via INTRAVENOUS
  Filled 2020-10-05 (×5): qty 40

## 2020-10-05 MED ORDER — TACROLIMUS 1 MG PO CAPS
4.0000 mg | ORAL_CAPSULE | Freq: Two times a day (BID) | ORAL | Status: DC
  Administered 2020-10-06 – 2020-10-07 (×4): 4 mg via ORAL
  Filled 2020-10-05 (×6): qty 4

## 2020-10-05 MED ORDER — FENTANYL CITRATE (PF) 250 MCG/5ML IJ SOLN
INTRAMUSCULAR | Status: DC | PRN
  Administered 2020-10-05: 50 ug via INTRAVENOUS
  Administered 2020-10-05 (×2): 100 ug via INTRAVENOUS

## 2020-10-05 MED ORDER — THROMBIN 5000 UNITS EX SOLR
CUTANEOUS | Status: AC
  Filled 2020-10-05: qty 5000

## 2020-10-05 MED ORDER — LIDOCAINE-EPINEPHRINE 2 %-1:100000 IJ SOLN
INTRAMUSCULAR | Status: AC
  Filled 2020-10-05: qty 1

## 2020-10-05 MED ORDER — CLEVIDIPINE BUTYRATE 0.5 MG/ML IV EMUL
INTRAVENOUS | Status: DC | PRN
  Administered 2020-10-05: 5 mg/h via INTRAVENOUS

## 2020-10-05 SURGICAL SUPPLY — 57 items
BAG COUNTER SPONGE SURGICOUNT (BAG) ×2 IMPLANT
BAG SPNG CNTER NS LX DISP (BAG) ×1
BAND INSRT 18 STRL LF DISP RB (MISCELLANEOUS)
BAND RUBBER #18 3X1/16 STRL (MISCELLANEOUS) IMPLANT
BUR SPIRAL ROUTER 2.3 (BUR) ×2 IMPLANT
CANISTER SUCT 3000ML PPV (MISCELLANEOUS) ×2 IMPLANT
CATH VENTRIC 35X38 W/TROCAR LG (CATHETERS) ×2 IMPLANT
CLIP VESOCCLUDE MED 6/CT (CLIP) IMPLANT
DRAPE MICROSCOPE LEICA (MISCELLANEOUS) IMPLANT
DRAPE NEUROLOGICAL W/INCISE (DRAPES) ×2 IMPLANT
DRAPE SURG 17X23 STRL (DRAPES) IMPLANT
DRAPE WARM FLUID 44X44 (DRAPES) ×2 IMPLANT
DURAPREP 6ML APPLICATOR 50/CS (WOUND CARE) ×2 IMPLANT
ELECT REM PT RETURN 9FT ADLT (ELECTROSURGICAL) ×2
ELECTRODE REM PT RTRN 9FT ADLT (ELECTROSURGICAL) ×1 IMPLANT
EVACUATOR 1/8 PVC DRAIN (DRAIN) IMPLANT
GAUZE 4X4 16PLY ~~LOC~~+RFID DBL (SPONGE) ×4 IMPLANT
GAUZE SPONGE 4X4 12PLY STRL (GAUZE/BANDAGES/DRESSINGS) ×2 IMPLANT
GAUZE SPONGE 4X4 12PLY STRL LF (GAUZE/BANDAGES/DRESSINGS) ×2 IMPLANT
GLOVE SURG ENC MOIS LTX SZ7 (GLOVE) IMPLANT
GLOVE SURG ENC MOIS LTX SZ8 (GLOVE) ×2 IMPLANT
GLOVE SURG UNDER POLY LF SZ7 (GLOVE) IMPLANT
GOWN STRL REUS W/ TWL LRG LVL3 (GOWN DISPOSABLE) ×2 IMPLANT
GOWN STRL REUS W/ TWL XL LVL3 (GOWN DISPOSABLE) ×2 IMPLANT
GOWN STRL REUS W/TWL 2XL LVL3 (GOWN DISPOSABLE) IMPLANT
GOWN STRL REUS W/TWL LRG LVL3 (GOWN DISPOSABLE) ×4
GOWN STRL REUS W/TWL XL LVL3 (GOWN DISPOSABLE) ×4
HEMOSTAT POWDER KIT SURGIFOAM (HEMOSTASIS) IMPLANT
KIT BASIN OR (CUSTOM PROCEDURE TRAY) ×2 IMPLANT
KIT DRAIN CSF ACCUDRAIN (MISCELLANEOUS) ×2 IMPLANT
KIT TURNOVER KIT B (KITS) ×2 IMPLANT
NEEDLE HYPO 22GX1.5 SAFETY (NEEDLE) ×2 IMPLANT
NS IRRIG 1000ML POUR BTL (IV SOLUTION) ×2 IMPLANT
PACK CRANIOTOMY CUSTOM (CUSTOM PROCEDURE TRAY) ×2 IMPLANT
PAD ARMBOARD 7.5X6 YLW CONV (MISCELLANEOUS) ×2 IMPLANT
PATTIES SURGICAL .25X.25 (GAUZE/BANDAGES/DRESSINGS) IMPLANT
PATTIES SURGICAL .5 X.5 (GAUZE/BANDAGES/DRESSINGS) IMPLANT
PATTIES SURGICAL .5 X3 (DISPOSABLE) IMPLANT
PATTIES SURGICAL 1X1 (DISPOSABLE) IMPLANT
PERFORATOR LRG  14-11MM (BIT) ×2
PERFORATOR LRG 14-11MM (BIT) ×1 IMPLANT
PIN MAYFIELD SKULL DISP (PIN) IMPLANT
PLATE CRANIAL 12 2H RIGID UNI (Plate) ×8 IMPLANT
SCREW UNIII AXS SD 1.5X4 (Screw) ×16 IMPLANT
SPONGE NEURO XRAY DETECT 1X3 (DISPOSABLE) IMPLANT
SPONGE SURGIFOAM ABS GEL 100 (HEMOSTASIS) ×4 IMPLANT
STAPLER VISISTAT 35W (STAPLE) ×2 IMPLANT
SUT ETHILON 3 0 FSL (SUTURE) IMPLANT
SUT NURALON 4 0 TR CR/8 (SUTURE) ×4 IMPLANT
SUT VIC AB 2-0 CP2 18 (SUTURE) ×4 IMPLANT
SYR CONTROL 10ML LL (SYRINGE) ×2 IMPLANT
TAPE CLOTH SURG 4X10 WHT LF (GAUZE/BANDAGES/DRESSINGS) ×2 IMPLANT
TOWEL GREEN STERILE (TOWEL DISPOSABLE) ×2 IMPLANT
TOWEL GREEN STERILE FF (TOWEL DISPOSABLE) ×2 IMPLANT
TRAY FOLEY MTR SLVR 16FR STAT (SET/KITS/TRAYS/PACK) IMPLANT
UNDERPAD 30X36 HEAVY ABSORB (UNDERPADS AND DIAPERS) IMPLANT
WATER STERILE IRR 1000ML POUR (IV SOLUTION) ×2 IMPLANT

## 2020-10-05 NOTE — Op Note (Signed)
10/05/2020  8:52 PM  PATIENT:  Christopher Chapman  65 y.o. male  PRE-OPERATIVE DIAGNOSIS: Large right chronic subdural hematoma with midline shift and left hemiparesis  POST-OPERATIVE DIAGNOSIS:  same  PROCEDURE: Right craniotomy for evacuation of subdural hematoma  SURGEON:  Sherley Bounds, MD  ASSISTANTS: Glenford Peers FNP  ANESTHESIA:   General  EBL: 100 ml  Total I/O In: 1815 [I.V.:1500; Blood:315] Out: 300 [Urine:200; Blood:100]  BLOOD ADMINISTERED: none  DRAINS: Subdural drain  SPECIMEN:  none  INDICATION FOR PROCEDURE: This patient presented with left hemiparesis and mental status changes and he was found down for an unknown amount of time. Imaging showed large right-sided chronic subdural hematoma.  Recommended right craniotomy for evacuation of the subdural hematoma. Patient understood the risks, benefits, and alternatives and potential outcomes and wished to proceed.  PROCEDURE DETAILS: The patient was taken to the operating room and after induction of adequate generalized endotracheal anesthesia, the head was affixed in a 3 point [place] head rest, and turned to the left to expose the right frontotemporal parietal region. The head was shaved and then cleaned with alcohol and Hibiclens scrub and then prepped with DuraPrep and draped in the usual sterile fashion. 10 cc of local anesthetic was injected, and a curvilinear incision was made on the right of the head just above the superior temporal line. Raney clips were placed to establish hemostasis of the scalp, the muscle was reflected with the scalp flap, to expose the right frontotemporal parietal region. A burr hole was placed, and a craniotomy flap was turned utilizing the high-speed, air powered drill. The flap was then placed in bacitracin-containing saline solution, and the dura was opened to expose a large right chronic subdural hematoma. A hematoma was then removed with a combination of irrigation and suction. I  continued to irrigate until the irrigant was clear to, and dried any bleeding with bipolar cautery.  We fenestrated a thin membrane over the surface of the brain.  We continued to irrigate to the irrigant was clear.  I then placed a subdural drain through separate stab incision and close the dura with a running 4-0 Nurolon suture. Dural tack up sutures were placed. The dura was lined with Gelfoam, and the craniotomy flap was replaced with doggie-bone plates. The wound was copiously irrigated. the galea was then closed with interrupted 2-0 Vicryl suture. The skin was then closed with staples a sterile dressing was applied. The patient was then taken out of the 3-point [place] headrest and awakened from general anesthesia, and transported to the recovery room in stable condition. At the end of the procedure all sponge, needle, and instrument counts were correct.    PLAN OF CARE: Admit to inpatient   PATIENT DISPOSITION:  PACU - hemodynamically stable.   Delay start of Pharmacological VTE agent (>24hrs) due to surgical blood loss or risk of bleeding:  yes

## 2020-10-05 NOTE — Anesthesia Preprocedure Evaluation (Addendum)
Anesthesia Evaluation  Patient identified by MRN, date of birth, ID band Patient awake and Patient confused    Reviewed: Unable to perform ROS - Chart review onlyPreop documentation limited or incomplete due to emergent nature of procedure.  History of Anesthesia Complications Negative for: history of anesthetic complications  Airway Mallampati: III  TM Distance: >3 FB Neck ROM: Full    Dental  (+) Poor Dentition, Dental Advisory Given   Pulmonary Current Smoker,    breath sounds clear to auscultation       Cardiovascular hypertension, Pt. on medications (-) angina Rhythm:Regular Rate:Tachycardia     Neuro/Psych Presented to ED with altered mental status.  According to report, patient was down for possibly 4 days at home and since then has been having weakness in his left arm and left leg as well as confusion: LARGE SUBDURAL    GI/Hepatic (+)     substance abuse  alcohol use and cocaine use,   Endo/Other    Renal/GU ESRF and DialysisRenal diseaseUnknown when last dialysis was, K+ 5.4     Musculoskeletal   Abdominal   Peds  Hematology  (+) Blood dyscrasia (Hb 9.6), anemia ,   Anesthesia Other Findings   Reproductive/Obstetrics                            Anesthesia Physical Anesthesia Plan  ASA: 4 and emergent  Anesthesia Plan: General   Post-op Pain Management:    Induction: Intravenous  PONV Risk Score and Plan: 1 and Ondansetron and Treatment may vary due to age or medical condition  Airway Management Planned: Oral ETT and Video Laryngoscope Planned  Additional Equipment: Arterial line  Intra-op Plan:   Post-operative Plan: Possible Post-op intubation/ventilation  Informed Consent:     Only emergency history available and History available from chart only  Plan Discussed with: CRNA and Surgeon  Anesthesia Plan Comments: (Emergent, no family available, pt confused )        Anesthesia Quick Evaluation

## 2020-10-05 NOTE — Anesthesia Procedure Notes (Signed)
Procedure Name: Intubation Date/Time: 10/05/2020 8:03 PM Performed by: Clovis Cao, CRNA Pre-anesthesia Checklist: Patient identified, Emergency Drugs available, Suction available and Patient being monitored Patient Re-evaluated:Patient Re-evaluated prior to induction Oxygen Delivery Method: Circle system utilized Preoxygenation: Pre-oxygenation with 100% oxygen Induction Type: IV induction and Rapid sequence Laryngoscope Size: Glidescope and 4 Grade View: Grade I Tube type: Oral Tube size: 7.5 mm Number of attempts: 1 Airway Equipment and Method: Stylet and Video-laryngoscopy Placement Confirmation: ETT inserted through vocal cords under direct vision, positive ETCO2 and breath sounds checked- equal and bilateral Secured at: 23 cm Tube secured with: Tape Dental Injury: Teeth and Oropharynx as per pre-operative assessment

## 2020-10-05 NOTE — ED Notes (Signed)
Tried to call CT to get pt to scan- no answer

## 2020-10-05 NOTE — Progress Notes (Signed)
Surgery declared emergency. Unable to obtain consent- patient's mother is also confused and unable to give consent.

## 2020-10-05 NOTE — ED Provider Notes (Signed)
Patient with large subdural hematoma.  Patient will be taken to the operating room by neurosurgery.   Milton Ferguson, MD 10/05/20 [phone]

## 2020-10-05 NOTE — ED Notes (Signed)
Pt changed into hospital gown to go to OR. Pt has urine and feces on jeans. Mother notified and jeans and 1 lion ring was sent with mother. PT then wiped off and taken to short stay OR room

## 2020-10-05 NOTE — H&P (Addendum)
Reason for Consult:sdh Referring Physician: zammit  Christopher Chapman is an 65 y.o. male.   HPI:  65 year old male presented to the ED tonight after being found down for an unknown amount of time. Patient is a very poor historian as he is very confused. His mother is at the bedside and is also a poor historian. Denies any HA, NV, dizziness, or vision changes. States that he has not had dialysis in a long time. Denies taking any blood thinners.   Past Medical History:  Diagnosis Date   Anemia    Arthritis    CRF (chronic renal failure)    ESRD (end stage renal disease) ([place])    Family history of anesthesia complication    [place] going to sleep.   Gout    Hx of cocaine abuse ([place])    Hyperlipidemia    Hypertension    Hyperuricemia    MRSA (methicillin resistant staph aureus) culture positive 07/2011   Parathyroid disease ([place])    Hx of , No meds at present   Tobacco abuse    Tuberculosis 1985   Treated with imeds for a years.    Past Surgical History:  Procedure Laterality Date   ANGIOPLASTY  08/09/2011   Procedure: ANGIOPLASTY;  Surgeon: Angelia Mould, MD;  Location: [facility];  Service: Vascular;  Laterality: Left;  Left Braciocephalic Fistula using Vascu-Guard Patch   APPENDECTOMY  1980   AV FISTULA PLACEMENT Left 2010   INCISION AND DRAINAGE  12/2012   sebacebus cyst, infected   MANDIBLE FRACTURE SURGERY     [name] N/A 07/16/2011   Procedure: Earney Mallet;  Surgeon: Angelia Mould, MD;  [facility];  Service: Cardiovascular;  Laterality: N/A;    No Known Allergies  Social History   Tobacco Use   Smoking status: Every Day    Packs/day: 0.10    Years: 30.00    Pack years: 3.00    Types: Cigarettes    Start date: 02/15/2016   Smokeless tobacco: Never   Tobacco comments:    Quit in Feb.  Substance Use Topics   Alcohol use: Yes    Comment: occasional    Family History  Problem Relation Age of Onset   Diabetes Father    Heart disease  Father    Hypertension Father    Other Father        amputation   Healthy Mother    Diabetes Sister    Heart disease Sister    Colon cancer Neg Hx    Stomach cancer Neg Hx    Rectal cancer Neg Hx    Esophageal cancer Neg Hx    Liver cancer Neg Hx    Colon polyps Neg Hx      Review of Systems  Positive ROS: as asbove  All other systems have been reviewed and were otherwise negative with the exception of those mentioned in the HPI and as above.  Objective: Vital signs in last 24 hours: Temp:  [98.6 F (37 C)] 98.6 F (37 C) (09/28 1449) Pulse Rate:  [89-99] 99 (09/28 1718) Resp:  [17-18] 18 (09/28 1800) BP: (159-197)/(90-99) 181/97 (09/28 1800) SpO2:  [100 %] 100 % (09/28 1718) Weight:  [90.7 kg] 90.7 kg (09/28 1440)  General Appearance: lethargic, cooperative, no distress, appears stated age Head: Normocephalic, without obvious abnormality, atraumatic Eyes: PERRL, conjunctiva/corneas clear, EOM's intact, fundi benign, both eyes      Lungs: respirations unlabored Heart: Regular rate and rhythm Extremities: Extremities  normal, atraumatic, no cyanosis or edema Pulses: 2+ and symmetric all extremities Skin: Skin color, texture, turgor normal, no rashes or lesions  NEUROLOGIC:   Mental status:lethargic and confused, poor attention span, poor Memory and fund of knowledge Motor Exam - LUE 3/5, LLE 1/5 Sensory Exam - grossly normal Reflexes: symmetric, no pathologic reflexes, No Hoffman's, No clonus Coordination - grossly normal Gait - unable to test Balance - unable to test Cranial Nerves: I: smell Not tested  II: visual acuity  OS: na    OD: na  II: visual fields Full to confrontation  II: pupils Equal, round, reactive to light  III,VII: ptosis None  III,IV,VI: extraocular muscles  Full ROM  V: mastication Normal  V: facial light touch sensation  Normal  V,VII: corneal reflex  Present  VII: facial muscle function - upper  Normal  VII: facial muscle function -  lower Normal  VIII: hearing Not tested  IX: soft palate elevation  Normal  IX,X: gag reflex Present  XI: trapezius strength  5/5  XI: sternocleidomastoid strength 5/5  XI: neck flexion strength  5/5  XII: tongue strength  Normal    Data Review Lab Results  Component Value Date   WBC 10.4 10/05/2020   HGB 9.6 (L) 10/05/2020   HCT 33.1 (L) 10/05/2020   MCV 92.2 10/05/2020   PLT 233 10/05/2020   Lab Results  Component Value Date   NA 140 10/05/2020   K 5.4 (H) 10/05/2020   CL 111 10/05/2020   CO2 13 (L) 10/05/2020   BUN 102 (H) 10/05/2020   CREATININE 17.95 (H) 10/05/2020   GLUCOSE 93 10/05/2020   No results found for: INR, PROTIME  Radiology: CT HEAD WO CONTRAST (5MM)  Result Date: 10/05/2020 CLINICAL DATA:  [place], found down, left arm and left leg weakness EXAM: CT HEAD WITHOUT CONTRAST TECHNIQUE: Contiguous axial images were obtained from the base of the skull through the vertex without intravenous contrast. COMPARISON:  07/06/2003 FINDINGS: Brain: There is a subacute right-sided subdural hematoma measuring up to 2.6 cm in maximal thickness. The majority of the subdural collection is decreased in attenuation, with scattered dependent higher attenuation more acute blood products identified posteriorly. There is significant mass effect and leftward midline shift, measuring approximately 1.7 cm at the level of the septum pellucidum. No evidence of acute infarct. Effacement of the lateral ventricles due to mass effect. Remaining midline structures are unremarkable. Vascular: No hyperdense vessel or unexpected calcification. Skull: Normal. Negative for fracture or focal lesion. Sinuses/Orbits: No acute finding. Invagination of the left lamina papyracea consistent with chronic injury. Other: None. IMPRESSION: 1. Subacute right-sided subdural hematoma with significant mass effect and leftward midline shift measuring 1.7 cm. 2. No acute infarct. Critical Value/emergent results were called by  telephone at the time of interpretation on 10/05/2020 at 5:55 pm to provider JOSEPH ZAMMIT, who verbally acknowledged these results. Electronically Signed   By: Randa Ngo M.D.   On: 10/05/2020 17:59   CT Cervical Spine Wo Contrast  Result Date: 10/05/2020 CLINICAL DATA:  Found down, left-sided weakness EXAM: CT CERVICAL SPINE WITHOUT CONTRAST TECHNIQUE: Multidetector CT imaging of the cervical spine was performed without intravenous contrast. Multiplanar CT image reconstructions were also generated. COMPARISON:  None. FINDINGS: Alignment: Alignment is anatomic. Skull base and vertebrae: No acute fracture. No primary bone lesion or focal pathologic process. Soft tissues and spinal canal: No prevertebral fluid or swelling. No visible canal hematoma. Prominent atherosclerosis at the carotid bifurcations. Disc levels:  Mild spondylosis at  C5-6 and C6-7. Upper chest: Central airway is patent.  Lung apices are clear. Other: Reconstructed images demonstrate no additional findings. IMPRESSION: 1. No acute cervical spine fracture. Electronically Signed   By: Randa Ngo M.D.   On: 10/05/2020 17:58   DG Chest Portable 1 View  Result Date: 10/05/2020 CLINICAL DATA:  New left arm and leg weakness, coarse breath sounds EXAM: PORTABLE CHEST 1 VIEW COMPARISON:  Chest radiograph 06/15/2015 FINDINGS: The heart is borderline enlarged. The mediastinal contours are within normal limits. The lungs are well inflated. There is no focal consolidation or pulmonary edema. There is no pleural effusion or pneumothorax. There is no acute osseous abnormality. IMPRESSION: Borderline cardiomegaly. Otherwise, no radiographic evidence of acute cardiopulmonary process. Electronically Signed   By: Valetta Mole M.D.   On: 10/05/2020 16:43     Assessment/Plan: 65 year old patient comes in today after being found down.Ct head showed a very large right sided chronic subdural hematoma with a 1.7cm midline shift and mass effect. I do  think that the patient needs to have an emergency right sided craniotomy for evacuation of subdural hematoma. We discussed this plan with the patient and his mother at the bedside. They are both agreeable. We discussed all the risks associated with the surgery.     Ocie Cornfield [facility] 10/05/2020 6:59 PM

## 2020-10-05 NOTE — Hospital Course (Addendum)
Subacute R SDH now S/P R Craniotomy and Evacuation:  Patient presented to the ED on 9/28 after being found down for an unknown amount of time.  CT of the head was performed and showed a very large right sided chronic subdural hematoma with a 1.7cm midline shift and mass effect.  At that time, patient underwent emergency right-sided craniotomy for evacuation of subdural hematoma, performed on the same day. He was continued on postop Keppra and Decadron per neurosurgery. Postoperatively, PCCM was consulted to assist with medical management until 10/1 when he was admitted to our service.  Patient was mentating well at bedside for the majority of his the rest of his hospitalization, with the exception of some pressured speech and tangential language.  Therefore, Decadron was discontinued on 10/4, with improvement of his mental status as a result.  On 10/5, repeat head CT without contrast was obtained and showed reaccumulation of hyperdense fluid collection consistent with recurrent chronic subdural hematoma.  For, he underwent repeat craniotomy and evacuation on 10/7.  His Keppra was continued until ***. 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 Creatinine was already in high 5's range in 03/17/20 and he was counseled that he was nearing ESRD again but missed his f/u appt 2 mths later. He is almost certainly ESRD again now; h/o noncompliance and currently with poor social support thus may not be a candidate for another transplant. While inpatient, he has been followed by nephrology and has been receiving iHD.  His mycophenolate and tacrolimus regimens were continued per nephrology.    HTN:   Throughout his hospitalization, this was managed with amlodipine 5 mg daily, labetalol 300 mg 3 times daily, and as needed hydralazine.    Leukocytosis ,Suspect Reactive Staph epi on  1/2 plates, likely contaminate, leukocytosis continues to downtrend. - Trend CBC   DM:  Sliding scale insulin was continued throughout his hospitalization.   Acute Blood Loss Anemia Platelet Dysfunction in the Setting of Uremia:  Patient received 1 PRBC on 9/28.  Otherwise, his CBCs were followed and hemoglobin was stable throughout his hospitalization.

## 2020-10-05 NOTE — Anesthesia Procedure Notes (Addendum)
Arterial Line Insertion Start/End9/28/2022 7:10 PM, 10/05/2020 7:30 PM Performed by: Clovis Cao, CRNA, CRNA  Patient location: OOR procedure area. Preanesthetic checklist: patient identified, IV checked, surgical consent, monitors and equipment checked, pre-op evaluation, timeout performed and anesthesia consent Emergency situation Lidocaine 1% used for infiltration Right, radial was placed Catheter size: 20 G Hand hygiene performed  and maximum sterile barriers used   Attempts: 1 Procedure performed without using ultrasound guided technique. Ultrasound Notes:anatomy identified, needle tip was noted to be adjacent to the nerve/plexus identified and no ultrasound evidence of intravascular and/or intraneural injection Following insertion, dressing applied and Biopatch. Post procedure assessment: normal and unchanged

## 2020-10-05 NOTE — ED Provider Notes (Signed)
[facility] EMERGENCY DEPARTMENT Provider Note   CSN: [id number] Arrival date & time: 10/05/20  1428     History Chief Complaint  Patient presents with   Altered Mental Status    Christopher Chapman is a 65 y.o. male.  The history is provided by the patient. The history is limited by the condition of the patient. No language interpreter was used.  Altered Mental Status Presenting symptoms: confusion   Severity:  Moderate Episode history:  Unable to specify Timing:  Constant Progression:  Unchanged Associated symptoms: no abdominal pain, no agitation, no depression, no fever, no headaches, no light-headedness, no nausea, no palpitations, no rash, no slurred speech, no vomiting and no weakness       Past Medical History:  Diagnosis Date   Anemia    Arthritis    CRF (chronic renal failure)    ESRD (end stage renal disease) (Christopher Chapman)    Family history of anesthesia complication    [place] going to sleep.   Gout    Hx of cocaine abuse (Christopher [facility])    Hyperlipidemia    Hypertension    Hyperuricemia    MRSA (methicillin resistant staph aureus) culture positive 07/2011   Parathyroid disease (Christopher Chapman)    Hx of , No meds at present   Tobacco abuse    Tuberculosis 1985   Treated with imeds for a years.    Patient Active Problem List   Diagnosis Date Noted   Type II diabetes mellitus (Christopher Chapman) 03/15/2020   CMV (cytomegalovirus infection) (Christopher Chapman) 01/29/2017   Kidney replaced by transplant 10/04/2016   Immunosuppression (Christopher Chapman) 09/24/2016   History of gout 05/14/2016   Carotid stenosis, bilateral 05/14/2016   Routine adult health maintenance 05/14/2016   Dyslipidemia 01/14/2013   History of cocaine use 01/14/2013   End stage renal disease (Christopher Chapman) 06/27/2011   Essential hypertension 08/19/2006   HYPERPARATHYROIDISM, SECONDARY 08/19/2006   ALCOHOL ABUSE, HX OF 08/19/2006    Past Surgical History:  Procedure Laterality Date   ANGIOPLASTY  08/09/2011   Procedure:  ANGIOPLASTY;  Surgeon: Angelia Mould, MD;  Location: [facility];  Service: Vascular;  Laterality: Left;  Left Braciocephalic Fistula using Vascu-Guard Patch   [place] Left 2010   INCISION AND DRAINAGE  12/2012   sebacebus cyst, infected   MANDIBLE FRACTURE SURGERY     Christopher Chapman N/A 07/16/2011   Procedure: Earney Mallet;  Surgeon: Angelia Mould, MD;  Location: [facility] CATH LAB;  Service: Cardiovascular;  Laterality: N/A;       Family History  Problem Relation Age of Onset   Diabetes Father    Heart disease Father    Hypertension Father    Other Father        amputation   Healthy Mother    Diabetes Sister    Heart disease Sister    Colon cancer Neg Hx    Stomach cancer Neg Hx    Rectal cancer Neg Hx    Esophageal cancer Neg Hx    Liver cancer Neg Hx    Colon polyps Neg Hx     Social History   Tobacco Use   Smoking status: Every Day    Packs/day: 0.10    Years: 30.00    Pack years: 3.00    Types: Cigarettes    Start date: 02/15/2016   Smokeless tobacco: Never   Tobacco comments:    Quit in Feb.  Substance Use Topics   Alcohol use: Yes  Comment: occasional   Drug use: No    Frequency: 4.0 times per week    Home Medications Prior to Admission medications   Medication Sig Start Date End Date Taking? Authorizing Provider  atorvastatin (LIPITOR) 40 MG tablet Take 1 tablet (40 mg total) by mouth daily. 03/16/20 03/16/21  [name], MD  ACCU-CHEK AVIVA PLUS test strip USE 1 STRIP DAILY TO TEST BLOOD SUGAR 11/15/16   [provider]  allopurinol (ZYLOPRIM) 100 MG tablet Take 200 mg by mouth daily.  05/18/11   [provider]  amLODipine (NORVASC) 5 MG tablet Take 1 tablet (5 mg total) by mouth daily. 03/15/20 03/15/21  [name], MD  ASPIRIN LOW DOSE 81 MG EC tablet TAKE 1 TABLET(81 MG) BY MOUTH DAILY. SWALLOW WHOLE 06/27/20   [name], MD  Blood Glucose Monitoring Suppl (ACCU-CHEK AVIVA PLUS)  w/Device KIT USE AS INSTRUCTED; PLEASE CHECK BLOOD SUGAR ONCE A DAY 1 2 HRS AFTER MEALS 11/15/16   [provider]  labetalol (NORMODYNE) 300 MG tablet Take 1 tablet (300 mg total) by mouth 3 (three) times daily. 03/15/20   [name], MD  Lancets (ACCU-CHEK MULTICLIX) lancets Inject into the skin. 12/27/16   [provider]  metroNIDAZOLE (FLAGYL) 500 MG tablet Take 1 tablet (500 mg total) by mouth 2 (two) times daily. 08/10/20   [name], MD  mupirocin ointment (BACTROBAN) 2 % Apply 1 application topically 3 (three) times daily. 10/03/17   Christopher Haber, MD  mycophenolate (MYFORTIC) 180 MG EC tablet Take 360 mg by mouth 2 (two) times daily. 07/03/17   [provider]  tacrolimus (PROGRAF) 1 MG capsule Take 4 mg by mouth 2 (two) times daily.  01/24/17   [provider]    Allergies    Patient has no known allergies.  Review of Systems   Review of Systems  Constitutional:  Positive for fatigue. Negative for chills and fever.  HENT:  Negative for congestion.   Eyes:  Negative for visual disturbance.  Respiratory:  Negative for cough, chest tightness, shortness of breath and wheezing.   Cardiovascular:  Negative for chest pain and palpitations.  Gastrointestinal:  Negative for abdominal pain, constipation, diarrhea, nausea and vomiting.  Genitourinary:  Negative for dysuria and flank pain.  Musculoskeletal:  Negative for back pain and neck pain.  Skin:  Negative for rash and wound.  Neurological:  Negative for dizziness, weakness, light-headedness, numbness and headaches.  Psychiatric/Behavioral:  Positive for confusion. Negative for agitation.   All other systems reviewed and are negative.  Physical Exam Updated Vital Signs BP (!) 170/91 (BP Location: Right Arm)   Pulse 93   Temp 98.6 F (37 C) (Oral)   Resp 18   Ht $R'6\' 1"'UM$  (1.854 m)   Wt 90.7 kg   SpO2 100%   BMI 26.39 kg/m   Physical Exam Vitals and nursing note reviewed.   Constitutional:      General: He is not in acute distress.    Appearance: He is well-developed. He is not ill-appearing, toxic-appearing or diaphoretic.  HENT:     Head: Normocephalic and atraumatic.     Nose: Nose normal.     Mouth/Throat:     Mouth: Mucous membranes are dry.     Pharynx: No oropharyngeal exudate or posterior oropharyngeal erythema.  Eyes:     Extraocular Movements: Extraocular movements intact.     Conjunctiva/sclera: Conjunctivae normal.     Pupils: Pupils are equal, round, and reactive to light.  Cardiovascular:  Rate and Rhythm: Normal rate and regular rhythm.     Heart sounds: No murmur heard. Pulmonary:     Effort: Pulmonary effort is normal. No respiratory distress.     Breath sounds: Rhonchi present. No wheezing or rales.  Chest:     Chest wall: No tenderness.  Abdominal:     General: Abdomen is flat.     Palpations: Abdomen is soft.     Tenderness: There is no abdominal tenderness. There is no guarding or rebound.  Musculoskeletal:        General: No tenderness.     Cervical back: Neck supple. No tenderness.  Skin:    General: Skin is warm and dry.     Capillary Refill: Capillary refill takes less than 2 seconds.     Findings: No erythema or rash.  Neurological:     Mental Status: He is alert.     Sensory: No sensory deficit.     Motor: Weakness present.  Psychiatric:        Mood and Affect: Mood normal.    ED Results / Procedures / Treatments   Labs (all labs ordered are listed, but only abnormal results are displayed) Labs Reviewed  URINE CULTURE  CULTURE, BLOOD (ROUTINE X 2)  CULTURE, BLOOD (ROUTINE X 2)  CBC WITH DIFFERENTIAL/PLATELET  COMPREHENSIVE METABOLIC PANEL  LACTIC ACID, PLASMA  LACTIC ACID, PLASMA  LIPASE, BLOOD  PROTIME-INR  URINALYSIS, ROUTINE W REFLEX MICROSCOPIC  CK  RAPID URINE DRUG SCREEN, HOSP PERFORMED  TSH  AMMONIA  TROPONIN I (HIGH SENSITIVITY)    EKG EKG Interpretation  Date/Time:  Wednesday  October 05 2020 14:41:39 EDT Ventricular Rate:  86 PR Interval:  150 QRS Duration: 94 QT Interval:  404 QTC Calculation: 484 R Axis:   61 Text Interpretation: Sinus rhythm Repol abnrm suggests ischemia, lateral leads When compared to prior, new t wave inversions in leads V5 and V6. No STEMI Confirmed by Antony Blackbird [phone]) on 10/05/2020 3:34:53 PM  Radiology No results found.  Procedures Procedures   Medications Ordered in ED Medications - No data to display  ED Course  I have reviewed the triage vital signs and the nursing notes.  Pertinent labs & imaging results that were available during my care of the patient were reviewed by me and considered in my medical decision making (see chart for details).    MDM Rules/Calculators/A&P                           ARSEN MANGIONE is a 65 y.o. male with a complex past medical history including kidney transplant on immunosuppression, ESRD on occasional dialysis, hypertension, hyperparathyroidism, gout, dyslipidemia, polysubstance abuse, diabetes, and remote TB who presents with altered mental status.  According to report, patient was down for possibly 4 days at home and since then has been having weakness in his left arm and left leg as well as confusion.  Patient is in a cervical immobilization collar after the fall and being down the ground but he is not complaining of any pain initially.  He is denying any fevers, chills, congestion, or cough.  Is denying any abdominal pain.  He reports his right abdomen is usually slightly more distended than the left side.  He is unaware if he has had any history of weakness but denies any history of stroke or TIA.  He was reportedly found in stool and urine on the ground and people with him were unable to  provide further information to EMS.  On arrival, patient is confused.  He does not remember what happened.  On exam, patient does have weakness in his left arm and left leg compared to right side.  He  has intact sensation.  No neglect was appreciated.  Pupil symmetric and reactive normal extraocular movements.  Lungs had some coarseness but chest and abdomen were nontender.  Normal bowel sounds appreciated.  Right hemiabdomen is slightly more distended but he reports is unchanged from his baseline.  Intact pulses.  EKG does not show STEMI but does have some new T wave inversions.  Clinically I am concerned about the patient being down for multiple days to look for rhabdo or other electrolyte abnormalities.  As the patient said he has not had dialysis in "a long time", and says it could have been weak since his last treatment.  He does not know when he is post to take it.  He also has the new weakness in the left arm and left leg so we will get both a CT scan of the head and neck to look for fracture or bleeding but also get MRI in case it is a stroke.  Anticipate admission when work-up is all completed.   Care transferred to oncoming team while waiting for results to complete to determine disposition   Final Clinical Impression(s) / ED Diagnoses Final diagnoses:  Altered mental status, unspecified altered mental status type  Left-sided weakness     Clinical Impression: 1. Altered mental status, unspecified altered mental status type   2. Left-sided weakness     Disposition: Care transferred to oncoming team while waiting for results to complete to determine disposition  This note was prepared with assistance of Dragon voice recognition software. Occasional wrong-word or sound-a-like substitutions may have occurred due to the inherent limitations of voice recognition software.     Teairra Millar, Gwenyth Allegra, MD 10/05/20 [phone]

## 2020-10-05 NOTE — Anesthesia Postprocedure Evaluation (Signed)
Anesthesia Post Note  Patient: Christopher Chapman  Procedure(s) Performed: CRANIOTOMY HEMATOMA EVACUATION SUBDURAL (Right: Head)     Patient location during evaluation: PACU Anesthesia Type: General Level of consciousness: awake and alert, patient cooperative and oriented Pain management: pain level controlled Vital Signs Assessment: post-procedure vital signs reviewed and stable Respiratory status: spontaneous breathing, nonlabored ventilation, respiratory function stable and patient connected to face mask oxygen Cardiovascular status: stable (BP managed with Cleviprex) Postop Assessment: no apparent nausea or vomiting Anesthetic complications: no Comments: Pt is oriented, mentation much improved from pre-op exam   No notable events documented.  Last Vitals:  Vitals:   10/05/20 2120 10/05/20 2130  BP: (!) 158/83 (!) 158/86  Pulse:  89  Resp: (!) 22 15  Temp: (!) 36.1 C   SpO2:  100%    Last Pain:  Vitals:   10/05/20 2130  TempSrc:   PainSc: Asleep                 Blessing Zaucha,E. Priscilla Kirstein

## 2020-10-05 NOTE — Consult Note (Addendum)
NAME:  Christopher Chapman, MRN:  [id number], DOB:  Oct 12, 1955, LOS: 0 ADMISSION DATE:  10/05/2020, CONSULTATION DATE:  10/05/20 REFERRING MD:  Ronnald Ramp CHIEF COMPLAINT:  AMS   History of Present Illness:  Christopher Chapman is a 65 y.o. male who has a PMH as below including but not limited to CKD s/p kidney transplant with notes of worsening renal failure requiring intermittent dialysis (pt unsure of when his last session was but has missed it in "quite a while"), HTN, HLD, DM, polysubstance abuse, remote TB.  He was brought to [facility] ED 9/28 with AMS and after being found down on the floor with unknown downtime, possibly 4 days.  CT head was obtained and demonstrated subacute R SDH with ME and MLS of 1.7cm.  He was evaluated by neurosurgery and was taken to the OR 9/28 for right craniotomy for evacuation.  Post op, PCCM asked to assist with medical management. Post op Hgb noted at 7.1.  1u PRBC ordered and transfusing.  Post op also hypertensive with SBP's 190s.  He was subsequently started on Clevidipine.  Pertinent  Medical History:  has Essential hypertension; HYPERPARATHYROIDISM, SECONDARY; ALCOHOL ABUSE, HX OF; End stage renal disease ([place]); History of gout; Carotid stenosis, bilateral; Routine adult health maintenance; CMV (cytomegalovirus infection) ([place]); Dyslipidemia; Immunosuppression ([place]); Kidney replaced by transplant; History of cocaine use; Type II diabetes mellitus ([place]); and Subdural hematoma ([place]) on their problem list.  Significant Hospital Events: Including procedures, antibiotic start and stop dates in addition to other pertinent events   9/28 > admit, to OR for craniotomy and evacuation.  Interim History / Subjective:  Answers basic questions but remains somnolent.  Objective:  Blood pressure (!) 158/83, pulse 99, temperature (!) 97 F (36.1 C), resp. rate (!) 22, height $RemoveBe'6\' 1"'bxnSYXJpw$  (1.854 m), weight 90.7 kg, SpO2 100 %.        Intake/Output Summary (Last 24 hours) at 10/05/2020  2138 Last data filed at 10/05/2020 2059 Gross per 24 hour  Intake 1815 ml  Output 300 ml  Net 1515 ml   Filed Weights   10/05/20 1440  Weight: 90.7 kg    Examination: General: Adult male, resting in bed, in NAD. Neuro: Somnolent but moaning and answers basic questions appropriately. HEENT: R scalp drain in place.  Sclerae anicteric. EOMI. Cardiovascular: RRR, no M/R/G.  Lungs: Respirations even and unlabored.  CTA bilaterally, No W/R/R. Abdomen: BS x 4, soft, NT/ND.  Musculoskeletal: No gross deformities, no edema.  Skin: Intact, warm, no rashes.  Labs/imaging personally reviewed:  CT head 9/28 > subacute R SDH with ME and MLS of 1.7cm.  Resolved Hospital Problem list:    Assessment & Plan:   Subacute R SDH - s/p right craniotomy and evacuation. - Post op care per neurosurgery. - Continue empiric Keppra, Decadron. - F/u on CT head.  Hx CKD s/p renal transplant  - has had worsening of renal function lately with mention of requiring intermittent dialysis (no nephrology notes in our system, but PCP notes from March 2022 mention calling pt and instructing him to follow up with his nephrologist Dr. Moshe Cipro).  He reportedly has required intermittent HD but unsure of when his last session was or the frequency of which he is prescribed HD.  No emergent indications for dialysis at the moment. Mild hyperkalemia - 2/2 above. - Continue home Mycophenolate, Tacrolimus in AM (assuming he can take PO safely by then). - Day team to please consult nephrology for further recs.  Hx HTN, HLD. -  Continue Clevidipine, goal SBP < 180 per neurosurgery. - Continue home Amlodipine and Labetalol in AM (assuming he can take PO safely by then). - Hydralazine PRN. - Hold home ASA.  Hx DM. - SSI.  Acute blood loss anemia - s/p 1u PRBC. - F/u on H/H post transfusion. - Maintain Hgb > 7.  Hx polysubstance abuse. - Assess UDS. - Substance abuse counseling when able.   Best practice  (evaluated daily):  Diet/type: Regular consistency (see orders) DVT prophylaxis: SCD GI prophylaxis: N/A Lines: Arterial Line Foley:  N/A Code Status:  full code Last date of multidisciplinary goals of care discussion: N/A.  Labs   CBC: Recent Labs  Lab 10/05/20 1553 10/05/20 1943 10/05/20 2024  WBC 10.4  --   --   NEUTROABS 8.0*  --   --   HGB 9.6* 9.2* 7.1*  HCT 33.1* 27.0* 21.0*  MCV 92.2  --   --   PLT 233  --   --     Basic Metabolic Panel: Recent Labs  Lab 10/05/20 1553 10/05/20 1943 10/05/20 2024  NA 140 144 146*  K 5.4* 5.2* 4.8  CL 111  --   --   CO2 13*  --   --   GLUCOSE 93  --   --   BUN 102*  --   --   CREATININE 17.95*  --   --   CALCIUM 7.5*  --   --    GFR: Estimated Creatinine Clearance: 4.8 mL/min (A) (by C-G formula based on SCr of 17.95 mg/dL (H)). Recent Labs  Lab 10/05/20 1526 10/05/20 1553 10/05/20 1645  WBC  --  10.4  --   LATICACIDVEN 1.2  --  1.0    Liver Function Tests: Recent Labs  Lab 10/05/20 1553  AST 16  ALT 13  ALKPHOS 70  BILITOT 0.9  PROT 7.4  ALBUMIN 3.0*   Recent Labs  Lab 10/05/20 1553  LIPASE 49   Recent Labs  Lab 10/05/20 1645  AMMONIA 37*    ABG    Component Value Date/Time   PHART 7.243 (L) 10/05/2020 2024   PCO2ART 40.3 10/05/2020 2024   PO2ART 519 (H) 10/05/2020 2024   HCO3 17.7 (L) 10/05/2020 2024   TCO2 19 (L) 10/05/2020 2024   ACIDBASEDEF 9.0 (H) 10/05/2020 2024   O2SAT 100.0 10/05/2020 2024     Coagulation Profile: Recent Labs  Lab 10/05/20 2010  INR 1.1    Cardiac Enzymes: Recent Labs  Lab 10/05/20 1553  CKTOTAL 511*    HbA1C: No results found for: HGBA1C  CBG: Recent Labs  Lab 10/05/20 2128  GLUCAP 121*    Review of Systems:   Unable to obtain as pt is encephalopathic.  Past Medical History:  He,  has a past medical history of Anemia, Arthritis, CRF (chronic renal failure), ESRD (end stage renal disease) ([place]), Family history of anesthesia complication,  Gout, cocaine abuse ([place]), Hyperlipidemia, Hypertension, Hyperuricemia, MRSA (methicillin resistant staph aureus) culture positive (07/2011), Parathyroid disease ([place]), Tobacco abuse, and Tuberculosis (1985).   Surgical History:   Past Surgical History:  Procedure Laterality Date   ANGIOPLASTY  08/09/2011   Procedure: ANGIOPLASTY;  Surgeon: Angelia Mould, MD;  Location: [facility];  Service: Vascular;  Laterality: Left;  Left Braciocephalic Fistula using Vascu-Guard Patch   APPENDECTOMY  1980   AV FISTULA PLACEMENT Left 2010   INCISION AND DRAINAGE  12/2012   sebacebus cyst, infected   MANDIBLE FRACTURE SURGERY  [name] N/A 07/16/2011   Procedure: Earney Mallet;  Surgeon: Angelia Mould, MD;  Location: [facility] CATH LAB;  Service: Cardiovascular;  Laterality: N/A;     Social History:   reports that he has been smoking cigarettes. He started smoking about 4 years ago. He has a 3.00 pack-year smoking history. He has never used smokeless tobacco. He reports current alcohol use. He reports that he does not use drugs.   Family History:  His family history includes Diabetes in his father and sister; Healthy in his mother; Heart disease in his father and sister; Hypertension in his father; Other in his father. There is no history of Colon cancer, Stomach cancer, Rectal cancer, Esophageal cancer, Liver cancer, or Colon polyps.   Allergies No Known Allergies   Home Medications  Prior to Admission medications   Medication Sig Start Date End Date Taking? Authorizing Provider  atorvastatin (LIPITOR) 40 MG tablet Take 1 tablet (40 mg total) by mouth daily. 03/16/20 03/16/21  [name], MD  ACCU-CHEK AVIVA PLUS test strip USE 1 STRIP DAILY TO TEST BLOOD SUGAR 11/15/16   [provider]  allopurinol (ZYLOPRIM) 100 MG tablet Take 200 mg by mouth daily.  05/18/11   [provider]  amLODipine (NORVASC) 5 MG tablet Take 1 tablet (5 mg total) by mouth daily. 03/15/20 03/15/21   [name], MD  ASPIRIN LOW DOSE 81 MG EC tablet TAKE 1 TABLET(81 MG) BY MOUTH DAILY. SWALLOW WHOLE 06/27/20   [name], MD  Blood Glucose Monitoring Suppl (ACCU-CHEK AVIVA PLUS) w/Device KIT USE AS INSTRUCTED; PLEASE CHECK BLOOD SUGAR ONCE A DAY 1 2 HRS AFTER MEALS 11/15/16   [provider]  labetalol (NORMODYNE) 300 MG tablet Take 1 tablet (300 mg total) by mouth 3 (three) times daily. 03/15/20   [name], MD  Lancets (ACCU-CHEK MULTICLIX) lancets Inject into the skin. 12/27/16   [provider]  metroNIDAZOLE (FLAGYL) 500 MG tablet Take 1 tablet (500 mg total) by mouth 2 (two) times daily. 08/10/20   [name], MD  mupirocin ointment (BACTROBAN) 2 % Apply 1 application topically 3 (three) times daily. 10/03/17   [name], MD  mycophenolate (MYFORTIC) 180 MG EC tablet Take 360 mg by mouth 2 (two) times daily. 07/03/17   [provider]  tacrolimus (PROGRAF) 1 MG capsule Take 4 mg by mouth 2 (two) times daily.  01/24/17   [provider]     Critical care time: 35 min.   Montey Hora, [facility] For pager details, please see AMION or use Epic chat  After 1900, please call [facility] for cross coverage needs 10/05/2020, 9:38 PM

## 2020-10-05 NOTE — Transfer of Care (Signed)
Immediate Anesthesia Transfer of Care Note  Patient: Christopher Chapman  Procedure(s) Performed: CRANIOTOMY HEMATOMA EVACUATION SUBDURAL (Right: Head)  Patient Location: PACU  Anesthesia Type:General  Level of Consciousness: drowsy  Airway & Oxygen Therapy: Patient Spontanous Breathing and Patient connected to face mask oxygen  Post-op Assessment: Report given to RN and Post -op Vital signs reviewed and stable  Post vital signs: Reviewed and stable  Last Vitals:  Vitals Value Taken Time  BP 158/83 10/05/20 2120  Temp    Pulse 88 10/05/20 2125  Resp 16 10/05/20 2125  SpO2 100 % 10/05/20 2125  Vitals shown include unvalidated device data.  Last Pain:  Vitals:   10/05/20 1920  TempSrc:   PainSc: 0-No pain         Complications: No notable events documented.

## 2020-10-05 NOTE — ED Triage Notes (Signed)
Pt here via EMS from home d/t a fall and left on floor for unknown down time. Family poor historian. Possible it was 4 days dow. Right abdomen distention noted. Alert and oriented X2. Left sided weakness. Pt states he has not been to HD in quite some time. Snoring respiration.   200/100 98% RA 198 CBG HR 100

## 2020-10-06 ENCOUNTER — Encounter (HOSPITAL_COMMUNITY): Payer: Self-pay | Admitting: Neurological Surgery

## 2020-10-06 ENCOUNTER — Inpatient Hospital Stay (HOSPITAL_COMMUNITY): Payer: Medicaid Other

## 2020-10-06 DIAGNOSIS — R4182 Altered mental status, unspecified: Secondary | ICD-10-CM | POA: Diagnosis not present

## 2020-10-06 LAB — BLOOD CULTURE ID PANEL (REFLEXED) - BCID2

## 2020-10-06 LAB — URINALYSIS, ROUTINE W REFLEX MICROSCOPIC
Bilirubin Urine: NEGATIVE
Glucose, UA: 50 mg/dL — AB
Ketones, ur: 20 mg/dL — AB
Nitrite: NEGATIVE
Protein, ur: >=300 mg/dL — AB
Specific Gravity, Urine: 1.013 (ref 1.005–1.030)
WBC, UA: >50 WBC/hpf — ABNORMAL HIGH (ref 0–5)
pH: 5 (ref 5.0–8.0)

## 2020-10-06 LAB — BASIC METABOLIC PANEL
Anion gap: 18 — ABNORMAL HIGH (ref 5–15)
BUN: 100 mg/dL — ABNORMAL HIGH (ref 8–23)
CO2: 10 mmol/L — ABNORMAL LOW (ref 22–32)
Calcium: 7.5 mg/dL — ABNORMAL LOW (ref 8.9–10.3)
Chloride: 112 mmol/L — ABNORMAL HIGH (ref 98–111)
Creatinine, Ser: 16.71 mg/dL — ABNORMAL HIGH (ref 0.61–1.24)
GFR, Estimated: 3 mL/min — ABNORMAL LOW (ref >60)
Glucose, Bld: 127 mg/dL — ABNORMAL HIGH (ref 70–99)
Potassium: 5.2 mmol/L — ABNORMAL HIGH (ref 3.5–5.1)
Sodium: 140 mmol/L (ref 135–145)

## 2020-10-06 LAB — GLUCOSE, CAPILLARY
Glucose-Capillary: 77 mg/dL (ref 70–99)
Glucose-Capillary: 129 mg/dL — ABNORMAL HIGH (ref 70–99)
Glucose-Capillary: 113 mg/dL — ABNORMAL HIGH (ref 70–99)
Glucose-Capillary: 130 mg/dL — ABNORMAL HIGH (ref 70–99)

## 2020-10-06 LAB — URINE CULTURE: Culture: NO GROWTH

## 2020-10-06 LAB — HEMOGLOBIN A1C
Hgb A1c MFr Bld: 5.6 % (ref 4.8–5.6)
Hgb A1c MFr Bld: 5.6 % (ref 4.8–5.6)
Mean Plasma Glucose: 114.02 mg/dL
Mean Plasma Glucose: 114.02 mg/dL

## 2020-10-06 LAB — CBC
HCT: 33.2 % — ABNORMAL LOW (ref 39.0–52.0)
Hemoglobin: 10.3 g/dL — ABNORMAL LOW (ref 13.0–17.0)
MCH: 27.6 pg (ref 26.0–34.0)
MCHC: 31 g/dL (ref 30.0–36.0)
MCV: 89 fL (ref 80.0–100.0)
Platelets: 221 10*3/uL (ref 150–400)
RBC: 3.73 MIL/uL — ABNORMAL LOW (ref 4.22–5.81)
RDW: 16.7 % — ABNORMAL HIGH (ref 11.5–15.5)
WBC: 17.9 10*3/uL — ABNORMAL HIGH (ref 4.0–10.5)
nRBC: 0 % (ref 0.0–0.2)

## 2020-10-06 LAB — PHOSPHORUS: Phosphorus: 8.8 mg/dL — ABNORMAL HIGH (ref 2.5–4.6)

## 2020-10-06 LAB — HIV ANTIBODY (ROUTINE TESTING W REFLEX): HIV Screen 4th Generation wRfx: NONREACTIVE

## 2020-10-06 LAB — MAGNESIUM: Magnesium: 2.1 mg/dL (ref 1.7–2.4)

## 2020-10-06 LAB — MRSA NEXT GEN BY PCR, NASAL: MRSA by PCR Next Gen: NOT DETECTED

## 2020-10-06 MED ORDER — SODIUM CHLORIDE 0.9 % IV SOLN: 100.0000 mL | INTRAVENOUS | Status: DC | PRN

## 2020-10-06 MED ORDER — LIDOCAINE-PRILOCAINE 2.5-2.5 % EX CREA
1.0000 "application " | TOPICAL_CREAM | CUTANEOUS | Status: DC | PRN
  Filled 2020-10-06: qty 5

## 2020-10-06 MED ORDER — HEPARIN SODIUM (PORCINE) 1000 UNIT/ML DIALYSIS: 1000.0000 [IU] | INTRAMUSCULAR | Status: DC | PRN

## 2020-10-06 MED ORDER — LIDOCAINE HCL (PF) 1 % IJ SOLN: 5.0000 mL | INTRAMUSCULAR | Status: DC | PRN

## 2020-10-06 MED ORDER — PENTAFLUOROPROP-TETRAFLUOROETH EX AERO: 1.0000 "application " | INHALATION_SPRAY | CUTANEOUS | Status: DC | PRN

## 2020-10-06 MED ORDER — SODIUM ZIRCONIUM CYCLOSILICATE 10 G PO PACK
10.0000 g | PACK | Freq: Once | ORAL | Status: AC
  Administered 2020-10-06: 10 g via ORAL
  Filled 2020-10-06: qty 1

## 2020-10-06 MED ORDER — ALTEPLASE 2 MG IJ SOLR
2.0000 mg | Freq: Once | INTRAMUSCULAR | Status: DC | PRN
Start: 2020-10-06 — End: 2020-10-17
  Filled 2020-10-06: qty 2

## 2020-10-06 MED ORDER — WHITE PETROLATUM EX OINT
TOPICAL_OINTMENT | CUTANEOUS | Status: AC
  Filled 2020-10-06: qty 28.35

## 2020-10-06 MED ORDER — CHLORHEXIDINE GLUCONATE CLOTH 2 % EX PADS
6.0000 | MEDICATED_PAD | Freq: Every day | CUTANEOUS | Status: DC
  Administered 2020-10-06 – 2020-10-17 (×10): 6 via TOPICAL

## 2020-10-06 NOTE — Consult Note (Addendum)
NAME:  Christopher Chapman, MRN:  [id number], DOB:  12/29/1955, LOS: 1 ADMISSION DATE:  10/05/2020, CONSULTATION DATE:  10/05/20 REFERRING MD:  Ronnald Ramp CHIEF COMPLAINT:  AMS   History of Present Illness:  Christopher Chapman is a 65 y.o. male who has a PMH as below including but not limited to CKD s/p kidney transplant with notes of worsening renal failure requiring intermittent dialysis (pt unsure of when his last session was but has missed it in "quite a while"), HTN, HLD, DM, polysubstance abuse, remote TB.  He was brought to [facility] ED 9/28 with AMS and after being found down on the floor with unknown downtime, possibly 4 days.  CT head was obtained and demonstrated subacute R SDH with ME and MLS of 1.7cm.  He was evaluated by neurosurgery and was taken to the OR 9/28 for right craniotomy for evacuation.  Post op, PCCM asked to assist with medical management. Post op Hgb noted at 7.1.  1u PRBC ordered and transfusing.  Post op also hypertensive with SBP's 190s.  He was subsequently started on Clevidipine.  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  Significant Hospital Events: Including procedures, antibiotic start and stop dates in addition to other pertinent events   9/28 > admit, to OR for craniotomy and evacuation. 9/29 awake. Oriented but impulsive and inattentive. Strength equal (had left sided weakness pre-op) still on cleviprex gtt. Diet ordered and oral antihypertensive started. Treating hyperkalemia.  PT and OT ordered. Nephroi consulted.   Interim History / Subjective:  Awake. Cooperative but impulsive and has difficulty w/ staying on track w/ conversation. Still on Cleviprex gtt  Objective:  Blood pressure (Abnormal) 157/70, pulse 91, temperature 97.6 F (36.4 C), temperature source Oral, resp. rate (Abnormal) 25, height 6\' 1"  (1.854 m), weight 99 kg, SpO2 94 %.        Intake/Output Summary (Last 24 hours) at 10/06/2020 0742 Last data filed at 10/06/2020 0600 Gross per 24 hour  Intake 2295 ml  Output 523 ml  Net 1772 ml   Filed Weights   10/05/20 1440 10/06/20 0339  Weight: 90.7 kg 99 kg    Examination: General this is a 65 year old male who is s/p right crani for evacuation of SDH Neuro awake. Oriented but is inattentive and impulsive. His strength is now equal. No focal neuro defs noted. The right surgical site drain is intact w/ minimal bloody output HENT NCAT right surg drain intact. Dressing intact. Has immobilizer oin place Pulm clear. No accessory use.  Card rrr Abd not tender Ext warm and dry left AVF w/ good bruit and thrill   Labs/imaging personally reviewed:  CT head 9/28 > subacute R SDH with ME and MLS of 1.7cm.  Resolved Hospital Problem list:    Assessment & Plan:   Subacute R SDH - s/p right craniotomy and evacuation. Plan Serial neuro checks Cont post op keppra and decadron as directed by neuro surg  C-spine images ordered to clear c spine  Drain management per neuro   Hx CKD s/p renal transplant  w/ progressive ESRD  His BUN is 100;  Cr 16.71. these are stable from admit but his creatinine has tripled over last 3 mo.  Plan  Cont mycophenolate and tacrolimus  BP control  Daily chem  Will ask Nephro to see today his K and uremia & acid base imbalance will likely force our hands re: iHD   Acid base and fluid and electrolyte imbalance w/ Anion gap metabolic acidosis, hyperchloremia, and hyperkalemia Plan Asking nephro to see Will give Lokelma  X 1 Check tacro level   Hx HTN, HLD. Plan SBP goal < 160; Cleviprex Resume amlodipine, and labetalol and once off cleviprex will use PRN hydralazine  Hold asa  Leukocytosis suspect reactive Plan Trend wbc and fever curve  Hx DM. Plan Ssi   Acute blood loss anemia - s/p 1u PRBC 9/28. Presumed platelet dyfxn from uremia  Plan Trend cbc Hold ac  Transfuse for hgb < 7  See above re: renal failure  Hx polysubstance abuse. Plan Substance abuse counciling as gets closer to [employer] practice (evaluated daily):  Diet/type: Regular consistency (see orders) DVT prophylaxis: SCD GI prophylaxis: N/A Lines: Arterial Line Foley:  N/A Code Status:  full code Last date of multidisciplinary goals of care discussion: N/A.   Critical care time: 32 min      Erick Colace ACNP-BC [place] Pager # [phone] OR # [phone] if no answer

## 2020-10-06 NOTE — Progress Notes (Signed)
PHARMACY - PHYSICIAN COMMUNICATION CRITICAL VALUE ALERT - BLOOD CULTURE IDENTIFICATION (BCID)  Christopher Chapman is an 64 y.o. male who presented to [facility] on 10/05/2020 with a right subdural hematoma.   Assessment:  1/4 BCx with GPC in clusters. BCID identified coagulase negative staph which is likely contaminant.   Name of physician (or Provider) Contacted: Marni Griffon, NP   Current antibiotics: None  Changes to prescribed antibiotics recommended: Observe off antibiotics  Recommendations accepted by provider  Results for orders placed or performed during the hospital encounter of 10/05/20  Blood Culture ID Panel (Reflexed) (Collected: 10/05/2020  4:45 PM)  Result Value Ref Range   Enterococcus faecalis NOT DETECTED NOT DETECTED   Enterococcus Faecium NOT DETECTED NOT DETECTED   Listeria monocytogenes NOT DETECTED NOT DETECTED   Staphylococcus species DETECTED (A) NOT DETECTED   Staphylococcus aureus (BCID) NOT DETECTED NOT DETECTED   Staphylococcus epidermidis DETECTED (A) NOT DETECTED   Staphylococcus lugdunensis NOT DETECTED NOT DETECTED   Streptococcus species NOT DETECTED NOT DETECTED   Streptococcus agalactiae NOT DETECTED NOT DETECTED   Streptococcus pneumoniae NOT DETECTED NOT DETECTED   Streptococcus pyogenes NOT DETECTED NOT DETECTED   A.calcoaceticus-baumannii NOT DETECTED NOT DETECTED   Bacteroides fragilis NOT DETECTED NOT DETECTED   Enterobacterales NOT DETECTED NOT DETECTED   Enterobacter cloacae complex NOT DETECTED NOT DETECTED   Escherichia coli NOT DETECTED NOT DETECTED   Klebsiella aerogenes NOT DETECTED NOT DETECTED   Klebsiella oxytoca NOT DETECTED NOT DETECTED   Klebsiella pneumoniae NOT DETECTED NOT DETECTED   Proteus species NOT DETECTED NOT DETECTED   Salmonella species NOT DETECTED NOT DETECTED   Serratia marcescens NOT DETECTED NOT DETECTED   Haemophilus influenzae NOT DETECTED NOT DETECTED   Neisseria meningitidis NOT DETECTED NOT DETECTED    Pseudomonas aeruginosa NOT DETECTED NOT DETECTED   Stenotrophomonas maltophilia NOT DETECTED NOT DETECTED   Candida albicans NOT DETECTED NOT DETECTED   Candida auris NOT DETECTED NOT DETECTED   Candida glabrata NOT DETECTED NOT DETECTED   Candida krusei NOT DETECTED NOT DETECTED   Candida parapsilosis NOT DETECTED NOT DETECTED   Candida tropicalis NOT DETECTED NOT DETECTED   Cryptococcus neoformans/gattii NOT DETECTED NOT DETECTED   Methicillin resistance mecA/C NOT DETECTED NOT DETECTED    Albertina Parr, PharmD., BCPS, BCCCP Clinical Pharmacist Please refer to [facility] for unit-specific pharmacist

## 2020-10-06 NOTE — Progress Notes (Addendum)
Requested to see pt for out-pt HD arrangements. Met with pt and pt's mother at bedside. Pt states hx with [facility] and would like to use them again. Referral made to [facility] admissions this afternoon. Pt may need transportation assistance at d/c. Alerted CSW of this possible need. Per pt, pt drove prior to admission. Pt's mother and brother of whom pt lives with do not drive. Will follow and assist.   Melven Sartorius Renal Navigator  [phone]

## 2020-10-06 NOTE — Consult Note (Signed)
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 Creatinine was already in high 5's range in 03/17/20 and he was counseled that he was nearing ESRD again but missed his f/u appt 2 mths later. He is almost certainly ESRD again now; h/o noncompliance and currently with poor social support thus may not be a candidate for another transplant. - For now will need to restart dialysis and wrote orders for today and tomorrow with lower flows to decrease risk of dialysis dysequilibrium.  - Starting CLIP process (renal navigator notified) - Dose medications for ESRD and avoid nephrotoxic agents as he still has a little residual renal function. - Continue immunosuppressives for now until everything is sorted out but appears that he may not have the social support for a 2nd transplant. Therefore alloimmunization may be less of a concern.  Renal osteodystrophy - will check a phos and PTH for binder management. Anemia  - Will check an iron panel with tomorrow's labs prehd to see if he needs to be loaded. + ESA as needed HTN - currently on Clevipres gtt but that will be titrated off with oral antihypertensives + UF from HD. SDH - s/p evacuation 9/28. H/o polysubstance abuse   HPI: Christopher Chapman is an 65 y.o. male HTN, prediabetes, gout, HLD, polysubstance abuse, ESRD secondary to hypertension on hemodialysis at [facility]  for 5 years before receiving a DDRT  at [facility] on 09/13/16; he is followed by Dr. Moshe Cipro with [facility]. He had Campath induction with cold ischemia time 17 hours, he did require a couple of post transplant dialysis sessions. An allograft biopsy was done on 11/07/16 with mild ATN and no evidence of rejection, minimal interstitial fibrosis and tubular atrophy. 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 He was treated transiently with Valcyte and CellCept may have been decreased at that time.  It seems his creatinine had run from 1.5 to 1.7,  1.7 in August 2019 w/ Prograf goal is between 4 and 6.  He also had a hospitalization in April 2021 w/ allograft biopsy at that time showed rejection which was treated-creatinine shortly thereafter was 2.6; cr was 3.5 09/2019 at a transplant evaluation with noted poorly controlled diabetes. Labs completed at PCP 03/17/20 with SCR 5.76, Co2 19, K 4.8 and GFR 10. Last visit with Dr. Moshe Cipro was in 05/2020. He was starting to have fluid retention already back in 05/2020 and was counseled that he is moving towards being ESRD requiring dialysis again but did not f/u 2 months later. Patient now found down for unknown period of time possibly as long as 4 days but denied headaches, nausea, vomiting, dizziness. Patient was found to have a large right sided chronic subdural hematoma with 1.7 cm midline shift s/p rt craniotomy with evacuation on 9/28. BN/Cr 100/16.7.   ROS Pertinent items are noted in HPI.  Chemistry and CBC: Creatinine, Ser  Date/Time Value Ref Range Status  10/06/2020 05:39 AM 16.71 (H) 0.61 - 1.24 mg/dL Final  10/05/2020 03:53 PM 17.95 (H) 0.61 - 1.24 mg/dL Final  03/15/2020 03:57 PM 5.76 (H) 0.76 - 1.27 mg/dL Final  07/16/2011 09:01 AM 10.30 (H) 0.50 - 1.35 mg/dL Final  05/05/2009 08:42 PM 6.78 (H) 0.40 - 1.50 mg/dL Final    Comment:    See lab report for associated comment(s)  09/15/2008  02:28 AM 5.76 (H) 0.40 - 1.50 mg/dL Final    Comment:    See lab report for associated comment(s)  12/03/2007 09:11 PM 3.76 (H) 0.40 - 1.50 mg/dL Final    Comment:    See lab report for associated comment(s)  06/06/2007 11:34 PM 2.98 (H) 0.40 - 1.50 mg/dL Final    Comment:    See lab report for associated comment(s)  10/01/2006 10:41 PM 2.92 (H) 0.40 - 1.50 mg/dL Final    Comment:    See lab report for associated comment(s)  08/14/2006 10:13 PM 3.37  (H) 0.40 - 1.50 mg/dL Final    Comment:    See lab report for associated comment(s)   Recent Labs  Lab 10/05/20 1553 10/05/20 1943 10/05/20 2024 10/06/20 0539  NA 140 144 146* 140  K 5.4* 5.2* 4.8 5.2*  CL 111  --   --  112*  CO2 13*  --   --  10*  GLUCOSE 93  --   --  127*  BUN 102*  --   --  100*  CREATININE 17.95*  --   --  16.71*  CALCIUM 7.5*  --   --  7.5*  PHOS  --   --   --  8.8*   Recent Labs  Lab 10/05/20 1553 10/05/20 1943 10/05/20 2024 10/06/20 0539  WBC 10.4  --   --  17.9*  NEUTROABS 8.0*  --   --   --   HGB 9.6* 9.2* 7.1* 10.3*  HCT 33.1* 27.0* 21.0* 33.2*  MCV 92.2  --   --  89.0  PLT 233  --   --  221   Liver Function Tests: Recent Labs  Lab 10/05/20 1553  AST 16  ALT 13  ALKPHOS 70  BILITOT 0.9  PROT 7.4  ALBUMIN 3.0*   Recent Labs  Lab 10/05/20 1553  LIPASE 49   Recent Labs  Lab 10/05/20 1645  AMMONIA 37*   Cardiac Enzymes: Recent Labs  Lab 10/05/20 1553  CKTOTAL 511*   Iron Studies: No results for input(s): IRON, TIBC, TRANSFERRIN, FERRITIN in the last 72 hours. PT/INR: _0 (inr:5)  Xrays/Other Studies: ) Results for orders placed or performed during the hospital encounter of 10/05/20 (from the past 48 hour(s))  Lactic acid, plasma     Status: None   Collection Time: 10/05/20  3:26 PM  Result Value Ref Range   Lactic Acid, Venous 1.2 0.5 - 1.9 mmol/L    Comment: Performed at [facility], [place] [address], [place], [place] [address]  TSH     Status: None   Collection Time: 10/05/20  3:30 PM  Result Value Ref Range   TSH 0.922 0.350 - 4.500 uIU/mL    Comment: Performed by a 3rd Generation assay with a functional sensitivity of <=0.01 uIU/mL. Performed at [facility], [place] [address] [place], [place] [address]   CBC with Differential     Status: Abnormal   Collection Time: 10/05/20  3:53 PM  Result Value Ref Range   WBC 10.4 4.0 - 10.5 K/uL   RBC 3.59 (L) 4.22 - 5.81 MIL/uL   Hemoglobin 9.6 (L) 13.0  - 17.0 g/dL   HCT 33.1 (L) 39.0 - 52.0 %   MCV 92.2 80.0 - 100.0 fL   MCH 26.7 26.0 - 34.0 pg   MCHC 29.0 (L) 30.0 - 36.0 g/dL   RDW 17.2 (H) 11.5 - 15.5 %   Platelets 233 150 - 400 K/uL  nRBC 0.0 0.0 - 0.2 %   Neutrophils Relative % 78 %   Neutro Abs 8.0 (H) 1.7 - 7.7 K/uL   Lymphocytes Relative 14 %   Lymphs Abs 1.5 0.7 - 4.0 K/uL   Monocytes Relative 7 %   Monocytes Absolute 0.7 0.1 - 1.0 K/uL   Eosinophils Relative 1 %   Eosinophils Absolute 0.1 0.0 - 0.5 K/uL   Basophils Relative 0 %   Basophils Absolute 0.0 0.0 - 0.1 K/uL   Immature Granulocytes 0 %   Abs Immature Granulocytes 0.04 0.00 - 0.07 K/uL    Comment: Performed at [facility] [address], [place], [place] [address]  Comprehensive metabolic panel     Status: Abnormal   Collection Time: 10/05/20  3:53 PM  Result Value Ref Range   Sodium 140 135 - 145 mmol/L   Potassium 5.4 (H) 3.5 - 5.1 mmol/L   Chloride 111 98 - 111 mmol/L   CO2 13 (L) 22 - 32 mmol/L   Glucose, Bld 93 70 - 99 mg/dL    Comment: Glucose reference range applies only to samples taken after fasting for at least 8 hours.   BUN 102 (H) 8 - 23 mg/dL   Creatinine, Ser 17.95 (H) 0.61 - 1.24 mg/dL   Calcium 7.5 (L) 8.9 - 10.3 mg/dL   Total Protein 7.4 6.5 - 8.1 g/dL   Albumin 3.0 (L) 3.5 - 5.0 g/dL   AST 16 15 - 41 U/L   ALT 13 0 - 44 U/L   Alkaline Phosphatase 70 38 - 126 U/L   Total Bilirubin 0.9 0.3 - 1.2 mg/dL   GFR, Estimated 3 (L) >60 mL/min    Comment: (NOTE) Calculated using the CKD-EPI Creatinine Equation (2021)    Anion gap 16 (H) 5 - 15    Comment: Performed at [facility], [place] [address] [place], [place] [address]  Lipase, blood     Status: None   Collection Time: 10/05/20  3:53 PM  Result Value Ref Range   Lipase 49 11 - 51 U/L    Comment: Performed at [facility] [address] [place], [place] [address]  CK     Status: Abnormal   Collection Time: 10/05/20  3:53 PM  Result Value Ref Range   Total CK  511 (H) 49 - 397 U/L    Comment: Performed at [facility], [place] [address] [place], [place] [address]  ABO/Rh     Status: None   Collection Time: 10/05/20  3:53 PM  Result Value Ref Range   ABO/RH(D)      AB POS Performed at [facility] [address] [place], [place] [address]   Troponin I (High Sensitivity)     Status: Abnormal   Collection Time: 10/05/20  3:54 PM  Result Value Ref Range   Troponin I (High Sensitivity) 66 (H) <18 ng/L    Comment: (NOTE) Elevated high sensitivity troponin I (hsTnI) values and significant  changes across serial measurements may suggest ACS but many other  chronic and acute conditions are known to elevate hsTnI results.  Refer to the "Links" section for chest pain algorithms and additional  guidance. Performed at [facility], [place] [address], [place], [place] [address]   Lactic acid, plasma     Status: None   Collection Time: 10/05/20  4:45 PM  Result Value Ref Range   Lactic Acid, Venous 1.0 0.5 - 1.9 mmol/L  Comment: Performed at [facility], [place] [address] [place], [place] [address]  Ammonia     Status: Abnormal   Collection Time: 10/05/20  4:45 PM  Result Value Ref Range   Ammonia 37 (H) 9 - 35 umol/L    Comment: Performed at [facility], [place] [address] [place], [place] [address]  Resp Panel by RT-PCR (Flu A&B, Covid) Nasopharyngeal Swab     Status: None   Collection Time: 10/05/20  4:50 PM   Specimen: Nasopharyngeal Swab; Nasopharyngeal(NP) swabs in vial transport medium  Result Value Ref Range   SARS Coronavirus 2 by RT PCR NEGATIVE NEGATIVE    Comment: (NOTE) SARS-CoV-2 target nucleic acids are NOT DETECTED.  The SARS-CoV-2 RNA is generally detectable in upper respiratory specimens during the acute phase of infection. The lowest concentration of SARS-CoV-2 viral copies this assay can detect is 138 copies/mL. A negative result does not preclude SARS-Cov-2 infection and should not be used as the  sole basis for treatment or other patient management decisions. A negative result may occur with  improper specimen collection/handling, submission of specimen other than nasopharyngeal swab, presence of viral mutation(s) within the areas targeted by this assay, and inadequate number of viral copies(<138 copies/mL). A negative result must be combined with clinical observations, patient history, and epidemiological information. The expected result is Negative.  Fact Sheet for Patients:  EntrepreneurPulse.com.au  Fact Sheet for Healthcare Providers:  IncredibleEmployment.be  This test is no t yet approved or cleared by the [place] FDA and  has been authorized for detection and/or diagnosis of SARS-CoV-2 by FDA under an Emergency Use Authorization (EUA). This EUA will remain  in effect (meaning this test can be used) for the duration of the COVID-19 declaration under Section 564(b)(1) of the Act, 21 U.S.C.section 360bbb-3(b)(1), unless the authorization is terminated  or revoked sooner.       Influenza A by PCR NEGATIVE NEGATIVE   Influenza B by PCR NEGATIVE NEGATIVE    Comment: (NOTE) The Xpert Xpress SARS-CoV-2/FLU/RSV plus assay is intended as an aid in the diagnosis of influenza from Nasopharyngeal swab specimens and should not be used as a sole basis for treatment. Nasal washings and aspirates are unacceptable for Xpert Xpress SARS-CoV-2/FLU/RSV testing.  Fact Sheet for Patients: EntrepreneurPulse.com.au  Fact Sheet for Healthcare Providers: IncredibleEmployment.be  This test is not yet approved or cleared by the [place] FDA and has been authorized for detection and/or diagnosis of SARS-CoV-2 by FDA under an Emergency Use Authorization (EUA). This EUA will remain in effect (meaning this test can be used) for the duration of the COVID-19 declaration under Section 564(b)(1) of the Act, 21  U.S.C. section 360bbb-3(b)(1), unless the authorization is terminated or revoked.  Performed at [facility], [place] [address], [place], [place] [address]   Glucose, capillary     Status: None   Collection Time: 10/05/20  6:59 PM  Result Value Ref Range   Glucose-Capillary 77 70 - 99 mg/dL    Comment: Glucose reference range applies only to samples taken after fasting for at least 8 hours.  Type and screen [place]     Status: None (Preliminary result)   Collection Time: 10/05/20  7:05 PM  Result Value Ref Range   ABO/RH(D) AB POS    Antibody Screen NEG    Sample Expiration 10/08/2020,2359    Unit Number [id number]    Blood Component Type RED CELLS,LR    Unit division 00  Status of Unit ALLOCATED    Transfusion Status OK TO TRANSFUSE    Crossmatch Result Compatible    Unit Number [id number]    Blood Component Type RBC LR PHER2    Unit division 00    Status of Unit ISSUED,FINAL    Transfusion Status OK TO TRANSFUSE    Crossmatch Result      Compatible Performed at [facility], [place] [address], [place], [place] [address]   Prepare RBC (crossmatch)     Status: None   Collection Time: 10/05/20  7:05 PM  Result Value Ref Range   Order Confirmation      ORDER PROCESSED BY BLOOD BANK Performed at [facility], [place] [address], [place], [place] [address]   I-STAT 7, (LYTES, BLD GAS, ICA, H+H)     Status: Abnormal   Collection Time: 10/05/20  7:43 PM  Result Value Ref Range   pH, Arterial 7.200 (L) 7.350 - 7.450   pCO2 arterial 40.1 32.0 - 48.0 mmHg   pO2, Arterial 534 (H) 83.0 - 108.0 mmHg   Bicarbonate 15.9 (L) 20.0 - 28.0 mmol/L   TCO2 17 (L) 22 - 32 mmol/L   O2 Saturation 100.0 %   Acid-base deficit 12.0 (H) 0.0 - 2.0 mmol/L   Sodium 144 135 - 145 mmol/L   Potassium 5.2 (H) 3.5 - 5.1 mmol/L   Calcium, Ion 0.99 (L) 1.15 - 1.40 mmol/L   HCT 27.0 (L) 39.0 - 52.0 %   Hemoglobin 9.2 (L) 13.0 - 17.0 g/dL   Patient temperature 36.0  C    Sample type ARTERIAL    Comment NOTIFIED PHYSICIAN   Protime-INR     Status: None   Collection Time: 10/05/20  8:10 PM  Result Value Ref Range   Prothrombin Time 14.4 11.4 - 15.2 seconds   INR 1.1 0.8 - 1.2    Comment: (NOTE) INR goal varies based on device and disease states. Performed at [facility], [place] [address], [place], [place] [address]   I-STAT 7, (LYTES, BLD GAS, ICA, H+H)     Status: Abnormal   Collection Time: 10/05/20  8:24 PM  Result Value Ref Range   pH, Arterial 7.243 (L) 7.350 - 7.450   pCO2 arterial 40.3 32.0 - 48.0 mmHg   pO2, Arterial 519 (H) 83.0 - 108.0 mmHg   Bicarbonate 17.7 (L) 20.0 - 28.0 mmol/L   TCO2 19 (L) 22 - 32 mmol/L   O2 Saturation 100.0 %   Acid-base deficit 9.0 (H) 0.0 - 2.0 mmol/L   Sodium 146 (H) 135 - 145 mmol/L   Potassium 4.8 3.5 - 5.1 mmol/L   Calcium, Ion 0.97 (L) 1.15 - 1.40 mmol/L   HCT 21.0 (L) 39.0 - 52.0 %   Hemoglobin 7.1 (L) 13.0 - 17.0 g/dL   Patient temperature 35.5 C    Sample type ARTERIAL   Glucose, capillary     Status: Abnormal   Collection Time: 10/05/20  9:28 PM  Result Value Ref Range   Glucose-Capillary 121 (H) 70 - 99 mg/dL    Comment: Glucose reference range applies only to samples taken after fasting for at least 8 hours.  Glucose, capillary     Status: Abnormal   Collection Time: 10/05/20 10:26 PM  Result Value Ref Range   Glucose-Capillary 109 (H) 70 - 99 mg/dL    Comment: Glucose reference range applies only to samples taken after fasting for at least 8 hours.  HIV Antibody (routine testing  w rflx)     Status: None   Collection Time: 10/05/20 11:04 PM  Result Value Ref Range   HIV Screen 4th Generation wRfx Non Reactive Non Reactive    Comment: Performed at [facility], [place] [address] [place], [place] [address]  Hemoglobin A1c     Status: None   Collection Time: 10/05/20 11:04 PM  Result Value Ref Range   Hgb A1c MFr Bld 5.6 4.8 - 5.6 %    Comment: (NOTE) Pre diabetes:           5.7%-6.4%  Diabetes:              >6.4%  Glycemic control for   <7.0% adults with diabetes    Mean Plasma Glucose 114.02 mg/dL    Comment: Performed at [facility] [address] [place], [place] [address]  MRSA Next Gen by PCR, Nasal     Status: None   Collection Time: 10/05/20 11:08 PM   Specimen: Nasal Mucosa; Nasal Swab  Result Value Ref Range   MRSA by PCR Next Gen NOT DETECTED NOT DETECTED    Comment: (NOTE) The GeneXpert MRSA Assay (FDA approved for NASAL specimens only), is one component of a comprehensive MRSA colonization surveillance program. It is not intended to diagnose MRSA infection nor to guide or monitor treatment for MRSA infections. Test performance is not FDA approved in patients less than 66 years old. Performed at [facility], [place] [address] [place], [place] [address]   CBC     Status: Abnormal   Collection Time: 10/06/20  5:39 AM  Result Value Ref Range   WBC 17.9 (H) 4.0 - 10.5 K/uL   RBC 3.73 (L) 4.22 - 5.81 MIL/uL   Hemoglobin 10.3 (L) 13.0 - 17.0 g/dL   HCT 33.2 (L) 39.0 - 52.0 %   MCV 89.0 80.0 - 100.0 fL   MCH 27.6 26.0 - 34.0 pg   MCHC 31.0 30.0 - 36.0 g/dL   RDW 16.7 (H) 11.5 - 15.5 %   Platelets 221 150 - 400 K/uL   nRBC 0.0 0.0 - 0.2 %    Comment: Performed at [facility], [place] [address], [place], [place] [address]  Basic metabolic panel     Status: Abnormal   Collection Time: 10/06/20  5:39 AM  Result Value Ref Range   Sodium 140 135 - 145 mmol/L   Potassium 5.2 (H) 3.5 - 5.1 mmol/L   Chloride 112 (H) 98 - 111 mmol/L   CO2 10 (L) 22 - 32 mmol/L   Glucose, Bld 127 (H) 70 - 99 mg/dL    Comment: Glucose reference range applies only to samples taken after fasting for at least 8 hours.   BUN 100 (H) 8 - 23 mg/dL   Creatinine, Ser 16.71 (H) 0.61 - 1.24 mg/dL   Calcium 7.5 (L) 8.9 - 10.3 mg/dL   GFR, Estimated 3 (L) >60 mL/min    Comment: (NOTE) Calculated using the CKD-EPI Creatinine Equation (2021)    Anion gap  18 (H) 5 - 15    Comment: Performed at [facility] [address], [place], [place] [address]  Magnesium     Status: None   Collection Time: 10/06/20  5:39 AM  Result Value Ref Range   Magnesium 2.1 1.7 - 2.4 mg/dL    Comment: Performed at [facility] [address] [place], [place] [address]  Phosphorus     Status:  Abnormal   Collection Time: 10/06/20  5:39 AM  Result Value Ref Range   Phosphorus 8.8 (H) 2.5 - 4.6 mg/dL    Comment: Performed at [facility] [address], [place], [place] [address]  Hemoglobin A1c     Status: None   Collection Time: 10/06/20  5:39 AM  Result Value Ref Range   Hgb A1c MFr Bld 5.6 4.8 - 5.6 %    Comment: (NOTE) Pre diabetes:          5.7%-6.4%  Diabetes:              >6.4%  Glycemic control for   <7.0% adults with diabetes    Mean Plasma Glucose 114.02 mg/dL    Comment: Performed at [facility] [address] [place], [place] [address]  Glucose, capillary     Status: Abnormal   Collection Time: 10/06/20  7:04 AM  Result Value Ref Range   Glucose-Capillary 129 (H) 70 - 99 mg/dL    Comment: Glucose reference range applies only to samples taken after fasting for at least 8 hours.   CT HEAD WO CONTRAST  Result Date: 10/06/2020 CLINICAL DATA:  Follow-up subdural hemorrhage EXAM: CT HEAD WITHOUT CONTRAST TECHNIQUE: Contiguous axial images were obtained from the base of the skull through the vertex without intravenous contrast. COMPARISON:  Head CT 10/05/2020 FINDINGS: Brain: There has been interval right parietal craniotomy for evacuation of a subdural hematoma with a drainage catheter left in place terminating in the right parietal subdural space. The mixed density right subdural hematoma has decreased in size from 2.6 cm on the prior study to up to 1.1 cm on the current study. Hyperdense blood within the collection may reflect redistribution of acute blood products or new blood. There is postoperative pneumocephalus  within the collection as well as postoperative soft tissue gas and swelling in the scalp. There is significantly decreased mass effect on the underlying brain with decreased effacement of the right lateral ventricle and improved leftward midline shift, currently measuring up to 6 mm at the level of the foramen of Monro, previously measured up to 1.3 cm at a similar level. There is crowding of the right ambient cistern, also improved. The remaining basal cisterns are patent. There is hypodensity in the left occipital lobe not seen on the prior study. Vascular: No hyperdense vessel or unexpected calcification. Skull: As above, there has been interval right parietal craniotomy. There is a remote fracture of the left lamina papyracea. Sinuses/Orbits: Stable. Other: None. IMPRESSION: 1. Interval right parietal craniotomy for drainage of a right holoconvexity subdural hematoma with a drainage catheter left in place terminating over the right parietal lobe. The hematoma has significantly decreased in size, now measuring up to 1.1 cm in maximal thickness, with decreased leftward midline shift now measuring 6 mm. 2. Hypodensity in the left occipital lobe which may be artifactual. Attention on follow-up head CTs. Electronically Signed   By: Valetta Mole M.D.   On: 10/06/2020 08:31   CT HEAD WO CONTRAST (5MM)  Result Date: 10/05/2020 CLINICAL DATA:  [place], found down, left arm and left leg weakness EXAM: CT HEAD WITHOUT CONTRAST TECHNIQUE: Contiguous axial images were obtained from the base of the skull through the vertex without intravenous contrast. COMPARISON:  07/06/2003 FINDINGS: Brain: There is a subacute right-sided subdural hematoma measuring up to 2.6 cm in maximal thickness. The majority of the subdural collection is decreased in attenuation, with scattered dependent higher attenuation more acute blood products  identified posteriorly. There is significant mass effect and leftward midline shift, measuring  approximately 1.7 cm at the level of the septum pellucidum. No evidence of acute infarct. Effacement of the lateral ventricles due to mass effect. Remaining midline structures are unremarkable. Vascular: No hyperdense vessel or unexpected calcification. Skull: Normal. Negative for fracture or focal lesion. Sinuses/Orbits: No acute finding. Invagination of the left lamina papyracea consistent with chronic injury. Other: None. IMPRESSION: 1. Subacute right-sided subdural hematoma with significant mass effect and leftward midline shift measuring 1.7 cm. 2. No acute infarct. Critical Value/emergent results were called by telephone at the time of interpretation on 10/05/2020 at 5:55 pm to provider JOSEPH ZAMMIT, who verbally acknowledged these results. Electronically Signed   By: Randa Ngo M.D.   On: 10/05/2020 17:59   CT Cervical Spine Wo Contrast  Result Date: 10/05/2020 CLINICAL DATA:  Found down, left-sided weakness EXAM: CT CERVICAL SPINE WITHOUT CONTRAST TECHNIQUE: Multidetector CT imaging of the cervical spine was performed without intravenous contrast. Multiplanar CT image reconstructions were also generated. COMPARISON:  None. FINDINGS: Alignment: Alignment is anatomic. Skull base and vertebrae: No acute fracture. No primary bone lesion or focal pathologic process. Soft tissues and spinal canal: No prevertebral fluid or swelling. No visible canal hematoma. Prominent atherosclerosis at the carotid bifurcations. Disc levels:  Mild spondylosis at C5-6 and C6-7. Upper chest: Central airway is patent.  Lung apices are clear. Other: Reconstructed images demonstrate no additional findings. IMPRESSION: 1. No acute cervical spine fracture. Electronically Signed   By: Randa Ngo M.D.   On: 10/05/2020 17:58   DG Chest Portable 1 View  Result Date: 10/05/2020 CLINICAL DATA:  New left arm and leg weakness, coarse breath sounds EXAM: PORTABLE CHEST 1 VIEW COMPARISON:  Chest radiograph 06/15/2015 FINDINGS: The  heart is borderline enlarged. The mediastinal contours are within normal limits. The lungs are well inflated. There is no focal consolidation or pulmonary edema. There is no pleural effusion or pneumothorax. There is no acute osseous abnormality. IMPRESSION: Borderline cardiomegaly. Otherwise, no radiographic evidence of acute cardiopulmonary process. Electronically Signed   By: Valetta Mole M.D.   On: 10/05/2020 16:43    PMH:   Past Medical History:  Diagnosis Date   Anemia    Arthritis    CRF (chronic renal failure)    ESRD (end stage renal disease) ([place])    Family history of anesthesia complication    [place] going to sleep.   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    PSH:   Past Surgical History:  Procedure Laterality Date   ANGIOPLASTY  08/09/2011   Procedure: ANGIOPLASTY;  Surgeon: Angelia Mould, MD;  Location: [place] NEURO ORS;  Service: Vascular;  Laterality: Left;  Left Braciocephalic Fistula using Vascu-Guard Patch   APPENDECTOMY  1980   [place] Left 2010   CRANIOTOMY Right 10/05/2020   Procedure: CRANIOTOMY HEMATOMA EVACUATION SUBDURAL;  Surgeon: [name], MD;  Location: [place];  Service: Neurosurgery;  Laterality: Right;   INCISION AND DRAINAGE  12/2012   sebacebus cyst, infected   MANDIBLE FRACTURE SURGERY     [name] N/A 07/16/2011   Procedure: Earney Mallet;  Surgeon: Harrell Gave  Nicole Cella, MD;  Location: [facility] CATH LAB;  Service: Cardiovascular;  Laterality: N/A;    Allergies: No Known Allergies  Medications:   Prior to Admission medications   Medication Sig Start Date End Date Taking? Authorizing Provider  atorvastatin (LIPITOR) 40 MG tablet Take 1 tablet (40 mg total) by mouth daily. 03/16/20  03/16/21  [name], MD  ACCU-CHEK AVIVA PLUS test strip USE 1 STRIP DAILY TO TEST BLOOD SUGAR 11/15/16   [provider]  allopurinol (ZYLOPRIM) 100 MG tablet Take 200 mg by mouth daily.  05/18/11   [provider]  amLODipine (NORVASC) 5 MG tablet Take 1 tablet (5 mg total) by mouth daily. 03/15/20 03/15/21  [name], MD  ASPIRIN LOW DOSE 81 MG EC tablet TAKE 1 TABLET(81 MG) BY MOUTH DAILY. SWALLOW WHOLE 06/27/20   [name], MD  Blood Glucose Monitoring Suppl (ACCU-CHEK AVIVA PLUS) w/Device KIT USE AS INSTRUCTED; PLEASE CHECK BLOOD SUGAR ONCE A DAY 1 2 HRS AFTER MEALS 11/15/16   [provider]  labetalol (NORMODYNE) 300 MG tablet Take 1 tablet (300 mg total) by mouth 3 (three) times daily. 03/15/20   [name], MD  Lancets (ACCU-CHEK MULTICLIX) lancets Inject into the skin. 12/27/16   [provider]  metroNIDAZOLE (FLAGYL) 500 MG tablet Take 1 tablet (500 mg total) by mouth 2 (two) times daily. 08/10/20   [name], MD  mupirocin ointment (BACTROBAN) 2 % Apply 1 application topically 3 (three) times daily. 10/03/17   [name], MD  mycophenolate (MYFORTIC) 180 MG EC tablet Take 360 mg by mouth 2 (two) times daily. 07/03/17   [provider]  tacrolimus (PROGRAF) 1 MG capsule Take 4 mg by mouth 2 (two) times daily.  01/24/17   [provider]    Discontinued Meds:   Medications Discontinued During This Encounter  Medication Reason   lactated ringers infusion    bacitracin ointment Patient Discharge   hemostatic agents (no charge) Optime Patient Discharge   Surgifoam 100 with Thrombin 20,000 units (20 ml) topical solution Patient Discharge   Surgifoam 1 Gm with Thrombin 5,000 units (5 ml) topical solution Patient Discharge   0.9 % irrigation (POUR BTL) Patient Discharge   lidocaine-EPINEPHrine (XYLOCAINE W/EPI) 2 %-1:100000 (with pres) injection Patient Discharge   midazolam (VERSED) injection 0.5-2 mg     fentaNYL (SUBLIMAZE) injection 25-50 mcg    0.9 %  sodium chloride infusion Patient Transfer   oxyCODONE (Oxy IR/ROXICODONE) immediate release tablet 5 mg Patient Transfer   oxyCODONE (ROXICODONE) 5 MG/5ML solution 5 mg Patient Transfer   meperidine (DEMEROL) injection 6.25-12.5 mg Patient Transfer   promethazine (PHENERGAN) injection 6.25-12.5 mg Patient Transfer   0.9 %  sodium chloride infusion (Manually program via Guardrails IV Fluids)    ceFAZolin (ANCEF) IVPB 1 g/50 mL premix Lab parameters not met   menthol-cetylpyridinium (CEPACOL) lozenge 3 mg     Social History:  reports that he has been smoking cigarettes. He started smoking about 4 years ago. He has a 3.00 pack-year smoking history. He has never used smokeless tobacco. He reports current alcohol use. He reports that he does not use drugs.  Family History:   Family History  Problem Relation Age of Onset   Diabetes Father    Heart disease Father    Hypertension Father    Other Father        amputation   Healthy Mother    Diabetes Sister    Heart disease Sister    Colon  cancer Neg Hx    Stomach cancer Neg Hx    Rectal cancer Neg Hx    Esophageal cancer Neg Hx    Liver cancer Neg Hx    Colon polyps Neg Hx     Blood pressure (!) 155/87, pulse 99, temperature 97.6 F (36.4 C), temperature source Oral, resp. rate (!) 25, height _0  (1.854 m), weight 99 kg, SpO2 95 %.  General Appearance: cooperative, no distress, very pleasant, clear speech Head: Normocephalic, drain in the right side Eyes: PERRL, conjunctiva/corneas clear Lungs: respirations unlabored Heart: Regular rate and rhythm Extremities: Extremities normal, atraumatic, no cyanosis or edema Pulses: 2+ and symmetric all extremities Skin: no rashes or lesions Access: lt BCF with good thrill       Dwana Melena, MD 10/06/2020, 11:41 AM

## 2020-10-06 NOTE — Progress Notes (Signed)
Pt drain no longer working.No changes to neuro status. Margo Aye NP notified. No changes to drain at this time. Orders given to D/C C collar.

## 2020-10-06 NOTE — TOC CAGE-AID Note (Signed)
Transition of Care [facility]) - CAGE-AID Screening   Patient Details  Name: Christopher Chapman MRN: [id number] Date of Birth: 05-Sep-1955  Transition of Care [facility]) CM/SW Contact:    Gaetano Hawthorne Tarpley-Carter, [place] Phone Number: 10/06/2020, 2:24 PM   Clinical Narrative: Pt participated in [place].  Pt stated he does not use substance or ETOH.  Pt was not offered resources, due to no usage of substance or ETOH.     Jaymie Mckiddy Tarpley-Carter, MSW, LCSW-A Pronouns:  She/Her/Hers [facility] Health[facility]itions of Care Clinical Social Worker Direct Number:  [phone] [email or web address]   CAGE-AID Screening:    Have You Ever Felt You Ought to Cut Down on Your Drinking or Drug Use?: No Have People Annoyed You By [employer] Your Drinking Or Drug Use?: No Have You Felt Bad Or Guilty About Your Drinking Or Drug Use?: No Have You Ever Had a Drink or Used Drugs First Thing In The Morning to Steady Your Nerves or to Get Rid of a Hangover?: No CAGE-AID Score: 0  Substance Abuse Education Offered: No

## 2020-10-06 NOTE — Progress Notes (Signed)
Doing well, c/o headache, moves everything well, dressing dry, hCT looks much better. Mobilize, clear c-spine, PT/OT

## 2020-10-06 NOTE — Progress Notes (Signed)
Occupational Therapy evaluation completed with full note to follow.  Pt will likely benefit from post acute rehab at discharge - currently recommend CIR.   Nilsa Nutting., OTR/L Acute Rehabilitation Services Pager [phone] Office [phone]

## 2020-10-07 DIAGNOSIS — R4182 Altered mental status, unspecified: Secondary | ICD-10-CM | POA: Diagnosis not present

## 2020-10-07 LAB — CULTURE, BLOOD (ROUTINE X 2)

## 2020-10-07 LAB — HEPATITIS B SURFACE ANTIGEN: Hepatitis B Surface Ag: NONREACTIVE

## 2020-10-07 LAB — CBC
HCT: 27.9 % — ABNORMAL LOW (ref 39.0–52.0)
Hemoglobin: 8.9 g/dL — ABNORMAL LOW (ref 13.0–17.0)
MCH: 27.5 pg (ref 26.0–34.0)
MCHC: 31.9 g/dL (ref 30.0–36.0)
MCV: 86.1 fL (ref 80.0–100.0)
Platelets: 188 10*3/uL (ref 150–400)
RBC: 3.24 MIL/uL — ABNORMAL LOW (ref 4.22–5.81)
RDW: 16.4 % — ABNORMAL HIGH (ref 11.5–15.5)
WBC: 15.5 10*3/uL — ABNORMAL HIGH (ref 4.0–10.5)
nRBC: 0.1 % (ref 0.0–0.2)

## 2020-10-07 LAB — GLUCOSE, CAPILLARY
Glucose-Capillary: 132 mg/dL — ABNORMAL HIGH (ref 70–99)
Glucose-Capillary: 132 mg/dL — ABNORMAL HIGH (ref 70–99)
Glucose-Capillary: 193 mg/dL — ABNORMAL HIGH (ref 70–99)
Glucose-Capillary: 177 mg/dL — ABNORMAL HIGH (ref 70–99)

## 2020-10-07 LAB — COMPREHENSIVE METABOLIC PANEL
ALT: 11 U/L (ref 0–44)
AST: 19 U/L (ref 15–41)
Albumin: 2.6 g/dL — ABNORMAL LOW (ref 3.5–5.0)
Alkaline Phosphatase: 56 U/L (ref 38–126)
Anion gap: 15 (ref 5–15)
BUN: 111 mg/dL — ABNORMAL HIGH (ref 8–23)
CO2: 12 mmol/L — ABNORMAL LOW (ref 22–32)
Calcium: 7.1 mg/dL — ABNORMAL LOW (ref 8.9–10.3)
Chloride: 107 mmol/L (ref 98–111)
Creatinine, Ser: 17.18 mg/dL — ABNORMAL HIGH (ref 0.61–1.24)
GFR, Estimated: 3 mL/min — ABNORMAL LOW (ref >60)
Glucose, Bld: 133 mg/dL — ABNORMAL HIGH (ref 70–99)
Potassium: 5.6 mmol/L — ABNORMAL HIGH (ref 3.5–5.1)
Sodium: 134 mmol/L — ABNORMAL LOW (ref 135–145)
Total Bilirubin: 0.6 mg/dL (ref 0.3–1.2)
Total Protein: 6.4 g/dL — ABNORMAL LOW (ref 6.5–8.1)

## 2020-10-07 LAB — IRON AND TIBC
Iron: 44 ug/dL — ABNORMAL LOW (ref 45–182)
Saturation Ratios: 30 % (ref 17.9–39.5)
TIBC: 148 ug/dL — ABNORMAL LOW (ref 250–450)
UIBC: 104 ug/dL

## 2020-10-07 LAB — HEPATITIS B SURFACE ANTIBODY,QUALITATIVE: Hep B S Ab: REACTIVE — AB

## 2020-10-07 LAB — TRIGLYCERIDES: Triglycerides: 123 mg/dL (ref <150)

## 2020-10-07 LAB — FERRITIN: Ferritin: 712 ng/mL — ABNORMAL HIGH (ref 24–336)

## 2020-10-07 MED ORDER — SEVELAMER CARBONATE 800 MG PO TABS
800.0000 mg | ORAL_TABLET | Freq: Three times a day (TID) | ORAL | Status: DC
  Administered 2020-10-07 – 2020-10-17 (×29): 800 mg via ORAL
  Filled 2020-10-07 (×29): qty 1

## 2020-10-07 MED ORDER — SODIUM CHLORIDE 0.9 % IV SOLN: 250.0000 mg | Freq: Two times a day (BID) | INTRAVENOUS | Status: DC

## 2020-10-07 MED ORDER — LEVETIRACETAM 250 MG PO TABS
250.0000 mg | ORAL_TABLET | Freq: Two times a day (BID) | ORAL | Status: DC
  Administered 2020-10-07 – 2020-10-17 (×21): 250 mg via ORAL
  Filled 2020-10-07 (×22): qty 1

## 2020-10-07 NOTE — Progress Notes (Signed)
Nephrology Follow-Up Consult note   Assessment/Recommendations: Christopher Chapman is a/an 65 y.o. 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 Creatinine was already in high 5's range in 03/17/20 and he was counseled that he was nearing ESRD again but missed his f/u appt 2 mths later. He is almost certainly ESRD again now; h/o noncompliance and currently with poor social support thus may not be a candidate for another transplant. -Received dialysis this morning and tolerated well -Plan for dialysis again tomorrow - Starting CLIP process (renal navigator notified) - Dose medications for ESRD and avoid nephrotoxic agents as he still has a little residual renal function. - Continue immunosuppressives for now until everything is sorted out but appears that he may not have the social support for a 2nd transplant. Therefore alloimmunization may be less of a concern.   Renal osteodystrophy -phosphorus 8.8.  PTH pending.  Start sevelamer 800 mg 3 times daily Anemia of CKD: Iron saturation 30 with ferritin of 712.  Hemoglobin 8.9 down from 10.3 yesterday.  Will consider ESA if hemoglobin persistently below 10 HTN - currently on Clevipres gtt but that will be titrated off with oral antihypertensives + UF from HD. SDH - s/p evacuation 9/28. H/o polysubstance abuse   Recommendations conveyed to primary service.    [facility] 10/07/2020 10:44 AM  ___________________________________________________________  CC: ESRD  Interval History/Subjective: Patient states he is feeling better today.  Seems a bit confused but tolerated dialysis this morning without any problems.  Denies  any nausea or vomiting.   Medications:  Current Facility-Administered Medications  Medication Dose Route Frequency Provider Last Rate Last Admin   0.9 %  sodium chloride infusion  100 mL Intravenous PRN Dwana Melena, MD       0.9 %  sodium chloride infusion  100 mL Intravenous PRN Dwana Melena, MD       acetaminophen (TYLENOL) tablet 650 mg  650 mg Oral Q4H PRN [name], MD       Or   acetaminophen (TYLENOL) suppository 650 mg  650 mg Rectal Q4H PRN [name], MD       allopurinol (ZYLOPRIM) tablet 200 mg  200 mg Oral Daily Shearon Stalls, Rahul P, PA-C   200 mg at 10/07/20 9562   alteplase (CATHFLO ACTIVASE) injection 2 mg  2 mg Intracatheter Once PRN Dwana Melena, MD       amLODipine (NORVASC) tablet 5 mg  5 mg Oral Daily Desai, Rahul P, PA-C   5 mg at 10/07/20 0957   Chlorhexidine Gluconate Cloth 2 % PADS 6 each  6 each Topical Daily Spero Geralds, MD   6 each at 10/06/20 1822   clevidipine (CLEVIPREX) infusion 0.5 mg/mL  0-21 mg/hr Intravenous Continuous Desai, Rahul P, PA-C   Stopped at 10/06/20 2048   dexamethasone (DECADRON) injection 4 mg  4 mg Intravenous Q6H [name], MD   4 mg at 10/07/20 1308   Followed by   Derrill Memo ON 10/08/2020] dexamethasone (DECADRON) injection 4 mg  4 mg Intravenous Q8H [name], MD       docusate sodium (COLACE) capsule 100 mg  100 mg Oral BID PRN Shearon Stalls, Rahul P, PA-C  hydrALAZINE (APRESOLINE) injection 10-40 mg  10-40 mg Intravenous Q4H PRN Shearon Stalls, Rahul P, PA-C       HYDROcodone-acetaminophen (NORCO/VICODIN) 5-325 MG per tablet 1 tablet  1 tablet Oral Q4H PRN [name], MD   1 tablet at 10/06/20 2118   insulin aspart (novoLOG) injection 0-15 Units  0-15 Units Subcutaneous TID WC [name], MD   2 Units at 10/07/20 0539   labetalol (NORMODYNE) injection 10-40 mg  10-40 mg Intravenous Q10 min PRN [name], MD       labetalol (NORMODYNE) tablet 300 mg  300 mg Oral TID Shearon Stalls, Rahul P, PA-C   300 mg at 10/07/20 7673    levETIRAcetam (KEPPRA) tablet 250 mg  250 mg Oral BID [name], MD       lidocaine (PF) (XYLOCAINE) 1 % injection 5 mL  5 mL Intradermal PRN Dwana Melena, MD       lidocaine-prilocaine (EMLA) cream 1 application  1 application Topical PRN Dwana Melena, MD       mycophenolate (MYFORTIC) EC tablet 360 mg  360 mg Oral BID Shearon Stalls, Rahul P, PA-C   360 mg at 10/07/20 0957   ondansetron (ZOFRAN) tablet 4 mg  4 mg Oral Q4H PRN [name], MD       Or   ondansetron [facility]) injection 4 mg  4 mg Intravenous Q4H PRN [name], MD   4 mg at 10/06/20 1212   pantoprazole (PROTONIX) injection 40 mg  40 mg Intravenous QHS [name], MD   40 mg at 10/06/20 2100   pentafluoroprop-tetrafluoroeth (GEBAUERS) aerosol 1 application  1 application Topical PRN Dwana Melena, MD       polyethylene glycol (MIRALAX / GLYCOLAX) packet 17 g  17 g Oral Daily PRN Shearon Stalls, Rahul P, PA-C       senna (SENOKOT) tablet 8.6 mg  1 tablet Oral BID [name], MD   8.6 mg at 10/07/20 0954   tacrolimus (PROGRAF) capsule 4 mg  4 mg Oral BID Shearon Stalls, Rahul P, PA-C   4 mg at 10/07/20 4193      Review of Systems: 10 systems reviewed and negative except per interval history/subjective  Physical Exam: Vitals:   10/07/20 0954 10/07/20 1000  BP: (!) 164/76 (!) 155/90  Pulse: 80 81  Resp:    Temp:    SpO2:     Total I/O In: -  Out: 600 [Urine:100; Other:500]  Intake/Output Summary (Last 24 hours) at 10/07/2020 1044 Last data filed at 10/07/2020 0800 Gross per 24 hour  Intake 343.34 ml  Output 1576 ml  Net -1232.66 ml   Constitutional: Lying in bed, no acute distress ENMT: ears and nose without scars or lesions, MMM CV: normal rate, 1+ edema in the bilateral lower extremities Respiratory: clear to auscultation, normal work of breathing Gastrointestinal: soft, non-tender, no palpable masses or hernias Skin: no visible lesions or rashes Psych: alert, judgement/insight seems diminished, appropriate mood and  affect   Test Results I personally reviewed new and old clinical labs and radiology tests Lab Results  Component Value Date   NA 134 (L) 10/07/2020   K 5.6 (H) 10/07/2020   CL 107 10/07/2020   CO2 12 (L) 10/07/2020   BUN 111 (H) 10/07/2020   CREATININE 17.18 (H) 10/07/2020   CALCIUM 7.1 (L) 10/07/2020   ALBUMIN 2.6 (L) 10/07/2020   PHOS 8.8 (H) 10/06/2020

## 2020-10-07 NOTE — Progress Notes (Signed)
? ?  Inpatient Rehab Admissions Coordinator : ? ?Per therapy recommendations, patient was screened for CIR candidacy by Quayshaun Hubbert RN MSN.  At this time patient appears to be a potential candidate for CIR. I will place a rehab consult per protocol for full assessment. Please call me with any questions. ? ?Carols Clemence RN MSN ?Admissions Coordinator ?[phone] ?  ?

## 2020-10-07 NOTE — Progress Notes (Signed)
Assessed patient's left upper arm AVF at 04:24 AM with overall pre-assessment, however, unable to view in flow sheet at the time. No issues.

## 2020-10-07 NOTE — Progress Notes (Signed)
Occupational Therapy Treatment Patient Details Name: Christopher Chapman MRN: [id number] DOB: 1955/12/28 Today's Date: 10/07/2020   History of present illness 65 y.o. male admitted after being found down for unknown amount of time.  He was found to have large R sided chronic SDH with 1/7 cm midline shift and mass effect.   He underwent craniotomy for evacuation. Pt to start HD.   PMH includes: Chronic renal failure, ESRD, gout, h/o cocaine abuse, HTN   OT comments  Pt calling RN by name when he enters room initially. At the end of session states "whose my nurse" pt educated that his nurse was in prior [place]. Pt states he has two names " Christopher Chapman" then states "[name] in [place] and then states "some people say "Jesus" . Pt states "you seen I know a lot of trivial pursuit stuff." Recommendation for CIR if family (A) available. Pt reports having prior situation of being down and found due to "the people I hang with". If no family able to give (A) requiring physical (A) for all adls then will need skilled level care.     Recommendations for follow up therapy are one component of a multi-disciplinary discharge planning process, led by the attending physician.  Recommendations may be updated based on patient status, additional functional criteria and insurance authorization.    Follow Up Recommendations  CIR    Equipment Recommendations  3 in 1 bedside commode    Recommendations for Other Services Rehab consult    Precautions / Restrictions Precautions Precautions: Fall Precaution Comments: fall risk, IV lines, Restrictions Weight Bearing Restrictions: No       Mobility Bed Mobility Overal bed mobility: Needs Assistance Bed Mobility: Rolling;Supine to Sit;Sit to Supine Rolling: Mod assist   Supine to sit: +2 for physical assistance;Max assist Sit to supine: +2 for physical assistance;Max assist   General bed mobility comments: pt requires max cues for sequenc and to elevate trunk. pt with  strong posterior lean and lack of awareness to correct. pt requires max cues to descend to R elbow and total (A) to lift bil LE onto bed surface.    Transfers Overall transfer level: Needs assistance Equipment used: Rolling walker (2 wheeled);2 person hand held assist Transfers: Sit to/from Stand Sit to Stand: +2 physical assistance;Mod assist         General transfer comment: pt sit<>Stand with RW but removed RW to attempt to side step to [facility]. pt unable to advance L LE. pt requires total+2 max (A) to weight shift for therapist to bring LLE into position    Balance Overall balance assessment: Needs assistance Sitting-balance support: No upper extremity supported;Feet supported Sitting balance-Leahy Scale: Zero     Standing balance support: Bilateral upper extremity supported;During functional activity Standing balance-Leahy Scale: Poor                             ADL either performed or assessed with clinical judgement   ADL Overall ADL's : Needs assistance/impaired Eating/Feeding: Minimal assistance;Bed level Eating/Feeding Details (indicate cue type and reason): spillage noted and bacon in bed on arrival. pt unaware of time of day but had eaten breakfast prior to arrival     Upper Body Bathing: Moderate assistance;Bed level   Lower Body Bathing: Maximal assistance;Bed level   Upper Body Dressing : Maximal assistance;Bed level   Lower Body Dressing: Total assistance;Bed level  General ADL Comments: pt very impulsive and decreased motor planning for transfers. recommend bed level bathing at this time until further progression with OT staff     Vision   Additional Comments: difficult to fully assess but did read a watch and name tag during session   Perception     Praxis      Cognition Arousal/Alertness: Awake/alert Behavior During Therapy: Impulsive;WFL for tasks assessed/performed Overall Cognitive Status: Impaired/Different from  baseline Area of Impairment: Orientation;Attention;Memory;Following commands;Awareness;Safety/judgement;Problem solving                 Orientation Level: Disoriented to;Time;Situation;Place (reports i thought it was night. i have been waiting to call because i didnt want to wake them up) Current Attention Level: Sustained Memory: Decreased recall of precautions;Decreased short-term memory Following Commands: Follows one step commands inconsistently;Follows one step commands with increased time Safety/Judgement: Decreased awareness of safety;Decreased awareness of deficits Awareness: Intellectual Problem Solving: Slow processing;Decreased initiation;Difficulty sequencing General Comments: pt very tangential and off topic at times. pt educated on time, location and reason for admission. pt able to recall information after 5 minutes. pt unable to apply education on deficits to functional task. pt LOB without any righting balance strategies. pt lacks awareness to L side of body weakness        Exercises     Shoulder Instructions       General Comments VSS    Pertinent Vitals/ Pain       Pain Assessment: Faces Faces Pain Scale: Hurts a little bit Pain Location: head Pain Descriptors / Indicators: Grimacing Pain Intervention(s): Monitored during session;Premedicated before session;Repositioned  Home Living                                          Prior Functioning/Environment              Frequency  Min 2X/week        Progress Toward Goals  OT Goals(current goals can now be found in the care plan section)  Progress towards OT goals: Progressing toward goals  Acute Rehab OT Goals Patient Stated Goal: to get a Chapman OT Goal Formulation: With patient Time For Goal Achievement: 10/21/20 Potential to Achieve Goals: Good ADL Goals Pt Will Perform Grooming: with min assist;standing Pt Will Perform Upper Body Bathing: with set-up;with  supervision;sitting Pt Will Perform Lower Body Bathing: with min assist;sit to/from stand Pt Will Perform Upper Body Dressing: with set-up;with supervision;sitting Pt Will Perform Lower Body Dressing: with min assist;sit to/from stand Pt Will Transfer to Toilet: with min assist;ambulating;regular height toilet;bedside commode;grab bars Pt Will Perform Toileting - Clothing Manipulation and hygiene: with min assist;sit to/from stand Additional ADL Goal #1: Pt will sustain attention to familiar ADL task x 4 mins with min cues Additional ADL Goal #2: Pt will be oriented x 4 with use of external cues and min verbal cues  Plan Discharge plan remains appropriate    Co-evaluation    PT/OT/SLP Co-Evaluation/Treatment: Yes Reason for Co-Treatment: Necessary to address cognition/behavior during functional activity;Complexity of the patient's impairments (multi-system involvement);For patient/therapist safety;To address functional/ADL transfers   OT goals addressed during session: ADL's and self-care;Proper use of Adaptive equipment and DME;Strengthening/ROM      AM-PAC OT "6 Clicks" Daily Activity     Outcome Measure   Help from another person eating meals?: A Little Help from another person taking care of personal grooming?:  A Lot Help from another person toileting, which includes using toliet, bedpan, or urinal?: A Lot Help from another person bathing (including washing, rinsing, drying)?: A Lot Help from another person to put on and taking off regular upper body clothing?: A Lot Help from another person to put on and taking off regular lower body clothing?: Total 6 Click Score: 12    End of Session Equipment Utilized During Treatment: Gait belt  OT Visit Diagnosis: Cognitive communication deficit (R41.841)   Activity Tolerance Patient tolerated treatment well   Patient Left in bed;with call bell/phone within reach;with bed alarm set;with SCD's reapplied   Nurse Communication Mobility  status;Precautions        Time: 1052 (1052)-1116 OT Time Calculation (min): 24 min  Charges: OT General Charges $OT Visit: 1 Visit OT Treatments $Self Care/Home Management : 8-22 mins   Brynn, OTR/L  Acute Rehabilitation Services Pager: [phone] Office: [phone] .   Jeri Modena 10/07/2020, 1:15 PM

## 2020-10-07 NOTE — Progress Notes (Signed)
Occupational Therapy Evaluation (late entry)  Pt presents to OT with the below listed diagnosis.  He presents to OT with significant cognitive deficits - consfused, and confabulatory.  He is able to complete UB ADLs with min - mod A, and LB ADLs with max - total A.  OOB activity deferred this date as pt unsafe to attempt transfers with +1 assist due to impulsivity and poor safety awareness.    PTA, he lives with his 63 y.o. mother, who has cognitive impairment, and his brother.  Pt reports he was fully independent.  Recommend CIR level rehab.     10/06/20 1700  OT Visit Information  Last OT Received On 10/06/20  Assistance Needed +2  History of Present Illness This 65 y.o. male admitted after being found down for unknown amount of time.  He was found to have large rt sided chronic SDH with 1/7 cm midline shift and mass effect.   He underwent craniotomy for evacuation. Pt to start HD.   PMH includes: Chronic renal failure, ESRD, gout, h/o cocaine abuse, HTN  Precautions  Precautions Fall  Precaution Comments poor insight  Home Living  Family/patient expects to be discharged to: Private residence  Living Arrangements Parent;Other relatives  Available Help at Discharge Available PRN/intermittently  Type of [place] Multi-level  Additional Comments Pt reports he lives in a split level home with his 46 y.o. mother, whom he provides some care for (cognitive deficits), and his retired brother.  He was unable to provide further info re: home set up  Prior Function  Level of Independence Independent  Comments Pt denies h/o falls.  he does not use AD for ambulation.  He reports he drives, and is a retired Manufacturing systems engineer of all trades, Printmaker No difficulties  Pain Assessment  Pain Assessment 0-10  Pain Score 7  Pain Location head  Pain Descriptors / Indicators Operative site guarding  Pain Intervention(s) Premedicated before session;Limited activity  within patient's tolerance  Cognition  Arousal/Alertness Awake/alert  Behavior During Therapy Impulsive;WFL for tasks assessed/performed  Overall Cognitive Status Impaired/Different from baseline  Area of Impairment Orientation;Attention;Memory;Following commands;Awareness;Safety/judgement;Problem solving  Orientation Level Disoriented to;Time;Situation  Current Attention Level Focused;Sustained  Memory Decreased short-term memory;Decreased recall of precautions  Following Commands Follows one step commands consistently;Follows one step commands with increased time  Safety/Judgement Decreased awareness of deficits;Decreased awareness of safety  Awareness Intellectual  Problem Solving Slow processing;Difficulty sequencing;Requires verbal cues;Requires tactile cues  General Comments Pt thinks it's January.  Is unsure why he is hospital.  He was noted to be confabulatory, easily distracted.  thought he could walk into bathroom to "clean up"  Upper Extremity Assessment  Upper Extremity Assessment Generalized weakness (difficult to fully assess due to distractability)  Lower Extremity Assessment  Lower Extremity Assessment Defer to PT evaluation  ADL  Overall ADL's  Needs assistance/impaired  Eating/Feeding Minimal assistance;Bed level  Eating/Feeding Details (indicate cue type and reason) frequent spillage due to poor attention  Grooming Wash/dry hands;Wash/dry face;Oral care;Moderate assistance;Bed level  Grooming Details (indicate cue type and reason) assist for thoroughness  Upper Body Bathing Moderate assistance;Bed level  Lower Body Bathing Maximal assistance;Bed level  Upper Body Dressing  Maximal assistance;Bed level  Lower Body Dressing Total assistance;Bed level  Toilet Transfer Total assistance  Toilet Transfer Details (indicate cue type and reason) unable to safely attempt with +1 assist due to pt impulsivity  Toileting- Clothing Manipulation and Hygiene Total assistance;Bed  level  General  ADL Comments pt requires assist due to impaired cognition  Vision- History  Patient Visual Report No change from baseline  Vision- Assessment  Additional Comments EOMs full.  Pt unable to participate in formal assessment due to poor attention.  He was able to read clock on wall accurately  Perception  Perception Tested? Yes  Praxis  Praxis tested? [facility]  Transfers  General transfer comment deferred this date due to increased pain and pt safety risk with +1 assist  OT - End of Session  Activity Tolerance Patient limited by pain  Patient left in bed;with call bell/phone within reach;with bed alarm set  Nurse Communication Mobility status  OT Assessment  OT Recommendation/Assessment Patient needs continued OT Services  OT Visit Diagnosis Cognitive communication deficit (R41.841)  OT Problem List Decreased strength;Decreased activity tolerance;Impaired balance (sitting and/or standing);Decreased cognition;Decreased safety awareness;Decreased knowledge of use of DME or AE;Decreased knowledge of precautions;Pain  Barriers to Discharge Decreased caregiver support  Barriers to Discharge Comments pt caregiver for his mother - unsure of other family supports  OT Plan  OT Frequency (ACUTE ONLY) Min 2X/week  OT Treatment/Interventions (ACUTE ONLY) Self-care/ADL training;DME and/or AE instruction;Therapeutic activities;Cognitive remediation/compensation;Patient/family education;Balance training  AM-PAC OT "6 Clicks" Daily Activity Outcome Measure (Version 2)  Help from another person eating meals? 3  Help from another person taking care of personal grooming? 2  Help from another person toileting, which includes using toliet, bedpan, or urinal? 2  Help from another person bathing (including washing, rinsing, drying)? 2  Help from another person to put on and taking off regular upper body clothing? 2  Help from another person to put on and taking off regular lower body clothing? 1  6  Click Score 12  Progressive Mobility  What is the highest level of mobility based on the progressive mobility assessment? Level 1 (Bedfast) - Unable to balance while sitting on edge of bed  Mobility Sit up in bed/chair position for meals  OT Recommendation  Recommendations for Other Services Rehab consult  Follow Up Recommendations CIR  OT Equipment 3 in 1 bedside commode  Individuals Consulted  Consulted and Agree with Results and Recommendations Patient  Acute Rehab OT Goals  Patient Stated Goal for my head to stop hurting  OT Goal Formulation With patient  Time For Goal Achievement 10/21/20  Potential to Achieve Goals Good  OT Time Calculation  OT Start Time (ACUTE ONLY) 1632  OT Stop Time (ACUTE ONLY) 1652  OT Time Calculation (min) 20 min  OT General Charges  $OT Visit 1 Visit  OT Evaluation  $OT Eval Moderate Complexity 1 Mod  Written Expression  Dominant Hand Right  Nilsa Nutting., OTR/L Acute Rehabilitation Services Pager [phone] Office [phone]

## 2020-10-07 NOTE — Progress Notes (Signed)
Subjective: Patient reports no headache or NTW  Objective: Vital signs in last 24 hours: Temp:  [98.9 F (37.2 C)-99.2 F (37.3 C)] 99.2 F (37.3 C) (09/30 0725) Pulse Rate:  [69-99] 73 (09/30 0800) Resp:  [14-29] 15 (09/30 0800) BP: (123-171)/(46-101) 158/73 (09/30 0800) SpO2:  [88 %-99 %] 99 % (09/30 0800) Weight:  [98.5 kg-99 kg] 98.5 kg (09/30 0725)  Intake/Output from previous day: 09/29 0701 - 09/30 0700 In: 795.2 [P.O.:100; I.V.:395.2; IV Piggyback:300] Out: 980 [Urine:975; Drains:5] Intake/Output this shift: Total I/O In: -  Out: 600 [Urine:100; Other:500]  Neurologic: Grossly normal, more conversant and less disoriented, MAEx4, incision CDI  Lab Results: Lab Results  Component Value Date   WBC 15.5 (H) 10/07/2020   HGB 8.9 (L) 10/07/2020   HCT 27.9 (L) 10/07/2020   MCV 86.1 10/07/2020   PLT 188 10/07/2020   Lab Results  Component Value Date   INR 1.1 10/05/2020   BMET Lab Results  Component Value Date   NA 134 (L) 10/07/2020   K 5.6 (H) 10/07/2020   CL 107 10/07/2020   CO2 12 (L) 10/07/2020   GLUCOSE 133 (H) 10/07/2020   BUN 111 (H) 10/07/2020   CREATININE 17.18 (H) 10/07/2020   CALCIUM 7.1 (L) 10/07/2020    Studies/Results: CT HEAD WO CONTRAST  Result Date: 10/06/2020 CLINICAL DATA:  Follow-up subdural hemorrhage EXAM: CT HEAD WITHOUT CONTRAST TECHNIQUE: Contiguous axial images were obtained from the base of the skull through the vertex without intravenous contrast. COMPARISON:  Head CT 10/05/2020 FINDINGS: Brain: There has been interval right parietal craniotomy for evacuation of a subdural hematoma with a drainage catheter left in place terminating in the right parietal subdural space. The mixed density right subdural hematoma has decreased in size from 2.6 cm on the prior study to up to 1.1 cm on the current study. Hyperdense blood within the collection may reflect redistribution of acute blood products or new blood. There is postoperative  pneumocephalus within the collection as well as postoperative soft tissue gas and swelling in the scalp. There is significantly decreased mass effect on the underlying brain with decreased effacement of the right lateral ventricle and improved leftward midline shift, currently measuring up to 6 mm at the level of the foramen of Monro, previously measured up to 1.3 cm at a similar level. There is crowding of the right ambient cistern, also improved. The remaining basal cisterns are patent. There is hypodensity in the left occipital lobe not seen on the prior study. Vascular: No hyperdense vessel or unexpected calcification. Skull: As above, there has been interval right parietal craniotomy. There is a remote fracture of the left lamina papyracea. Sinuses/Orbits: Stable. Other: None. IMPRESSION: 1. Interval right parietal craniotomy for drainage of a right holoconvexity subdural hematoma with a drainage catheter left in place terminating over the right parietal lobe. The hematoma has significantly decreased in size, now measuring up to 1.1 cm in maximal thickness, with decreased leftward midline shift now measuring 6 mm. 2. Hypodensity in the left occipital lobe which may be artifactual. Attention on follow-up head CTs. Electronically Signed   By: Valetta Mole M.D.   On: 10/06/2020 08:31   CT HEAD WO CONTRAST (5MM)  Result Date: 10/05/2020 CLINICAL DATA:  [place], found down, left arm and left leg weakness EXAM: CT HEAD WITHOUT CONTRAST TECHNIQUE: Contiguous axial images were obtained from the base of the skull through the vertex without intravenous contrast. COMPARISON:  07/06/2003 FINDINGS: Brain: There is a subacute right-sided subdural hematoma  measuring up to 2.6 cm in maximal thickness. The majority of the subdural collection is decreased in attenuation, with scattered dependent higher attenuation more acute blood products identified posteriorly. There is significant mass effect and leftward midline shift,  measuring approximately 1.7 cm at the level of the septum pellucidum. No evidence of acute infarct. Effacement of the lateral ventricles due to mass effect. Remaining midline structures are unremarkable. Vascular: No hyperdense vessel or unexpected calcification. Skull: Normal. Negative for fracture or focal lesion. Sinuses/Orbits: No acute finding. Invagination of the left lamina papyracea consistent with chronic injury. Other: None. IMPRESSION: 1. Subacute right-sided subdural hematoma with significant mass effect and leftward midline shift measuring 1.7 cm. 2. No acute infarct. Critical Value/emergent results were called by telephone at the time of interpretation on 10/05/2020 at 5:55 pm to provider JOSEPH ZAMMIT, who verbally acknowledged these results. Electronically Signed   By: Randa Ngo M.D.   On: 10/05/2020 17:59   CT Cervical Spine Wo Contrast  Result Date: 10/05/2020 CLINICAL DATA:  Found down, left-sided weakness EXAM: CT CERVICAL SPINE WITHOUT CONTRAST TECHNIQUE: Multidetector CT imaging of the cervical spine was performed without intravenous contrast. Multiplanar CT image reconstructions were also generated. COMPARISON:  None. FINDINGS: Alignment: Alignment is anatomic. Skull base and vertebrae: No acute fracture. No primary bone lesion or focal pathologic process. Soft tissues and spinal canal: No prevertebral fluid or swelling. No visible canal hematoma. Prominent atherosclerosis at the carotid bifurcations. Disc levels:  Mild spondylosis at C5-6 and C6-7. Upper chest: Central airway is patent.  Lung apices are clear. Other: Reconstructed images demonstrate no additional findings. IMPRESSION: 1. No acute cervical spine fracture. Electronically Signed   By: Randa Ngo M.D.   On: 10/05/2020 17:58   DG Chest Portable 1 View  Result Date: 10/05/2020 CLINICAL DATA:  New left arm and leg weakness, coarse breath sounds EXAM: PORTABLE CHEST 1 VIEW COMPARISON:  Chest radiograph 06/15/2015  FINDINGS: The heart is borderline enlarged. The mediastinal contours are within normal limits. The lungs are well inflated. There is no focal consolidation or pulmonary edema. There is no pleural effusion or pneumothorax. There is no acute osseous abnormality. IMPRESSION: Borderline cardiomegaly. Otherwise, no radiographic evidence of acute cardiopulmonary process. Electronically Signed   By: Valetta Mole M.D.   On: 10/05/2020 16:43   DG Cerv Spine Flex&Ext Only  Result Date: 10/06/2020 CLINICAL DATA:  Injury EXAM: CERVICAL SPINE - FLEXION AND EXTENSION VIEWS ONLY COMPARISON:  None. FINDINGS: C1-C6 are well visualized on lateral view. Vertebral body heights are well-maintained. Disc spaces are well-maintained. No evidence of instability on flexion-extension views. Soft tissues are unremarkable. IMPRESSION: No evidence of instability on flexion and extension views. Electronically Signed   By: Yetta Glassman M.D.   On: 10/06/2020 13:22    Assessment/Plan: Doing well, ready for stepdown from our standpoint, mobilize  Estimated body mass index is 28.65 kg/m as calculated from the following:   Height as of this encounter: 6\' 1"  (1.854 m).   Weight as of this encounter: 98.5 kg.    LOS: 2 days    [name] 10/07/2020, 9:41 AM

## 2020-10-07 NOTE — Evaluation (Signed)
Physical Therapy Evaluation Patient Details Name: Christopher Chapman MRN: [id number] DOB: May 26, 1955 Today's Date: 10/07/2020  History of Present Illness  65 y.o. male admitted after being found down for unknown amount of time.  He was found to have large R sided chronic SDH with 1/7 cm midline shift and mass effect.   He underwent craniotomy for evacuation. Pt to start HD.   PMH includes: Chronic renal failure, ESRD, gout, h/o cocaine abuse, HTN  Clinical Impression  Patient is poor historian due to confusion. Patient oriented to self and place this session. Patient presents with L inattention, L sided weakness, decreased activity tolerance, impaired balance, impaired cognition, and impaired functional mobility. Patient requires maxA+2 for bed mobility and side steps at EOB with no active movement of L LE with stepping. Patient with poor awareness of deficits. Patient will benefit from skilled PT services during acute stay to address listed deficits. Recommend CIR following discharge to maximize functional mobility and safety if family able to provide necessary assistance.      Recommendations for follow up therapy are one component of a multi-disciplinary discharge planning process, led by the attending physician.  Recommendations may be updated based on patient status, additional functional criteria and insurance authorization.  Follow Up Recommendations CIR    Equipment Recommendations  Other (comment) (TBD)    Recommendations for Other Services Rehab consult     Precautions / Restrictions Precautions Precautions: Fall Precaution Comments: fall risk, IV lines, Restrictions Weight Bearing Restrictions: No      Mobility  Bed Mobility Overal bed mobility: Needs Assistance Bed Mobility: Rolling;Supine to Sit;Sit to Supine Rolling: Mod assist   Supine to sit: Max assist;+2 for physical assistance Sit to supine: Max assist;+2 for physical assistance   General bed mobility comments:  pt requires max cues for sequenc and to elevate trunk. pt with strong posterior lean and lack of awareness to correct. pt requires max cues to descend to R elbow and total (A) to lift bil LE onto bed surface.    Transfers Overall transfer level: Needs assistance Equipment used: Rolling Wilmore Holsomback (2 wheeled);2 person hand held assist Transfers: Sit to/from Stand Sit to Stand: Mod assist;+2 physical assistance         General transfer comment: pt sit<>Stand with RW but removed RW to attempt to side step to [facility]. pt unable to advance L LE. pt requires total+2 max (A) to weight shift for therapist to bring LLE into position  Ambulation/Gait Ambulation/Gait assistance: Max assist;+2 physical assistance;+2 safety/equipment Gait Distance (Feet): 1 Feet Assistive device: Rolling Ciearra Rufo (2 wheeled);2 person hand held assist Gait Pattern/deviations: Step-to pattern;Decreased weight shift to right;Decreased weight shift to left;Wide base of support Gait velocity: decreased   General Gait Details: requires assist to advance L LE to sidestep at EOB. patient with no active movement of L LE during mobility. Assist to weight shift L/R to advance feet.  Stairs            Wheelchair Mobility    Modified Rankin (Stroke Patients Only) Modified Rankin (Stroke Patients Only) Pre-Morbid Rankin Score: No significant disability Modified Rankin: Severe disability     Balance Overall balance assessment: Needs assistance Sitting-balance support: No upper extremity supported;Feet supported Sitting balance-Leahy Scale: Zero     Standing balance support: Bilateral upper extremity supported;During functional activity Standing balance-Leahy Scale: Poor  Pertinent Vitals/Pain Pain Assessment: Faces Faces Pain Scale: Hurts a little bit Pain Location: head Pain Descriptors / Indicators: Grimacing Pain Intervention(s): Monitored during session;Premedicated  before session;Repositioned    Home Living Family/patient expects to be discharged to:: Private residence Living Arrangements: Parent;Other relatives Available Help at Discharge: Available PRN/intermittently Type of Home: House       Home Layout: Multi-level        Prior Function Level of Independence: Independent         Comments: Pt denies h/o falls.  he does not use AD for ambulation.  He reports he drives, and is a retired Manufacturing systems engineer of all trades, Restaurant manager, fast food of none"     Journalist, newspaper        Extremity/Trunk Assessment   Upper Extremity Assessment Upper Extremity Assessment: Defer to OT evaluation LUE Deficits / Details: tremor/ jerking motion noted even at rest. pt able to complete 2 digits open close fist and grasp a cylindrical position. Pt requires visual input to complete LUE Sensation: decreased proprioception LUE Coordination: decreased fine motor    Lower Extremity Assessment Lower Extremity Assessment: LLE deficits/detail LLE Deficits / Details: L LE grossly 2-/5 LLE Sensation: decreased light touch;decreased proprioception LLE Coordination: decreased fine motor;decreased gross motor       Communication   Communication: No difficulties  Cognition Arousal/Alertness: Awake/alert Behavior During Therapy: Impulsive;WFL for tasks assessed/performed Overall Cognitive Status: Impaired/Different from baseline Area of Impairment: Orientation;Attention;Memory;Following commands;Awareness;Safety/judgement;Problem solving                 Orientation Level: Disoriented to;Place;Time;Situation (reports i thought it was night. i have been waiting to call because i didnt want to wake them up) Current Attention Level: Sustained Memory: Decreased recall of precautions;Decreased short-term memory Following Commands: Follows one step commands inconsistently;Follows one step commands with increased time Safety/Judgement: Decreased awareness of safety;Decreased awareness  of deficits Awareness: Intellectual Problem Solving: Slow processing;Decreased initiation;Difficulty sequencing General Comments: pt very tangential and off topic at times. pt educated on time, location and reason for admission. pt able to recall information after 5 minutes. pt unable to apply education on deficits to functional task. pt LOB without any righting balance strategies. pt lacks awareness to L side of body weakness      General Comments General comments (skin integrity, edema, etc.): VSS    Exercises     Assessment/Plan    PT Assessment Patient needs continued PT services  PT Problem List Decreased strength;Decreased activity tolerance;Decreased balance;Decreased mobility;Decreased coordination;Decreased cognition;Decreased safety awareness;Decreased knowledge of use of DME;Decreased knowledge of precautions;Impaired sensation       PT Treatment Interventions DME instruction;Gait training;Stair training;Therapeutic activities;Therapeutic exercise;Functional mobility training;Balance training;Neuromuscular re-education;Patient/family education;Wheelchair mobility training    PT Goals (Current goals can be found in the Care Plan section)  Acute Rehab PT Goals Patient Stated Goal: to get a bath PT Goal Formulation: Patient unable to participate in goal setting Time For Goal Achievement: 10/21/20 Potential to Achieve Goals: Fair    Frequency Min 4X/week   Barriers to discharge        Co-evaluation PT/OT/SLP Co-Evaluation/Treatment: Yes Reason for Co-Treatment: Necessary to address cognition/behavior during functional activity;For patient/therapist safety;To address functional/ADL transfers PT goals addressed during session: Mobility/safety with mobility;Balance;Strengthening/ROM OT goals addressed during session: ADL's and self-care;Proper use of Adaptive equipment and DME;Strengthening/ROM       AM-PAC PT "6 Clicks" Mobility  Outcome Measure Help needed turning  from your back to your side while in a flat bed without using bedrails?: A Lot  Help needed moving from lying on your back to sitting on the side of a flat bed without using bedrails?: Total Help needed moving to and from a bed to a chair (including a wheelchair)?: Total Help needed standing up from a chair using your arms (e.g., wheelchair or bedside chair)?: Total Help needed to walk in hospital room?: Total Help needed climbing 3-5 steps with a railing? : Total 6 Click Score: 7    End of Session Equipment Utilized During Treatment: Gait belt Activity Tolerance: Patient tolerated treatment well Patient left: in bed;with call bell/phone within reach;with bed alarm set Nurse Communication: Mobility status PT Visit Diagnosis: Unsteadiness on feet (R26.81);Muscle weakness (generalized) (M62.81);Other abnormalities of gait and mobility (R26.89);Difficulty in walking, not elsewhere classified (R26.2);Other symptoms and signs involving the nervous system (R29.898);Hemiplegia and hemiparesis Hemiplegia - Right/Left: Left Hemiplegia - dominant/non-dominant: Non-dominant Hemiplegia - caused by:  (SDH)    Time: 5035-4656 PT Time Calculation (min) (ACUTE ONLY): 23 min   Charges:   PT Evaluation $PT Eval Moderate Complexity: 1 Mod          Glendon Fiser A. Gilford Rile PT, DPT Acute Rehabilitation Services Pager [phone] Office [phone]   Linna Hoff 10/07/2020, 2:56 PM

## 2020-10-07 NOTE — Progress Notes (Signed)
Pt is being reviewed for possible placement at [facility] for out-pt HD at d/c. Days/time have not been provided as of yet. Will follow and assist.   Melven Sartorius Renal Navigator [phone]

## 2020-10-07 NOTE — Progress Notes (Addendum)
NAME:  KAHNER YANIK, MRN:  [id number], DOB:  Aug 18, 1955, LOS: 2 ADMISSION DATE:  10/05/2020, CONSULTATION DATE:  10/05/20 REFERRING MD:  Ronnald Ramp CHIEF COMPLAINT:  AMS   History of Present Illness:  SEVILLE DOWNS is a 65 y.o. male who has a PMH as below including but not limited to CKD s/p kidney transplant with notes of worsening renal failure requiring intermittent dialysis (pt unsure of when his last session was but has missed it in "quite a while"), HTN, HLD, DM, polysubstance abuse, remote TB.  He was brought to [facility] ED 9/28 with AMS and after being found down on the floor with unknown downtime, possibly 4 days.  CT head was obtained and demonstrated subacute R SDH with ME and MLS of 1.7cm.  He was evaluated by neurosurgery and was taken to the OR 9/28 for right craniotomy for evacuation.  Post op, PCCM asked to assist with medical management. Post op Hgb noted at 7.1.  1u PRBC ordered and transfusing.  Post op also hypertensive with SBP's 190s.  He was subsequently started on Clevidipine.  Pertinent  Medical History:  has Essential hypertension; HYPERPARATHYROIDISM, SECONDARY; ALCOHOL ABUSE, HX OF; End stage renal disease ([place]); History of gout; Carotid stenosis, bilateral; Routine adult health maintenance; CMV (cytomegalovirus infection) ([place]); Dyslipidemia; Immunosuppression ([place]); Kidney replaced by transplant; History of cocaine use; Type II diabetes mellitus ([place]); Subdural hematoma ([place]); Altered mental status; ABLA (acute blood loss anemia); Stage 4 chronic kidney disease ([place]); and S/P craniotomy on their problem list.  Significant Hospital Events: Including procedures, antibiotic start and stop dates in addition to other pertinent events   9/28 > admit, to OR for craniotomy and evacuation. 9/29 awake. Oriented but impulsive and inattentive. Strength equal (had left sided weakness pre-op) still on cleviprex gtt. Diet ordered and oral antihypertensive started. Treating hyperkalemia.  PT and OT ordered. Nephroi consulted. Got first treatment of iHD drain removed. C-spine cleared, C collar removed. 1/2 BC positive staph epi (contamination) 9/30 second treatment of iHD, off cleviprex. Ready for dc from ICU.   Interim History / Subjective:  No complaints  Objective:  Blood pressure (Abnormal) 155/80, pulse 82, temperature 99.2 F (37.3 C), temperature source Oral, resp. rate 20, height 6\' 1"  (1.854 m), weight 98.5 kg, SpO2 98 %.        Intake/Output Summary (Last 24 hours) at 10/07/2020 0757 Last data filed at 10/07/2020 0725 Gross per 24 hour  Intake 795.2 ml  Output 1480 ml  Net -684.8 ml   Filed Weights   10/06/20 0339 10/07/20 0424 10/07/20 0725  Weight: 99 kg 99 kg 98.5 kg    Examination: General awake. No distress. Very talkative HENT NCAT no JVD drain out. Staples intact Pulm CTA no accessory use  Card rrr  Abd soft  Ext warm dry LUE w/ good bruit and thrill Gu voiding some  Neuro awake. Follow commands. Still impulsive but oriented    Resolved Hospital Problem list:    Assessment & Plan:   Subacute R SDH - s/p right craniotomy and evacuation. Plan Neuro checks q4 Cont post op keppra and decadron   Hx CKD s/p renal transplant  w/ progressive ESRD  His BUN is 100; Cr 16.71. these are stable from admit but his creatinine has tripled over last 3 mo.  Plan  Defer mycophenolate and tacro to renal. If he's going to be iHD dependent not sure this is needed Cont BP control  iHD per renal   Acid base and fluid  and electrolyte imbalance w/ Anion gap metabolic acidosis, hyperchloremia, and hyperkalemia ->slowly improving w/ iHD Plan Nephro following   Hx HTN, HLD. Plan Cont amlodipine and labetolol No on iHD SBP goal < 160; Cleviprex PRN hydralazine Dc cleviprex  No asa  Leukocytosis suspect reactive Plan Trending cbc  Hx DM. Plan Ssi  Acute blood loss anemia - s/p 1u PRBC 9/28. Presumed platelet dyfxn from uremia  Plan Cont to  trend cbc Transfuse for hgb < 7    Hx polysubstance abuse. Plan Needs substance abuse Counciling    Best practice (evaluated daily):  Diet/type: Regular consistency (see orders) DVT prophylaxis: SCD GI prophylaxis: N/A Lines: Arterial Line Foley:  N/A Code Status:  full code Last date of multidisciplinary goals of care discussion: N/A.   Critical care time: NA   Transfer to SDU IM to assume care   Erick Colace ACNP-BC [place] Pager # [phone] OR # [phone] if no answer

## 2020-10-08 DIAGNOSIS — R4182 Altered mental status, unspecified: Secondary | ICD-10-CM | POA: Diagnosis not present

## 2020-10-08 DIAGNOSIS — N184 Chronic kidney disease, stage 4 (severe): Secondary | ICD-10-CM | POA: Diagnosis not present

## 2020-10-08 LAB — BASIC METABOLIC PANEL
Anion gap: 13 (ref 5–15)
BUN: 89 mg/dL — ABNORMAL HIGH (ref 8–23)
CO2: 20 mmol/L — ABNORMAL LOW (ref 22–32)
Calcium: 6.5 mg/dL — ABNORMAL LOW (ref 8.9–10.3)
Chloride: 101 mmol/L (ref 98–111)
Creatinine, Ser: 12.72 mg/dL — ABNORMAL HIGH (ref 0.61–1.24)
GFR, Estimated: 4 mL/min — ABNORMAL LOW (ref >60)
Glucose, Bld: 150 mg/dL — ABNORMAL HIGH (ref 70–99)
Potassium: 4.3 mmol/L (ref 3.5–5.1)
Sodium: 134 mmol/L — ABNORMAL LOW (ref 135–145)

## 2020-10-08 LAB — HEPATITIS B SURFACE ANTIBODY, QUANTITATIVE: Hep B S AB Quant (Post): 113.3 m[IU]/mL (ref >9.9)

## 2020-10-08 LAB — GLUCOSE, CAPILLARY
Glucose-Capillary: 141 mg/dL — ABNORMAL HIGH (ref 70–99)
Glucose-Capillary: 180 mg/dL — ABNORMAL HIGH (ref 70–99)
Glucose-Capillary: 170 mg/dL — ABNORMAL HIGH (ref 70–99)
Glucose-Capillary: 167 mg/dL — ABNORMAL HIGH (ref 70–99)

## 2020-10-08 LAB — CBC
HCT: 26 % — ABNORMAL LOW (ref 39.0–52.0)
Hemoglobin: 8.5 g/dL — ABNORMAL LOW (ref 13.0–17.0)
MCH: 27.9 pg (ref 26.0–34.0)
MCHC: 32.7 g/dL (ref 30.0–36.0)
MCV: 85.2 fL (ref 80.0–100.0)
Platelets: 156 10*3/uL (ref 150–400)
RBC: 3.05 MIL/uL — ABNORMAL LOW (ref 4.22–5.81)
RDW: 16.4 % — ABNORMAL HIGH (ref 11.5–15.5)
WBC: 12.8 10*3/uL — ABNORMAL HIGH (ref 4.0–10.5)
nRBC: 0.7 % — ABNORMAL HIGH (ref 0.0–0.2)

## 2020-10-08 LAB — PARATHYROID HORMONE, INTACT (NO CA): PTH: 440 pg/mL — ABNORMAL HIGH (ref 15–65)

## 2020-10-08 MED ORDER — TACROLIMUS 1 MG PO CAPS
2.0000 mg | ORAL_CAPSULE | Freq: Two times a day (BID) | ORAL | Status: DC
  Administered 2020-10-08 – 2020-10-17 (×19): 2 mg via ORAL
  Filled 2020-10-08 (×20): qty 2

## 2020-10-08 MED ORDER — CALCITRIOL 0.5 MCG PO CAPS
0.5000 ug | ORAL_CAPSULE | ORAL | Status: DC
  Administered 2020-10-10 – 2020-10-17 (×3): 0.5 ug via ORAL
  Filled 2020-10-08: qty 2
  Filled 2020-10-08: qty 1
  Filled 2020-10-08: qty 2

## 2020-10-08 MED ORDER — DARBEPOETIN ALFA 60 MCG/0.3ML IJ SOSY
60.0000 ug | PREFILLED_SYRINGE | INTRAMUSCULAR | Status: DC
  Administered 2020-10-10: 60 ug via INTRAVENOUS
  Filled 2020-10-08: qty 0.3

## 2020-10-08 MED ORDER — DEXAMETHASONE 4 MG PO TABS
4.0000 mg | ORAL_TABLET | Freq: Three times a day (TID) | ORAL | Status: DC
  Administered 2020-10-08 – 2020-10-10 (×8): 4 mg via ORAL
  Filled 2020-10-08 (×8): qty 1

## 2020-10-08 MED ORDER — HYDRALAZINE HCL 20 MG/ML IJ SOLN: 10.0000 mg | INTRAMUSCULAR | Status: DC | PRN

## 2020-10-08 NOTE — Progress Notes (Signed)
Subjective:  O/N Events: Transferred to IMTS  Patient seen at bedside this AM.  He states that he still has a headache, but is gradually improving.  He has no questions or concerns at this time.  Objective:  Vital signs in last 24 hours: Vitals:   10/07/20 2000 10/07/20 2100 10/07/20 2310 10/08/20 0433  BP: (!) 148/76 (!) 165/88 (!) 158/85 (!) 164/89  Pulse: 71 76 74 76  Resp: 16 18 20 20   Temp: 99.2 F (37.3 C)  97.8 F (36.6 C) 97.8 F (36.6 C)  TempSrc: Oral  Oral Oral  SpO2: 98% 98% 100% 100%  Weight:      Height:       Physical Exam Constitutional:      General: He is not in acute distress.    Appearance: Normal appearance. He is not ill-appearing, toxic-appearing or diaphoretic.     Comments: Resting comfortably in bed, answers questions appropriately, no acute distress  HENT:     Head:     Comments: R surgical site shows no erythema, drainage, or purulence. Staples intact.  Cardiovascular:     Rate and Rhythm: Normal rate and regular rhythm.     Pulses: Normal pulses.     Heart sounds: Normal heart sounds. No murmur heard.   No friction rub. No gallop.  Pulmonary:     Effort: Pulmonary effort is normal.     Breath sounds: Normal breath sounds. No wheezing, rhonchi or rales.  Musculoskeletal:     Right lower leg: No edema.     Left lower leg: No edema.     Comments: Strength 5/5 in the RUE and RLE Strength 4/5 in the LUE and LLE   Skin:    General: Skin is warm and dry.  Neurological:     General: No focal deficit present.     Mental Status: He is alert and oriented to person, place, and time.  Psychiatric:        Behavior: Behavior normal.     Assessment/Plan:  Active Problems:   Subdural hematoma   Altered mental status   ABLA (acute blood loss anemia)   Stage 4 chronic kidney disease (HCC)   S/P craniotomy   Pearlean Brownie is a 65 y/o M with a PMHx HTN, ETOH Abuse, cocaine abuse, DM, and ESRD w/kidney transplant, who presented for AMS and  was found to have a SDH and admitted for Craniotomy and evacuation.   Subacute R SDH now S/P R Craniotomy and Evacuation:  Craniotomy and evacuation on 10/05/2020.  Mentating well at bedside, fully conversant, and alert and oriented x3.  Appears to be a candidate for CIR placement. - Continue Frequent neuro check  - Continue keppra 250 mg BID - Continue decadron 4 mg Q8H     Hx CKD S/P Renal Transplant  w/ Progressive ESRD Being followed by Nephrology, receiving iHD, working toward  - Continue mycophenolate and tacro per nephrology - Cont BP control  - HD per nephrology   Acid base and fluid and electrolyte imbalance w/ Anion gap metabolic acidosis, hyperchloremia, and hyperkalemia Improving with HD - Trend BMP  - Appreciate Nephro following    HTN:   - Amlodipine 5 mg daily - Labetolol 300 mg TID - PRN hydralazine    Leukocytosis Suspect Reactive Staph epi on 1/2 plates, likely contaminate, leukocytosis continues to downtrend. - Trend CBC   DM:  - Continue SSI   Acute Blood Loss Anemia Platelet Dysfunction in the Setting of Uremia:  Patient s/p 1 PRBC 9/28  - Trend CBC - Transfuse for Hgb < 7      Hx Polysubstance Abuse:  - Will need substance abuse counseling   Prior to Admission Living Arrangement: Home Anticipated Discharge Location: Appropriate candidate for CIR Barriers to Discharge: Continued Medical Workup Dispo: Anticipated discharge in approximately 4-5 day(s).   Maudie Mercury, MD 10/08/2020, 6:58 AM After 5pm on weekdays and 1pm on weekends: On Call pager [phone]

## 2020-10-08 NOTE — Progress Notes (Addendum)
IP rehab admissions - I met with patient at the bedside.  I obtained permission to call his family.  Patient concerned because he lost his wallet and phone.  He would like to call the bank to let them how about his lost bank card.  I will follow up regarding potential rehab plans after I reach some family member.  Call for questions.  [phone]  No contacts for family are listed in the chart.  I tried [phone] but there was no answer and no voice mail.  Will need to get some family contacts so that we can discuss rehab needs.

## 2020-10-08 NOTE — Progress Notes (Addendum)
Nephrology Follow-Up Consult note   Assessment/Recommendations: Christopher Chapman is a/an 65 y.o. 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 Creatinine was already in high 5's range in 03/17/20 and he was counseled that he was nearing ESRD again but missed his f/u appt 2 mths later. He is almost certainly ESRD again now; h/o noncompliance and currently with poor social support thus may not be a candidate for another transplant. -Plan for dialysis again today and then Monday - Starting CLIP process (renal navigator notified) - Dose medications for ESRD and avoid nephrotoxic agents as he still has a little residual renal function. - Will cut Tac in half to 2mg  BID but overall continue immunosuppressives for now until everything is sorted out but appears that he may not have the social support for a 2nd transplant. Therefore alloimmunization may be less of a concern.   Renal osteodystrophy -phosphorus 8.8. Started sevelamer. PTH 440. Calcium low. 3 Ca bath and start calcitriol 0.5 MWF Anemia of CKD: Iron saturation 30 with ferritin of 712.  Hemoglobin 8.5. Start aranesp 56mcg with dialysis on Monday. HTN - BP now improved. Watch with HD. SDH - s/p evacuation 9/28. H/o polysubstance abuse   Recommendations conveyed to primary service.    [facility] 10/08/2020 10:29 AM  ___________________________________________________________  CC: ESRD  Interval History/Subjective: Patient feels well today.  Denies specific complaints.  Plans for dialysis today.   Medications:  Current Facility-Administered Medications  Medication Dose Route Frequency Provider Last Rate  Last Admin   0.9 %  sodium chloride infusion  100 mL Intravenous PRN Erick Colace, NP       0.9 %  sodium chloride infusion  100 mL Intravenous PRN Erick Colace, NP       acetaminophen (TYLENOL) tablet 650 mg  650 mg Oral Q4H PRN Erick Colace, NP       Or   acetaminophen (TYLENOL) suppository 650 mg  650 mg Rectal Q4H PRN Erick Colace, NP       allopurinol (ZYLOPRIM) tablet 200 mg  200 mg Oral Daily Erick Colace, NP   200 mg at 10/07/20 0958   alteplase (CATHFLO ACTIVASE) injection 2 mg  2 mg Intracatheter Once PRN Erick Colace, NP       amLODipine (NORVASC) tablet 5 mg  5 mg Oral Daily Erick Colace, NP   5 mg at 10/07/20 0957   Chlorhexidine Gluconate Cloth 2 % PADS 6 each  6 each Topical Daily Erick Colace, NP   6 each at 10/07/20 2031   dexamethasone (DECADRON) tablet 4 mg  4 mg Oral Q8H [name], MD   4 mg at 10/08/20 0132   docusate sodium (COLACE) capsule 100 mg  100 mg Oral BID PRN Erick Colace, NP       hydrALAZINE (APRESOLINE) injection 10 mg  10 mg Intravenous Q4H PRN Maudie Mercury, MD       HYDROcodone-acetaminophen (NORCO/VICODIN) 5-325 MG per tablet 1 tablet  1 tablet Oral Q4H PRN Erick Colace, NP   1 tablet at 10/07/20 2030   insulin aspart (novoLOG) injection 0-15 Units  0-15 Units Subcutaneous TID WC Erick Colace, NP  2 Units at 10/08/20 0656   labetalol (NORMODYNE) tablet 300 mg  300 mg Oral TID Erick Colace, NP   300 mg at 10/07/20 2100   levETIRAcetam (KEPPRA) tablet 250 mg  250 mg Oral BID [name], MD   250 mg at 10/07/20 2100   lidocaine (PF) (XYLOCAINE) 1 % injection 5 mL  5 mL Intradermal PRN Erick Colace, NP       lidocaine-prilocaine (EMLA) cream 1 application  1 application Topical PRN Erick Colace, NP       mycophenolate (MYFORTIC) EC tablet 360 mg  360 mg Oral BID Erick Colace, NP   360 mg at 10/07/20 2100   ondansetron (ZOFRAN) tablet 4 mg  4 mg Oral Q4H PRN Erick Colace, NP       Or    ondansetron (ZOFRAN) injection 4 mg  4 mg Intravenous Q4H PRN Erick Colace, NP   4 mg at 10/06/20 1212   pantoprazole (PROTONIX) injection 40 mg  40 mg Intravenous QHS Erick Colace, NP   40 mg at 10/07/20 2100   pentafluoroprop-tetrafluoroeth (GEBAUERS) aerosol 1 application  1 application Topical PRN Erick Colace, NP       senna (SENOKOT) tablet 8.6 mg  1 tablet Oral BID Erick Colace, NP   8.6 mg at 10/07/20 2100   sevelamer carbonate (RENVELA) tablet 800 mg  800 mg Oral TID WC Reesa Chew, MD   800 mg at 10/08/20 0903   tacrolimus (PROGRAF) capsule 4 mg  4 mg Oral BID Erick Colace, NP   4 mg at 10/07/20 2100      Review of Systems: 10 systems reviewed and negative except per interval history/subjective  Physical Exam: Vitals:   10/08/20 0433 10/08/20 0721  BP: (!) 164/89 (!) 149/92  Pulse: 76 77  Resp: 20 17  Temp: 97.8 F (36.6 C) 98.6 F (37 C)  SpO2: 100% 100%   No intake/output data recorded.  Intake/Output Summary (Last 24 hours) at 10/08/2020 1029 Last data filed at 10/07/2020 1638 Gross per 24 hour  Intake 120 ml  Output 250 ml  Net -130 ml   Constitutional: Lying in bed, no acute distress ENMT: ears and nose without scars or lesions, MMM CV: normal rate, 1+ edema in the bilateral lower extremities Respiratory: Bilateral chest rise, normal work of breathing Gastrointestinal: soft, non-tender, no palpable masses or hernias Skin: no visible lesions or rashes Psych: alert, judgement/insight seems diminished, appropriate mood and affect   Test Results I personally reviewed new and old clinical labs and radiology tests Lab Results  Component Value Date   NA 134 (L) 10/08/2020   K 4.3 10/08/2020   CL 101 10/08/2020   CO2 20 (L) 10/08/2020   BUN 89 (H) 10/08/2020   CREATININE 12.72 (H) 10/08/2020   CALCIUM 6.5 (L) 10/08/2020   ALBUMIN 2.6 (L) 10/07/2020   PHOS 8.8 (H) 10/06/2020

## 2020-10-08 NOTE — Progress Notes (Signed)
Occupational Therapy Treatment Patient Details Name: Christopher Chapman MRN: [id number] DOB: 05-05-1955 Today's Date: 10/08/2020   History of present illness 65 y.o. male admitted after being found down for unknown amount of time.  He was found to have large R sided chronic SDH with 1/7 cm midline shift and mass effect.   He underwent craniotomy for evacuation. Pt to start HD.   PMH includes: Chronic renal failure, ESRD, gout, h/o cocaine abuse, HTN   OT comments  Patient with fair progress toward patient focused goals.  Bed mobility improved to Mod A of one, and he was able to perform a stand pivot from the bed to a recliner - R side transfer, with Mod A of one and cues for sequencing.  Patient required setup and min A for basic grooming and to finish eating his breakfast.  OT to continue to follow in the acute setting, but CIR continues to be recommended for post acute rehab.     Recommendations for follow up therapy are one component of a multi-disciplinary discharge planning process, led by the attending physician.  Recommendations may be updated based on patient status, additional functional criteria and insurance authorization.    Follow Up Recommendations  CIR    Equipment Recommendations  3 in 1 bedside commode    Recommendations for Other Services Rehab consult    Precautions / Restrictions Precautions Precautions: Fall Restrictions Weight Bearing Restrictions: No       Mobility Bed Mobility Overal bed mobility: Needs Assistance Bed Mobility: Supine to Sit     Supine to sit: Mod assist       Patient Response: Impulsive  Transfers Overall transfer level: Needs assistance Equipment used: 1 person hand held assist Transfers: Sit to/from Stand;Stand Pivot Transfers Sit to Stand: Mod assist Stand pivot transfers: Mod assist            Balance Overall balance assessment: Needs assistance Sitting-balance support: No upper extremity supported;Feet  supported Sitting balance-Leahy Scale: Fair   Postural control: Posterior lean Standing balance support: Bilateral upper extremity supported Standing balance-Leahy Scale: Poor                             ADL either performed or assessed with clinical judgement   ADL   Eating/Feeding: Set up;Sitting   Grooming: Wash/dry hands;Wash/dry face;Oral care;Minimal assistance;Sitting                                                         Cognition Arousal/Alertness: Awake/alert Behavior During Therapy: Impulsive;WFL for tasks assessed/performed Overall Cognitive Status: Impaired/Different from baseline                     Current Attention Level: Sustained   Following Commands: Follows one step commands inconsistently;Follows one step commands with increased time Safety/Judgement: Decreased awareness of safety;Decreased awareness of deficits Awareness: Intellectual Problem Solving: Slow processing;Decreased initiation;Difficulty sequencing                            Pertinent Vitals/ Pain       Pain Assessment: No/denies pain Pain Intervention(s): Monitored during session  Frequency  Min 2X/week        Progress Toward Goals  OT Goals(current goals can now be found in the care plan section)  Progress towards OT goals: Progressing toward goals  Acute Rehab OT Goals Patient Stated Goal: to get a bath OT Goal Formulation: With patient Time For Goal Achievement: 10/21/20 Potential to Achieve Goals: Good  Plan Discharge plan remains appropriate    Co-evaluation                 AM-PAC OT "6 Clicks" Daily Activity     Outcome Measure   Help from another person eating meals?: A Little Help from another person taking care of personal grooming?: A Little Help from another person toileting, which includes using toliet, bedpan, or  urinal?: A Lot Help from another person bathing (including washing, rinsing, drying)?: A Lot Help from another person to put on and taking off regular upper body clothing?: A Lot Help from another person to put on and taking off regular lower body clothing?: A Lot 6 Click Score: 14    End of Session Equipment Utilized During Treatment: Gait belt  OT Visit Diagnosis: Cognitive communication deficit (R41.841)   Activity Tolerance Patient tolerated treatment well   Patient Left in chair;with call bell/phone within reach;with chair alarm set   Nurse Communication Mobility status        Time: [phone] OT Time Calculation (min): 18 min  Charges: OT General Charges $OT Visit: 1 Visit OT Treatments $Self Care/Home Management : 8-22 mins  10/08/2020  RP, OTR/L  Acute Rehabilitation Services  Office:  [place] 10/08/2020, 10:08 AM

## 2020-10-09 LAB — CBC
HCT: 24.8 % — ABNORMAL LOW (ref 39.0–52.0)
Hemoglobin: 8.1 g/dL — ABNORMAL LOW (ref 13.0–17.0)
MCH: 27.8 pg (ref 26.0–34.0)
MCHC: 32.7 g/dL (ref 30.0–36.0)
MCV: 85.2 fL (ref 80.0–100.0)
Platelets: 136 10*3/uL — ABNORMAL LOW (ref 150–400)
RBC: 2.91 MIL/uL — ABNORMAL LOW (ref 4.22–5.81)
RDW: 15.9 % — ABNORMAL HIGH (ref 11.5–15.5)
WBC: 11 10*3/uL — ABNORMAL HIGH (ref 4.0–10.5)
nRBC: 0.5 % — ABNORMAL HIGH (ref 0.0–0.2)

## 2020-10-09 LAB — GLUCOSE, CAPILLARY
Glucose-Capillary: 174 mg/dL — ABNORMAL HIGH (ref 70–99)
Glucose-Capillary: 137 mg/dL — ABNORMAL HIGH (ref 70–99)
Glucose-Capillary: 162 mg/dL — ABNORMAL HIGH (ref 70–99)
Glucose-Capillary: 155 mg/dL — ABNORMAL HIGH (ref 70–99)
Glucose-Capillary: 141 mg/dL — ABNORMAL HIGH (ref 70–99)

## 2020-10-09 LAB — TYPE AND SCREEN
ABO/RH(D): AB POS
Antibody Screen: NEGATIVE
Unit division: 0
Unit division: 0

## 2020-10-09 LAB — BASIC METABOLIC PANEL
Anion gap: 11 (ref 5–15)
BUN: 53 mg/dL — ABNORMAL HIGH (ref 8–23)
CO2: 24 mmol/L (ref 22–32)
Calcium: 6.9 mg/dL — ABNORMAL LOW (ref 8.9–10.3)
Chloride: 99 mmol/L (ref 98–111)
Creatinine, Ser: 8.11 mg/dL — ABNORMAL HIGH (ref 0.61–1.24)
GFR, Estimated: 7 mL/min — ABNORMAL LOW (ref >60)
Glucose, Bld: 154 mg/dL — ABNORMAL HIGH (ref 70–99)
Potassium: 4 mmol/L (ref 3.5–5.1)
Sodium: 134 mmol/L — ABNORMAL LOW (ref 135–145)

## 2020-10-09 LAB — BPAM RBC
Blood Product Expiration Date: 202210212359
Blood Product Expiration Date: 202210292359
ISSUE DATE / TIME: 202209282030
ISSUE DATE / TIME: 202209282030
Unit Type and Rh: 6200
Unit Type and Rh: 8400

## 2020-10-09 MED ORDER — LOSARTAN POTASSIUM 50 MG PO TABS
25.0000 mg | ORAL_TABLET | Freq: Every day | ORAL | Status: DC
  Administered 2020-10-09 – 2020-10-17 (×9): 25 mg via ORAL
  Filled 2020-10-09 (×9): qty 1

## 2020-10-09 NOTE — Progress Notes (Signed)
Subjective:   O/N Events: None  Patient seen at bedside this AM. Pt endorses some weakness on the left side compared to the right, but he otherwise denies numbness, tingling, clumsiness. He voices understanding of his situation and states that he does not want others to suffer due to "mistakes he has made in the past." He has no questions or concerns at this time.  Objective:  Vital signs in last 24 hours: Vitals:   10/08/20 2004 10/08/20 2323 10/09/20 0356 10/09/20 0727  BP: (!) 154/77 (!) 154/91 (!) 158/83 (!) 154/81  Pulse: 87 81 74 73  Resp:  18 19 18   Temp: 99 F (37.2 C) 98.1 F (36.7 C) 98.1 F (36.7 C) 98.2 F (36.8 C)  TempSrc: Oral Oral  Oral  SpO2: 100% 99% 100% 100%  Weight:      Height:       Physical Exam Constitutional:      General: He is not in acute distress.    Appearance: Normal appearance. He is not ill-appearing, toxic-appearing or diaphoretic.     Comments: Resting comfortably in bed, answers questions appropriately, no acute distress  HENT:     Head:     Comments: R surgical site shows no erythema, drainage, or purulence. Staples intact.  Cardiovascular:     Rate and Rhythm: Normal rate and regular rhythm.     Pulses: Normal pulses.     Heart sounds: Normal heart sounds. No murmur heard.   No friction rub. No gallop.  Pulmonary:     Effort: Pulmonary effort is normal.     Breath sounds: Normal breath sounds. No wheezing, rhonchi or rales.  Musculoskeletal:     Right lower leg: No edema.     Left lower leg: No edema.     Comments: Strength 5/5 in the RUE and RLE Strength 4/5 in the LUE and LLE   Skin:    General: Skin is warm and dry.  Neurological:     General: No focal deficit present.     Mental Status: He is alert and oriented to person, place, and time.  Psychiatric:        Behavior: Behavior normal.     Comments: Talks quickly, somewhat tangential speech     Assessment/Plan:  Active Problems:   Subdural hematoma   Altered  mental status   ABLA (acute blood loss anemia)   Stage 4 chronic kidney disease (HCC)   S/P craniotomy  Christopher Chapman is a 65 y/o M with a PMHx HTN, ETOH Abuse, cocaine abuse, DM, and ESRD w/kidney transplant, who presented for AMS and was found to have a SDH and admitted for Craniotomy and evacuation.   Subacute R SDH now S/P R Craniotomy and Evacuation:  Craniotomy and evacuation on 10/05/2020. Mentating well at bedside, fully conversant, and alert and oriented x3. Appears to be a candidate for CIR placement. - Appreciate neurosurgery recommendations, will reach out tomorrow in regards to steroid regimen - Continue Frequent neuro check  - Continue keppra 250 mg BID - Continue decadron 4 mg Q8H  Hx CKD S/P Renal Transplant  w/ Progressive ESRD Being followed by Nephrology, receiving iHD, CLIP process underway - Continue mycophenolate and tacro per nephrology - Continue BP control  - HD per nephrology   Acid base and fluid and electrolyte imbalance w/ Anion gap metabolic acidosis, hyperchloremia, and hyperkalemia Improving with HD. - Trend BMP  - Appreciate Nephro following    HTN:   - Amlodipine 5 mg  daily - Labetolol 300 mg TID - PRN hydralazine   Leukocytosis, likely reactive Staph epi on 1/2 plates, likely contaminate, leukocytosis continues to downtrend. - Trend CBC   DM:  - Continue SSI   Acute Blood Loss Anemia Platelet Dysfunction in the Setting of Uremia:  Patient s/p 1 PRBC 9/28  - Trend CBC - Transfuse for Hgb < 7    Hx Polysubstance Abuse:  - Will need substance abuse counseling   Prior to Admission Living Arrangement: Home Anticipated Discharge Location: Appropriate candidate for CIR Barriers to Discharge: Continued Medical Workup Dispo: Anticipated discharge in approximately 3-4 day(s).   Orvis Brill, MD 10/09/2020, 11:10 AM After 5pm on weekdays and 1pm on weekends: On Call pager [phone]

## 2020-10-09 NOTE — Progress Notes (Signed)
Nephrology Follow-Up Consult note   Assessment/Recommendations: Christopher Chapman is a/an 65 y.o. 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 Creatinine was already in high 5's range in 03/17/20 and he was counseled that he was nearing ESRD again but missed his f/u appt 2 mths later. He is almost certainly ESRD again now; h/o noncompliance and currently with poor social support thus may not be a candidate for another transplant. -Plan for dialysis tomorrow and likely maintain MWF schedule - Starting CLIP process (renal navigator notified) - Dose medications for ESRD and avoid nephrotoxic agents as he still has a little residual renal function. - Cut Tac in half to 2mg  BID but overall continue immunosuppressives for now until everything is sorted out but appears that he may not have the social support for a 2nd transplant. Therefore alloimmunization may be less of a concern. -Possibly going to rehab   Renal osteodystrophy -phosphorus 8.8. Started sevelamer. PTH 440. Calcium low. 3 Ca bath and start calcitriol 0.5 MWF Anemia of CKD: Iron saturation 30 with ferritin of 712.  Hemoglobin 8.5. Start aranesp 74mcg with dialysis on Monday. HTN - BP now improved. Watch with HD. SDH - s/p evacuation 9/28. H/o polysubstance abuse   Recommendations conveyed to primary service.    [facility] 10/09/2020 8:50 AM  ___________________________________________________________  CC: ESRD  Interval History/Subjective: Patient continues to feel well without any complaints.  Tolerated dialysis yesterday.   Medications:  Current Facility-Administered Medications  Medication Dose  Route Frequency Provider Last Rate Last Admin   0.9 %  sodium chloride infusion  100 mL Intravenous PRN Erick Colace, NP       0.9 %  sodium chloride infusion  100 mL Intravenous PRN Erick Colace, NP       acetaminophen (TYLENOL) tablet 650 mg  650 mg Oral Q4H PRN Erick Colace, NP       Or   acetaminophen (TYLENOL) suppository 650 mg  650 mg Rectal Q4H PRN Erick Colace, NP       allopurinol (ZYLOPRIM) tablet 200 mg  200 mg Oral Daily Salvadore Dom E, NP   200 mg at 10/08/20 1106   alteplase (CATHFLO ACTIVASE) injection 2 mg  2 mg Intracatheter Once PRN Erick Colace, NP       amLODipine (NORVASC) tablet 5 mg  5 mg Oral Daily Erick Colace, NP   5 mg at 10/08/20 1106   [START ON 10/10/2020] calcitRIOL (ROCALTROL) capsule 0.5 mcg  0.5 mcg Oral Q M,W,F-HD Reesa Chew, MD       Chlorhexidine Gluconate Cloth 2 % PADS 6 each  6 each Topical Daily Erick Colace, NP   6 each at 10/07/20 2031   [START ON 10/10/2020] Darbepoetin Alfa (ARANESP) injection 60 mcg  60 mcg Intravenous Q Mon-HD Reesa Chew, MD       dexamethasone (DECADRON) tablet 4 mg  4 mg Oral Q8H [name], MD   4 mg at 10/09/20 0146   docusate sodium (COLACE) capsule 100 mg  100 mg Oral BID PRN Erick Colace, NP       hydrALAZINE (APRESOLINE) injection 10 mg  10 mg Intravenous Q4H PRN Gilford Rile,  Remo Lipps, MD       HYDROcodone-acetaminophen (NORCO/VICODIN) 5-325 MG per tablet 1 tablet  1 tablet Oral Q4H PRN Erick Colace, NP   1 tablet at 10/07/20 2030   insulin aspart (novoLOG) injection 0-15 Units  0-15 Units Subcutaneous TID WC Erick Colace, NP   3 Units at 10/08/20 1808   labetalol (NORMODYNE) tablet 300 mg  300 mg Oral TID Erick Colace, NP   300 mg at 10/08/20 2109   levETIRAcetam (KEPPRA) tablet 250 mg  250 mg Oral BID [name], MD   250 mg at 10/08/20 2110   lidocaine (PF) (XYLOCAINE) 1 % injection 5 mL  5 mL Intradermal PRN Erick Colace, NP       lidocaine-prilocaine (EMLA)  cream 1 application  1 application Topical PRN Erick Colace, NP       losartan (COZAAR) tablet 25 mg  25 mg Oral Daily Reesa Chew, MD       mycophenolate (MYFORTIC) EC tablet 360 mg  360 mg Oral BID Erick Colace, NP   360 mg at 10/08/20 2110   ondansetron (ZOFRAN) tablet 4 mg  4 mg Oral Q4H PRN Erick Colace, NP       Or   ondansetron (ZOFRAN) injection 4 mg  4 mg Intravenous Q4H PRN Erick Colace, NP   4 mg at 10/06/20 1212   pantoprazole (PROTONIX) injection 40 mg  40 mg Intravenous QHS Erick Colace, NP   40 mg at 10/08/20 2110   pentafluoroprop-tetrafluoroeth (GEBAUERS) aerosol 1 application  1 application Topical PRN Erick Colace, NP       senna (SENOKOT) tablet 8.6 mg  1 tablet Oral BID Erick Colace, NP   8.6 mg at 10/08/20 2110   sevelamer carbonate (RENVELA) tablet 800 mg  800 mg Oral TID WC Reesa Chew, MD   800 mg at 10/08/20 1804   tacrolimus (PROGRAF) capsule 2 mg  2 mg Oral BID Reesa Chew, MD   2 mg at 10/08/20 2111      Review of Systems: 10 systems reviewed and negative except per interval history/subjective  Physical Exam: Vitals:   10/09/20 0356 10/09/20 0727  BP: (!) 158/83 (!) 154/81  Pulse: 74 73  Resp: 19 18  Temp: 98.1 F (36.7 C) 98.2 F (36.8 C)  SpO2: 100% 100%   No intake/output data recorded.  Intake/Output Summary (Last 24 hours) at 10/09/2020 0850 Last data filed at 10/09/2020 0356 Gross per 24 hour  Intake --  Output 2125 ml  Net -2125 ml   Constitutional: Lying in bed, no acute distress ENMT: ears and nose without scars or lesions, MMM CV: normal rate, trace edema in the bilateral lower extremities Respiratory: Bilateral chest rise, normal work of breathing Gastrointestinal: soft, non-tender, no palpable masses or hernias Skin: no visible lesions or rashes Psych: alert, judgement/insight seems diminished, appropriate mood and affect   Test Results I personally reviewed new and old clinical labs  and radiology tests Lab Results  Component Value Date   NA 134 (L) 10/09/2020   K 4.0 10/09/2020   CL 99 10/09/2020   CO2 24 10/09/2020   BUN 53 (H) 10/09/2020   CREATININE 8.11 (H) 10/09/2020   CALCIUM 6.9 (L) 10/09/2020   ALBUMIN 2.6 (L) 10/07/2020   PHOS 8.8 (H) 10/06/2020

## 2020-10-09 NOTE — Progress Notes (Signed)
IP rehab admissions - I met with patient this am.  He did provide me with his home phone number.  Called [phone] and spoke with his mother, Umberto Pavek, and his brother.  They asked many questions.  I explained about inpatient rehab.  Mom and brother do not drive and will have issues getting transportation to the hospital.  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  Mom is forgetful and asked the same questions many times.  Family and patient are in agreement to CIR.  Mom and brother will need education prior to DC home.  Patient slept on the floor in his home.  I will have my partner follow up tomorrow for further plans.  Patient will need some kind of rehab for a couple weeks prior to DC home.  Call for questions.  [phone]

## 2020-10-09 NOTE — PMR Pre-admission (Addendum)
PMR Admission Coordinator Pre-Admission Assessment  Patient: Christopher Chapman is an 65 y.o., male MRN: [id number] DOB: [birth year] Height: _0  (185.4 cm) Weight: 101.7 kg  Insurance Information HMO:     PPO:      PCP:      IPA:      80/20:      OTHER:  PRIMARY: Medicaid of [place]      Policy#: [id number]      Subscriber: patient CM Name:        Phone#:       Fax#:   Pre-Cert#:        Employer: Disabled, not working Benefits:  Phone #: [phone]     Name: Checked in passport one source Eff. Date: Eligible 10/09/20     Deduct:        Out of Pocket Max:        Life Max:   CIR: 100% per coverage guidelines      SNF:   Outpatient:       Co-Pay:   Home Health:        Co-Pay:   DME:       Co-Pay:   Providers: those that accept medicaid for payment  SECONDARY:        Policy#:       Phone#:    Development worker, community:        Phone#:    The Engineer, [employer]" for patients in Inpatient Rehabilitation Facilities with attached "Privacy Act [place] Records" was provided and verbally reviewed with: N/A  Emergency Contact Information Contact Information   None on File     Current Medical History  Patient Admitting Diagnosis: SDH, R craniotomy  History of Present Illness: Christopher Chapman is a 65 year old right-handed male with history of hypertension, prediabetes, gout, hyperlipidemia, chronic anemia, polysubstance/tobacco abuse, end-stage renal disease secondary to hypertension on hemodialysis for 5 years before receiving a DDRT(decreased donor renal transplantation) at [facility] 09/13/2016 followed by Dr. Moshe Cipro with [facility] and medical noncompliance.  Presented 10/05/2020 after being found down for unknown amount of time.  Admission chemistries hemoglobin 9.6, potassium 5.4, BUN 102, creatinine 17.95, CK 511, lactic acid 1.0, troponin 66, ammonia level 37, hemoglobin A1c 5.6.  Cranial CT scan showed subacute right-sided subdural hematoma with significant  mass-effect and leftward midline shift measuring 1.7 cm.  CT cervical spine negative.  Underwent right craniotomy for evacuation of subdural hematoma 10/05/2020 per Dr. Sherley Bounds.  Maintained on Keppra for seizure prophylaxis.  Nephrology services consulted placed on hemodialysis for progressive decline in renal function.  Hospital course follow-up cranial CT scan showed increasing right to left midline shift with increased effacement of the lateral ventricles and underwent subdural drain placement for recurrent right chronic subdural hematoma 10/14/2020 per Dr. Sherley Bounds..  Maintain on a regular diet.  Hemodialysis continues to follow plan is for outpatient hemodialysis Monday Wednesday Fridays with latest creatinine improved to 7.16.  Acute on chronic anemia latest hemoglobin 7.1.  Therapy evaluations completed due to patient decreased functional mobility was recommended for a comprehensive rehab program.     Complete NIHSS TOTAL: 0  Patient's medical record from [facility] has been reviewed by the rehabilitation admission coordinator and physician.  Past Medical History  Past Medical History:  Diagnosis Date   Anemia    Arthritis    CRF (chronic renal failure)    ESRD (end stage renal disease) ([place])    Family history of anesthesia complication    [place] going  to sleep.   Gout    Hx of cocaine abuse ([place])    Hyperlipidemia    Hypertension    Hyperuricemia    MRSA (methicillin resistant staph aureus) culture positive 07/2011   Parathyroid disease ([place])    Hx of , No meds at present   Tobacco abuse    Tuberculosis 1985   Treated with imeds for a years.    Has the patient had major surgery during 100 days prior to admission? Yes  Family History   family history includes Diabetes in his father and sister; Healthy in his mother; Heart disease in his father and sister; Hypertension in his father; Other in his father.  Current Medications  Current Facility-Administered  Medications:    0.9 %  sodium chloride infusion, 100 mL, Intravenous, PRN, [name], MD   0.9 %  sodium chloride infusion, 100 mL, Intravenous, PRN, [name], MD   acetaminophen (TYLENOL) tablet 650 mg, 650 mg, Oral, Q4H PRN **OR** acetaminophen (TYLENOL) suppository 650 mg, 650 mg, Rectal, Q4H PRN, [name], MD   allopurinol (ZYLOPRIM) tablet 200 mg, 200 mg, Oral, Daily, [name], MD, 200 mg at 10/17/20 0954   alteplase (CATHFLO ACTIVASE) injection 2 mg, 2 mg, Intracatheter, Once PRN, [name], MD   amLODipine (NORVASC) tablet 5 mg, 5 mg, Oral, Daily, [name], MD, 5 mg at 10/17/20 3832   calcitRIOL (ROCALTROL) capsule 0.5 mcg, 0.5 mcg, Oral, Q M,W,F-HD, [name], MD, 0.5 mcg at 10/17/20 1140   Chlorhexidine Gluconate Cloth 2 % PADS 6 each, 6 each, Topical, Daily, [name], MD, 6 each at 10/17/20 9191   Darbepoetin Alfa (ARANESP) injection 60 mcg, 60 mcg, Intravenous, Q Mon-HD, [name], MD, 60 mcg at 10/10/20 1831   docusate sodium (COLACE) capsule 100 mg, 100 mg, Oral, BID PRN, [name], MD, 100 mg at 10/17/20 0950   hydrALAZINE (APRESOLINE) injection 10 mg, 10 mg, Intravenous, Q4H PRN, [name], MD   HYDROcodone-acetaminophen (NORCO/VICODIN) 5-325 MG per tablet 1 tablet, 1 tablet, Oral, Q4H PRN, [name], MD, 1 tablet at 10/14/20 1347   insulin aspart (novoLOG) injection 0-15 Units, 0-15 Units, Subcutaneous, TID WC, [name], MD, 2 Units at 10/16/20 6606   labetalol (NORMODYNE) injection 10-40 mg, 10-40 mg, Intravenous, Q10 min PRN, [name], MD   labetalol (NORMODYNE) tablet 300 mg, 300 mg, Oral, TID, [name], MD, 300 mg at 10/17/20 0045   levETIRAcetam (KEPPRA) tablet 250 mg, 250 mg, Oral, BID, [name], MD, 250 mg at 10/17/20 0955   lidocaine (PF) (XYLOCAINE) 1 % injection 5 mL, 5 mL, Intradermal, PRN, [name], MD   lidocaine-prilocaine (EMLA) cream 1 application, 1 application, Topical,  PRN, [name], MD   losartan (COZAAR) tablet 25 mg, 25 mg, Oral, Daily, [name], MD, 25 mg at 10/17/20 9977   menthol-cetylpyridinium (CEPACOL) lozenge 3 mg, 1 lozenge, Oral, PRN, Lucious Groves, DO   morphine 2 MG/ML injection 1-2 mg, 1-2 mg, Intravenous, Q2H PRN, [name], MD, 2 mg at 10/14/20 1601   multivitamin (RENA-VIT) tablet 1 tablet, 1 tablet, Oral, QHS, Elmarie Shiley, MD   mycophenolate (MYFORTIC) EC tablet 360 mg, 360 mg, Oral, BID, [name], MD, 360 mg at 10/17/20 0955   ondansetron (ZOFRAN) tablet 4 mg, 4 mg, Oral, Q4H PRN **OR** ondansetron (ZOFRAN) injection 4 mg, 4 mg, Intravenous, Q4H PRN, Sherley Bounds  S, MD, 4 mg at 10/06/20 1212   pantoprazole (PROTONIX) EC tablet 40 mg, 40 mg, Oral, QHS, [name], MD, 40 mg at 10/16/20 2126   pentafluoroprop-tetrafluoroeth (GEBAUERS) aerosol 1 application, 1 application, Topical, PRN, [name], MD   senna [facility]) tablet 8.6 mg, 1 tablet, Oral, BID, [name], MD, 8.6 mg at 10/17/20 0950   sevelamer carbonate (RENVELA) tablet 800 mg, 800 mg, Oral, TID WC, [name], MD, 800 mg at 10/17/20 1140   tacrolimus (PROGRAF) capsule 2 mg, 2 mg, Oral, BID, [name], MD, 2 mg at 10/17/20 2831  Patients Current Diet:  Diet Order             Diet renal/carb modified with fluid restriction Diet-HS Snack? Nothing; Fluid restriction: 1200 mL Fluid; Room service appropriate? Yes; Fluid consistency: Thin  Diet effective now                   Precautions / Restrictions Precautions Precautions: Fall, Other (comment) Precaution Comments: Subdural drain, JP bulb Restrictions Weight Bearing Restrictions: No   Has the patient had 2 or more falls or a fall with injury in the past year? Yes  Prior Activity Level Community (5-7x/wk): Went out daily.  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  Not working.  Prior Functional Level Self Care: Did the patient need help bathing, dressing, using the toilet  or eating? Independent  Indoor Mobility: Did the patient need assistance with walking from room to room (with or without device)? Independent  Stairs: Did the patient need assistance with internal or external stairs (with or without device)? Independent  Functional Cognition: Did the patient need help planning regular tasks such as shopping or remembering to take medications? Independent  Patient Information Are you of Hispanic, Latino/a,or Spanish origin?: A. No, not of Hispanic, Latino/a, or Spanish origin What is your race?: B. Black or African American Do you need or want an interpreter to communicate with a doctor or health care staff?: 0. No  Patient's Response To:  Health Literacy and Transportation Is the patient able to respond to health literacy and transportation needs?: Yes Health Literacy - How often do you need to have someone help you when you read instructions, pamphlets, or other written material from your doctor or pharmacy?: Never In the past 12 months, has lack of transportation kept you from medical appointments or from getting medications?: No In the past 12 months, has lack of transportation kept you from meetings, work, or from getting things needed for daily living?: No  Development worker, international aid / [place] Devices/Equipment: None  Prior Device Use: Indicate devices/aids used by the patient prior to current illness, exacerbation or injury? None of the above  Current Functional Level Cognition  Overall Cognitive Status: Impaired/Different from baseline Current Attention Level: Focused Orientation Level: Oriented X4 Following Commands: Follows multi-step commands inconsistently Safety/Judgement: Decreased awareness of safety, Decreased awareness of deficits General Comments: Pt continues with decreased insight into deficits/safety, repeatedly asking if he could now get up by himself despite instruction he could not with the subdural drain.     Extremity Assessment (includes Sensation/Coordination)  Upper Extremity Assessment: Defer to OT evaluation LUE Deficits / Details: requires tactile or verbal cues to use LUE functionally, does not appreciate being told "what to do" so difficult to assess LUE Sensation: decreased proprioception LUE Coordination: decreased fine motor  Lower Extremity Assessment: LLE deficits/detail LLE Deficits / Details: Hip flexion 4/5, knee  extension and ankle dorsiflexion 5/5 LLE Sensation: decreased light touch, decreased proprioception LLE Coordination: decreased fine motor, decreased gross motor    ADLs  Overall ADL's : Needs assistance/impaired Eating/Feeding: Set up, Sitting Eating/Feeding Details (indicate cue type and reason): spillage noted and bacon in bed on arrival. pt unaware of time of day but had eaten breakfast prior to arrival Grooming: Wash/dry hands, Applying deodorant, Oral care, Minimal assistance Grooming Details (indicate cue type and reason): assist to sequence and attend to task Upper Body Bathing: Moderate assistance, Bed level Lower Body Bathing: Maximal assistance, Bed level Upper Body Dressing : Maximal assistance, Bed level Lower Body Dressing: Moderate assistance, Sitting/lateral leans Toilet Transfer: Maximal assistance, Squat-pivot, BSC Toilet Transfer Details (indicate cue type and reason): unable to safely attempt with +1 assist due to pt impulsivity Toileting- Clothing Manipulation and Hygiene: Sit to/from stand, Maximal assistance Functional mobility during ADLs: Moderate assistance General ADL Comments: pt very impulsive, poor safety awareness and insight into his deficits. verbal or tactile cues throughout, also requires assist for LLE during ambulation and LUE during functional task    Mobility  Overal bed mobility: Needs Assistance Bed Mobility: Supine to Sit Rolling: Min assist Supine to sit: Supervision Sit to supine: Mod assist General bed mobility  comments: close supervision    Transfers  Overall transfer level: Needs assistance Equipment used: Rolling walker (2 wheeled) Transfers: Sit to/from Stand Sit to Stand: Supervision Stand pivot transfers: Mod assist General transfer comment: Close supervision for safety, pt preferring for PT to "back up so he could do it for himself." Use of momentum to power up    Ambulation / Gait / Stairs / Wheelchair Mobility  Ambulation/Gait Ambulation/Gait assistance: Min guard, +2 safety/equipment Gait Distance (Feet): 20 Feet Assistive device: Rolling walker (2 wheeled) Gait Pattern/deviations: Decreased stride length, Step-to pattern, Step-through pattern General Gait Details: slow but steady pace, min guard +2 safety, improved left foot clearance Gait velocity: decreased    Posture / Balance Dynamic Sitting Balance Sitting balance - Comments: UTA, pt refusing EOB Balance Overall balance assessment: Needs assistance Sitting-balance support: Feet supported Sitting balance-Leahy Scale: Good Sitting balance - Comments: UTA, pt refusing EOB Postural control: Right lateral lean Standing balance support: Bilateral upper extremity supported Standing balance-Leahy Scale: Poor Standing balance comment: reliant on RW    Special needs/care consideration Continuous Drip IV  0.9% NS prn , Dialysis: Hemodialysis Monday, Wednesday, and Friday, Skin R scalp post op craniotomy with staples, and Special service needs n/a   Previous Home Environment (from acute therapy documentation) Living Arrangements: Parent, Other relatives Available Help at Discharge: Available PRN/intermittently Type of Home: House Home Layout: Multi-level Home Care Services: No Additional Comments: Pt reports he lives in a split level home with his 49 y.o. mother, whom he provides some care for (cognitive deficits), and his retired brother.  He was unable to provide further info re: home set up  Discharge Living Setting Plans  for Discharge Living Setting: House, Lives with (comment) (Lives with 44 yo mom and 32 yo brother.) Type of Home at Discharge: House Discharge Home Layout: Multi-level (Split level house) Alternate Level Stairs-Number of Steps: 4-6 steps Discharge Home Access: Stairs to enter Entrance Stairs-Rails: Left Entrance Stairs-Number of Steps: 5-6 steps Discharge Bathroom Shower/Tub: Walk-in shower, Door Discharge Bathroom Toilet: Standard Discharge Bathroom Accessibility: Yes How Accessible: Accessible via walker Does the patient have any problems obtaining your medications?: No  Social/Family/Support Systems Patient Roles: Other (Comment) (Has a mother and a brother.) Contact Information:  [place] - mother - [phone] Anticipated Caregiver: mom and brother Ability/Limitations of Caregiver: Mom is 57 and is retired and can help some.  Brother works cutting grass but can also help after discharge. 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  Mom and brother do not drive.)  Goals Patient/Family Goal for Rehab: PT/OT supervision to min assist goals Expected length of stay: 10-14 days Pt/Family Agrees to Admission and willing to participate: Yes Program Orientation Provided & Reviewed with Pt/Caregiver Including Roles  & Responsibilities: Yes  Decrease burden of Care through IP rehab admission: N/A  Possible need for SNF placement upon discharge: Not planned  Patient Condition: I have reviewed medical records from [facility], spoken with CM, and patient and family member. I met with patient at the bedside and discussed via phone for inpatient rehabilitation assessment.  Patient will benefit from ongoing PT, OT, and SLP, can actively participate in 3 hours of therapy a day 5 days  of the week, and can make measurable gains during the admission.  Patient will also benefit from the coordinated team approach during an Inpatient Acute Rehabilitation admission.  The patient will receive intensive therapy as well as Rehabilitation physician, nursing, social worker, and care management interventions.  Due to bladder management, bowel management, safety, skin/wound care, disease management, medication administration, pain management, and patient education the patient requires 24 hour a day rehabilitation nursing.  The patient is currently min assist to min guard with mobility and basic ADLs.  Discharge setting and therapy post discharge at home with home health is anticipated.  Patient has agreed to participate in the Acute Inpatient Rehabilitation Program and will admit today.  Preadmission Screen Completed By: Retta Diones with day of admit updates by Michel Santee, 10/17/2020 1:20 PM ______________________________________________________________________   Discussed status with Dr. Letta Pate on 10/17/20  at 1:20 PM  and received approval for admission today.  Admission Coordinator:  Michel Santee, PT, time 1:20 PM Sudie Grumbling 10/17/20    Assessment/Plan: Diagnosis:Right SDH Does the need for close, 24 hr/day Medical supervision in concert with the patient's rehab needs make it unreasonable for this patient to be served in a less intensive setting? Yes Co-Morbidities requiring supervision/potential complications: ESRD on HD, HTN, hx polysubstance abuse, anemia of chronic disease Due to bladder management, bowel management, safety, skin/wound care, disease management, medication administration, pain management, and patient education, does the patient require 24 hr/day rehab nursing? Yes Does the patient require coordinated care of a physician, rehab nurse, PT, OT, and SLP to address physical and functional deficits in the context of the above medical diagnosis(es)? Yes Addressing  deficits in the following areas: balance, endurance, locomotion, strength, transferring, bowel/bladder control, bathing, dressing, feeding, grooming, toileting, cognition, and psychosocial support Can the patient actively participate in an intensive therapy program of at least 3 hrs of therapy 5 days a week? Yes The potential for patient to make measurable gains while on inpatient rehab is good Anticipated functional outcomes upon discharge from inpatient rehab: modified independent and supervision PT, modified independent and supervision OT, modified independent and supervision SLP Estimated rehab length of stay to reach the above functional goals is: 10-14d Anticipated discharge destination: Home 10. Overall Rehab/Functional Prognosis: good   MD Signature: Charlett Blake M.D. [employer] Fellow Am Acad of Phys Med and  Rehab Diplomate Am Board of Electrodiagnostic Med Fellow Am Board of Interventional Pain

## 2020-10-10 DIAGNOSIS — R4182 Altered mental status, unspecified: Secondary | ICD-10-CM | POA: Diagnosis not present

## 2020-10-10 DIAGNOSIS — N184 Chronic kidney disease, stage 4 (severe): Secondary | ICD-10-CM | POA: Diagnosis not present

## 2020-10-10 LAB — GLUCOSE, CAPILLARY
Glucose-Capillary: 166 mg/dL — ABNORMAL HIGH (ref 70–99)
Glucose-Capillary: 159 mg/dL — ABNORMAL HIGH (ref 70–99)
Glucose-Capillary: 210 mg/dL — ABNORMAL HIGH (ref 70–99)

## 2020-10-10 LAB — CBC
HCT: 25.5 % — ABNORMAL LOW (ref 39.0–52.0)
Hemoglobin: 8 g/dL — ABNORMAL LOW (ref 13.0–17.0)
MCH: 27.2 pg (ref 26.0–34.0)
MCHC: 31.4 g/dL (ref 30.0–36.0)
MCV: 86.7 fL (ref 80.0–100.0)
Platelets: 157 10*3/uL (ref 150–400)
RBC: 2.94 MIL/uL — ABNORMAL LOW (ref 4.22–5.81)
RDW: 15.8 % — ABNORMAL HIGH (ref 11.5–15.5)
WBC: 10.9 10*3/uL — ABNORMAL HIGH (ref 4.0–10.5)
nRBC: 0.4 % — ABNORMAL HIGH (ref 0.0–0.2)

## 2020-10-10 LAB — CULTURE, BLOOD (ROUTINE X 2)
Culture: NO GROWTH
Special Requests: ADEQUATE

## 2020-10-10 LAB — BASIC METABOLIC PANEL
Anion gap: 14 (ref 5–15)
BUN: 73 mg/dL — ABNORMAL HIGH (ref 8–23)
CO2: 21 mmol/L — ABNORMAL LOW (ref 22–32)
Calcium: 6.4 mg/dL — CL (ref 8.9–10.3)
Chloride: 97 mmol/L — ABNORMAL LOW (ref 98–111)
Creatinine, Ser: 9.47 mg/dL — ABNORMAL HIGH (ref 0.61–1.24)
GFR, Estimated: 6 mL/min — ABNORMAL LOW (ref >60)
Glucose, Bld: 166 mg/dL — ABNORMAL HIGH (ref 70–99)
Potassium: 4.4 mmol/L (ref 3.5–5.1)
Sodium: 132 mmol/L — ABNORMAL LOW (ref 135–145)

## 2020-10-10 MED ORDER — PNEUMOCOCCAL VAC POLYVALENT 25 MCG/0.5ML IJ INJ
0.5000 mL | INJECTION | INTRAMUSCULAR | Status: AC
  Administered 2020-10-12: 0.5 mL via INTRAMUSCULAR
  Filled 2020-10-10: qty 0.5

## 2020-10-10 MED ORDER — INFLUENZA VAC SPLIT QUAD 0.5 ML IM SUSY
0.5000 mL | PREFILLED_SYRINGE | INTRAMUSCULAR | Status: AC
  Administered 2020-10-11: 0.5 mL via INTRAMUSCULAR
  Filled 2020-10-10: qty 0.5

## 2020-10-10 MED ORDER — PANTOPRAZOLE SODIUM 40 MG PO TBEC
40.0000 mg | DELAYED_RELEASE_TABLET | Freq: Every day | ORAL | Status: DC
  Administered 2020-10-10 – 2020-10-16 (×7): 40 mg via ORAL
  Filled 2020-10-10 (×7): qty 1

## 2020-10-10 NOTE — Progress Notes (Signed)
Inpatient Rehab Admissions Coordinator:   Following for my colleague Karene Fry. I will not have a bed for this patient to admit today.  Will follow.   Shann Medal, PT, DPT Admissions Coordinator [phone] 10/10/20  11:30 AM

## 2020-10-10 NOTE — Plan of Care (Signed)

## 2020-10-10 NOTE — Progress Notes (Signed)
Physical Therapy Treatment Patient Details Name: Christopher Chapman MRN: [id number] DOB: 08-22-1955 Today's Date: 10/10/2020   History of Present Illness 65 y.o. male admitted after being found down for unknown amount of time.  He was found to have large R sided chronic SDH with 1/7 cm midline shift and mass effect.   He underwent craniotomy for evacuation. Pt to start HD.   PMH includes: Chronic renal failure, ESRD, gout, h/o cocaine abuse, HTN    PT Comments    Pt with inconsistent mood t/o session. Pt joking and having fun then transitions to irritation when PT/OT would give direction/cues for task or educated on safe ways to complete ADLs or transfers, ie. Donning socks, holding onto objects while ambulating. Pt very impulsive with decreased insight to deficits or awareness of situation. Pt to greatly benefit from CIR to address both cognitive and functional deficits. Pt very motivated to move however has decreased insight to L hemiplegia, impaired balance, and inability to sequence stepping. Acute PT to cont to follow.    Recommendations for follow up therapy are one component of a multi-disciplinary discharge planning process, led by the attending physician.  Recommendations may be updated based on patient status, additional functional criteria and insurance authorization.  Follow Up Recommendations  CIR     Equipment Recommendations  Other (comment) (TBD)    Recommendations for Other Services Rehab consult     Precautions / Restrictions Precautions Precautions: Fall Precaution Comments: fall risk Restrictions Weight Bearing Restrictions: No     Mobility  Bed Mobility Overal bed mobility: Needs Assistance Bed Mobility: Supine to Sit;Sit to Supine     Supine to sit: Min assist Sit to supine: Min assist   General bed mobility comments: minA for LE management, pt benefits from verbal cues fro sequencing however does not appreciate being given direction     Transfers Overall transfer level: Needs assistance Equipment used: 1 person hand held assist Transfers: Sit to/from Stand Sit to Stand: Min assist         General transfer comment: Min A for LLE management sit<>stand, verbal cues for L foot placement as pt initially with L LE extended out straight, minA to bring L foot back towards bed, pt with R lateral lean  Ambulation/Gait Ambulation/Gait assistance: Max assist;+2 physical assistance;+2 safety/equipment Gait Distance (Feet): 2 Feet (to/from sink)   Gait Pattern/deviations: Step-to pattern;Decreased weight shift to left;Wide base of support;Decreased dorsiflexion - left Gait velocity: decreased   General Gait Details: pt refused to use RW, attempted to give HHA however pt reaching for sink and computer stating "I can just hold onto this" pt however unable to advance L LE and is dragging it across the floor while PT/OT providing support via gait belt. when pt would allow for instruction pt would lift up L foot for 2 steps and then revert back to dragging   Stairs             Wheelchair Mobility    Modified Rankin (Stroke Patients Only) Modified Rankin (Stroke Patients Only) Pre-Morbid Rankin Score: No significant disability Modified Rankin: Severe disability     Balance Overall balance assessment: Needs assistance Sitting-balance support: Feet supported Sitting balance-Leahy Scale: Fair Sitting balance - Comments: completed grooming while sitting in the chair unsupported from back rest, no apparent LOB Postural control: Posterior lean Standing balance support: Single extremity supported;During functional activity Standing balance-Leahy Scale: Poor Standing balance comment: Pt had LOB during standing task and repeatedly asked to sit in chair "so  I don't fall down"                            Cognition Arousal/Alertness: Awake/alert Behavior During Therapy: Impulsive Overall Cognitive Status:  Impaired/Different from baseline Area of Impairment: Attention;Memory;Following commands;Safety/judgement;Orientation                 Orientation Level: Disoriented to;Situation (unsure how "I broke my head open") Current Attention Level: Focused Memory: Decreased recall of precautions;Decreased short-term memory Following Commands: Follows one step commands inconsistently (easily distracted and becomes irritated when given directions or are repeated) Safety/Judgement: Decreased awareness of safety;Decreased awareness of deficits Awareness: Intellectual Problem Solving: Slow processing General Comments: pt very tangential and off topic at times. pt educated on time, location and reason for admission. pt able to recall information after 5 minutes. pt unable to apply education on deficits to functional task; pt asked to 1. walk to sink & 2. brush his teeth, he required max verbal cues to sequence task and once at the sink did not remeber step #2. pt LOB without any righting balance strategies. pt lacks awareness to L side of body weakness. pt easily frustrated and irritated with verbal cues stating "why does everyone have to be in control. I will get there, I won't fall, trust me." Attempted to educate pt on L hemi paresis due to [facility] however pt not comprehending      Exercises      General Comments General comments (skin integrity, edema, etc.): VSS on RA, worked with OT at sink, PT attempted to provide support at L LE to improved stability in standing      Pertinent Vitals/Pain Pain Assessment: No/denies pain    Home Living                      Prior Function            PT Goals (current goals can now be found in the care plan section) Acute Rehab PT Goals Patient Stated Goal: to get up PT Goal Formulation: Patient unable to participate in goal setting Time For Goal Achievement: 10/21/20 Potential to Achieve Goals: Fair Progress towards PT goals: Progressing toward  goals    Frequency    Min 4X/week      PT Plan Current plan remains appropriate    Co-evaluation PT/OT/SLP Co-Evaluation/Treatment: Yes Reason for Co-Treatment: Complexity of the patient's impairments (multi-system involvement) PT goals addressed during session: Mobility/safety with mobility OT goals addressed during session: ADL's and self-care;Proper use of Adaptive equipment and DME      AM-PAC PT "6 Clicks" Mobility   Outcome Measure  Help needed turning from your back to your side while in a flat bed without using bedrails?: A Lot Help needed moving from lying on your back to sitting on the side of a flat bed without using bedrails?: A Lot Help needed moving to and from a bed to a chair (including a wheelchair)?: A Lot Help needed standing up from a chair using your arms (e.g., wheelchair or bedside chair)?: A Lot Help needed to walk in hospital room?: A Lot Help needed climbing 3-5 steps with a railing? : A Lot 6 Click Score: 12    End of Session Equipment Utilized During Treatment: Gait belt Activity Tolerance: Patient tolerated treatment well Patient left: in bed;with call bell/phone within reach;with bed alarm set (pt unsafe and not trustworthy to reside in chair) Nurse Communication: Mobility status PT  Visit Diagnosis: Unsteadiness on feet (R26.81);Muscle weakness (generalized) (M62.81);Other abnormalities of gait and mobility (R26.89);Difficulty in walking, not elsewhere classified (R26.2);Other symptoms and signs involving the nervous system (R29.898);Hemiplegia and hemiparesis Hemiplegia - Right/Left: Left Hemiplegia - dominant/non-dominant: Non-dominant Hemiplegia - caused by:  (SDH)     Time: 0930-1002 PT Time Calculation (min) (ACUTE ONLY): 32 min  Charges:  $Neuromuscular Re-education: 8-22 mins                     Kittie Plater, PT, DPT Acute Rehabilitation Services Pager #: [phone] Office #: [phone]    Berline Lopes 10/10/2020, 1:32  PM

## 2020-10-10 NOTE — Progress Notes (Signed)
On call doctor notified of Calcium level of 6.4 today and pt due to go to HD today. No new orders

## 2020-10-10 NOTE — Progress Notes (Signed)
Nephrology Follow-Up Consult note   Assessment/Recommendations: Christopher Chapman is a/an 65 y.o. 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 Creatinine was already in high 5's range in 03/17/20 and he was counseled that he was nearing ESRD again but missed his f/u appt 2 mths later. He is almost certainly ESRD again now; h/o noncompliance and currently with poor social support thus may not be a candidate for another transplant. -Plan for dialysis today and likely maintain MWF schedule - Started CLIP process (renal navigator notified) - Dose medications for ESRD and avoid nephrotoxic agents as he still has a little residual renal function. - Cut Tac in half to 2mg  BID but overall continue immunosuppressives for now until everything is sorted out but appears that he may not have the social support for another transplant. Therefore alloimmunization may be less of a concern. -Possibly going to rehab   Renal osteodystrophy -phosphorus 8.8. Started sevelamer. PTH 440. Calcium low. 3 Ca bath and calcitriol 0.5 MWF for now. Anemia of CKD: Iron saturation 30 with ferritin of 712.  Hemoglobin 8s. Starting aranesp 63mcg with dialysis 10/3. HTN - BP now improved. Watch with HD. SDH - s/p evacuation 9/28. H/o polysubstance abuse     [facility] 10/10/2020 7:59 AM  ___________________________________________________________  CC: ESRD  Interval History/Subjective: Patient continues to feel well without any complaints except productive cough.    Medications:  Current Facility-Administered Medications  Medication Dose Route Frequency Provider Last Rate Last Admin   0.9 %   sodium chloride infusion  100 mL Intravenous PRN Erick Colace, NP       0.9 %  sodium chloride infusion  100 mL Intravenous PRN Erick Colace, NP       acetaminophen (TYLENOL) tablet 650 mg  650 mg Oral Q4H PRN Erick Colace, NP       Or   acetaminophen (TYLENOL) suppository 650 mg  650 mg Rectal Q4H PRN Erick Colace, NP       allopurinol (ZYLOPRIM) tablet 200 mg  200 mg Oral Daily Salvadore Dom E, NP   200 mg at 10/09/20 1027   alteplase (CATHFLO ACTIVASE) injection 2 mg  2 mg Intracatheter Once PRN Erick Colace, NP       amLODipine (NORVASC) tablet 5 mg  5 mg Oral Daily Erick Colace, NP   5 mg at 10/09/20 1027   calcitRIOL (ROCALTROL) capsule 0.5 mcg  0.5 mcg Oral Q M,W,F-HD Reesa Chew, MD       Chlorhexidine Gluconate Cloth 2 % PADS 6 each  6 each Topical Daily Erick Colace, NP   6 each at 10/09/20 1100   Darbepoetin Alfa (ARANESP) injection 60 mcg  60 mcg Intravenous Q Mon-HD Reesa Chew, MD       dexamethasone (DECADRON) tablet 4 mg  4 mg Oral Q8H [name], MD   4 mg at 10/10/20 0136   docusate sodium (COLACE) capsule 100 mg  100 mg Oral BID PRN Erick Colace, NP       hydrALAZINE (APRESOLINE) injection 10 mg  10 mg Intravenous Q4H PRN Maudie Mercury, MD       HYDROcodone-acetaminophen (NORCO/VICODIN) 5-325 MG per  tablet 1 tablet  1 tablet Oral Q4H PRN Erick Colace, NP   1 tablet at 10/07/20 2030   insulin aspart (novoLOG) injection 0-15 Units  0-15 Units Subcutaneous TID WC Erick Colace, NP   3 Units at 10/10/20 2585   labetalol (NORMODYNE) tablet 300 mg  300 mg Oral TID Erick Colace, NP   300 mg at 10/09/20 2217   levETIRAcetam (KEPPRA) tablet 250 mg  250 mg Oral BID [name], MD   250 mg at 10/09/20 2217   lidocaine (PF) (XYLOCAINE) 1 % injection 5 mL  5 mL Intradermal PRN Erick Colace, NP       lidocaine-prilocaine (EMLA) cream 1 application  1 application Topical PRN Erick Colace, NP       losartan (COZAAR)  tablet 25 mg  25 mg Oral Daily Reesa Chew, MD   25 mg at 10/09/20 1027   mycophenolate (MYFORTIC) EC tablet 360 mg  360 mg Oral BID Erick Colace, NP   360 mg at 10/09/20 2217   ondansetron (ZOFRAN) tablet 4 mg  4 mg Oral Q4H PRN Erick Colace, NP       Or   ondansetron [facility]) injection 4 mg  4 mg Intravenous Q4H PRN Erick Colace, NP   4 mg at 10/06/20 1212   pantoprazole (PROTONIX) injection 40 mg  40 mg Intravenous QHS Erick Colace, NP   40 mg at 10/09/20 2216   pentafluoroprop-tetrafluoroeth (GEBAUERS) aerosol 1 application  1 application Topical PRN Erick Colace, NP       senna (SENOKOT) tablet 8.6 mg  1 tablet Oral BID Erick Colace, NP   8.6 mg at 10/09/20 2217   sevelamer carbonate (RENVELA) tablet 800 mg  800 mg Oral TID WC Reesa Chew, MD   800 mg at 10/09/20 1658   tacrolimus (PROGRAF) capsule 2 mg  2 mg Oral BID Reesa Chew, MD   2 mg at 10/09/20 2218      Review of Systems: 10 systems reviewed and negative except per interval history/subjective  Physical Exam: Vitals:   10/09/20 2348 10/10/20 0324  BP: (!) 148/74 132/78  Pulse: 71 72  Resp: 20 19  Temp: 98.2 F (36.8 C) 97.7 F (36.5 C)  SpO2: 100% 99%   No intake/output data recorded.  Intake/Output Summary (Last 24 hours) at 10/10/2020 0759 Last data filed at 10/10/2020 0500 Gross per 24 hour  Intake 2060 ml  Output 850 ml  Net 1210 ml    Constitutional: Lying in bed, no acute distress  ENMT: ears and nose without scars or lesions, MMM CV: normal rate Respiratory: Bilateral chest rise, normal work of breathing with a few scattered rhonchi, no rales Gastrointestinal: soft, non-tender Skin: no visible lesions or rashes Psych: alert, calm Dialysis access: LUE AVF +T/b Extr: no edema   Test Results I personally reviewed new and old clinical labs and radiology tests Lab Results  Component Value Date   NA 132 (L) 10/10/2020   K 4.4 10/10/2020   CL 97 (L) 10/10/2020    CO2 21 (L) 10/10/2020   BUN 73 (H) 10/10/2020   CREATININE 9.47 (H) 10/10/2020   CALCIUM 6.4 (LL) 10/10/2020   ALBUMIN 2.6 (L) 10/07/2020   PHOS 8.8 (H) 10/06/2020

## 2020-10-10 NOTE — Progress Notes (Signed)
Pt is off the floor and in transport to HD

## 2020-10-10 NOTE — Progress Notes (Signed)
Subjective:   O/N Events: None  Patient was seen this morning during bedside rounds.  He does not endorse any specific complaints or concerns at this time.  He states that he is able to go to the bathroom by himself without falling or without help from staff.   Objective:  Vital signs in last 24 hours: Vitals:   10/09/20 1536 10/09/20 2003 10/09/20 2348 10/10/20 0324  BP: (!) 151/89 125/87 (!) 148/74 132/78  Pulse: 84 76 71 72  Resp: 18 20 20 19   Temp: 98 F (36.7 C) 98 F (36.7 C) 98.2 F (36.8 C) 97.7 F (36.5 C)  TempSrc:  Oral Oral Oral  SpO2: 98% 98% 100% 99%  Weight:      Height:       CBC Latest Ref Rng & Units 10/10/2020 10/09/2020 10/08/2020  WBC 4.0 - 10.5 K/uL 10.9(H) 11.0(H) 12.8(H)  Hemoglobin 13.0 - 17.0 g/dL 8.0(L) 8.1(L) 8.5(L)  Hematocrit 39.0 - 52.0 % 25.5(L) 24.8(L) 26.0(L)  Platelets 150 - 400 K/uL 157 136(L) 156    BMP Latest Ref Rng & Units 10/10/2020 10/09/2020 10/08/2020  Glucose 70 - 99 mg/dL 166(H) 154(H) 150(H)  BUN 8 - 23 mg/dL 73(H) 53(H) 89(H)  Creatinine 0.61 - 1.24 mg/dL 9.47(H) 8.11(H) 12.72(H)  BUN/Creat Ratio 10 - 24 - - -  Sodium 135 - 145 mmol/L 132(L) 134(L) 134(L)  Potassium 3.5 - 5.1 mmol/L 4.4 4.0 4.3  Chloride 98 - 111 mmol/L 97(L) 99 101  CO2 22 - 32 mmol/L 21(L) 24 20(L)  Calcium 8.9 - 10.3 mg/dL 6.4(LL) 6.9(L) 6.5(L)     Physical Exam Constitutional:      General: He is not in acute distress.    Appearance: Normal appearance. He is not ill-appearing, toxic-appearing or diaphoretic.     Comments: Resting comfortably in bed, answers questions appropriately, no acute distress  HENT:     Head:     Comments: R surgical site shows no erythema, drainage, or purulence. Staples intact.  Cardiovascular:     Rate and Rhythm: Normal rate and regular rhythm.     Pulses: Normal pulses.     Heart sounds: Normal heart sounds. No murmur heard.   No friction rub. No gallop.  Pulmonary:     Effort: Pulmonary effort is normal.     Breath  sounds: Normal breath sounds. No wheezing, rhonchi or rales.  Musculoskeletal:     Right lower leg: No edema.     Left lower leg: No edema.     Comments:    Skin:    General: Skin is warm and dry.  Neurological:     General: No focal deficit present.     Mental Status: He is alert and oriented to person, place, and time.  Psychiatric:        Behavior: Behavior normal.     Comments: Somewhat tangential speech     Assessment/Plan:  Active Problems:   Subdural hematoma   Altered mental status   ABLA (acute blood loss anemia)   Stage 4 chronic kidney disease (HCC)   S/P craniotomy  Pearlean Brownie is a 65 y/o M with a PMHx HTN, ETOH Abuse, cocaine abuse, DM, and ESRD w/kidney transplant, who presented for AMS and was found to have a SDH and admitted for Craniotomy and evacuation.   Subacute R SDH now S/P R Craniotomy and Evacuation:  Craniotomy and evacuation on 10/05/2020. Mentating well at bedside, fully conversant, and alert and oriented x3. Appears to be  a candidate for CIR placement. - Appreciate neurosurgery recommendations - Continue Frequent neuro checks - Continue keppra 250 mg BID - Discontinued Decadron regimen today  Hx CKD S/P Renal Transplant  w/ Progressive ESRD Being followed by Nephrology, receiving iHD, CLIP process underway - Continue mycophenolate and tacro per nephrology - Continue BP control  - HD per nephrology today - Trend BMP   HTN:   - Amlodipine 5 mg daily - Labetolol 300 mg TID - PRN hydralazine   Leukocytosis, likely reactive Staph epi on 1/2 plates, likely contaminate, leukocytosis continues to downtrend. - Trend CBC   DM:  - Continue SSI   Acute Blood Loss Anemia Platelet Dysfunction in the Setting of Uremia:  Patient s/p 1 PRBC 9/28  - Trend CBC - Transfuse for Hgb < 7    Hx Polysubstance Abuse:  - Will need substance abuse counseling   Prior to Admission Living Arrangement: Home Anticipated Discharge Location: Appropriate  candidate for CIR Barriers to Discharge: Continued Medical Workup Dispo: Anticipated discharge in approximately 3-4 day(s).   Orvis Brill, MD 10/10/2020, 7:51 AM After 5pm on weekdays and 1pm on weekends: On Call pager [phone]

## 2020-10-10 NOTE — Progress Notes (Addendum)
Followed up with [facility] admissions regarding chair time for pt at d/c. Awaiting a return call.   Melven Sartorius Renal Navigator [phone]  Addendum at 2:51 pm: Pt has been set-up at [facility] MWF at 6:00am. Pt will need to go to [facility] the day before he starts to complete paperwork. Will attempt to discuss with pt in the am due to pt receiving HD this afternoon. Will also need to make sure that pt's d/c plan will work with this schedule as well. Will follow and assist.

## 2020-10-10 NOTE — Progress Notes (Signed)
Pt impulsive and impatient. Had to to use the [facility] and climbed out of bed and somehow got part way across room to [facility]. When ready to go back to bed pt given walker and initially he was refusing to use it and didn't want anybody close to him. He thrust his body back and forth and forward while on the [facility] and got to a standing position but leaned to the left and when we grabbed his arm to keep him somewhat balanced he yelled. He sat back down on BSC and did it all over again. He then walked (very unsteady)  about 2-3 steps and lunged forward to the bed. Pt is refusing for anybody to help him because he says he can do it and won't fall. Pt back in bed with bed alarm set

## 2020-10-10 NOTE — Progress Notes (Signed)
39: Lab tech called and stated pt's Calcium this am is 6.4 will let doctor know.

## 2020-10-10 NOTE — Progress Notes (Signed)
Occupational Therapy Treatment Patient Details Name: Christopher Chapman MRN: [id number] DOB: 1955-08-29 Today's Date: 10/10/2020   History of present illness 65 y.o. male admitted after being found down for unknown amount of time.  He was found to have large R sided chronic SDH with 1/7 cm midline shift and mass effect.   He underwent craniotomy for evacuation. Pt to start HD.   PMH includes: Chronic renal failure, ESRD, gout, h/o cocaine abuse, HTN   OT comments  Yohance is progressing towards his goals, seen in conjunction with PT to address cognition, impulsivity and to increase functional ambulation. Pt was extremely tangential throughout the entire session, difficult to re-direct to task. He required min-mod assists for all tasks this session for L extremity weakness, involvement in functional task and direct assist to move LLE when ambulating without AD. Pt continues to benefit from continued OT acutely. D/c recommendation remains appropriate.    Recommendations for follow up therapy are one component of a multi-disciplinary discharge planning process, led by the attending physician.  Recommendations may be updated based on patient status, additional functional criteria and insurance authorization.    Follow Up Recommendations  CIR    Equipment Recommendations  3 in 1 bedside commode    Recommendations for Other Services      Precautions / Restrictions Precautions Precautions: Fall Precaution Comments: fall risk Restrictions Weight Bearing Restrictions: No       Mobility Bed Mobility Overal bed mobility: Needs Assistance Bed Mobility: Supine to Sit;Sit to Supine     Supine to sit: Min assist Sit to supine: Min assist   General bed mobility comments: minA for LE management, pt benefits from verbal cues fro sequencing however does not appricate being given direction    Transfers Overall transfer level: Needs assistance Equipment used: 1 person hand held assist Transfers:  Sit to/from Stand Sit to Stand: Min assist         General transfer comment: Min A for LLE management sit<>stand, mod A for ambulation for LLE    Balance Overall balance assessment: Needs assistance Sitting-balance support: Feet supported Sitting balance-Leahy Scale: Fair Sitting balance - Comments: completed grooming while sitting in the chair unsupported from back rest, no apparent LOB   Standing balance support: Single extremity supported;During functional activity Standing balance-Leahy Scale: Poor Standing balance comment: Pt had LOB during standing task                           ADL either performed or assessed with clinical judgement   ADL Overall ADL's : Needs assistance/impaired     Grooming: Wash/dry hands;Applying deodorant;Oral care;Minimal assistance Grooming Details (indicate cue type and reason): assist to sequence and attend to task                             Functional mobility during ADLs: Moderate assistance General ADL Comments: pt very impulsive, poor safety awareness and insight into his deficits. assistnace througout for verbal or tactile cues, also requires assist for LLE during ambulation and LUE during functional task     Vision   Vision Assessment?: No apparent visual deficits   Perception     Praxis      Cognition Arousal/Alertness: Awake/alert Behavior During Therapy: Impulsive Overall Cognitive Status: Impaired/Different from baseline Area of Impairment: Attention;Memory;Following commands;Safety/judgement;Orientation                 Orientation Level: Disoriented  to;Situation Current Attention Level: Focused Memory: Decreased recall of precautions;Decreased short-term memory Following Commands: Follows one step commands consistently Safety/Judgement: Decreased awareness of safety;Decreased awareness of deficits   Problem Solving: Slow processing General Comments: pt very tangential and off topic at  times. pt educated on time, location and reason for admission. pt able to recall information after 5 minutes. pt unable to apply education on deficits to functional task; pt asked to 1. walk to sink & 2. brush his teeth, he required max verbal cues to sequence task and once at the sink did not remeber step #2. pt LOB without any righting balance strategies. pt lacks awareness to L side of body weakness        Exercises     Shoulder Instructions       General Comments VSS on RA, pt pleasant and cooroperative this session    Pertinent Vitals/ Pain       Pain Assessment: No/denies pain  Home Living                                          Prior Functioning/Environment              Frequency  Min 2X/week        Progress Toward Goals  OT Goals(current goals can now be found in the care plan section)  Progress towards OT goals: Progressing toward goals  Acute Rehab OT Goals Patient Stated Goal: to get up OT Goal Formulation: With patient Time For Goal Achievement: 10/21/20 Potential to Achieve Goals: Good ADL Goals Pt Will Perform Grooming: with min assist;standing Pt Will Perform Upper Body Bathing: with set-up;with supervision;sitting Pt Will Perform Lower Body Bathing: with min assist;sit to/from stand Pt Will Perform Upper Body Dressing: with set-up;with supervision;sitting Pt Will Perform Lower Body Dressing: with min assist;sit to/from stand Pt Will Transfer to Toilet: with min assist;ambulating;regular height toilet;bedside commode;grab bars Pt Will Perform Toileting - Clothing Manipulation and hygiene: with min assist;sit to/from stand Additional ADL Goal #1: Pt will sustain attention to familiar ADL task x 4 mins with min cues Additional ADL Goal #2: Pt will be oriented x 4 with use of external cues and min verbal cues  Plan Discharge plan remains appropriate    Co-evaluation      Reason for Co-Treatment: Complexity of the patient's  impairments (multi-system involvement);To address functional/ADL transfers PT goals addressed during session: Mobility/safety with mobility OT goals addressed during session: ADL's and self-care;Proper use of Adaptive equipment and DME      AM-PAC OT "6 Clicks" Daily Activity     Outcome Measure   Help from another person eating meals?: A Little Help from another person taking care of personal grooming?: A Little Help from another person toileting, which includes using toliet, bedpan, or urinal?: A Lot Help from another person bathing (including washing, rinsing, drying)?: A Lot Help from another person to put on and taking off regular upper body clothing?: A Lot Help from another person to put on and taking off regular lower body clothing?: A Lot 6 Click Score: 14    End of Session Equipment Utilized During Treatment: Gait belt  OT Visit Diagnosis: Cognitive communication deficit (R41.841)   Activity Tolerance Patient tolerated treatment well   Patient Left with chair alarm set;in bed;with bed alarm set   Nurse Communication Mobility status        Time:  3935-9409 OT Time Calculation (min): 33 min  Charges: OT General Charges $OT Visit: 1 Visit OT Treatments $Self Care/Home Management : 8-22 mins    Karianna Gusman A Arpita Fentress 10/10/2020, 12:52 PM

## 2020-10-11 DIAGNOSIS — N184 Chronic kidney disease, stage 4 (severe): Secondary | ICD-10-CM | POA: Diagnosis not present

## 2020-10-11 DIAGNOSIS — R4182 Altered mental status, unspecified: Secondary | ICD-10-CM | POA: Diagnosis not present

## 2020-10-11 LAB — BASIC METABOLIC PANEL
Anion gap: 8 (ref 5–15)
BUN: 42 mg/dL — ABNORMAL HIGH (ref 8–23)
CO2: 27 mmol/L (ref 22–32)
Calcium: 7.2 mg/dL — ABNORMAL LOW (ref 8.9–10.3)
Chloride: 97 mmol/L — ABNORMAL LOW (ref 98–111)
Creatinine, Ser: 5.92 mg/dL — ABNORMAL HIGH (ref 0.61–1.24)
GFR, Estimated: 10 mL/min — ABNORMAL LOW (ref >60)
Glucose, Bld: 131 mg/dL — ABNORMAL HIGH (ref 70–99)
Potassium: 3.7 mmol/L (ref 3.5–5.1)
Sodium: 132 mmol/L — ABNORMAL LOW (ref 135–145)

## 2020-10-11 LAB — CBC
HCT: 25.7 % — ABNORMAL LOW (ref 39.0–52.0)
Hemoglobin: 8.2 g/dL — ABNORMAL LOW (ref 13.0–17.0)
MCH: 27.6 pg (ref 26.0–34.0)
MCHC: 31.9 g/dL (ref 30.0–36.0)
MCV: 86.5 fL (ref 80.0–100.0)
Platelets: 167 10*3/uL (ref 150–400)
RBC: 2.97 MIL/uL — ABNORMAL LOW (ref 4.22–5.81)
RDW: 15.8 % — ABNORMAL HIGH (ref 11.5–15.5)
WBC: 15.1 10*3/uL — ABNORMAL HIGH (ref 4.0–10.5)
nRBC: 0.3 % — ABNORMAL HIGH (ref 0.0–0.2)

## 2020-10-11 LAB — GLUCOSE, CAPILLARY
Glucose-Capillary: 167 mg/dL — ABNORMAL HIGH (ref 70–99)
Glucose-Capillary: 123 mg/dL — ABNORMAL HIGH (ref 70–99)
Glucose-Capillary: 124 mg/dL — ABNORMAL HIGH (ref 70–99)
Glucose-Capillary: 103 mg/dL — ABNORMAL HIGH (ref 70–99)
Glucose-Capillary: 194 mg/dL — ABNORMAL HIGH (ref 70–99)

## 2020-10-11 NOTE — Progress Notes (Signed)
Met with pt at bedside this am. Discussed out-pt HD arrangements and provided pt with information sheet as well with information noted. Pt will go to [facility] on MWF. Pt will need to arrive at 5:40 for a 6:00 chair time. Pt will need to go to the HD clinic the day before he starts to complete required paperwork prior to his first treatment. Pt agreeable to arrangements but states he will need assistance with transportation to/from HD. Pt states he used a transportation program when he was on HD in the past. Offered to contact pt's family to discuss HD arrangements but pt declined. Alerted MD and CSW to the above info. Will follow and assist.   Melven Sartorius  Renal Navigator [phone]

## 2020-10-11 NOTE — Discharge Instructions (Addendum)
You were admitted to the hospital for a bleed around your brain.  You had surgery to get this out. You will receive inpatient rehabilitation before discharge. You will need to go to your new hemodialysis facility the day before your next session to get all the paperwork done.  Please take all of these medications as prescribed.  You will need to continue Keppra through October 24th. You will need to follow-up with Neurosurgery in 2 weeks- they will arrange this. Follow-up with your primary care physician in the next few days after discharge to discuss your recent hospitalization here.

## 2020-10-11 NOTE — Progress Notes (Signed)
Physical Therapy Treatment Patient Details Name: Christopher Chapman MRN: [id number] DOB: 1955/11/26 Today's Date: 10/11/2020   History of Present Illness 65 y.o. male admitted after being found down for unknown amount of time. He was found to have large R sided chronic SDH with 1/7 cm midline shift and mass effect. He underwent craniotomy for evacuation. Pt to start HD.  PMH includes: Chronic renal failure, ESRD, gout, h/o cocaine abuse, HTN.    PT Comments    Pt received in supine, agreeable to therapy session and with good participation and fair tolerance for transfer training, pre-gait tasks and seated LE exercises. Pt with continued decreased L awareness and poor insight into deficits and with some rapid mood shifts but participatory with encouragement and joking at times with good recall of therapist name. Pt self-directed throughout and does not enjoy therapist cues for safety with transfers and safer technique when performing pre-gait tasks. Recommend +2 assist next session for safe gait progression (not available at time of session today). Pt continues to benefit from PT services to progress toward functional mobility goals.   Recommendations for follow up therapy are one component of a multi-disciplinary discharge planning process, led by the attending physician.  Recommendations may be updated based on patient status, additional functional criteria and insurance authorization.  Follow Up Recommendations  CIR     Equipment Recommendations  Other (comment) (TBD)    Recommendations for Other Services Rehab consult     Precautions / Restrictions Precautions Precautions: Fall Precaution Comments: fall risk, poor insight into deficits, decreased L attention/awareness and L hemi Restrictions Weight Bearing Restrictions: No     Mobility  Bed Mobility Overal bed mobility: Needs Assistance Bed Mobility: Supine to Sit     Supine to sit: Min assist     General bed mobility  comments: minA for LE management, pt benefits from verbal cues for sequencing and increased time to perform.    Transfers Overall transfer level: Needs assistance Equipment used: Hemi-walker Transfers: Sit to/from [facility] Sit to Stand: Min assist Stand pivot transfers: Mod assist       General transfer comment: Min A for LLE management sit<>stand, verbal cues for L foot placement although pt ignoring cues then requesting therapist not remind him to. Pt unsafely sitting prior to reaching proximity to chair while pivoting, needing modA to guide safely to sitting. Pt did well with hemi RW for RUE support while pivoting.  Ambulation/Gait             General Gait Details: defer for safety, pt with poor receptiveness to sequencing/safety with pivotal steps to chair and not allowing therapist to assist with LLE mgmt much this date but also not able to weight shift onto LLE/keeping it extended out so defer gait progression for safety (lack of +2 assist)   Stairs             Wheelchair Mobility    Modified Rankin (Stroke Patients Only) Modified Rankin (Stroke Patients Only) Pre-Morbid Rankin Score: No significant disability Modified Rankin: Severe disability     Balance Overall balance assessment: Needs assistance Sitting-balance support: Feet supported Sitting balance-Leahy Scale: Fair Sitting balance - Comments: static sitting and weight shifting at EOB with U UE support, R lean at times with close Supervision but no backward LOB Postural control: Right lateral lean Standing balance support: Single extremity supported;During functional activity Standing balance-Leahy Scale: Poor Standing balance comment: Pt unsteady in stance and unable to put 50% of weight on LLE, minA  for static standing briefly with HW support but needs mod/maxA for any dynamic standing tasks this date                            Cognition Arousal/Alertness:  Awake/alert Behavior During Therapy: Impulsive Overall Cognitive Status: Impaired/Different from baseline Area of Impairment: Attention;Memory;Following commands;Safety/judgement;Orientation                 Orientation Level: Disoriented to;Situation (unsure how "I broke my head open") Current Attention Level: Focused Memory: Decreased recall of precautions;Decreased short-term memory Following Commands: Follows one step commands inconsistently (easily distracted and becomes irritated when given directions or are repeated although pt very unsafe at times) Safety/Judgement: Decreased awareness of safety;Decreased awareness of deficits Awareness: Intellectual Problem Solving: Slow processing;Difficulty sequencing;Requires verbal cues General Comments: Pt with good recall of therapist name even when entering room ~1hr later to grab cleaning wipes from room. Pt unable to apply education on deficits to functional task and poor insight into deficits; Pt lacks awareness to L side of body weakness. pt easily frustrated and irritated with verbal cues stating "I don't need so much instruction" although he attempted to sit prior to reaching chair surface. Attempted to educate pt on L hemi paresis due to [facility] however pt not comprehending but will look to L side briefly when cued.      Exercises Other Exercises Other Exercises: seated BLE AROM: ankle pumps, LAQ (AA on LLE), hip flexion (on RLE only) x10 reps Other Exercises: reciprocal STS x5 reps with HW, pt unable to reach full upright posture (more of a squat each rep) 2/2 cognition vs fatigue    General Comments General comments (skin integrity, edema, etc.): HR 80's bpm post-exertional      Pertinent Vitals/Pain Pain Assessment: No/denies pain    Home Living                      Prior Function            PT Goals (current goals can now be found in the care plan section) Acute Rehab PT Goals Patient Stated Goal: to get up PT  Goal Formulation: Patient unable to participate in goal setting Time For Goal Achievement: 10/21/20 Potential to Achieve Goals: Fair Progress towards PT goals: Progressing toward goals    Frequency    Min 4X/week      PT Plan Current plan remains appropriate    Co-evaluation              AM-PAC PT "6 Clicks" Mobility   Outcome Measure  Help needed turning from your back to your side while in a flat bed without using bedrails?: A Lot Help needed moving from lying on your back to sitting on the side of a flat bed without using bedrails?: A Lot Help needed moving to and from a bed to a chair (including a wheelchair)?: A Lot Help needed standing up from a chair using your arms (e.g., wheelchair or bedside chair)?: A Lot Help needed to walk in hospital room?: Total Help needed climbing 3-5 steps with a railing? : Total 6 Click Score: 10    End of Session Equipment Utilized During Treatment: Gait belt Activity Tolerance: Patient tolerated treatment well Patient left: with call bell/phone within reach;in chair;with chair alarm set (chair alarm on, pt reminded of use of call bell and reports he will call RN and later able to report back use of call  bell when therapist asked him again) Nurse Communication: Mobility status;Need for lift equipment (will probably be safer with Stedy and +2 to return pivot but if not using Stedy, turn chair so he goes toward R side.) PT Visit Diagnosis: Unsteadiness on feet (R26.81);Muscle weakness (generalized) (M62.81);Other abnormalities of gait and mobility (R26.89);Difficulty in walking, not elsewhere classified (R26.2);Other symptoms and signs involving the nervous system (R29.898);Hemiplegia and hemiparesis Hemiplegia - Right/Left: Left Hemiplegia - dominant/non-dominant: Non-dominant Hemiplegia - caused by:  (SDH)     Time: 3354-5625 PT Time Calculation (min) (ACUTE ONLY): 20 min  Charges:  $Therapeutic Exercise: 8-22 mins                      Elijahjames Fuelling P., PTA Acute Rehabilitation Services Pager: [phone] Office: [place] 10/11/2020, 6:54 PM

## 2020-10-11 NOTE — Progress Notes (Signed)
Inpatient Rehab Admissions Coordinator:   I have no beds available for this patient to admit to CIR today.  Will continue to follow for timing of potential admission pending bed availability.   Shann Medal, PT, DPT Admissions Coordinator [phone] 10/11/20  10:54 AM

## 2020-10-11 NOTE — Progress Notes (Signed)
Subjective:   O/N Events: None  Patient was seen this morning during bedside rounds.  He does not endorse any specific complaints or concerns at this time.   Objective:  Vital signs in last 24 hours: Vitals:   10/10/20 2352 10/11/20 0330 10/11/20 0850 10/11/20 1120  BP: (!) 159/89 (!) 143/86 (!) 144/77 134/72  Pulse: 78 78 73 69  Resp: 18 18 19    Temp:  98.9 F (37.2 C) 97.9 F (36.6 C) 99.1 F (37.3 C)  TempSrc:  Oral Oral Oral  SpO2: 96% 94% 100% 100%  Weight:      Height:       CBC Latest Ref Rng & Units 10/11/2020 10/10/2020 10/09/2020  WBC 4.0 - 10.5 K/uL 15.1(H) 10.9(H) 11.0(H)  Hemoglobin 13.0 - 17.0 g/dL 8.2(L) 8.0(L) 8.1(L)  Hematocrit 39.0 - 52.0 % 25.7(L) 25.5(L) 24.8(L)  Platelets 150 - 400 K/uL 167 157 136(L)    BMP Latest Ref Rng & Units 10/11/2020 10/10/2020 10/09/2020  Glucose 70 - 99 mg/dL 131(H) 166(H) 154(H)  BUN 8 - 23 mg/dL 42(H) 73(H) 53(H)  Creatinine 0.61 - 1.24 mg/dL 5.92(H) 9.47(H) 8.11(H)  BUN/Creat Ratio 10 - 24 - - -  Sodium 135 - 145 mmol/L 132(L) 132(L) 134(L)  Potassium 3.5 - 5.1 mmol/L 3.7 4.4 4.0  Chloride 98 - 111 mmol/L 97(L) 97(L) 99  CO2 22 - 32 mmol/L 27 21(L) 24  Calcium 8.9 - 10.3 mg/dL 7.2(L) 6.4(LL) 6.9(L)     Physical Exam Constitutional:      General: He is not in acute distress.    Appearance: Normal appearance. He is not ill-appearing, toxic-appearing or diaphoretic.     Comments: Resting comfortably in bed, answers questions appropriately, no acute distress  HENT:     Head:     Comments: R surgical site shows no erythema, drainage, or purulence. Staples intact.  Cardiovascular:     Rate and Rhythm: Normal rate and regular rhythm.     Pulses: Normal pulses.     Heart sounds: Normal heart sounds. No murmur heard.   No friction rub. No gallop.  Pulmonary:     Effort: Pulmonary effort is normal.     Breath sounds: Normal breath sounds. No wheezing, rhonchi or rales.  Musculoskeletal:     Right lower leg: No edema.      Left lower leg: No edema.     Comments:    Skin:    General: Skin is warm and dry.  Neurological:     General: No focal deficit present.     Mental Status: He is alert and oriented to person, place, and time.  Psychiatric:        Mood and Affect: Mood normal.        Behavior: Behavior normal.     Assessment/Plan:  Active Problems:   Subdural hematoma   Altered mental status   ABLA (acute blood loss anemia)   Stage 4 chronic kidney disease (HCC)   S/P craniotomy  Pearlean Brownie is a 65 y/o M with a PMHx HTN, ETOH Abuse, cocaine abuse, DM, and ESRD w/kidney transplant, who presented for AMS and was found to have a SDH and admitted for Craniotomy and evacuation, medically stable for discharge, awaiting CIR placement.  Subacute R SDH now S/P R Craniotomy and Evacuation:  Craniotomy and evacuation on 10/05/2020. Mentating well at bedside, fully conversant, and alert and oriented x3. Appears to be a candidate for CIR placement. - Appreciate neurosurgery recommendations - Continue Frequent neuro  checks - Continue keppra 250 mg BID  Hx CKD S/P Renal Transplant  w/ Progressive ESRD Being followed by Nephrology, receiving iHD, CLIP process underway - Continue mycophenolate and tacro per nephrology - Continue BP control  - HD per nephrology today - Trend BMP   HTN:   - Amlodipine 5 mg daily - Labetolol 300 mg TID - PRN hydralazine   Leukocytosis, likely reactive Staph epi on 1/2 plates, likely contaminate, leukocytosis continues to downtrend. - Trend CBC   DM:  - Continue SSI   Acute Blood Loss Anemia Platelet Dysfunction in the Setting of Uremia:  Patient s/p 1 PRBC 9/28  - Trend CBC - Transfuse for Hgb < 7    Hx Polysubstance Abuse:  - Will need substance abuse counseling   Prior to Admission Living Arrangement: Home Anticipated Discharge Location: Appropriate candidate for CIR Barriers to Discharge: Continued Medical Workup Dispo: Anticipated discharge in  approximately 2-3 day(s).   Orvis Brill, MD 10/11/2020, 1:39 PM After 5pm on weekdays and 1pm on weekends: On Call pager [phone]

## 2020-10-11 NOTE — Progress Notes (Signed)
This RN placed pt on the bed side commode after he stated he needed to have a bowel movement. Received a call that required me to step into another room. Gave pt the call bell and asked to pt to call me when he was finished. pt agreed. told pt i would be right back after i checked my other alarm. not even three minutes later NT called and said pt was on the floor. pt stated that he attempted to stand but he was feeling weak so he lowered himself onto the floor. pt assessed no injuries.

## 2020-10-11 NOTE — Progress Notes (Signed)
CM met with the patient to provide other options for inpatient rehab as [facility] has limited beds the next several days. Pt unsure about making a "big" decision at this time. He is going to think about it and update CM tomorrow of his decision. CM asked if about his brother assisting with these decisions and pt stated "no".  CM will f/u with him tomorrow.

## 2020-10-11 NOTE — Progress Notes (Signed)
Nephrology Follow-Up Consult note   Assessment/Recommendations: Christopher Chapman is a/an 65 y.o. 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 Creatinine was already in high 5's range in 03/17/20 and he was counseled that he was nearing ESRD again but missed his f/u appt 2 mths later. He is almost certainly ESRD again now; h/o noncompliance and currently with poor social support thus may not be a candidate for another transplant. -Plan for dialysis tomorrow and maintain MWF schedule - CLIP process (renal navigator notified) --> [facility] MWF 6am set up, needs to go day prior for paperwork - Dose medications for ESRD and avoid nephrotoxic agents as he still has a little residual renal function. - Cut Tac in half to 2mg  BID but overall continue immunosuppressives for now until everything is sorted out but appears that he may not have the social support for another transplant. Therefore alloimmunization may be less of a concern.  Will wean off in outpt setting.    Renal osteodystrophy -phosphorus 8.8. Started sevelamer. PTH 440. Calcium low. 3 Ca bath and calcitriol 0.5 MWF for now. Anemia of CKD: Iron saturation 30 with ferritin of 712.  Hemoglobin 8s. Started aranesp 57mcg with dialysis 10/3. HTN - BP now improved. Watch with HD. SDH - s/p evacuation 9/28. H/o polysubstance abuse  Dispo: looks like he's going to CIR next.  Has outpt HD set up MWF when he discharges.   [facility] 10/11/2020 8:11 AM  ___________________________________________________________  CC: ESRD  Interval History/Subjective: Patient continues to feel well without any complaints.  Says no issues  with HD yesterday.    Medications:  Current Facility-Administered Medications  Medication Dose Route Frequency Provider Last Rate Last Admin   0.9 %  sodium chloride infusion  100 mL Intravenous PRN Erick Colace, NP       0.9 %  sodium chloride infusion  100 mL Intravenous PRN Erick Colace, NP       acetaminophen (TYLENOL) tablet 650 mg  650 mg Oral Q4H PRN Erick Colace, NP       Or   acetaminophen (TYLENOL) suppository 650 mg  650 mg Rectal Q4H PRN Erick Colace, NP       allopurinol (ZYLOPRIM) tablet 200 mg  200 mg Oral Daily Salvadore Dom E, NP   200 mg at 10/10/20 1025   alteplase (CATHFLO ACTIVASE) injection 2 mg  2 mg Intracatheter Once PRN Erick Colace, NP       amLODipine (NORVASC) tablet 5 mg  5 mg Oral Daily Erick Colace, NP   5 mg at 10/10/20 1025   calcitRIOL (ROCALTROL) capsule 0.5 mcg  0.5 mcg Oral Q M,W,F-HD Reesa Chew, MD   0.5 mcg at 10/10/20 1846   Chlorhexidine Gluconate Cloth 2 % PADS 6 each  6 each Topical Daily Erick Colace, NP   6 each at 10/10/20 1029   Darbepoetin Alfa (ARANESP) injection 60 mcg  60 mcg Intravenous Q Mon-HD Reesa Chew, MD   60 mcg at 10/10/20 1831   docusate sodium (COLACE) capsule 100 mg  100 mg Oral BID PRN Erick Colace, NP       hydrALAZINE (APRESOLINE) injection 10 mg  10 mg Intravenous Q4H PRN Maudie Mercury, MD       HYDROcodone-acetaminophen (NORCO/VICODIN) 5-325 MG per tablet 1 tablet  1 tablet Oral Q4H PRN Erick Colace, NP   1 tablet at 10/07/20 2030   influenza vac split quadrivalent PF (FLUARIX) injection 0.5 mL  0.5 mL Intramuscular Tomorrow-1000 Joni Reining C, DO       insulin aspart (novoLOG) injection 0-15 Units  0-15 Units Subcutaneous TID WC Erick Colace, NP   3 Units at 10/11/20 0626   labetalol (NORMODYNE) tablet 300 mg  300 mg Oral TID Erick Colace, NP   300 mg at 10/10/20 2239   levETIRAcetam (KEPPRA) tablet 250 mg  250 mg Oral BID [name], MD   250 mg at 10/10/20  2238   lidocaine (PF) (XYLOCAINE) 1 % injection 5 mL  5 mL Intradermal PRN Erick Colace, NP       lidocaine-prilocaine (EMLA) cream 1 application  1 application Topical PRN Erick Colace, NP       losartan (COZAAR) tablet 25 mg  25 mg Oral Daily Reesa Chew, MD   25 mg at 10/10/20 1025   mycophenolate (MYFORTIC) EC tablet 360 mg  360 mg Oral BID Erick Colace, NP   360 mg at 10/10/20 2240   ondansetron (ZOFRAN) tablet 4 mg  4 mg Oral Q4H PRN Erick Colace, NP       Or   ondansetron (ZOFRAN) injection 4 mg  4 mg Intravenous Q4H PRN Erick Colace, NP   4 mg at 10/06/20 1212   pantoprazole (PROTONIX) EC tablet 40 mg  40 mg Oral QHS Reome, Earle J, RPH   40 mg at 10/10/20 2239   pentafluoroprop-tetrafluoroeth (GEBAUERS) aerosol 1 application  1 application Topical PRN Erick Colace, NP       pneumococcal 23 valent vaccine (PNEUMOVAX-23) injection 0.5 mL  0.5 mL Intramuscular Tomorrow-1000 Lucious Groves, DO       senna (SENOKOT) tablet 8.6 mg  1 tablet Oral BID Erick Colace, NP   8.6 mg at 10/10/20 2239   sevelamer carbonate (RENVELA) tablet 800 mg  800 mg Oral TID WC Reesa Chew, MD   800 mg at 10/10/20 1146   tacrolimus (PROGRAF) capsule 2 mg  2 mg Oral BID Reesa Chew, MD   2 mg at 10/10/20 2240      Review of Systems: 10 systems reviewed and negative except per interval history/subjective  Physical Exam: Vitals:   10/10/20 2352 10/11/20 0330  BP: (!) 159/89 (!) 143/86  Pulse: 78 78  Resp: 18 18  Temp:  98.9 F (37.2 C)  SpO2: 96% 94%   No intake/output data recorded.  Intake/Output Summary (Last 24 hours) at 10/11/2020 0811 Last data filed at 10/10/2020 1824 Gross per 24 hour  Intake 600 ml  Output 1543 ml  Net -943 ml    Constitutional: Lying in bed, no acute distress  ENMT: MMM CV: normal rate Respiratory: Bilateral chest rise, normal work of breathing with a few scattered rhonchi, no rales Gastrointestinal: soft, non-tender Skin:  no visible lesions or rashes Psych: alert, calm Dialysis access: LUE AVF +T/b Extr: no edema   Test Results I personally reviewed new and old clinical labs and radiology tests Lab Results  Component Value Date   NA 132 (L) 10/11/2020   K 3.7 10/11/2020   CL 97 (L) 10/11/2020   CO2 27 10/11/2020   BUN 42 (H)  10/11/2020   CREATININE 5.92 (H) 10/11/2020   CALCIUM 7.2 (L) 10/11/2020   ALBUMIN 2.6 (L) 10/07/2020   PHOS 8.8 (H) 10/06/2020

## 2020-10-12 ENCOUNTER — Other Ambulatory Visit: Payer: Self-pay | Admitting: Neurological Surgery

## 2020-10-12 ENCOUNTER — Inpatient Hospital Stay (HOSPITAL_COMMUNITY): Payer: Medicaid Other

## 2020-10-12 LAB — BASIC METABOLIC PANEL
Anion gap: 9 (ref 5–15)
BUN: 53 mg/dL — ABNORMAL HIGH (ref 8–23)
CO2: 25 mmol/L (ref 22–32)
Calcium: 6.3 mg/dL — CL (ref 8.9–10.3)
Chloride: 97 mmol/L — ABNORMAL LOW (ref 98–111)
Creatinine, Ser: 7.58 mg/dL — ABNORMAL HIGH (ref 0.61–1.24)
GFR, Estimated: 7 mL/min — ABNORMAL LOW (ref >60)
Glucose, Bld: 117 mg/dL — ABNORMAL HIGH (ref 70–99)
Potassium: 3.5 mmol/L (ref 3.5–5.1)
Sodium: 131 mmol/L — ABNORMAL LOW (ref 135–145)

## 2020-10-12 LAB — GLUCOSE, CAPILLARY
Glucose-Capillary: 140 mg/dL — ABNORMAL HIGH (ref 70–99)
Glucose-Capillary: 114 mg/dL — ABNORMAL HIGH (ref 70–99)
Glucose-Capillary: 129 mg/dL — ABNORMAL HIGH (ref 70–99)
Glucose-Capillary: 154 mg/dL — ABNORMAL HIGH (ref 70–99)

## 2020-10-12 LAB — CBC
HCT: 26 % — ABNORMAL LOW (ref 39.0–52.0)
Hemoglobin: 8.2 g/dL — ABNORMAL LOW (ref 13.0–17.0)
MCH: 27.4 pg (ref 26.0–34.0)
MCHC: 31.5 g/dL (ref 30.0–36.0)
MCV: 87 fL (ref 80.0–100.0)
Platelets: 170 10*3/uL (ref 150–400)
RBC: 2.99 MIL/uL — ABNORMAL LOW (ref 4.22–5.81)
RDW: 15.8 % — ABNORMAL HIGH (ref 11.5–15.5)
WBC: 11.5 10*3/uL — ABNORMAL HIGH (ref 4.0–10.5)
nRBC: 0.3 % — ABNORMAL HIGH (ref 0.0–0.2)

## 2020-10-12 LAB — TACROLIMUS LEVEL: Tacrolimus (FK506) - LabCorp: <0.5 ng/mL — ABNORMAL LOW (ref 2.0–20.0)

## 2020-10-12 NOTE — Progress Notes (Signed)
He looks pretty good today.  He denies headache.  His incision looks good.  He is awake and alert and interactive.  He is moving all extremities strongly.  We have ordered a follow-up head CT for today.  Continue Keppra for another week.  Awaiting CIR.  Continue PT OT.

## 2020-10-12 NOTE — Progress Notes (Signed)
I have reviewed his follow-up CT scan done today, and he has had a reaccumulation of hypodense fluid collection consistent with recurrent chronic subdural hematoma.  This is not uncommon.  It is fairly large with a fair amount of mass-effect.  He looks good clinically.  I still recommend redo craniotomy for evacuation of the recurrent chronic subdural hematoma.  We will plan to do this on Friday when we have OR availability unless he has some change in neurologic status, at which time we would do it sooner.Christopher Chapman

## 2020-10-12 NOTE — Plan of Care (Signed)
Pt is alert oriented x 4. Pt is tired. Took medications with out complication. Pt had dialysis today. Pt had 75% of supper tonight. Bed alarm on. Encouraged to call for assistance if needed.   Problem: Education: Goal: Knowledge of General Education information will improve Description: Including pain rating scale, medication(s)/side effects and non-pharmacologic comfort measures Outcome: Progressing   Problem: Health Behavior/Discharge Planning: Goal: Ability to manage health-related needs will improve Outcome: Progressing   Problem: Clinical Measurements: Goal: Ability to maintain clinical measurements within normal limits will improve Outcome: Progressing Goal: Will remain free from infection Outcome: Progressing Goal: Diagnostic test results will improve Outcome: Progressing Goal: Respiratory complications will improve Outcome: Progressing Goal: Cardiovascular complication will be avoided Outcome: Progressing   Problem: Activity: Goal: Risk for activity intolerance will decrease Outcome: Progressing   Problem: Nutrition: Goal: Adequate nutrition will be maintained Outcome: Progressing   Problem: Coping: Goal: Level of anxiety will decrease Outcome: Progressing   Problem: Elimination: Goal: Will not experience complications related to bowel motility Outcome: Progressing Goal: Will not experience complications related to urinary retention Outcome: Progressing   Problem: Pain Managment: Goal: General experience of comfort will improve Outcome: Progressing   Problem: Safety: Goal: Ability to remain free from injury will improve Outcome: Progressing   Problem: Skin Integrity: Goal: Risk for impaired skin integrity will decrease Outcome: Progressing

## 2020-10-12 NOTE — Progress Notes (Signed)
Subjective:   O/N Events: None  Patient was seen this morning during bedside rounds.  He does not endorse any specific complaints or concerns at this time.  Patient says that he does not understand why people are having more difficulty understanding him.  Patient seemed a bit more confused to [place] when we saw him today, but he was oriented to self, current location, and year.  Objective:  Vital signs in last 24 hours: Vitals:   10/11/20 1753 10/11/20 2013 10/12/20 0337 10/12/20 0500  BP: 123/71 (!) 113/59 137/78   Pulse: 70 68 71   Resp: 18 19 17    Temp: 98.1 F (36.7 C) 98.1 F (36.7 C) 98.1 F (36.7 C)   TempSrc: Oral Oral Oral   SpO2: 100% 93% 99%   Weight:    101.2 kg  Height:       CBC Latest Ref Rng & Units 10/12/2020 10/11/2020 10/10/2020  WBC 4.0 - 10.5 K/uL 11.5(H) 15.1(H) 10.9(H)  Hemoglobin 13.0 - 17.0 g/dL 8.2(L) 8.2(L) 8.0(L)  Hematocrit 39.0 - 52.0 % 26.0(L) 25.7(L) 25.5(L)  Platelets 150 - 400 K/uL 170 167 157    BMP Latest Ref Rng & Units 10/12/2020 10/11/2020 10/10/2020  Glucose 70 - 99 mg/dL 117(H) 131(H) 166(H)  BUN 8 - 23 mg/dL 53(H) 42(H) 73(H)  Creatinine 0.61 - 1.24 mg/dL 7.58(H) 5.92(H) 9.47(H)  BUN/Creat Ratio 10 - 24 - - -  Sodium 135 - 145 mmol/L 131(L) 132(L) 132(L)  Potassium 3.5 - 5.1 mmol/L 3.5 3.7 4.4  Chloride 98 - 111 mmol/L 97(L) 97(L) 97(L)  CO2 22 - 32 mmol/L 25 27 21(L)  Calcium 8.9 - 10.3 mg/dL 6.3(LL) 7.2(L) 6.4(LL)     Physical Exam Constitutional:      General: He is not in acute distress.    Appearance: Normal appearance. He is not ill-appearing, toxic-appearing or diaphoretic.     Comments: Resting comfortably in bed, answers questions appropriately, no acute distress  HENT:     Head:     Comments: R surgical site shows no erythema, drainage, or purulence. Staples intact.  Cardiovascular:     Rate and Rhythm: Normal rate and regular rhythm.     Pulses: Normal pulses.     Heart sounds: Normal heart sounds. No murmur heard.    No friction rub. No gallop.  Pulmonary:     Effort: Pulmonary effort is normal.     Breath sounds: Normal breath sounds. No wheezing, rhonchi or rales.  Musculoskeletal:     Right lower leg: No edema.     Left lower leg: No edema.     Comments:    Skin:    General: Skin is warm and dry.  Neurological:     General: No focal deficit present.     Mental Status: He is alert and oriented to person, place, and time.  Psychiatric:        Mood and Affect: Mood normal.        Behavior: Behavior normal.     Assessment/Plan:  Active Problems:   Subdural hematoma   Altered mental status   ABLA (acute blood loss anemia)   Stage 4 chronic kidney disease (HCC)   S/P craniotomy  Christopher Chapman is a 65 y/o M with a PMHx HTN, ETOH Abuse, cocaine abuse, DM, and ESRD w/kidney transplant, who presented for AMS and was found to have a SDH and admitted for Craniotomy and evacuation.   Subacute R SDH now S/P R Craniotomy and Evacuation:  Craniotomy and evacuation on 10/05/2020.  Appears well clinically, however follow-up CT scan shows reaccumulation of hypodense fluid collection consistent with recurrent chronic subdural hematoma. Neurosurgery plans for repeat craniotomy for evacuation of the hematoma, tentative plan for Friday. Otherwise, appears to be a candidate for CIR. - Appreciate neurosurgery recommendations - Continue Frequent neuro checks - Continue keppra 250 mg BID for the next 7 days per neurosurgery  Hx CKD S/P Renal Transplant  w/ Progressive ESRD Being followed by Nephrology, receiving iHD M/W/F, CLIP process underway - Continue mycophenolate and tacro per nephrology - Continue BP control  - HD per nephrology today - Trend BMP   HTN:   - Amlodipine 5 mg daily - Labetolol 300 mg TID - PRN hydralazine   Leukocytosis, likely reactive Staph epi on 1/2 plates, likely contaminate, leukocytosis continues to downtrend. - Trend CBC   DM:  - Continue SSI   Acute Blood Loss  Anemia Platelet Dysfunction in the Setting of Uremia:  Patient s/p 1 PRBC 9/28  - Trend CBC - Transfuse for Hgb < 7    Hx Polysubstance Abuse:  - Will need substance abuse counseling   Prior to Admission Living Arrangement: Home Anticipated Discharge Location: Appropriate candidate for CIR Barriers to Discharge: Repeat craniotomy by NSG, tentative plan for Friday Dispo: Anticipated discharge pending surgery and other medical workup  Orvis Brill, MD 10/12/2020, 11:13 AM After 5pm on weekdays and 1pm on weekends: On Call pager [phone]

## 2020-10-12 NOTE — Progress Notes (Signed)
Pt at HD

## 2020-10-12 NOTE — Progress Notes (Signed)
Pt returned from HD, VSS, pt a&ox4. CBG 129. Dinner tray ordered so 1700 insulin and renvela rescheduled for 1930.

## 2020-10-12 NOTE — Progress Notes (Addendum)
PT Cancellation Note  Patient Details Name: JARREL KNOKE MRN: [id number] DOB: [birth year]   Cancelled Treatment:    Reason Eval/Treat Not Completed: (P) Patient at procedure or test/unavailable (pt at CT Imaging dept.) at 12:30. Re-attempt 1:45pm, pt off floor at HD dept. Will continue efforts next date per PT plan of care as schedule permits.   Kara Pacer Khrystyne Arpin 10/12/2020, 12:33 PM

## 2020-10-12 NOTE — Progress Notes (Signed)
Inpatient Rehab Admissions Coordinator:   I have no beds available for this patient to admit to CIR today.  Will continue to follow for timing of potential admission pending bed availability.   Shann Medal, PT, DPT Admissions Coordinator [phone] 10/12/20  8:44 AM

## 2020-10-12 NOTE — Progress Notes (Signed)
Occupational Therapy Treatment Patient Details Name: Christopher Chapman MRN: [id number] DOB: 1955-06-22 Today's Date: 10/12/2020   History of present illness 65 y.o. male admitted after being found down for unknown amount of time. He was found to have large R sided chronic SDH with 1/7 cm midline shift and mass effect. He underwent craniotomy for evacuation. Pt to start HD.  PMH includes: Chronic renal failure, ESRD, gout, h/o cocaine abuse, HTN.   OT comments  Patient with fair progress toward patient focused goals.  Deficits impacting independence remain.  Patient needing up to Mod A for bed mobility, max A for squat pivot to commode, and Max A for thoroughness s/p BM.  OT to follow in the acute setting, but OT continues to recommend CIR for post acute rehab prior to returning home.  The patient would benefit from an aggressive multi disciplined rehab approach to maximize his functional status.     Recommendations for follow up therapy are one component of a multi-disciplinary discharge planning process, led by the attending physician.  Recommendations may be updated based on patient status, additional functional criteria and insurance authorization.    Follow Up Recommendations  CIR    Equipment Recommendations  3 in 1 bedside commode;Wheelchair (measurements OT);Wheelchair cushion (measurements OT);Tub/shower bench    Recommendations for Other Services      Precautions / Restrictions Precautions Precautions: Fall Precaution Comments: fall risk, poor insight into deficits, decreased L attention/awareness and L hemi Restrictions Weight Bearing Restrictions: No       Mobility Bed Mobility Overal bed mobility: Needs Assistance Bed Mobility: Supine to Sit     Supine to sit: Min assist Sit to supine: Mod assist   General bed mobility comments: assist for advancement of legs.  Safety    Transfers Overall transfer level: Needs assistance   Transfers: Sit to/from Stand;Stand  Pivot Transfers Sit to Stand: Min assist Stand pivot transfers: Mod assist            Balance Overall balance assessment: Needs assistance Sitting-balance support: Feet supported Sitting balance-Leahy Scale: Fair     Standing balance support: Single extremity supported;During functional activity Standing balance-Leahy Scale: Poor                             ADL either performed or assessed with clinical judgement   ADL                       Lower Body Dressing: Moderate assistance;Sitting/lateral leans   Toilet Transfer: Maximal assistance;Squat-pivot;BSC   Toileting- Clothing Manipulation and Hygiene: Sit to/from stand;Maximal assistance         General ADL Comments: pt very impulsive, poor safety awareness and insight into his deficits. verbal or tactile cues throughout, also requires assist for LLE during ambulation and LUE during functional task                       Cognition Arousal/Alertness: Awake/alert Behavior During Therapy: Impulsive Overall Cognitive Status: Impaired/Different from baseline                       Memory: Decreased recall of precautions;Decreased short-term memory Following Commands: Follows one step commands inconsistently Safety/Judgement: Decreased awareness of safety;Decreased awareness of deficits Awareness: Intellectual Problem Solving: Slow processing;Difficulty sequencing;Requires verbal cues  Pertinent Vitals/ Pain       Pain Assessment: No/denies pain Pain Intervention(s): Monitored during session                                                          Frequency  Min 2X/week        Progress Toward Goals  OT Goals(current goals can now be found in the care plan section)  Progress towards OT goals: Progressing toward goals  Acute Rehab OT Goals Patient Stated Goal: Enjoy the simple things in life OT Goal Formulation:  With patient Time For Goal Achievement: 10/21/20 Potential to Achieve Goals: Good  Plan Discharge plan remains appropriate    Co-evaluation                 AM-PAC OT "6 Clicks" Daily Activity     Outcome Measure   Help from another person eating meals?: A Little Help from another person taking care of personal grooming?: A Little Help from another person toileting, which includes using toliet, bedpan, or urinal?: A Lot Help from another person bathing (including washing, rinsing, drying)?: A Lot Help from another person to put on and taking off regular upper body clothing?: A Lot Help from another person to put on and taking off regular lower body clothing?: A Lot 6 Click Score: 14    End of Session Equipment Utilized During Treatment: Gait belt  OT Visit Diagnosis: Cognitive communication deficit (R41.841);Unsteadiness on feet (R26.81);Muscle weakness (generalized) (M62.81);Hemiplegia and hemiparesis Symptoms and signs involving cognitive functions: Cerebral infarction Hemiplegia - Right/Left: Left Hemiplegia - dominant/non-dominant: Non-Dominant   Activity Tolerance Patient tolerated treatment well   Patient Left in bed;with call bell/phone within reach;with bed alarm set   Nurse Communication          Time: 0955-1010 OT Time Calculation (min): 15 min  Charges: OT General Charges $OT Visit: 1 Visit OT Treatments $Self Care/Home Management : 8-22 mins  10/12/2020  RP, OTR/L  Acute Rehabilitation Services  Office:  [phone]   Metta Clines 10/12/2020, 11:35 AM

## 2020-10-12 NOTE — TOC Progression Note (Signed)
Transition of Care [facility]) - Progression Note    Patient Details  Name: Christopher Chapman MRN: [id number] Date of Birth: November 24, 1955  Transition [facility] CM/SW Contact  Pollie Friar, RN Phone Number: 10/12/2020, 12:58 PM  Clinical Narrative:    CM met with the patient again about attending another Inpatient rehab since [facility] IR has had limited beds available. Pt states he wants to stay in [place]. He currently is not wanting to attend an IR in another town.  TOC will follow.   Expected Discharge Plan: IP Rehab Facility Barriers to Discharge: Continued Medical Work up  Expected Discharge Plan and Services Expected Discharge Plan: [place]   Discharge Planning Services: CM Consult   Living arrangements for the past 2 months: Apartment                                       Social Determinants of Health (SDOH) Interventions    Readmission Risk Interventions No flowsheet data found.

## 2020-10-12 NOTE — Progress Notes (Signed)
Nephrology Follow-Up Consult note   Assessment/Recommendations: Christopher Chapman is a/an 65 y.o. 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 Creatinine was already in high 5's range in 03/17/20 and he was counseled that he was nearing ESRD again but missed his f/u appt 2 mths later. He is almost certainly ESRD again now; h/o noncompliance and currently with poor social support thus may not be a candidate for another transplant. -Plan for dialysis today and maintain MWF schedule - CLIP process (renal navigator notified) --> [facility] MWF 6am set up, needs to go day prior for paperwork - Dose medications for ESRD and avoid nephrotoxic agents as able given he still has some residual renal function. - Cut Tac in half to 2mg  BID but overall continue immunosuppressives for now until everything is sorted out but appears that he may not have the social support for another transplant. Therefore alloimmunization may be less of a concern.  Will wean off in outpt setting.    Renal osteodystrophy -phosphorus 8.8. Started sevelamer. PTH 440. Calcium low. 3 Ca bath and calcitriol 0.5 MWF for now. Anemia of CKD: Iron saturation 30 with ferritin of 712.  Hemoglobin 8s. Started aranesp 14mcg with dialysis 10/3. HTN - BP now improved. Watch with HD. SDH - s/p evacuation 9/28. H/o polysubstance abuse  Dispo: looks like he's going to CIR next.  Has outpt HD set up MWF when he discharges.   [facility] 10/12/2020 10:01 AM  ___________________________________________________________  CC: ESRD  Interval History/Subjective: Patient continues to feel well without any complaints.  Says no  issues with HD yesterday.  UOP 1.3L yesterday.  Intradialytic rise in Cr c/w ESRD.    Medications:  Current Facility-Administered Medications  Medication Dose Route Frequency Provider Last Rate Last Admin   0.9 %  sodium chloride infusion  100 mL Intravenous PRN Erick Colace, NP       0.9 %  sodium chloride infusion  100 mL Intravenous PRN Erick Colace, NP       acetaminophen (TYLENOL) tablet 650 mg  650 mg Oral Q4H PRN Erick Colace, NP       Or   acetaminophen (TYLENOL) suppository 650 mg  650 mg Rectal Q4H PRN Erick Colace, NP       allopurinol (ZYLOPRIM) tablet 200 mg  200 mg Oral Daily Erick Colace, NP   200 mg at 10/12/20 0820   alteplase (CATHFLO ACTIVASE) injection 2 mg  2 mg Intracatheter Once PRN Erick Colace, NP       amLODipine (NORVASC) tablet 5 mg  5 mg Oral Daily Erick Colace, NP   5 mg at 10/12/20 0820   calcitRIOL (ROCALTROL) capsule 0.5 mcg  0.5 mcg Oral Q M,W,F-HD Reesa Chew, MD   0.5 mcg at 10/10/20 1846   Chlorhexidine Gluconate Cloth 2 % PADS 6 each  6 each Topical Daily Erick Colace, NP   6 each at 10/12/20 7209   Darbepoetin Alfa (ARANESP) injection 60 mcg  60 mcg Intravenous Q Mon-HD Reesa Chew, MD   60 mcg at 10/10/20 1831   docusate sodium (COLACE) capsule 100 mg  100 mg Oral BID PRN Erick Colace, NP  hydrALAZINE (APRESOLINE) injection 10 mg  10 mg Intravenous Q4H PRN Maudie Mercury, MD       HYDROcodone-acetaminophen (NORCO/VICODIN) 5-325 MG per tablet 1 tablet  1 tablet Oral Q4H PRN Erick Colace, NP   1 tablet at 10/07/20 2030   insulin aspart (novoLOG) injection 0-15 Units  0-15 Units Subcutaneous TID WC Erick Colace, NP   2 Units at 10/12/20 0962   labetalol (NORMODYNE) tablet 300 mg  300 mg Oral TID Erick Colace, NP   300 mg at 10/12/20 8366   levETIRAcetam (KEPPRA) tablet 250 mg  250 mg Oral BID [name], MD   250 mg at 10/12/20 0819   lidocaine (PF) (XYLOCAINE) 1 % injection 5 mL  5 mL  Intradermal PRN Erick Colace, NP       lidocaine-prilocaine (EMLA) cream 1 application  1 application Topical PRN Erick Colace, NP       losartan (COZAAR) tablet 25 mg  25 mg Oral Daily Reesa Chew, MD   25 mg at 10/12/20 0820   mycophenolate (MYFORTIC) EC tablet 360 mg  360 mg Oral BID Erick Colace, NP   360 mg at 10/12/20 0819   ondansetron (ZOFRAN) tablet 4 mg  4 mg Oral Q4H PRN Erick Colace, NP       Or   ondansetron [facility]) injection 4 mg  4 mg Intravenous Q4H PRN Erick Colace, NP   4 mg at 10/06/20 1212   pantoprazole (PROTONIX) EC tablet 40 mg  40 mg Oral QHS Reome, Earle J, RPH   40 mg at 10/11/20 2025   pentafluoroprop-tetrafluoroeth (GEBAUERS) aerosol 1 application  1 application Topical PRN Erick Colace, NP       pneumococcal 23 valent vaccine (PNEUMOVAX-23) injection 0.5 mL  0.5 mL Intramuscular Tomorrow-1000 Lucious Groves, DO       senna (SENOKOT) tablet 8.6 mg  1 tablet Oral BID Erick Colace, NP   8.6 mg at 10/12/20 0820   sevelamer carbonate (RENVELA) tablet 800 mg  800 mg Oral TID WC Reesa Chew, MD   800 mg at 10/12/20 0820   tacrolimus (PROGRAF) capsule 2 mg  2 mg Oral BID Reesa Chew, MD   2 mg at 10/12/20 0820      Review of Systems: 10 systems reviewed and negative except per interval history/subjective  Physical Exam: Vitals:   10/11/20 2013 10/12/20 0337  BP: (!) 113/59 137/78  Pulse: 68 71  Resp: 19 17  Temp: 98.1 F (36.7 C) 98.1 F (36.7 C)  SpO2: 93% 99%   No intake/output data recorded.  Intake/Output Summary (Last 24 hours) at 10/12/2020 1001 Last data filed at 10/12/2020 0700 Gross per 24 hour  Intake --  Output 950 ml  Net -950 ml    Constitutional: Lying in bed, no acute distress  ENMT: MMM CV: normal rate Respiratory: Bilateral chest rise, normal work of breathing with a few scattered rhonchi, no rales Gastrointestinal: soft, non-tender Skin: no visible lesions or rashes Psych: alert,  calm Dialysis access: LUE AVF +T/b Extr: no edema   Test Results I personally reviewed new and old clinical labs and radiology tests Lab Results  Component Value Date   NA 131 (L) 10/12/2020   K 3.5 10/12/2020   CL 97 (L) 10/12/2020   CO2 25 10/12/2020   BUN 53 (H) 10/12/2020   CREATININE 7.58 (H) 10/12/2020   CALCIUM 6.3 (LL) 10/12/2020   ALBUMIN 2.6 (  L) 10/07/2020   PHOS 8.8 (H) 10/06/2020

## 2020-10-13 ENCOUNTER — Other Ambulatory Visit: Payer: Self-pay

## 2020-10-13 DIAGNOSIS — N184 Chronic kidney disease, stage 4 (severe): Secondary | ICD-10-CM | POA: Diagnosis not present

## 2020-10-13 DIAGNOSIS — R4182 Altered mental status, unspecified: Secondary | ICD-10-CM | POA: Diagnosis not present

## 2020-10-13 LAB — BASIC METABOLIC PANEL
Anion gap: 11 (ref 5–15)
BUN: 36 mg/dL — ABNORMAL HIGH (ref 8–23)
CO2: 25 mmol/L (ref 22–32)
Calcium: 7.4 mg/dL — ABNORMAL LOW (ref 8.9–10.3)
Chloride: 98 mmol/L (ref 98–111)
Creatinine, Ser: 6.25 mg/dL — ABNORMAL HIGH (ref 0.61–1.24)
GFR, Estimated: 9 mL/min — ABNORMAL LOW (ref >60)
Glucose, Bld: 116 mg/dL — ABNORMAL HIGH (ref 70–99)
Potassium: 3.7 mmol/L (ref 3.5–5.1)
Sodium: 134 mmol/L — ABNORMAL LOW (ref 135–145)

## 2020-10-13 LAB — CBC
HCT: 26 % — ABNORMAL LOW (ref 39.0–52.0)
Hemoglobin: 8 g/dL — ABNORMAL LOW (ref 13.0–17.0)
MCH: 27.5 pg (ref 26.0–34.0)
MCHC: 30.8 g/dL (ref 30.0–36.0)
MCV: 89.3 fL (ref 80.0–100.0)
Platelets: 144 10*3/uL — ABNORMAL LOW (ref 150–400)
RBC: 2.91 MIL/uL — ABNORMAL LOW (ref 4.22–5.81)
RDW: 15.9 % — ABNORMAL HIGH (ref 11.5–15.5)
WBC: 12.3 10*3/uL — ABNORMAL HIGH (ref 4.0–10.5)
nRBC: 0.2 % (ref 0.0–0.2)

## 2020-10-13 LAB — GLUCOSE, CAPILLARY
Glucose-Capillary: 132 mg/dL — ABNORMAL HIGH (ref 70–99)
Glucose-Capillary: 110 mg/dL — ABNORMAL HIGH (ref 70–99)
Glucose-Capillary: 161 mg/dL — ABNORMAL HIGH (ref 70–99)
Glucose-Capillary: 121 mg/dL — ABNORMAL HIGH (ref 70–99)

## 2020-10-13 NOTE — Progress Notes (Signed)
Nephrology Follow-Up Consult note   Assessment/Recommendations: Christopher Chapman is a/an 65 y.o. 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 Creatinine was already in high 5's range in 03/17/20 and he was counseled that he was nearing ESRD again but missed his f/u appt 2 mths later. He is almost certainly ESRD again now; h/o noncompliance and currently with poor social support thus may not be a candidate for another transplant. -Plan for dialysis today and maintain MWF schedule but will need to work around [place] process (renal navigator notified) --> [facility] MWF 6am set up, needs to go day prior for paperwork - Dose medications for ESRD and avoid nephrotoxic agents as able given he still has some residual renal function. - Cut Tac in half to 2mg  BID but overall continue immunosuppressives for now until everything is sorted out but appears that he may not have the social support for another transplant. Therefore alloimmunization may be less of a concern.  Will wean off in outpt setting.    Renal osteodystrophy -phosphorus 8.8. Started sevelamer. PTH 440. Calcium low. 3 Ca bath and calcitriol 0.5 MWF for now. Anemia of CKD: Iron saturation 30 with ferritin of 712.  Hemoglobin 8s. Started aranesp 71mcg with dialysis 10/3. HTN - BP now improved. Watch with HD. SDH - s/p evacuation 9/28. Reaccumulated and needs another surgery 10/7 H/o polysubstance abuse  Dispo: looks like he's going to CIR next.  Has outpt HD set up MWF when he discharges.   [facility] 10/13/2020 11:51 AM  ___________________________________________________________  CC:  ESRD  Interval History/Subjective: Patient continues to feel well without any complaints.  Says no issues with HD yesterday.  UF 1.8L yesterday.  Intradialytic rise in Cr c/w ESRD.    Medications:  Current Facility-Administered Medications  Medication Dose Route Frequency Provider Last Rate Last Admin   0.9 %  sodium chloride infusion  100 mL Intravenous PRN Erick Colace, NP       0.9 %  sodium chloride infusion  100 mL Intravenous PRN Erick Colace, NP       acetaminophen (TYLENOL) tablet 650 mg  650 mg Oral Q4H PRN Erick Colace, NP       Or   acetaminophen (TYLENOL) suppository 650 mg  650 mg Rectal Q4H PRN Erick Colace, NP       allopurinol (ZYLOPRIM) tablet 200 mg  200 mg Oral Daily Erick Colace, NP   200 mg at 10/13/20 0806   alteplase (CATHFLO ACTIVASE) injection 2 mg  2 mg Intracatheter Once PRN Erick Colace, NP       amLODipine (NORVASC) tablet 5 mg  5 mg Oral Daily Erick Colace, NP   5 mg at 10/13/20 5329   calcitRIOL (ROCALTROL) capsule 0.5 mcg  0.5 mcg Oral Q M,W,F-HD Reesa Chew, MD   0.5 mcg at 10/12/20 1227   Chlorhexidine Gluconate Cloth 2 % PADS 6 each  6 each Topical Daily Erick Colace, NP   6 each at 10/13/20 9242   Darbepoetin Alfa (ARANESP) injection 60 mcg  60 mcg Intravenous Q Mon-HD Reesa Chew, MD   60 mcg at 10/10/20 1831   docusate  sodium (COLACE) capsule 100 mg  100 mg Oral BID PRN Erick Colace, NP       hydrALAZINE (APRESOLINE) injection 10 mg  10 mg Intravenous Q4H PRN Maudie Mercury, MD       HYDROcodone-acetaminophen (NORCO/VICODIN) 5-325 MG per tablet 1 tablet  1 tablet Oral Q4H PRN Erick Colace, NP   1 tablet at 10/07/20 2030   insulin aspart (novoLOG) injection 0-15 Units  0-15 Units Subcutaneous TID WC Erick Colace, NP   2 Units at 10/13/20 2836   labetalol (NORMODYNE) tablet 300 mg  300 mg Oral TID Erick Colace, NP   300 mg at 10/13/20 0805   levETIRAcetam (KEPPRA) tablet 250 mg  250 mg Oral BID  [name], MD   250 mg at 10/13/20 0806   lidocaine (PF) (XYLOCAINE) 1 % injection 5 mL  5 mL Intradermal PRN Erick Colace, NP       lidocaine-prilocaine (EMLA) cream 1 application  1 application Topical PRN Erick Colace, NP       losartan (COZAAR) tablet 25 mg  25 mg Oral Daily Reesa Chew, MD   25 mg at 10/13/20 0806   mycophenolate (MYFORTIC) EC tablet 360 mg  360 mg Oral BID Erick Colace, NP   360 mg at 10/13/20 0805   ondansetron (ZOFRAN) tablet 4 mg  4 mg Oral Q4H PRN Erick Colace, NP       Or   ondansetron [facility]) injection 4 mg  4 mg Intravenous Q4H PRN Erick Colace, NP   4 mg at 10/06/20 1212   pantoprazole (PROTONIX) EC tablet 40 mg  40 mg Oral QHS Reome, Earle J, RPH   40 mg at 10/12/20 2219   pentafluoroprop-tetrafluoroeth (GEBAUERS) aerosol 1 application  1 application Topical PRN Erick Colace, NP       senna (SENOKOT) tablet 8.6 mg  1 tablet Oral BID Erick Colace, NP   8.6 mg at 10/13/20 0806   sevelamer carbonate (RENVELA) tablet 800 mg  800 mg Oral TID WC Reesa Chew, MD   800 mg at 10/13/20 0805   tacrolimus (PROGRAF) capsule 2 mg  2 mg Oral BID Reesa Chew, MD   2 mg at 10/13/20 0805      Review of Systems: 10 systems reviewed and negative except per interval history/subjective  Physical Exam: Vitals:   10/13/20 0805 10/13/20 1145  BP: 127/68 (!) 142/81  Pulse: 73 72  Resp: 18 18  Temp: 97.7 F (36.5 C) 98 F (36.7 C)  SpO2: 91% 100%   No intake/output data recorded.  Intake/Output Summary (Last 24 hours) at 10/13/2020 1151 Last data filed at 10/13/2020 0430 Gross per 24 hour  Intake 480 ml  Output 1800 ml  Net -1320 ml    Constitutional: Lying in bed, no acute distress  ENMT: MMM CV: normal rate Respiratory: Bilateral chest rise, normal work of breathing with a few scattered rhonchi, no rales Gastrointestinal: soft, non-tender Skin: no visible lesions or rashes Psych: alert, calm Dialysis access: LUE  AVF +T/b Extr: no edema   Test Results I personally reviewed new and old clinical labs and radiology tests Lab Results  Component Value Date   NA 134 (L) 10/13/2020   K 3.7 10/13/2020   CL 98 10/13/2020   CO2 25 10/13/2020   BUN 36 (H) 10/13/2020   CREATININE 6.25 (H) 10/13/2020   CALCIUM 7.4 (L) 10/13/2020   ALBUMIN 2.6 (L) 10/07/2020  PHOS 8.8 (H) 10/06/2020

## 2020-10-13 NOTE — Progress Notes (Signed)
Inpatient Rehab Admissions Coordinator:   Note plans for further surgery, hopefully tomorrow.  Will follow for post op rehab needs.   Shann Medal, PT, DPT Admissions Coordinator [phone] 10/13/20  11:00 AM

## 2020-10-13 NOTE — Progress Notes (Signed)
Subjective:   O/N Events: None  Patient was seen this morning during bedside rounds.  He endorses having some right lower leg tingling, but otherwise he does not have any complaints or concerns today.  Objective:  Vital signs in last 24 hours: Vitals:   10/13/20 0329 10/13/20 0420 10/13/20 0805 10/13/20 1145  BP: 137/74  127/68 (!) 142/81  Pulse: 73  73 72  Resp: 17  18 18   Temp:   97.7 F (36.5 C) 98 F (36.7 C)  TempSrc:   Oral Oral  SpO2: 100%  91% 100%  Weight:  102.9 kg    Height:       CBC Latest Ref Rng & Units 10/13/2020 10/12/2020 10/11/2020  WBC 4.0 - 10.5 K/uL 12.3(H) 11.5(H) 15.1(H)  Hemoglobin 13.0 - 17.0 g/dL 8.0(L) 8.2(L) 8.2(L)  Hematocrit 39.0 - 52.0 % 26.0(L) 26.0(L) 25.7(L)  Platelets 150 - 400 K/uL 144(L) 170 167    BMP Latest Ref Rng & Units 10/13/2020 10/12/2020 10/11/2020  Glucose 70 - 99 mg/dL 116(H) 117(H) 131(H)  BUN 8 - 23 mg/dL 36(H) 53(H) 42(H)  Creatinine 0.61 - 1.24 mg/dL 6.25(H) 7.58(H) 5.92(H)  BUN/Creat Ratio 10 - 24 - - -  Sodium 135 - 145 mmol/L 134(L) 131(L) 132(L)  Potassium 3.5 - 5.1 mmol/L 3.7 3.5 3.7  Chloride 98 - 111 mmol/L 98 97(L) 97(L)  CO2 22 - 32 mmol/L 25 25 27   Calcium 8.9 - 10.3 mg/dL 7.4(L) 6.3(LL) 7.2(L)     Physical Exam Constitutional:      General: He is not in acute distress.    Appearance: Normal appearance. He is not ill-appearing, toxic-appearing or diaphoretic.     Comments: Resting comfortably in bed, answers questions appropriately, no acute distress  HENT:     Head:     Comments: R surgical site shows no erythema, drainage, or purulence. Staples intact.  Cardiovascular:     Rate and Rhythm: Normal rate and regular rhythm.     Pulses: Normal pulses.     Heart sounds: Normal heart sounds. No murmur heard.   No friction rub. No gallop.  Pulmonary:     Effort: Pulmonary effort is normal.     Breath sounds: Normal breath sounds. No wheezing, rhonchi or rales.  Musculoskeletal:     Right lower leg: No  edema.     Left lower leg: No edema.     Comments:    Skin:    General: Skin is warm and dry.  Neurological:     Mental Status: He is alert and oriented to person, place, and time.     Comments: Cranial nerves are intact.  Strength 5/5 in BLE. Decreased sensation to right lower leg compared to contralateral side.  Psychiatric:        Mood and Affect: Mood normal.        Behavior: Behavior normal.     Assessment/Plan:  Active Problems:   Subdural hematoma   Altered mental status   ABLA (acute blood loss anemia)   Stage 4 chronic kidney disease (HCC)   S/P craniotomy  Christopher Chapman is a 65 y/o M with a PMHx HTN, ETOH Abuse, cocaine abuse, DM, and ESRD w/kidney transplant, who presented for AMS and was found to have a SDH and admitted for Craniotomy and evacuation.   Subacute R SDH now S/P R Craniotomy and Evacuation:  Craniotomy and evacuation on 10/05/2020.  Appears well clinically, however follow-up CT scan shows reaccumulation of hypodense fluid collection consistent with  recurrent chronic subdural hematoma. Neurosurgery plans for repeat craniotomy for evacuation of the hematoma, tentative plan for Friday. Otherwise, appears to be a candidate for CIR. - Appreciate neurosurgery recommendations - Continue Frequent neuro checks - Continue keppra 250 mg BID for the next 6 days per neurosurgery  Hx CKD S/P Renal Transplant  w/ Progressive ESRD Being followed by Nephrology, receiving iHD M/W/F, CLIP process underway - Continue mycophenolate and tacro per nephrology - Continue BP control  - HD per nephrology today - Trend BMP   HTN:   - Amlodipine 5 mg daily - Labetolol 300 mg TID - PRN hydralazine   DM:  - Continue SSI   Acute Blood Loss Anemia Platelet Dysfunction in the Setting of Uremia:  Patient s/p 1 PRBC 9/28  - Trend CBC - Transfuse for Hgb < 7    Hx Polysubstance Abuse:  - Will need substance abuse counseling   Prior to Admission Living Arrangement:  Home Anticipated Discharge Location: Appropriate candidate for CIR Barriers to Discharge: Repeat craniotomy by NSG, tentative plan for Friday Dispo: Anticipated discharge pending surgery and other medical workup  Orvis Brill, MD 10/13/2020, 2:23 PM After 5pm on weekdays and 1pm on weekends: On Call pager [phone]

## 2020-10-13 NOTE — Progress Notes (Signed)
SCDs on per order.

## 2020-10-13 NOTE — Progress Notes (Signed)
Subjective: Patient reports no headache.  He is resting comfortably.  Objective: Vital signs in last 24 hours: Temp:  [97.7 F (36.5 C)-98.8 F (37.1 C)] 97.7 F (36.5 C) (10/06 0805) Pulse Rate:  [66-77] 73 (10/06 0805) Resp:  [17-20] 18 (10/06 0805) BP: (108-149)/(58-79) 127/68 (10/06 0805) SpO2:  [91 %-100 %] 91 % (10/06 0805) Weight:  [100 kg-102.9 kg] 102.9 kg (10/06 0420)  Intake/Output from previous day: 10/05 0701 - 10/06 0700 In: 480 [P.O.:480] Out: 1800  Intake/Output this shift: No intake/output data recorded.  Neurologic: Grossly normal  Lab Results: Lab Results  Component Value Date   WBC 12.3 (H) 10/13/2020   HGB 8.0 (L) 10/13/2020   HCT 26.0 (L) 10/13/2020   MCV 89.3 10/13/2020   PLT 144 (L) 10/13/2020   Lab Results  Component Value Date   INR 1.1 10/05/2020   BMET Lab Results  Component Value Date   NA 134 (L) 10/13/2020   K 3.7 10/13/2020   CL 98 10/13/2020   CO2 25 10/13/2020   GLUCOSE 116 (H) 10/13/2020   BUN 36 (H) 10/13/2020   CREATININE 6.25 (H) 10/13/2020   CALCIUM 7.4 (L) 10/13/2020    Studies/Results: CT HEAD WO CONTRAST (5MM)  Result Date: 10/12/2020 CLINICAL DATA:  Follow-up subdural hemorrhage EXAM: CT HEAD WITHOUT CONTRAST TECHNIQUE: Contiguous axial images were obtained from the base of the skull through the vertex without intravenous contrast. COMPARISON:  10/06/2020 FINDINGS: Brain: Status post right parietal craniotomy with interval removal of previously noted drainage catheter. Mixed density collection overlying the right cerebral convexity, measuring up to 2.4 cm (series 3, image 23), previously up to 1.0 cm when remeasured similarly. Hyperdense material within the collection may reflect new hemorrhage. Decreased air within the collection. Increased right-to-left midline shift, now measuring 1.4 cm at the level of the corpus callosum, previously 1.0 cm when remeasured similarly. Increased effacement of the lateral ventricles,  right greater than left. Slightly increased crowding of the left ambient cistern. Previously noted hypodensity in the left occipital lobe is not definitively seen on the current study. Vascular: No hyperdense vessel. Skull: Status post right parietal craniotomy. Sinuses/Orbits: Mild mucosal thickening in the left maxillary sinus. Remote left lamina papyracea fracture. Other: Postoperative soft tissue gas and swelling in the overlying scalp. IMPRESSION: 1. Symptoms right parietal craniotomy with removal of previously noted drainage catheter and interval increase in the size of a mixed density collection overlying the right cerebral convexity, now measuring up to 2.4 cm, with hyperdense material possibly reflecting acute blood products and decreased air within the collection. Increased right-to-left midline shift, now measuring up to 1.4 cm, with increased effacement of the lateral ventricles. 2. Previously noted hypodensity in the left occipital lobe is not seen on the prior study. These results were called by telephone at the time of interpretation on 10/12/2020 at 12:35 pm to provider [name] , who verbally acknowledged these results. Electronically Signed   By: Merilyn Baba M.D.   On: 10/12/2020 12:36    Assessment/Plan: Reaccumulation of chronic subdural hematoma on the right.  I have explained this to the patient.  We have recommended redo craniotomy for repeat evacuation and placement of a subdural drain.  We will try to do this tomorrow.  He understands risk of the surgery include but are not limited to bleeding, infection, brain injury, stroke, loss of vision, numbness, weakness, paralysis, lack of relief of symptoms, worsening symptoms, need for further surgery, and anesthesia risk.  He agrees to proceed  Estimated body mass index is 29.93 kg/m as calculated from the following:   Height as of this encounter: 6\' 1"  (1.854 m).   Weight as of this encounter: 102.9 kg.    LOS: 8 days    [name] 10/13/2020, 8:11 AM

## 2020-10-13 NOTE — Progress Notes (Addendum)
Physical Therapy Treatment Patient Details Name: Christopher Chapman MRN: [id number] DOB: 10/31/1955 Today's Date: 10/13/2020   History of Present Illness This 65 y.o. male admitted after being found down for unknown amount of time. He was found to have large rt sided chronic SDH with 1.7 cm midline shift and mass effect. He underwent craniotomy for evacuation on 9/28. Pt to start HD. Head CT 10/5 showing increased right-to-left midline shift, now measuring up to 1.4 cm, with increased effacement of the lateral ventricles, now plan for re-do craniotomy for evacuation of recurrent SDH on 10/7. PMH includes: Chronic renal failure, ESRD, gout, h/o cocaine abuse, HTN.    PT Comments    Pt received in supine, agreeable to therapy session initially but with poor effort and limited due to emotional lability and decreased insight into deficits. Pt performed roll to L for pressure relief but then defers EOB/OOB mobility. Extensive discussion on importance of maintaining functional strength and progressing balance toward pt goals of going home as well as for pressure relief but pt frustrated and reports "I am fine." Pt given HEP to perform in supine, encouraged him to perform TID as able. Noted plan for OR next date, supervising PT to reassess pt post-op.   Recommendations for follow up therapy are one component of a multi-disciplinary discharge planning process, led by the attending physician.  Recommendations may be updated based on patient status, additional functional criteria and insurance authorization.  Follow Up Recommendations  CIR     Equipment Recommendations  Other (comment) (TBD)    Recommendations for Other Services Rehab consult     Precautions / Restrictions Precautions Precautions: Fall Precaution Comments: fall risk, poor insight into deficits, decreased L attention/awareness and L hemi Restrictions Weight Bearing Restrictions: No     Mobility  Bed Mobility Overal bed mobility:  Needs Assistance Bed Mobility: Rolling Rolling: Min assist   Supine to sit: Min assist Sit to supine: Mod assist   General bed mobility comments: cues for cross-body reaching to opposite bed rail, pt agreeable to roll to L side to take pressure off back    Transfers Overall transfer level: Needs assistance    General transfer comment: defer, pt refusing despite education       Modified Rankin (Stroke Patients Only) Modified Rankin (Stroke Patients Only) Pre-Morbid Rankin Score: No significant disability Modified Rankin: Severe disability     Balance       Sitting balance - Comments: UTA, pt refusing EOB         Cognition Arousal/Alertness: Awake/alert Behavior During Therapy: Impulsive Overall Cognitive Status: Impaired/Different from baseline                       Memory: Decreased recall of precautions;Decreased short-term memory Following Commands: Follows one step commands inconsistently Safety/Judgement: Decreased awareness of safety;Decreased awareness of deficits Awareness: Intellectual Problem Solving: Slow processing;Difficulty sequencing;Requires verbal cues General Comments: Pt with increased emotional lability and poor motivation to mobilize, although conversational with therapist. Emphasis on importance of mobility and pt education on exercises in bed, pt defers OOB or EOB mobility despite >8 mins discussion on benefits of therapy participation. Pt with continued unrealistic expectations of his recovery and poor insight into deficits.      Exercises Other Exercises Other Exercises: pt moving LE restlessly at times, pt instructed on supine HEP handout (placed on tray table in room) but with limited attention, will need reinforcement    General Comments General comments (skin integrity, edema, etc.):  no acute s/sx distress      Pertinent Vitals/Pain Pain Assessment: No/denies pain Pain Intervention(s): Limited activity within patient's  tolerance;Repositioned     PT Goals (current goals can now be found in the care plan section) Acute Rehab PT Goals Patient Stated Goal: to be independent at home PT Goal Formulation: Patient unable to participate in goal setting Time For Goal Achievement: 10/21/20 Progress towards PT goals: Not progressing toward goals - comment;PT to reassess next treatment (plan for OR tomorrow)    Frequency    Min 4X/week      PT Plan Current plan remains appropriate       AM-PAC PT "6 Clicks" Mobility   Outcome Measure  Help needed turning from your back to your side while in a flat bed without using bedrails?: A Little Help needed moving from lying on your back to sitting on the side of a flat bed without using bedrails?: A Lot Help needed moving to and from a bed to a chair (including a wheelchair)?: A Lot Help needed standing up from a chair using your arms (e.g., wheelchair or bedside chair)?: A Lot Help needed to walk in hospital room?: Total Help needed climbing 3-5 steps with a railing? : Total 6 Click Score: 11    End of Session   Activity Tolerance: Other (comment) (pt with limited participation and motivation to participate; mostly education on benefits of mobility and risks of immobility) Patient left: in bed;with call bell/phone within reach;with bed alarm set;Other (comment) (sidelying to L side) Nurse Communication: Mobility status;Other (comment) (pt refusing EOB/OOB) PT Visit Diagnosis: Unsteadiness on feet (R26.81);Muscle weakness (generalized) (M62.81);Other abnormalities of gait and mobility (R26.89);Difficulty in walking, not elsewhere classified (R26.2);Other symptoms and signs involving the nervous system (R29.898);Hemiplegia and hemiparesis Hemiplegia - Right/Left: Left Hemiplegia - dominant/non-dominant: Non-dominant     Time: 1324-4010 PT Time Calculation (min) (ACUTE ONLY): 10 min  Charges:  $Therapeutic Activity: 8-22 mins                     Vyctoria Dickman P.,  PTA Acute Rehabilitation Services Pager: [phone] Office: [place] 10/13/2020, 5:14 PM

## 2020-10-14 ENCOUNTER — Encounter (HOSPITAL_COMMUNITY)
Admission: EM | Disposition: A | Discharge status: Rehabilitation Facility | Payer: Self-pay | Source: Home / Self Care | Attending: Internal Medicine

## 2020-10-14 ENCOUNTER — Inpatient Hospital Stay (HOSPITAL_COMMUNITY): Payer: Medicaid Other | Admitting: Anesthesiology

## 2020-10-14 ENCOUNTER — Encounter (HOSPITAL_COMMUNITY): Payer: Self-pay | Admitting: Internal Medicine

## 2020-10-14 DIAGNOSIS — Z20822 Contact with and (suspected) exposure to covid-19: Secondary | ICD-10-CM | POA: Diagnosis not present

## 2020-10-14 DIAGNOSIS — N184 Chronic kidney disease, stage 4 (severe): Secondary | ICD-10-CM | POA: Diagnosis not present

## 2020-10-14 DIAGNOSIS — R4182 Altered mental status, unspecified: Secondary | ICD-10-CM | POA: Diagnosis not present

## 2020-10-14 DIAGNOSIS — N186 End stage renal disease: Secondary | ICD-10-CM | POA: Diagnosis not present

## 2020-10-14 DIAGNOSIS — Z23 Encounter for immunization: Secondary | ICD-10-CM | POA: Diagnosis not present

## 2020-10-14 DIAGNOSIS — I6202 Nontraumatic subacute subdural hemorrhage: Secondary | ICD-10-CM | POA: Diagnosis not present

## 2020-10-14 HISTORY — PX: CRANIOTOMY: SHX93

## 2020-10-14 LAB — CBC
HCT: 24.5 % — ABNORMAL LOW (ref 39.0–52.0)
Hemoglobin: 7.6 g/dL — ABNORMAL LOW (ref 13.0–17.0)
MCH: 27.6 pg (ref 26.0–34.0)
MCHC: 31 g/dL (ref 30.0–36.0)
MCV: 89.1 fL (ref 80.0–100.0)
Platelets: 174 10*3/uL (ref 150–400)
RBC: 2.75 MIL/uL — ABNORMAL LOW (ref 4.22–5.81)
RDW: 16.2 % — ABNORMAL HIGH (ref 11.5–15.5)
WBC: 10.3 10*3/uL (ref 4.0–10.5)
nRBC: 0.2 % (ref 0.0–0.2)

## 2020-10-14 LAB — BASIC METABOLIC PANEL
Anion gap: 10 (ref 5–15)
BUN: 48 mg/dL — ABNORMAL HIGH (ref 8–23)
CO2: 25 mmol/L (ref 22–32)
Calcium: 6.9 mg/dL — ABNORMAL LOW (ref 8.9–10.3)
Chloride: 98 mmol/L (ref 98–111)
Creatinine, Ser: 7.69 mg/dL — ABNORMAL HIGH (ref 0.61–1.24)
GFR, Estimated: 7 mL/min — ABNORMAL LOW (ref >60)
Glucose, Bld: 108 mg/dL — ABNORMAL HIGH (ref 70–99)
Potassium: 3.9 mmol/L (ref 3.5–5.1)
Sodium: 133 mmol/L — ABNORMAL LOW (ref 135–145)

## 2020-10-14 LAB — GLUCOSE, CAPILLARY
Glucose-Capillary: 114 mg/dL — ABNORMAL HIGH (ref 70–99)
Glucose-Capillary: 109 mg/dL — ABNORMAL HIGH (ref 70–99)
Glucose-Capillary: 142 mg/dL — ABNORMAL HIGH (ref 70–99)
Glucose-Capillary: 164 mg/dL — ABNORMAL HIGH (ref 70–99)

## 2020-10-14 LAB — PROTIME-INR
INR: 1.1 (ref 0.8–1.2)
Prothrombin Time: 13.7 seconds (ref 11.4–15.2)

## 2020-10-14 LAB — PREPARE RBC (CROSSMATCH)

## 2020-10-14 SURGERY — CRANIOTOMY HEMATOMA EVACUATION SUBDURAL
Anesthesia: General | Site: Head | Laterality: Right

## 2020-10-14 MED ORDER — CHLORHEXIDINE GLUCONATE CLOTH 2 % EX PADS: 6.0000 | MEDICATED_PAD | Freq: Once | CUTANEOUS | Status: DC

## 2020-10-14 MED ORDER — BACITRACIN ZINC 500 UNIT/GM EX OINT
TOPICAL_OINTMENT | CUTANEOUS | Status: AC
  Filled 2020-10-14: qty 28.35

## 2020-10-14 MED ORDER — CHLORHEXIDINE GLUCONATE 0.12 % MT SOLN
OROMUCOSAL | Status: AC
  Filled 2020-10-14: qty 15

## 2020-10-14 MED ORDER — SUGAMMADEX SODIUM 200 MG/2ML IV SOLN
INTRAVENOUS | Status: DC | PRN
Start: 2020-10-14 — End: 2020-10-14
  Administered 2020-10-14: 200 mg via INTRAVENOUS

## 2020-10-14 MED ORDER — LIDOCAINE 2% (20 MG/ML) 5 ML SYRINGE
INTRAMUSCULAR | Status: DC | PRN
  Administered 2020-10-14: 100 mg via INTRAVENOUS

## 2020-10-14 MED ORDER — DEXAMETHASONE SODIUM PHOSPHATE 10 MG/ML IJ SOLN: 10.0000 mg | Freq: Once | INTRAMUSCULAR | Status: DC

## 2020-10-14 MED ORDER — SODIUM CHLORIDE 0.9 % IV SOLN: INTRAVENOUS | Status: DC | PRN

## 2020-10-14 MED ORDER — CEFAZOLIN SODIUM-DEXTROSE 1-4 GM/50ML-% IV SOLN
1.0000 g | Freq: Three times a day (TID) | INTRAVENOUS | Status: AC
  Administered 2020-10-14 – 2020-10-15 (×2): 1 g via INTRAVENOUS
  Filled 2020-10-14 (×2): qty 50

## 2020-10-14 MED ORDER — EPHEDRINE SULFATE-NACL 50-0.9 MG/10ML-% IV SOSY
PREFILLED_SYRINGE | INTRAVENOUS | Status: DC | PRN
  Administered 2020-10-14: 10 mg via INTRAVENOUS

## 2020-10-14 MED ORDER — FENTANYL CITRATE (PF) 100 MCG/2ML IJ SOLN
25.0000 ug | INTRAMUSCULAR | Status: DC | PRN
  Administered 2020-10-14 (×2): 25 ug via INTRAVENOUS

## 2020-10-14 MED ORDER — FENTANYL CITRATE (PF) 100 MCG/2ML IJ SOLN
INTRAMUSCULAR | Status: AC
  Filled 2020-10-14: qty 2

## 2020-10-14 MED ORDER — ONDANSETRON HCL 4 MG/2ML IJ SOLN: 4.0000 mg | Freq: Once | INTRAMUSCULAR | Status: DC | PRN

## 2020-10-14 MED ORDER — PHENYLEPHRINE HCL-NACL 20-0.9 MG/250ML-% IV SOLN
INTRAVENOUS | Status: DC | PRN
Start: 2020-10-14 — End: 2020-10-14
  Administered 2020-10-14: 50 ug/min via INTRAVENOUS

## 2020-10-14 MED ORDER — THROMBIN 20000 UNITS EX SOLR
CUTANEOUS | Status: AC
  Filled 2020-10-14: qty 20000

## 2020-10-14 MED ORDER — OXYCODONE HCL 5 MG PO TABS: 5.0000 mg | ORAL_TABLET | Freq: Once | ORAL | Status: DC | PRN

## 2020-10-14 MED ORDER — ACETAMINOPHEN 325 MG PO TABS: 650.0000 mg | ORAL_TABLET | ORAL | Status: DC | PRN

## 2020-10-14 MED ORDER — DEXAMETHASONE SODIUM PHOSPHATE 10 MG/ML IJ SOLN
INTRAMUSCULAR | Status: DC | PRN
  Administered 2020-10-14: 4 mg via INTRAVENOUS

## 2020-10-14 MED ORDER — ROCURONIUM BROMIDE 10 MG/ML (PF) SYRINGE
PREFILLED_SYRINGE | INTRAVENOUS | Status: DC | PRN
  Administered 2020-10-14: 60 mg via INTRAVENOUS

## 2020-10-14 MED ORDER — CEFAZOLIN SODIUM-DEXTROSE 2-4 GM/100ML-% IV SOLN
2.0000 g | INTRAVENOUS | Status: AC
  Administered 2020-10-14: 2 g via INTRAVENOUS
  Filled 2020-10-14: qty 100

## 2020-10-14 MED ORDER — LABETALOL HCL 5 MG/ML IV SOLN: 10.0000 mg | INTRAVENOUS | Status: DC | PRN

## 2020-10-14 MED ORDER — PROPOFOL 10 MG/ML IV BOLUS
INTRAVENOUS | Status: AC
  Filled 2020-10-14: qty 20

## 2020-10-14 MED ORDER — BACITRACIN ZINC 500 UNIT/GM EX OINT
TOPICAL_OINTMENT | CUTANEOUS | Status: DC | PRN
  Administered 2020-10-14: 1 via TOPICAL

## 2020-10-14 MED ORDER — POTASSIUM CHLORIDE IN NACL 20-0.9 MEQ/L-% IV SOLN
INTRAVENOUS | Status: DC
  Filled 2020-10-14: qty 1000

## 2020-10-14 MED ORDER — ROCURONIUM BROMIDE 10 MG/ML (PF) SYRINGE
PREFILLED_SYRINGE | INTRAVENOUS | Status: AC
  Filled 2020-10-14: qty 10

## 2020-10-14 MED ORDER — LIDOCAINE-EPINEPHRINE 1 %-1:100000 IJ SOLN
INTRAMUSCULAR | Status: AC
  Filled 2020-10-14: qty 1

## 2020-10-14 MED ORDER — 0.9 % SODIUM CHLORIDE (POUR BTL) OPTIME
TOPICAL | Status: DC | PRN
  Administered 2020-10-14: 3000 mL

## 2020-10-14 MED ORDER — FENTANYL CITRATE (PF) 250 MCG/5ML IJ SOLN
INTRAMUSCULAR | Status: AC
  Filled 2020-10-14: qty 5

## 2020-10-14 MED ORDER — LIDOCAINE 2% (20 MG/ML) 5 ML SYRINGE
INTRAMUSCULAR | Status: AC
  Filled 2020-10-14: qty 5

## 2020-10-14 MED ORDER — PROPOFOL 10 MG/ML IV BOLUS
INTRAVENOUS | Status: DC | PRN
  Administered 2020-10-14: 130 mg via INTRAVENOUS

## 2020-10-14 MED ORDER — ACETAMINOPHEN 650 MG RE SUPP: 650.0000 mg | RECTAL | Status: DC | PRN

## 2020-10-14 MED ORDER — MORPHINE SULFATE (PF) 2 MG/ML IV SOLN
1.0000 mg | INTRAVENOUS | Status: DC | PRN
  Administered 2020-10-14: 1 mg via INTRAVENOUS
  Administered 2020-10-14: 2 mg via INTRAVENOUS
  Filled 2020-10-14 (×2): qty 1

## 2020-10-14 MED ORDER — EPHEDRINE 5 MG/ML INJ
INTRAVENOUS | Status: AC
  Filled 2020-10-14: qty 5

## 2020-10-14 MED ORDER — THROMBIN 20000 UNITS EX SOLR
CUTANEOUS | Status: DC | PRN
  Administered 2020-10-14: 20 mL via TOPICAL

## 2020-10-14 MED ORDER — OXYCODONE HCL 5 MG/5ML PO SOLN
5.0000 mg | Freq: Once | ORAL | Status: DC | PRN
Start: 2020-10-14 — End: 2020-10-14

## 2020-10-14 MED ORDER — FENTANYL CITRATE (PF) 250 MCG/5ML IJ SOLN
INTRAMUSCULAR | Status: DC | PRN
  Administered 2020-10-14: 50 ug via INTRAVENOUS
  Administered 2020-10-14: 100 ug via INTRAVENOUS

## 2020-10-14 MED ORDER — THROMBIN 5000 UNITS EX SOLR
CUTANEOUS | Status: AC
  Filled 2020-10-14: qty 5000

## 2020-10-14 MED ORDER — PHENYLEPHRINE 40 MCG/ML (10ML) SYRINGE FOR IV PUSH (FOR BLOOD PRESSURE SUPPORT)
PREFILLED_SYRINGE | INTRAVENOUS | Status: AC
  Filled 2020-10-14: qty 10

## 2020-10-14 MED ORDER — PHENYLEPHRINE 40 MCG/ML (10ML) SYRINGE FOR IV PUSH (FOR BLOOD PRESSURE SUPPORT)
PREFILLED_SYRINGE | INTRAVENOUS | Status: DC | PRN
  Administered 2020-10-14: 80 ug via INTRAVENOUS

## 2020-10-14 MED ORDER — MENTHOL 3 MG MT LOZG
1.0000 | LOZENGE | OROMUCOSAL | Status: DC | PRN
  Filled 2020-10-14: qty 9

## 2020-10-14 MED ORDER — ONDANSETRON HCL 4 MG/2ML IJ SOLN
INTRAMUSCULAR | Status: DC | PRN
  Administered 2020-10-14: 4 mg via INTRAVENOUS

## 2020-10-14 MED ORDER — PANTOPRAZOLE SODIUM 40 MG IV SOLR: 40.0000 mg | Freq: Every day | INTRAVENOUS | Status: DC

## 2020-10-14 MED ORDER — THROMBIN 5000 UNITS EX SOLR
OROMUCOSAL | Status: DC | PRN
  Administered 2020-10-14: 5 mL via TOPICAL

## 2020-10-14 SURGICAL SUPPLY — 58 items
BAG COUNTER SPONGE SURGICOUNT (BAG) ×2 IMPLANT
BAG SPNG CNTER NS LX DISP (BAG) ×1
BAND INSRT 18 STRL LF DISP RB (MISCELLANEOUS)
BAND RUBBER #18 3X1/16 STRL (MISCELLANEOUS) IMPLANT
BUR SPIRAL ROUTER 2.3 (BUR) IMPLANT
CANISTER SUCT 3000ML PPV (MISCELLANEOUS) ×2 IMPLANT
CATH VENTRIC 35X38 W/TROCAR LG (CATHETERS) ×2 IMPLANT
CLIP VESOCCLUDE MED 6/CT (CLIP) IMPLANT
DRAIN JP 7F FLT 3/4 PRF SI HBL (DRAIN) ×2 IMPLANT
DRAPE MICROSCOPE LEICA (MISCELLANEOUS) IMPLANT
DRAPE NEUROLOGICAL W/INCISE (DRAPES) ×2 IMPLANT
DRAPE SURG 17X23 STRL (DRAPES) IMPLANT
DRAPE WARM FLUID 44X44 (DRAPES) ×2 IMPLANT
DURAPREP 6ML APPLICATOR 50/CS (WOUND CARE) ×2 IMPLANT
ELECT REM PT RETURN 9FT ADLT (ELECTROSURGICAL) ×2
ELECTRODE REM PT RTRN 9FT ADLT (ELECTROSURGICAL) ×1 IMPLANT
EVACUATOR 1/8 PVC DRAIN (DRAIN) IMPLANT
EVACUATOR SILICONE 100CC (DRAIN) ×2 IMPLANT
GAUZE 4X4 16PLY ~~LOC~~+RFID DBL (SPONGE) ×2 IMPLANT
GAUZE SPONGE 4X4 12PLY STRL (GAUZE/BANDAGES/DRESSINGS) ×2 IMPLANT
GLOVE SURG ENC MOIS LTX SZ7 (GLOVE) ×4 IMPLANT
GLOVE SURG ENC MOIS LTX SZ8 (GLOVE) ×2 IMPLANT
GLOVE SURG UNDER POLY LF SZ7 (GLOVE) ×4 IMPLANT
GOWN STRL REUS W/ TWL LRG LVL3 (GOWN DISPOSABLE) ×2 IMPLANT
GOWN STRL REUS W/ TWL XL LVL3 (GOWN DISPOSABLE) ×1 IMPLANT
GOWN STRL REUS W/TWL 2XL LVL3 (GOWN DISPOSABLE) IMPLANT
GOWN STRL REUS W/TWL LRG LVL3 (GOWN DISPOSABLE) ×4
GOWN STRL REUS W/TWL XL LVL3 (GOWN DISPOSABLE) ×2
HEMOSTAT POWDER KIT SURGIFOAM (HEMOSTASIS) ×2 IMPLANT
KIT BASIN OR (CUSTOM PROCEDURE TRAY) ×2 IMPLANT
KIT DRAIN CSF ACCUDRAIN (MISCELLANEOUS) ×2 IMPLANT
KIT TURNOVER KIT B (KITS) ×2 IMPLANT
NEEDLE HYPO 22GX1.5 SAFETY (NEEDLE) ×2 IMPLANT
NS IRRIG 1000ML POUR BTL (IV SOLUTION) ×6 IMPLANT
PACK CRANIOTOMY CUSTOM (CUSTOM PROCEDURE TRAY) ×2 IMPLANT
PAD ARMBOARD 7.5X6 YLW CONV (MISCELLANEOUS) ×2 IMPLANT
PATTIES SURGICAL .25X.25 (GAUZE/BANDAGES/DRESSINGS) IMPLANT
PATTIES SURGICAL .5 X.5 (GAUZE/BANDAGES/DRESSINGS) IMPLANT
PATTIES SURGICAL .5 X3 (DISPOSABLE) IMPLANT
PATTIES SURGICAL 1X1 (DISPOSABLE) IMPLANT
PERFORATOR LRG  14-11MM (BIT)
PERFORATOR LRG 14-11MM (BIT) IMPLANT
PIN MAYFIELD SKULL DISP (PIN) ×2 IMPLANT
SCREW UNIII AXS SD 1.5X4 (Screw) ×8 IMPLANT
SPONGE NEURO XRAY DETECT 1X3 (DISPOSABLE) IMPLANT
SPONGE SURGIFOAM ABS GEL 100 (HEMOSTASIS) ×2 IMPLANT
SPONGE T-LAP 4X18 ~~LOC~~+RFID (SPONGE) ×4 IMPLANT
STAPLER VISISTAT 35W (STAPLE) ×2 IMPLANT
SUT ETHILON 3 0 FSL (SUTURE) IMPLANT
SUT NURALON 4 0 TR CR/8 (SUTURE) ×4 IMPLANT
SUT VIC AB 2-0 CP2 18 (SUTURE) ×2 IMPLANT
SYR CONTROL 10ML LL (SYRINGE) ×2 IMPLANT
TAPE CLOTH SURG 4X10 WHT LF (GAUZE/BANDAGES/DRESSINGS) ×2 IMPLANT
TOWEL GREEN STERILE (TOWEL DISPOSABLE) ×2 IMPLANT
TOWEL GREEN STERILE FF (TOWEL DISPOSABLE) ×2 IMPLANT
TRAY FOLEY MTR SLVR 16FR STAT (SET/KITS/TRAYS/PACK) ×2 IMPLANT
UNDERPAD 30X36 HEAVY ABSORB (UNDERPADS AND DIAPERS) IMPLANT
WATER STERILE IRR 1000ML POUR (IV SOLUTION) ×2 IMPLANT

## 2020-10-14 NOTE — Transfer of Care (Signed)
Immediate Anesthesia Transfer of Care Note  Patient: Christopher Chapman  Procedure(s) Performed: Redo Right CRANIOTOMY HEMATOMA EVACUATION SUBDURAL (Right: Head)  Patient Location: PACU  Anesthesia Type:General  Level of Consciousness: awake and alert   Airway & Oxygen Therapy: Patient Spontanous Breathing and Patient connected to nasal cannula oxygen  Post-op Assessment: Report given to RN, Post -op Vital signs reviewed and stable, Patient moving all extremities X 4 and Patient able to stick tongue midline  Post vital signs: Reviewed and stable  Last Vitals:  Vitals Value Taken Time  BP 163/83 10/14/20 1155  Temp    Pulse 68 10/14/20 1157  Resp 14 10/14/20 1157  SpO2 100 % 10/14/20 1157  Vitals shown include unvalidated device data.  Last Pain:  Vitals:   10/14/20 0957  TempSrc: Oral  PainSc: 0-No pain         Complications: No notable events documented.

## 2020-10-14 NOTE — Anesthesia Preprocedure Evaluation (Signed)
Anesthesia Evaluation  Patient identified by MRN, date of birth, ID band Patient awake and Patient confused    Reviewed: Unable to perform ROS - Chart review onlyPreop documentation limited or incomplete due to emergent nature of procedure.  History of Anesthesia Complications Negative for: history of anesthetic complications  Airway Mallampati: III  TM Distance: >3 FB Neck ROM: Full    Dental  (+) Poor Dentition, Dental Advisory Given, Missing   Pulmonary Current Smoker,    Pulmonary exam normal        Cardiovascular hypertension, Pt. on medications (-) anginaNormal cardiovascular exam     Neuro/Psych Presented to ED with altered mental status.  According to report, patient was down for possibly 4 days at home and since then has been having weakness in his left arm and left leg as well as confusion: LARGE SUBDURAL    GI/Hepatic (+)     substance abuse  alcohol use and cocaine use,   Endo/Other    Renal/GU ESRF and DialysisRenal disease (s/p renal transplant, but requiring dialysis again)     Musculoskeletal   Abdominal   Peds  Hematology  (+) Blood dyscrasia (Hb 9.6), anemia ,   Anesthesia Other Findings   Reproductive/Obstetrics                           Anesthesia Physical  Anesthesia Plan  ASA: 4  Anesthesia Plan: General   Post-op Pain Management:    Induction: Intravenous  PONV Risk Score and Plan: 1 and Ondansetron, Treatment may vary due to age or medical condition and Dexamethasone  Airway Management Planned: Oral ETT  Additional Equipment: Arterial line  Intra-op Plan:   Post-operative Plan: Extubation in OR  Informed Consent: I have reviewed the patients History and Physical, chart, labs and discussed the procedure including the risks, benefits and alternatives for the proposed anesthesia with the patient or authorized representative who has indicated his/her  understanding and acceptance.     Dental advisory given  Plan Discussed with:   Anesthesia Plan Comments:       Anesthesia Quick Evaluation

## 2020-10-14 NOTE — Progress Notes (Signed)
Pt looks good,ready for surgery, plan re-do R crani for SDH

## 2020-10-14 NOTE — Op Note (Signed)
10/14/2020  11:54 AM  PATIENT:  Christopher Chapman  65 y.o. male  PRE-OPERATIVE DIAGNOSIS: Recurrent right chronic subdural hematoma  POST-OPERATIVE DIAGNOSIS:  same  SURGEON:  Sherley Bounds, MD  ASSISTANTS: Glenford Peers FNP  ANESTHESIA:   General  EBL: Less than 100 ml  Total I/O In: -  Out: 300 [Urine:300]  BLOOD ADMINISTERED: none  DRAINS: Subdural drain and subgaleal drain  SPECIMEN:  none  INDICATION FOR PROCEDURE: This patient presented with a large chronic subdural hemolast week and underwent a craniotomy for evacuation.  Follow-up imaging 2 days ago showed a large recurrent chronic subdural hematoma.  Recommended repeat craniotomy for evacuation of the recurrent subdural hematoma. Patient understood the risks, benefits, and alternatives and potential outcomes and wished to proceed.  PROCEDURE DETAILS:  PROCEDURE: The patient was taken to the operating room and after induction of adequate generalized endotracheal anesthesia, the head was placed on a doughnut, and turned to the left to expose the right frontotemporal parietal region. The head was shaved and then cleaned with alcohol and then prepped with DuraPrep and draped in the usual sterile fashion. 10 cc of local anesthetic was injected, and a curvilinear incision was made on the right of the head using the old incision.  There was a large hematoma in the subgaleal space that was removed with suction.  Raney clips were placed to establish hemostasis of the scalp, the muscle was reflected with the scalp flap, to expose the the old craniotomy flap.  The screws and the dog bone plates were removed and the bone flap was removed the flap was then placed in bacitracin-containing saline solution, and the dura was opened to expose greenish motor oil like fluid. A hematoma was then removed with a combination of irrigation and suction. I continued to irrigate until the irrigant was clear to, and dried any bleeding with bipolar cautery.  I then placed a subdural drain through separate stab incision and close the dura with a running 4-0 Nurolon suture. Dural tack up sutures were placed. The dura was lined with Gelfoam, and the craniotomy flap was replaced with doggie-bone plates. The wound was copiously irrigated. A subgaleal drain was placed, and the galea was then closed with interrupted 2-0 Vicryl suture. The skin was then closed with staples a sterile dressing was applied. The patient was then taken out of the 3-point [place] headrest and awakened from general anesthesia, and transported to the recovery room in stable condition. At the end of the procedure all sponge, needle, and instrument counts were correct.  PLAN OF CARE: Admit to inpatient   PATIENT DISPOSITION:  PACU - hemodynamically stable.   Delay start of Pharmacological VTE agent (>24hrs) due to surgical blood loss or risk of bleeding:  yes

## 2020-10-14 NOTE — Progress Notes (Signed)
Nephrology Follow-Up Consult note   Assessment/Recommendations: Christopher Chapman is a/an 65 y.o. 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 Creatinine was already in high 5's range in 03/17/20 and he was counseled that he was nearing ESRD again but missed his f/u appt 2 mths later. He is almost certainly ESRD again now; h/o noncompliance and currently with poor social support thus may not be a candidate for another transplant. -Plan for dialysis tomorrow - hold today since ICU and post op SDH evac and plan for tomorrow.  Will get back on MWF likely Monday - CLIP process (renal navigator notified) --> [facility] MWF 6am set up, needs to go day prior for paperwork - Dose medications for ESRD and avoid nephrotoxic agents as able given he still has some residual renal function. - Cut Tac in half to 2mg  BID but overall continue immunosuppressives for now until everything is sorted out but appears that he may not have the social support for another transplant. Therefore alloimmunization may be less of a concern.  Will wean off in outpt setting.    Renal osteodystrophy -phosphorus 8.8. Started sevelamer. PTH 440. Calcium low. 3 Ca bath and calcitriol 0.5 MWF for now. Anemia of CKD: Iron saturation 30 with ferritin of 712.  Hemoglobin 8s. Started aranesp 64mcg with dialysis 10/3. HTN - BP now improved. Watch with HD. SDH - s/p evacuation 9/28. Reaccumulated s/p 2nd surgery 10/7 H/o polysubstance abuse  Dispo: looks like he's going to CIR next.  Has outpt HD set up MWF when he discharges.   [facility] 10/14/2020 2:00  PM  ___________________________________________________________  CC: ESRD  Interval History/Subjective: s/p evac of reaccumumated SDH this AM in OR.  Moved to ICU due to presence of drains.  He's awake and feeling ok.  Will move HD to tomorrow.    Medications:  Current Facility-Administered Medications  Medication Dose Route Frequency Provider Last Rate Last Admin   0.9 %  sodium chloride infusion  100 mL Intravenous PRN [name], MD       0.9 %  sodium chloride infusion  100 mL Intravenous PRN [name], MD       0.9 % NaCl with KCl 20 mEq/ L  infusion   Intravenous Continuous [name], MD 50 mL/hr at 10/14/20 1356 New Bag at 10/14/20 1356   acetaminophen (TYLENOL) tablet 650 mg  650 mg Oral Q4H PRN [name], MD       Or   acetaminophen (TYLENOL) suppository 650 mg  650 mg Rectal Q4H PRN [name], MD       allopurinol (ZYLOPRIM) tablet 200 mg  200 mg Oral Daily [name], MD   200 mg at 10/14/20 0814   alteplase (CATHFLO ACTIVASE) injection 2 mg  2 mg Intracatheter Once PRN [name], MD       amLODipine (NORVASC) tablet 5 mg  5 mg Oral Daily [name], MD   5 mg at 10/14/20 4132   calcitRIOL (ROCALTROL) capsule 0.5 mcg  0.5 mcg Oral Q M,W,F-HD [name], MD   0.5 mcg at 10/12/20 1227   ceFAZolin (ANCEF) IVPB 1 g/50 mL premix  1 g  Intravenous Q8H [name], MD       chlorhexidine (PERIDEX) 0.12 % solution            Chlorhexidine Gluconate Cloth 2 % PADS 6 each  6 each Topical Daily [name], MD   6 each at 10/13/20 2725   Darbepoetin Alfa (ARANESP) injection 60 mcg  60 mcg Intravenous Q Mon-HD [name], MD   60 mcg at 10/10/20 1831   docusate sodium (COLACE) capsule 100 mg  100 mg Oral BID PRN [name], MD       fentaNYL (SUBLIMAZE) 100 MCG/2ML injection            hydrALAZINE (APRESOLINE) injection 10 mg  10 mg Intravenous Q4H PRN [name], MD       HYDROcodone-acetaminophen (NORCO/VICODIN) 5-325 MG per  tablet 1 tablet  1 tablet Oral Q4H PRN [name], MD   1 tablet at 10/14/20 1347   insulin aspart (novoLOG) injection 0-15 Units  0-15 Units Subcutaneous TID WC [name], MD   3 Units at 10/13/20 1703   labetalol (NORMODYNE) injection 10-40 mg  10-40 mg Intravenous Q10 min PRN [name], MD       labetalol (NORMODYNE) tablet 300 mg  300 mg Oral TID [name], MD   300 mg at 10/14/20 3664   levETIRAcetam (KEPPRA) tablet 250 mg  250 mg Oral BID [name], MD   250 mg at 10/14/20 4034   lidocaine (PF) (XYLOCAINE) 1 % injection 5 mL  5 mL Intradermal PRN [name], MD       lidocaine-prilocaine (EMLA) cream 1 application  1 application Topical PRN [name], MD       losartan (COZAAR) tablet 25 mg  25 mg Oral Daily [name], MD   25 mg at 10/14/20 0813   morphine 2 MG/ML injection 1-2 mg  1-2 mg Intravenous Q2H PRN [name], MD   1 mg at 10/14/20 1354   mycophenolate (MYFORTIC) EC tablet 360 mg  360 mg Oral BID [name], MD   360 mg at 10/14/20 0810   ondansetron (ZOFRAN) tablet 4 mg  4 mg Oral Q4H PRN [name], MD       Or   ondansetron [facility]) injection 4 mg  4 mg Intravenous Q4H PRN [name], MD   4 mg at 10/06/20 1212   pantoprazole (PROTONIX) EC tablet 40 mg  40 mg Oral QHS [name], MD   40 mg at 10/13/20 2203   pentafluoroprop-tetrafluoroeth (GEBAUERS) aerosol 1 application  1 application Topical PRN [name], MD       senna [facility]) tablet 8.6 mg  1 tablet Oral BID [name], MD   8.6 mg at 10/14/20 0813   sevelamer carbonate (RENVELA) tablet 800 mg  800 mg Oral TID WC [name], MD   800 mg at 10/13/20 1703   tacrolimus (PROGRAF) capsule 2 mg  2 mg Oral BID [name], MD   2 mg at 10/14/20 7425      Review of Systems: 10 systems reviewed and negative except per interval history/subjective  Physical Exam: Vitals:   10/14/20 1255 10/14/20 1310  BP: (!) 142/75 (!) 154/83  Pulse: 63 64  Resp:  11 15  Temp:  (!) 97.3 F (36.3 C)  SpO2: 100% 100%   Total I/O In: 850 [I.V.:750; Other:100] Out: 535 [Urine:500;  Drains:35]  Intake/Output Summary (Last 24 hours) at 10/14/2020 1400 Last data filed at 10/14/2020 1310 Gross per 24 hour  Intake 970 ml  Output 535 ml  Net 435 ml    Constitutional: Lying in bed, no acute distress  ENMT: MMM CV: normal rate Respiratory: Bilateral chest rise, normal work of breathing with a few scattered rhonchi, no rales Gastrointestinal: soft, non-tender Skin: no visible lesions or rashes Psych: alert, calm Dialysis access: LUE AVF +T/b Extr: no edema   Test Results I personally reviewed new and old clinical labs and radiology tests Lab Results  Component Value Date   NA 133 (L) 10/14/2020   K 3.9 10/14/2020   CL 98 10/14/2020   CO2 25 10/14/2020   BUN 48 (H) 10/14/2020   CREATININE 7.69 (H) 10/14/2020   CALCIUM 6.9 (L) 10/14/2020   ALBUMIN 2.6 (L) 10/07/2020   PHOS 8.8 (H) 10/06/2020

## 2020-10-14 NOTE — Anesthesia Postprocedure Evaluation (Signed)
Anesthesia Post Note  Patient: [name]  Procedure(s) Performed: Redo Right CRANIOTOMY HEMATOMA EVACUATION SUBDURAL (Right: Head)     Patient location during evaluation: PACU Anesthesia Type: General Level of consciousness: awake and alert Pain management: pain level controlled Vital Signs Assessment: post-procedure vital signs reviewed and stable Respiratory status: spontaneous breathing, nonlabored ventilation and respiratory function stable Cardiovascular status: blood pressure returned to baseline and stable Postop Assessment: no apparent nausea or vomiting Anesthetic complications: no   No notable events documented.  Last Vitals:  Vitals:   10/14/20 1310 10/14/20 1400  BP: (!) 154/83 (!) 149/82  Pulse: 64 65  Resp: 15 15  Temp: (!) 36.3 C   SpO2: 100% 97%    Last Pain:  Vitals:   10/14/20 1412  TempSrc:   PainSc: Asleep    LLE Motor Response: Purposeful movement;Responds to commands (10/14/20 1403)   RLE Motor Response: Purposeful movement;Responds to commands (10/14/20 1403)        Lidia Collum

## 2020-10-14 NOTE — Anesthesia Procedure Notes (Signed)
Procedure Name: Intubation Date/Time: 10/14/2020 10:25 AM Performed by: Wilburn Cornelia, CRNA Pre-anesthesia Checklist: Patient identified, Emergency Drugs available, Timeout performed, Patient being monitored and Suction available Patient Re-evaluated:Patient Re-evaluated prior to induction Oxygen Delivery Method: Circle system utilized Preoxygenation: Pre-oxygenation with 100% oxygen Induction Type: IV induction Ventilation: Mask ventilation without difficulty and Oral airway inserted - appropriate to patient size Laryngoscope Size: Mac and 4 Grade View: Grade I Tube type: Oral Tube size: 7.5 mm Number of attempts: 2 Airway Equipment and Method: Stylet and Video-laryngoscopy Placement Confirmation: breath sounds checked- equal and bilateral, CO2 detector, ETT inserted through vocal cords under direct vision and positive ETCO2 Secured at: 24 cm Tube secured with: Tape Dental Injury: Teeth and Oropharynx as per pre-operative assessment  Comments: DLx 1 with MAC 4 - grade 4 view.  Oral airway placed and masked with ease.  DLx1 with Glidescope - grade 1 view.  7.5 ETT placed with ease.

## 2020-10-14 NOTE — Plan of Care (Signed)
Pt is alert oriented x 4. Denies pain. Pt has had bath, sheets changed. Pt also had CHG bath. Pt signed consent form. Pt is now NPO for surgery tomorrow.  Pt resting with eyes closed.     Problem: Education: Goal: Knowledge of General Education information will improve Description: Including pain rating scale, medication(s)/side effects and non-pharmacologic comfort measures Outcome: Progressing   Problem: Health Behavior/Discharge Planning: Goal: Ability to manage health-related needs will improve Outcome: Progressing   Problem: Clinical Measurements: Goal: Ability to maintain clinical measurements within normal limits will improve Outcome: Progressing Goal: Will remain free from infection Outcome: Progressing Goal: Diagnostic test results will improve Outcome: Progressing Goal: Respiratory complications will improve Outcome: Progressing Goal: Cardiovascular complication will be avoided Outcome: Progressing   Problem: Activity: Goal: Risk for activity intolerance will decrease Outcome: Progressing   Problem: Nutrition: Goal: Adequate nutrition will be maintained Outcome: Progressing   Problem: Coping: Goal: Level of anxiety will decrease Outcome: Progressing   Problem: Elimination: Goal: Will not experience complications related to bowel motility Outcome: Progressing Goal: Will not experience complications related to urinary retention Outcome: Progressing   Problem: Pain Managment: Goal: General experience of comfort will improve Outcome: Progressing   Problem: Safety: Goal: Ability to remain free from injury will improve Outcome: Progressing   Problem: Skin Integrity: Goal: Risk for impaired skin integrity will decrease Outcome: Progressing

## 2020-10-14 NOTE — Progress Notes (Signed)
Subjective:   O/N Events: None  Patient was seen this morning during bedside rounds.  Patient states that he feels good and does not remember having surgery this morning.  He has no complaints and is requesting a lozenge.  Objective:  Vital signs in last 24 hours: Vitals:   10/13/20 2033 10/14/20 0013 10/14/20 0340 10/14/20 0730  BP: 137/79 (!) 121/57 125/65 122/68  Pulse: 72 65 69 67  Resp: 18 17 19 16   Temp: 99.2 F (37.3 C) 98.8 F (37.1 C) 98.2 F (36.8 C) 98.7 F (37.1 C)  TempSrc: Oral Oral Oral Oral  SpO2: 98% 99% 100% 100%  Weight:      Height:       CBC Latest Ref Rng & Units 10/14/2020 10/13/2020 10/12/2020  WBC 4.0 - 10.5 K/uL 10.3 12.3(H) 11.5(H)  Hemoglobin 13.0 - 17.0 g/dL 7.6(L) 8.0(L) 8.2(L)  Hematocrit 39.0 - 52.0 % 24.5(L) 26.0(L) 26.0(L)  Platelets 150 - 400 K/uL 174 144(L) 170    BMP Latest Ref Rng & Units 10/14/2020 10/13/2020 10/12/2020  Glucose 70 - 99 mg/dL 108(H) 116(H) 117(H)  BUN 8 - 23 mg/dL 48(H) 36(H) 53(H)  Creatinine 0.61 - 1.24 mg/dL 7.69(H) 6.25(H) 7.58(H)  BUN/Creat Ratio 10 - 24 - - -  Sodium 135 - 145 mmol/L 133(L) 134(L) 131(L)  Potassium 3.5 - 5.1 mmol/L 3.9 3.7 3.5  Chloride 98 - 111 mmol/L 98 98 97(L)  CO2 22 - 32 mmol/L 25 25 25   Calcium 8.9 - 10.3 mg/dL 6.9(L) 7.4(L) 6.3(LL)     Physical Exam Constitutional:      General: He is not in acute distress.    Appearance: Normal appearance. He is not ill-appearing, toxic-appearing or diaphoretic.     Comments: Resting comfortably in bed, answers questions appropriately, no acute distress  HENT:     Head:     Comments: R surgical site dressing is clean, dry, and intact Cardiovascular:     Rate and Rhythm: Normal rate and regular rhythm.     Pulses: Normal pulses.     Heart sounds: Normal heart sounds. No murmur heard.   No friction rub. No gallop.  Pulmonary:     Effort: Pulmonary effort is normal.     Breath sounds: Normal breath sounds. No wheezing, rhonchi or rales.   Musculoskeletal:     Right lower leg: No edema.     Left lower leg: No edema.     Comments:    Skin:    General: Skin is warm and dry.  Neurological:     Mental Status: He is alert and oriented to person, place, and time.     Comments: Cranial nerves are recently intact. Decreased sensation to right thigh compared to contralateral side.  Psychiatric:        Mood and Affect: Mood normal.        Behavior: Behavior normal.     Assessment/Plan:  Active Problems:   Subdural hematoma   Altered mental status   ABLA (acute blood loss anemia)   Stage 4 chronic kidney disease (HCC)   S/P craniotomy  Pearlean Brownie is a 65 y/o M with a PMHx HTN, ETOH Abuse, cocaine abuse, DM, and ESRD w/kidney transplant, who presented for AMS and was found to have a SDH and admitted for Craniotomy and evacuation.   Subacute R SDH now S/P R Craniotomy and Evacuation:  Craniotomy and evacuation on 10/05/2020 and status post repeat craniotomy for evacuation of recurrent subdural hematoma on 10/14/2020.  He is doing well postoperatively at this time. Appears to be a candidate for CIR. - Appreciate neurosurgery recommendations - Continue Frequent neuro checks - Continue keppra 250 mg BID for the next 5 days per neurosurgery  Hx CKD S/P Renal Transplant  w/ Progressive ESRD Being followed by Nephrology, receiving iHD M/W/F, will postpone HD until tomorrow per surgery, then resume normal MWF schedule.  CLIP process: [facility] MWF 6 am (patient will need to go the day prior to complete paperwork). - Continue mycophenolate and tacro per nephrology - Continue BP control  - HD per nephrology - Trend BMP   HTN:   - Amlodipine 5 mg daily - Labetolol 300 mg TID - PRN hydralazine   DM:  - Continue SSI   Acute Blood Loss Anemia Platelet Dysfunction in the Setting of Uremia:  Patient s/p 1 PRBC 9/28 - Trend CBC - Transfuse for Hgb < 7    Hx Polysubstance Abuse:  - Will need substance abuse counseling    Prior to Admission Living Arrangement: Home Anticipated Discharge Location: Appropriate candidate for CIR Barriers to Discharge: Repeat craniotomy by NSG, 10/7 Dispo: Anticipated discharge pending surgery and other medical workup  Orvis Brill, MD 10/14/2020, 8:37 AM After 5pm on weekdays and 1pm on weekends: On Call pager [phone]

## 2020-10-15 ENCOUNTER — Inpatient Hospital Stay (HOSPITAL_COMMUNITY): Payer: Medicaid Other

## 2020-10-15 DIAGNOSIS — R4182 Altered mental status, unspecified: Secondary | ICD-10-CM | POA: Diagnosis not present

## 2020-10-15 DIAGNOSIS — N184 Chronic kidney disease, stage 4 (severe): Secondary | ICD-10-CM | POA: Diagnosis not present

## 2020-10-15 LAB — CBC
HCT: 25.3 % — ABNORMAL LOW (ref 39.0–52.0)
Hemoglobin: 7.9 g/dL — ABNORMAL LOW (ref 13.0–17.0)
MCH: 27.5 pg (ref 26.0–34.0)
MCHC: 31.2 g/dL (ref 30.0–36.0)
MCV: 88.2 fL (ref 80.0–100.0)
Platelets: 220 10*3/uL (ref 150–400)
RBC: 2.87 MIL/uL — ABNORMAL LOW (ref 4.22–5.81)
RDW: 16.3 % — ABNORMAL HIGH (ref 11.5–15.5)
WBC: 11.3 10*3/uL — ABNORMAL HIGH (ref 4.0–10.5)
nRBC: 0 % (ref 0.0–0.2)

## 2020-10-15 LAB — GLUCOSE, CAPILLARY
Glucose-Capillary: 176 mg/dL — ABNORMAL HIGH (ref 70–99)
Glucose-Capillary: 111 mg/dL — ABNORMAL HIGH (ref 70–99)
Glucose-Capillary: 137 mg/dL — ABNORMAL HIGH (ref 70–99)
Glucose-Capillary: 135 mg/dL — ABNORMAL HIGH (ref 70–99)

## 2020-10-15 LAB — BASIC METABOLIC PANEL
Anion gap: 11 (ref 5–15)
BUN: 56 mg/dL — ABNORMAL HIGH (ref 8–23)
CO2: 22 mmol/L (ref 22–32)
Calcium: 6.9 mg/dL — ABNORMAL LOW (ref 8.9–10.3)
Chloride: 98 mmol/L (ref 98–111)
Creatinine, Ser: 8.63 mg/dL — ABNORMAL HIGH (ref 0.61–1.24)
GFR, Estimated: 6 mL/min — ABNORMAL LOW (ref >60)
Glucose, Bld: 156 mg/dL — ABNORMAL HIGH (ref 70–99)
Potassium: 4.4 mmol/L (ref 3.5–5.1)
Sodium: 131 mmol/L — ABNORMAL LOW (ref 135–145)

## 2020-10-15 NOTE — Progress Notes (Signed)
Subjective: Patient reports mild head soreness, otherwise doing well  Objective: Vital signs in last 24 hours: Temp:  [97 F (36.1 C)-98.4 F (36.9 C)] 98.4 F (36.9 C) (10/08 0400) Pulse Rate:  [61-78] 64 (10/08 0800) Resp:  [11-19] 15 (10/08 0800) BP: (122-163)/(64-88) 145/79 (10/08 0800) SpO2:  [93 %-100 %] 98 % (10/08 0800)  Intake/Output from previous day: 10/07 0701 - 10/08 0700 In: 954.8 [I.V.:754.5; IV Piggyback:100.4] Out: 1519 [Urine:1385; Drains:134] Intake/Output this shift: Total I/O In: 50 [P.O.:50] Out: -   Neurologic: Grossly normal  Lab Results: Lab Results  Component Value Date   WBC 11.3 (H) 10/15/2020   HGB 7.9 (L) 10/15/2020   HCT 25.3 (L) 10/15/2020   MCV 88.2 10/15/2020   PLT 220 10/15/2020   Lab Results  Component Value Date   INR 1.1 10/14/2020   BMET Lab Results  Component Value Date   NA 131 (L) 10/15/2020   K 4.4 10/15/2020   CL 98 10/15/2020   CO2 22 10/15/2020   GLUCOSE 156 (H) 10/15/2020   BUN 56 (H) 10/15/2020   CREATININE 8.63 (H) 10/15/2020   CALCIUM 6.9 (L) 10/15/2020    Studies/Results: CT HEAD WO CONTRAST  Result Date: 10/15/2020 CLINICAL DATA:  65 year old male postoperative day 1 subdural drain placement for recurrent right side subdural hematoma. EXAM: CT HEAD WITHOUT CONTRAST TECHNIQUE: Contiguous axial images were obtained from the base of the skull through the vertex without intravenous contrast. COMPARISON:  Head CT 10/12/2020, 10/05/2020. FINDINGS: Brain: Right side subdural drain now in place. Increased right side pneumocephalus but decreased volume of mixed density subdural hematoma since 10/12/2020. Decreased leftward midline shift from up to 10 mm at the anterior septum pellucidum previously now 4-5 mm. Improved patency of the right lateral ventricle. No ventriculomegaly. No cortically based acute infarct identified. Basilar cisterns remain patent. Vascular: Mild Calcified atherosclerosis at the skull base. Skull:  Right side craniotomy.  No new osseous abnormality. Sinuses/Orbits: Visualized paranasal sinuses and mastoids are stable and well aerated. Other: Postoperative changes to the right scalp. Orbits soft tissues remain negative. IMPRESSION: 1. Right side subdural placed with decreased volume of mixed density right subdural hematoma since 10/12/2020. Mild subsequent pneumocephalus. Leftward midline shift decreased from up to 10 mm to 4 mm. Improved patency of the right lateral ventricle. 2. No new intracranial abnormality. Electronically Signed   By: Genevie Ann M.D.   On: 10/15/2020 08:26    Assessment/Plan: Doing really well.  Follow-up head CT looks really good.  Subdural drain with minimal output and based on the CT scan this is expected.  JP drain is subgaleal.  He is doing well.  Will mobilize.  PT OT.  Continue Keppra.  Remove Foley and A-line  Estimated body mass index is 29.93 kg/m as calculated from the following:   Height as of this encounter: 6\' 1"  (1.854 m).   Weight as of this encounter: 102.9 kg.    LOS: 10 days    Christopher Chapman 10/15/2020, 8:35 AM

## 2020-10-15 NOTE — Progress Notes (Signed)
Nephrology Follow-Up Consult note   Assessment/Recommendations: Christopher Chapman is a/an 65 y.o. 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 Creatinine was already in high 5's range in 03/17/20 and he was counseled that he was nearing ESRD again but missed his f/u appt 2 mths later. He is almost certainly ESRD again now; h/o noncompliance and currently with poor social support thus may not be a candidate for another transplant. -Plan for dialysis today in ICU.  At some point prior to d/c will need to get back on MWF schedule - CLIP process (renal navigator notified) --> [facility] MWF 6am set up, needs to go day prior for paperwork - Dose medications for ESRD and avoid nephrotoxic agents as able given he still has some residual renal function. - Cut Tac in half to 2mg  BID but overall continue immunosuppressives for now until everything is sorted out but appears that he may not have the social support for another transplant. Therefore alloimmunization may be less of a concern.  Will wean off in outpt setting.    Renal osteodystrophy -phosphorus 8.8. Started sevelamer. PTH 440. Calcium low. 3 Ca bath and calcitriol 0.5 MWF for now. Anemia of CKD: Iron saturation 30 with ferritin of 712.  Hemoglobin 7-8s. Started aranesp 28mcg with dialysis 10/3. HTN - BP now improved. Watch with HD. SDH - s/p evacuation 9/28. Reaccumulated s/p 2nd surgery 10/7 H/o polysubstance abuse  Dispo: looks like he's going to CIR next.  Has outpt HD set up MWF when he discharges.   [facility] 10/15/2020 11:57 AM  ___________________________________________________________  CC: ESRD  Interval  History/Subjective:  doing well post op 2nd SDH hematoma evacuation 10/7.  Drains remain in place.  For HD today moved from yesterday.  UOP good.    Medications:  Current Facility-Administered Medications  Medication Dose Route Frequency Provider Last Rate Last Admin   0.9 %  sodium chloride infusion  100 mL Intravenous PRN [name], MD       0.9 %  sodium chloride infusion  100 mL Intravenous PRN [name], MD       acetaminophen (TYLENOL) tablet 650 mg  650 mg Oral Q4H PRN [name], MD       Or   acetaminophen (TYLENOL) suppository 650 mg  650 mg Rectal Q4H PRN [name], MD       allopurinol (ZYLOPRIM) tablet 200 mg  200 mg Oral Daily [name], MD   200 mg at 10/15/20 2956   alteplase (CATHFLO ACTIVASE) injection 2 mg  2 mg Intracatheter Once PRN [name], MD       amLODipine (NORVASC) tablet 5 mg  5 mg Oral Daily [name], MD   5 mg at 10/15/20 2130   calcitRIOL (ROCALTROL) capsule 0.5 mcg  0.5 mcg Oral Q M,W,F-HD [name], MD   0.5 mcg at 10/12/20 1227   Chlorhexidine Gluconate Cloth 2 % PADS 6 each  6 each Topical Daily [name], MD   6 each at 10/15/20 1128   Darbepoetin Alfa (ARANESP) injection 60 mcg  60 mcg Intravenous Q Mon-HD [name], MD   60 mcg at 10/10/20 1831  docusate sodium (COLACE) capsule 100 mg  100 mg Oral BID PRN [name], MD       hydrALAZINE (APRESOLINE) injection 10 mg  10 mg Intravenous Q4H PRN [name], MD       HYDROcodone-acetaminophen (NORCO/VICODIN) 5-325 MG per tablet 1 tablet  1 tablet Oral Q4H PRN [name], MD   1 tablet at 10/14/20 1347   insulin aspart (novoLOG) injection 0-15 Units  0-15 Units Subcutaneous TID WC [name], MD   3 Units at 10/15/20 0846   labetalol (NORMODYNE) injection 10-40 mg  10-40 mg Intravenous Q10 min PRN [name], MD       labetalol (NORMODYNE) tablet 300 mg  300 mg Oral TID [name], MD   300 mg at 10/15/20 4098   levETIRAcetam (KEPPRA)  tablet 250 mg  250 mg Oral BID [name], MD   250 mg at 10/15/20 1191   lidocaine (PF) (XYLOCAINE) 1 % injection 5 mL  5 mL Intradermal PRN [name], MD       lidocaine-prilocaine (EMLA) cream 1 application  1 application Topical PRN [name], MD       losartan (COZAAR) tablet 25 mg  25 mg Oral Daily [name], MD   25 mg at 10/15/20 4782   menthol-cetylpyridinium (CEPACOL) lozenge 3 mg  1 lozenge Oral PRN Lucious Groves, DO       morphine 2 MG/ML injection 1-2 mg  1-2 mg Intravenous Q2H PRN [name], MD   2 mg at 10/14/20 1601   mycophenolate (MYFORTIC) EC tablet 360 mg  360 mg Oral BID [name], MD   360 mg at 10/15/20 0923   ondansetron (ZOFRAN) tablet 4 mg  4 mg Oral Q4H PRN [name], MD       Or   ondansetron [facility]) injection 4 mg  4 mg Intravenous Q4H PRN [name], MD   4 mg at 10/06/20 1212   pantoprazole (PROTONIX) EC tablet 40 mg  40 mg Oral QHS [name], MD   40 mg at 10/14/20 2116   pentafluoroprop-tetrafluoroeth (GEBAUERS) aerosol 1 application  1 application Topical PRN [name], MD       senna [facility]) tablet 8.6 mg  1 tablet Oral BID [name], MD   8.6 mg at 10/15/20 9562   sevelamer carbonate (RENVELA) tablet 800 mg  800 mg Oral TID WC [name], MD   800 mg at 10/15/20 0845   tacrolimus (PROGRAF) capsule 2 mg  2 mg Oral BID [name], MD   2 mg at 10/15/20 1308      Review of Systems: 10 systems reviewed and negative except per interval history/subjective  Physical Exam: Vitals:   10/15/20 1000 10/15/20 1100  BP: 134/79 (!) 149/73  Pulse: 76 69  Resp: 17 11  Temp:    SpO2: 100% 100%   Total I/O In: 160 [P.O.:160] Out: 50 [Urine:50]  Intake/Output Summary (Last 24 hours) at 10/15/2020 1157 Last data filed at 10/15/2020 1100 Gross per 24 hour  Intake 364.83 ml  Output 1269 ml  Net -904.17 ml    Constitutional: sitting in chair, JP and ventricular drains in place ENMT: MMM CV: normal  rate Respiratory: Bilateral chest rise, normal work of breathing with a few scattered rhonchi, no rales Gastrointestinal: soft, non-tender Skin: no visible lesions or rashes Psych: alert, calm Dialysis access: LUE AVF +T/b  Extr: no edema   Test Results I personally reviewed new and old clinical labs and radiology tests Lab Results  Component Value Date   NA 131 (L) 10/15/2020   K 4.4 10/15/2020   CL 98 10/15/2020   CO2 22 10/15/2020   BUN 56 (H) 10/15/2020   CREATININE 8.63 (H) 10/15/2020   CALCIUM 6.9 (L) 10/15/2020   ALBUMIN 2.6 (L) 10/07/2020   PHOS 8.8 (H) 10/06/2020

## 2020-10-15 NOTE — Progress Notes (Signed)
Subjective:   Patient evaluated bedside.  States he is doing well.  Throat is feeling better and lozenges are helping.  He has some head pain near site of the surgical drain.  Otherwise eating and drinking well.  Dysuria or constipation fevers or chills.  Objective:  Vital signs in last 24 hours: Vitals:   10/15/20 0200 10/15/20 0300 10/15/20 0400 10/15/20 0500  BP: 135/79 (!) 156/77 (!) 148/82 137/68  Pulse: 72 69 72 69  Resp: 18 15 16 16   Temp:   98.4 F (36.9 C)   TempSrc:   Oral   SpO2: 99% 100% 93% 94%  Weight:      Height:       CBC Latest Ref Rng & Units 10/15/2020 10/14/2020 10/13/2020  WBC 4.0 - 10.5 K/uL 11.3(H) 10.3 12.3(H)  Hemoglobin 13.0 - 17.0 g/dL 7.9(L) 7.6(L) 8.0(L)  Hematocrit 39.0 - 52.0 % 25.3(L) 24.5(L) 26.0(L)  Platelets 150 - 400 K/uL 220 174 144(L)    BMP Latest Ref Rng & Units 10/15/2020 10/14/2020 10/13/2020  Glucose 70 - 99 mg/dL 156(H) 108(H) 116(H)  BUN 8 - 23 mg/dL 56(H) 48(H) 36(H)  Creatinine 0.61 - 1.24 mg/dL 8.63(H) 7.69(H) 6.25(H)  BUN/Creat Ratio 10 - 24 - - -  Sodium 135 - 145 mmol/L 131(L) 133(L) 134(L)  Potassium 3.5 - 5.1 mmol/L 4.4 3.9 3.7  Chloride 98 - 111 mmol/L 98 98 98  CO2 22 - 32 mmol/L 22 25 25   Calcium 8.9 - 10.3 mg/dL 6.9(L) 6.9(L) 7.4(L)     Physical Exam Constitutional:      General: He is not in acute distress.    Appearance: Normal appearance.  HENT:     Head:     Comments: Subgaleal JP drain with serosanguineous fluid, surgical incision clean dressed.    Mouth/Throat:     Mouth: Mucous membranes are moist.     Pharynx: Oropharynx is clear.  Eyes:     Extraocular Movements: Extraocular movements intact.     Pupils: Pupils are equal, round, and reactive to light.  Cardiovascular:     Rate and Rhythm: Normal rate and regular rhythm.     Pulses: Normal pulses.     Heart sounds: Normal heart sounds. No murmur heard.   No friction rub. No gallop.  Pulmonary:     Effort: Pulmonary effort is normal.     Breath  sounds: Normal breath sounds. No wheezing, rhonchi or rales.  Abdominal:     General: Abdomen is flat. Bowel sounds are normal. There is no distension.     Palpations: Abdomen is soft.     Tenderness: There is no abdominal tenderness.  Musculoskeletal:     Right lower leg: No edema.     Left lower leg: No edema.     Comments:    Skin:    General: Skin is warm and dry.  Neurological:     Mental Status: He is alert and oriented to person, place, and time.     Comments: Cranial nerves are recently intact. Decreased sensation to right thigh compared to contralateral side.  Psychiatric:        Mood and Affect: Mood normal.        Behavior: Behavior normal.    Assessment/Plan:  Active Problems:   Subdural hematoma   Altered mental status   ABLA (acute blood loss anemia)   Stage 4 chronic kidney disease (HCC)   S/P craniotomy  Pearlean Brownie is a 65 y/o M with a PMHx  HTN, ETOH Abuse, cocaine abuse, DM, and ESRD w/kidney transplant, who presented for AMS and was found to have a SDH underwent craniotomy with evacuation 4-25 complicated by reaccumulation and repeat drainage on 10/7.   Subacute R SDH now S/P R Craniotomy and Evacuation:  Patient doing well this morning.  He is in good spirits.  Slight pain near surgical site.  CT head with decreased volume of SDH and improved midline shift. - Appreciate neurosurgery recommendations - Continue Frequent neuro checks - Continue keppra 250 mg -Continue PT OT while admitted, anticipated discharge to CIR  Hx CKD S/P Renal Transplant  w/ Progressive ESRD Being followed by Nephrology, receiving iHD M/W/F, likely dialysis today as he had surgery yesterday.  CLIP process: [facility] MWF 6 am (patient will need to go the day prior to complete paperwork). - Continue mycophenolate and tacro per nephrology - Continue BP control  - HD per nephrology -Avoid nephrotoxins   HTN:   - Amlodipine 5 mg daily - Labetolol 300 mg TID - PRN hydralazine   DM:   - Continue SSI   Acute Blood Loss Anemia Platelet Dysfunction in the Setting of Uremia:  Patient s/p 1 PRBC 9/28 - Trend CBC - Transfuse for Hgb < 7    Hx Polysubstance Abuse:  - Will need substance abuse counseling   Prior to Admission Living Arrangement: Home Anticipated Discharge Location: Appropriate candidate for CIR Barriers to Discharge: Continued medial and neurosurgical treatment Dispo: Anticipated discharge pending surgery and other medical workup  Iona Beard, MD 10/15/2020, 6:21 AM After 5pm on weekdays and 1pm on weekends: On Call pager [phone]

## 2020-10-15 NOTE — Evaluation (Signed)
Physical Therapy Re-Evaluation Patient Details Name: Christopher Chapman MRN: [id number] DOB: February 18, 1955 Today's Date: 10/15/2020  History of Present Illness  This 65 y.o. male admitted after being found down for unknown amount of time. He was found to have large rt sided chronic SDH with 1.7 cm midline shift and mass effect. He underwent craniotomy for evacuation on 9/28. Pt to start HD. Head CT 10/5 showing increased right-to-left midline shift, now measuring up to 1.4 cm, with increased effacement of the lateral ventricles, now s/p re-do craniotomy 10/14/2020. PMH includes: Chronic renal failure, ESRD, gout, h/o cocaine abuse, HTN.  Clinical Impression  Pt re-evaluated s/p re-do craniotomy 10/14/2020. Pt demonstrates marked improvement in left sided strength, balance and gait. He verbalizes good pain control. Pt ambulating limited room distances with a walker and min guard (+2 safety/equipment). He continues to present as a high fall risk based on decreased gait speed and safety awareness. Pt would continue to benefit from CIR to address deficits and maximize functional mobility.      Recommendations for follow up therapy are one component of a multi-disciplinary discharge planning process, led by the attending physician.  Recommendations may be updated based on patient status, additional functional criteria and insurance authorization.  Follow Up Recommendations CIR    Equipment Recommendations  Rolling walker with 5" wheels    Recommendations for Other Services       Precautions / Restrictions Precautions Precautions: Fall;Other (comment) Precaution Comments: Subdural drain, JP bulb Restrictions Weight Bearing Restrictions: No      Mobility  Bed Mobility Overal bed mobility: Needs Assistance Bed Mobility: Supine to Sit     Supine to sit: Supervision     General bed mobility comments: close supervision    Transfers Overall transfer level: Needs assistance Equipment used:  Rolling walker (2 wheeled) Transfers: Sit to/from Stand Sit to Stand: Supervision         General transfer comment: Close supervision for safety, pt preferring for PT to "back up so he could do it for himself." Use of momentum to power up  Ambulation/Gait Ambulation/Gait assistance: Min guard;+2 safety/equipment Gait Distance (Feet): 20 Feet Assistive device: Rolling walker (2 wheeled) Gait Pattern/deviations: Decreased stride length;Step-to pattern;Step-through pattern     General Gait Details: slow but steady pace, min guard +2 safety, improved left foot clearance  Stairs            Wheelchair Mobility    Modified Rankin (Stroke Patients Only) Modified Rankin (Stroke Patients Only) Pre-Morbid Rankin Score: No significant disability Modified Rankin: Moderately severe disability     Balance Overall balance assessment: Needs assistance Sitting-balance support: Feet supported Sitting balance-Leahy Scale: Good     Standing balance support: Bilateral upper extremity supported Standing balance-Leahy Scale: Poor Standing balance comment: reliant on RW                             Pertinent Vitals/Pain Pain Assessment: Faces Faces Pain Scale: Hurts little more Pain Location: head Pain Descriptors / Indicators: Grimacing Pain Intervention(s): Monitored during session;Limited activity within patient's tolerance    Home Living Family/patient expects to be discharged to:: Private residence Living Arrangements: Parent;Other relatives Available Help at Discharge: Available PRN/intermittently Type of Home: House       Home Layout: Multi-level        Prior Function Level of Independence: Independent         Comments: Pt denies h/o falls.  he does not use AD  for ambulation.  He reports he drives, and is a retired Manufacturing systems engineer of all trades, Restaurant manager, fast food of none"     Journalist, newspaper        Extremity/Trunk Assessment   Upper Extremity Assessment Upper  Extremity Assessment: Defer to OT evaluation    Lower Extremity Assessment Lower Extremity Assessment: LLE deficits/detail LLE Deficits / Details: Hip flexion 4/5, knee extension and ankle dorsiflexion 5/5       Communication      Cognition Arousal/Alertness: Awake/alert Behavior During Therapy: Impulsive Overall Cognitive Status: Impaired/Different from baseline Area of Impairment: Memory;Following commands;Safety/judgement;Awareness;Problem solving                     Memory: Decreased recall of precautions Following Commands: Follows multi-step commands inconsistently Safety/Judgement: Decreased awareness of safety;Decreased awareness of deficits Awareness: Intellectual Problem Solving: Requires verbal cues General Comments: Pt continues with decreased insight into deficits/safety, repeatedly asking if he could now get up by himself despite instruction he could not with the subdural drain.      General Comments      Exercises     Assessment/Plan    PT Assessment Patient needs continued PT services  PT Problem List Decreased strength;Decreased activity tolerance;Decreased balance;Decreased mobility;Decreased coordination;Decreased cognition;Decreased safety awareness;Decreased knowledge of use of DME;Decreased knowledge of precautions;Impaired sensation       PT Treatment Interventions DME instruction;Gait training;Stair training;Therapeutic activities;Therapeutic exercise;Functional mobility training;Balance training;Neuromuscular re-education;Patient/family education;Wheelchair mobility training    PT Goals (Current goals can be found in the Care Plan section)  Acute Rehab PT Goals Patient Stated Goal: pt agreeable to rehab PT Goal Formulation: With patient Time For Goal Achievement: 10/29/20 Potential to Achieve Goals: Good    Frequency Min 4X/week   Barriers to discharge        Co-evaluation               AM-PAC PT "6 Clicks" Mobility   Outcome Measure Help needed turning from your back to your side while in a flat bed without using bedrails?: A Little Help needed moving from lying on your back to sitting on the side of a flat bed without using bedrails?: A Little Help needed moving to and from a bed to a chair (including a wheelchair)?: A Little Help needed standing up from a chair using your arms (e.g., wheelchair or bedside chair)?: A Little Help needed to walk in hospital room?: A Little Help needed climbing 3-5 steps with a railing? : A Lot 6 Click Score: 17    End of Session Equipment Utilized During Treatment: Gait belt Activity Tolerance: Patient tolerated treatment well Patient left: in chair;with call bell/phone within reach;with chair alarm set Nurse Communication: Mobility status PT Visit Diagnosis: Unsteadiness on feet (R26.81);Muscle weakness (generalized) (M62.81);Other abnormalities of gait and mobility (R26.89);Difficulty in walking, not elsewhere classified (R26.2);Other symptoms and signs involving the nervous system (R29.898);Hemiplegia and hemiparesis Hemiplegia - Right/Left: Left Hemiplegia - dominant/non-dominant: Non-dominant    Time: 2355-7322 PT Time Calculation (min) (ACUTE ONLY): 31 min   Charges:   PT Evaluation $PT Re-evaluation: 1 Re-eval PT Treatments $Therapeutic Activity: 8-22 mins        Wyona Almas, PT, DPT Acute Rehabilitation Services Pager [phone] Office [place] 10/15/2020, 1:01 PM

## 2020-10-15 NOTE — Progress Notes (Signed)
Patient's closed system drain to gravity has not put out output for several hours. Patient's neuro status has remained stable. Dr. Reatha Armour paged.

## 2020-10-16 DIAGNOSIS — N184 Chronic kidney disease, stage 4 (severe): Secondary | ICD-10-CM | POA: Diagnosis not present

## 2020-10-16 DIAGNOSIS — R4182 Altered mental status, unspecified: Secondary | ICD-10-CM | POA: Diagnosis not present

## 2020-10-16 LAB — BASIC METABOLIC PANEL
Anion gap: 13 (ref 5–15)
BUN: 63 mg/dL — ABNORMAL HIGH (ref 8–23)
CO2: 23 mmol/L (ref 22–32)
Calcium: 6.7 mg/dL — ABNORMAL LOW (ref 8.9–10.3)
Chloride: 98 mmol/L (ref 98–111)
Creatinine, Ser: 9.84 mg/dL — ABNORMAL HIGH (ref 0.61–1.24)
GFR, Estimated: 5 mL/min — ABNORMAL LOW (ref >60)
Glucose, Bld: 100 mg/dL — ABNORMAL HIGH (ref 70–99)
Potassium: 4.1 mmol/L (ref 3.5–5.1)
Sodium: 134 mmol/L — ABNORMAL LOW (ref 135–145)

## 2020-10-16 LAB — CBC
HCT: 23.4 % — ABNORMAL LOW (ref 39.0–52.0)
Hemoglobin: 7.4 g/dL — ABNORMAL LOW (ref 13.0–17.0)
MCH: 28 pg (ref 26.0–34.0)
MCHC: 31.6 g/dL (ref 30.0–36.0)
MCV: 88.6 fL (ref 80.0–100.0)
Platelets: 197 10*3/uL (ref 150–400)
RBC: 2.64 MIL/uL — ABNORMAL LOW (ref 4.22–5.81)
RDW: 16.4 % — ABNORMAL HIGH (ref 11.5–15.5)
WBC: 8.4 10*3/uL (ref 4.0–10.5)
nRBC: 0 % (ref 0.0–0.2)

## 2020-10-16 LAB — GLUCOSE, CAPILLARY
Glucose-Capillary: 121 mg/dL — ABNORMAL HIGH (ref 70–99)
Glucose-Capillary: 93 mg/dL (ref 70–99)

## 2020-10-16 NOTE — Progress Notes (Signed)
Subjective:   Patient evaluated bedside while receiving HD.  He does not endorse any specific complaints or concerns today.  Objective:  Vital signs in last 24 hours: Vitals:   10/16/20 0300 10/16/20 0400 10/16/20 0500 10/16/20 0600  BP: 128/76 126/75 136/74 138/81  Pulse: 68 68 61 64  Resp: (!) 21  13 17   Temp:  98 F (36.7 C)    TempSrc:  Oral    SpO2: 98% 95% 96% 96%  Weight:      Height:        Physical Exam Constitutional:      General: He is not in acute distress.    Appearance: Normal appearance.  HENT:     Head:     Comments: Subgaleal JP drain with serosanguineous fluid, surgical incision clean dressed.    Mouth/Throat:     Mouth: Mucous membranes are moist.     Pharynx: Oropharynx is clear.  Eyes:     Extraocular Movements: Extraocular movements intact.     Pupils: Pupils are equal, round, and reactive to light.  Cardiovascular:     Rate and Rhythm: Normal rate and regular rhythm.     Pulses: Normal pulses.     Heart sounds: Normal heart sounds. No murmur heard.   No friction rub. No gallop.  Pulmonary:     Effort: Pulmonary effort is normal.     Breath sounds: Normal breath sounds. No wheezing, rhonchi or rales.  Abdominal:     General: Abdomen is flat. Bowel sounds are normal. There is no distension.     Palpations: Abdomen is soft.     Tenderness: There is no abdominal tenderness.  Musculoskeletal:     Right lower leg: No edema.     Left lower leg: No edema.     Comments:    Skin:    General: Skin is warm and dry.  Neurological:     Mental Status: He is alert and oriented to person, place, and time.     Comments: Cranial nerves are grossly intact.  No new focal deficit.  Psychiatric:        Mood and Affect: Mood normal.        Behavior: Behavior normal.    Assessment/Plan:  Active Problems:   Subdural hematoma   Altered mental status   ABLA (acute blood loss anemia)   Stage 4 chronic kidney disease (HCC)   S/P craniotomy  Christopher Chapman is a 65 y/o M with a PMHx HTN, ETOH Abuse, cocaine abuse, DM, and ESRD w/kidney transplant, who presented for AMS and was found to have a SDH underwent craniotomy with evacuation 6-54 complicated by reaccumulation and repeat drainage on 10/7.   Subacute R SDH now S/P R Craniotomy and Evacuation:  Patient doing well this morning. He is in good spirits. CT head with decreased volume of SDH and improved midline shift. - Appreciate neurosurgery recommendations - Continue Frequent neuro checks - Continue keppra 250 mg - Continue PT OT while admitted, anticipated discharge to CIR  Hx CKD S/P Renal Transplant  w/ Progressive ESRD Being followed by Nephrology, receiving iHD M/W/F, receiving HD in the room today. CLIP process: [facility] MWF 6 am (patient will need to go the day prior to complete paperwork). - Continue mycophenolate and tacro per nephrology - Continue BP control  - HD per nephrology - Avoid nephrotoxins   HTN:   - Amlodipine 5 mg daily - Labetolol 300 mg TID - PRN hydralazine  DM:  -  Continue SSI   Acute Blood Loss Anemia Platelet Dysfunction in the Setting of Uremia:  Patient s/p 1 PRBC 9/28. - Trend CBC - Transfuse for Hgb < 7    Hx Polysubstance Abuse:  - Will need substance abuse counseling   Prior to Admission Living Arrangement: Home Anticipated Discharge Location: Appropriate candidate for CIR Barriers to Discharge: Continued medial and neurosurgical treatment Dispo: Anticipated discharge pending surgery and other medical workup  Christopher Brill, MD 10/16/2020, 7:10 AM After 5pm on weekdays and 1pm on weekends: On Call pager [phone]

## 2020-10-16 NOTE — Progress Notes (Signed)
JP drain and EVD drain removed by Dr. Reatha Armour at this bedside with a staple placed at each exit site. Patient tolerated well. Will continue to monitor.

## 2020-10-16 NOTE — Progress Notes (Signed)
Nephrology Follow-Up Consult note   Assessment/Recommendations: Christopher Chapman is a/an 65 y.o. 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 Creatinine was already in high 5's range in 03/17/20 and he was counseled that he was nearing ESRD again but missed his f/u appt 2 mths later. He is almost certainly ESRD again now; h/o noncompliance and currently with poor social support thus may not be a candidate for another transplant. -Finished HD early AM Sunday.  At some point prior to d/c will need to get back on MWF schedule but as not discharging soon can keep off schedule for now given high patient volume. - CLIP process (renal navigator notified) --> [facility] MWF 6am set up, needs to go day prior for paperwork - Dose medications for ESRD and avoid nephrotoxic agents as able given he still has some residual renal function. - Cut Tac in half to 2mg  BID but overall continue immunosuppressives for now until everything is sorted out but appears that he may not have the social support for another transplant. Therefore alloimmunization may be less of a concern.  Will wean off in outpt setting.    Renal osteodystrophy -phosphorus 8.8. Started sevelamer. PTH 440. Calcium low. 3 Ca bath and calcitriol 0.5 MWF for now. Anemia of CKD: Iron saturation 30 with ferritin of 712.  Hemoglobin 7-8s. Started aranesp 65mcg with dialysis 10/3. HTN - BP now improved. Watch with HD. SDH - s/p evacuation 9/28. Reaccumulated s/p 2nd surgery 10/7 H/o polysubstance abuse  Dispo: looks like he's going to CIR next.  Has outpt HD set up MWF when he discharges.   [facility] 10/16/2020 8:05  AM  ___________________________________________________________  CC: ESRD  Interval History/Subjective:  doing well post op day 3 SDH hematoma evacuation 10/7.  Drains remain in place.  Finished routine HD early this AM.  UOP good.    Medications:  Current Facility-Administered Medications  Medication Dose Route Frequency Provider Last Rate Last Admin   0.9 %  sodium chloride infusion  100 mL Intravenous PRN [name], MD       0.9 %  sodium chloride infusion  100 mL Intravenous PRN [name], MD       acetaminophen (TYLENOL) tablet 650 mg  650 mg Oral Q4H PRN [name], MD       Or   acetaminophen (TYLENOL) suppository 650 mg  650 mg Rectal Q4H PRN [name], MD       allopurinol (ZYLOPRIM) tablet 200 mg  200 mg Oral Daily [name], MD   200 mg at 10/15/20 3474   alteplase (CATHFLO ACTIVASE) injection 2 mg  2 mg Intracatheter Once PRN [name], MD       amLODipine (NORVASC) tablet 5 mg  5 mg Oral Daily [name], MD   5 mg at 10/15/20 2595   calcitRIOL (ROCALTROL) capsule 0.5 mcg  0.5 mcg Oral Q M,W,F-HD [name], MD   0.5 mcg at 10/12/20 1227   Chlorhexidine Gluconate Cloth 2 % PADS 6 each  6 each Topical Daily [name], MD   6 each at 10/15/20 1128   Darbepoetin Alfa (ARANESP) injection 60 mcg  60 mcg  Intravenous Q Mon-HD [name], MD   60 mcg at 10/10/20 1831   docusate sodium (COLACE) capsule 100 mg  100 mg Oral BID PRN [name], MD       hydrALAZINE (APRESOLINE) injection 10 mg  10 mg Intravenous Q4H PRN [name], MD       HYDROcodone-acetaminophen (NORCO/VICODIN) 5-325 MG per tablet 1 tablet  1 tablet Oral Q4H PRN [name], MD   1 tablet at 10/14/20 1347   insulin aspart (novoLOG) injection 0-15 Units  0-15 Units Subcutaneous TID WC [name], MD   2 Units at 10/15/20 1722   labetalol (NORMODYNE) injection 10-40 mg  10-40 mg Intravenous Q10 min PRN [name], MD       labetalol (NORMODYNE) tablet 300  mg  300 mg Oral TID [name], MD   300 mg at 10/15/20 2136   levETIRAcetam (KEPPRA) tablet 250 mg  250 mg Oral BID [name], MD   250 mg at 10/15/20 2137   lidocaine (PF) (XYLOCAINE) 1 % injection 5 mL  5 mL Intradermal PRN [name], MD       lidocaine-prilocaine (EMLA) cream 1 application  1 application Topical PRN [name], MD       losartan (COZAAR) tablet 25 mg  25 mg Oral Daily [name], MD   25 mg at 10/15/20 7672   menthol-cetylpyridinium (CEPACOL) lozenge 3 mg  1 lozenge Oral PRN Lucious Groves, DO       morphine 2 MG/ML injection 1-2 mg  1-2 mg Intravenous Q2H PRN [name], MD   2 mg at 10/14/20 1601   mycophenolate (MYFORTIC) EC tablet 360 mg  360 mg Oral BID [name], MD   360 mg at 10/15/20 2137   ondansetron (ZOFRAN) tablet 4 mg  4 mg Oral Q4H PRN [name], MD       Or   ondansetron [facility]) injection 4 mg  4 mg Intravenous Q4H PRN [name], MD   4 mg at 10/06/20 1212   pantoprazole (PROTONIX) EC tablet 40 mg  40 mg Oral QHS [name], MD   40 mg at 10/15/20 2136   pentafluoroprop-tetrafluoroeth (GEBAUERS) aerosol 1 application  1 application Topical PRN [name], MD       senna [facility]) tablet 8.6 mg  1 tablet Oral BID [name], MD   8.6 mg at 10/15/20 2136   sevelamer carbonate (RENVELA) tablet 800 mg  800 mg Oral TID WC [name], MD   800 mg at 10/15/20 1720   tacrolimus (PROGRAF) capsule 2 mg  2 mg Oral BID [name], MD   2 mg at 10/15/20 2138      Review of Systems: 10 systems reviewed and negative except per interval history/subjective  Physical Exam: Vitals:   10/16/20 0600 10/16/20 0700  BP: 138/81 (!) 143/81  Pulse: 64 65  Resp: 17 18  Temp:    SpO2: 96% 93%   No intake/output data recorded.  Intake/Output Summary (Last 24 hours) at 10/16/2020 0805 Last data filed at 10/16/2020 0700 Gross per 24 hour  Intake 390 ml  Output 1110 ml  Net -720 ml    Constitutional: sitting in  bed, JP and ventricular drains in place ENMT: MMM CV: normal rate Respiratory: Bilateral chest rise, normal work of breathing with a few scattered rhonchi, no rales Gastrointestinal: soft, non-tender Skin: no  visible lesions or rashes Psych: alert, calm Dialysis access: LUE AVF +T/b Extr: no edema   Test Results I personally reviewed new and old clinical labs and radiology tests Lab Results  Component Value Date   NA 134 (L) 10/16/2020   K 4.1 10/16/2020   CL 98 10/16/2020   CO2 23 10/16/2020   BUN 63 (H) 10/16/2020   CREATININE 9.84 (H) 10/16/2020   CALCIUM 6.7 (L) 10/16/2020   ALBUMIN 2.6 (L) 10/07/2020   PHOS 8.8 (H) 10/06/2020

## 2020-10-16 NOTE — Progress Notes (Signed)
   Providing Compassionate, Quality Care - Together  NEUROSURGERY PROGRESS NOTE   S: No issues overnight. Drains min output  O: EXAM:  BP (!) 143/63   Pulse 74   Temp 97.7 F (36.5 C) (Axillary)   Resp 13   Ht 6\' 1"  (1.854 m)   Wt 102.9 kg   SpO2 95%   BMI 29.93 kg/m   Awake, alert, oriented x3 PERRLA Speech fluent, appropriate  CNs grossly intact  5/5 BUE/BLE  Drains in place Incision c.d.i  ASSESSMENT:  65 y.o. male with   SDH, recurrent  PLAN: - dc drains -doing well     Thank you for allowing me to participate in this patient's care.  Please do not hesitate to call with questions or concerns.   Elwin Sleight, [facility] Cell: [phone]

## 2020-10-17 ENCOUNTER — Other Ambulatory Visit: Payer: Self-pay

## 2020-10-17 ENCOUNTER — Inpatient Hospital Stay (HOSPITAL_COMMUNITY)
Admission: RE | Admit: 2020-10-17 | Discharge: 2020-10-27 | DRG: 945 | Disposition: A | Discharge status: Home / Self Care | Payer: Medicaid Other | Source: Intra-hospital | Attending: Physical Medicine & Rehabilitation | Admitting: Physical Medicine & Rehabilitation

## 2020-10-17 ENCOUNTER — Encounter (HOSPITAL_COMMUNITY): Payer: Self-pay | Admitting: Neurological Surgery

## 2020-10-17 DIAGNOSIS — Z8614 Personal history of Methicillin resistant Staphylococcus aureus infection: Secondary | ICD-10-CM | POA: Diagnosis not present

## 2020-10-17 DIAGNOSIS — S065XAD Traumatic subdural hemorrhage with loss of consciousness status unknown, subsequent encounter: Principal | ICD-10-CM

## 2020-10-17 DIAGNOSIS — R4189 Other symptoms and signs involving cognitive functions and awareness: Secondary | ICD-10-CM | POA: Diagnosis present

## 2020-10-17 DIAGNOSIS — T8612 Kidney transplant failure: Secondary | ICD-10-CM | POA: Diagnosis present

## 2020-10-17 DIAGNOSIS — Z8249 Family history of ischemic heart disease and other diseases of the circulatory system: Secondary | ICD-10-CM | POA: Diagnosis not present

## 2020-10-17 DIAGNOSIS — M898X9 Other specified disorders of bone, unspecified site: Secondary | ICD-10-CM | POA: Diagnosis present

## 2020-10-17 DIAGNOSIS — S065X0A Traumatic subdural hemorrhage without loss of consciousness, initial encounter: Secondary | ICD-10-CM

## 2020-10-17 DIAGNOSIS — X58XXXD Exposure to other specified factors, subsequent encounter: Secondary | ICD-10-CM | POA: Diagnosis present

## 2020-10-17 DIAGNOSIS — Z7982 Long term (current) use of aspirin: Secondary | ICD-10-CM | POA: Diagnosis not present

## 2020-10-17 DIAGNOSIS — Z992 Dependence on renal dialysis: Secondary | ICD-10-CM | POA: Diagnosis not present

## 2020-10-17 DIAGNOSIS — S065XAA Traumatic subdural hemorrhage with loss of consciousness status unknown, initial encounter: Secondary | ICD-10-CM | POA: Diagnosis not present

## 2020-10-17 DIAGNOSIS — R7303 Prediabetes: Secondary | ICD-10-CM

## 2020-10-17 DIAGNOSIS — D649 Anemia, unspecified: Secondary | ICD-10-CM

## 2020-10-17 DIAGNOSIS — Z833 Family history of diabetes mellitus: Secondary | ICD-10-CM

## 2020-10-17 DIAGNOSIS — I10 Essential (primary) hypertension: Secondary | ICD-10-CM

## 2020-10-17 DIAGNOSIS — R609 Edema, unspecified: Secondary | ICD-10-CM | POA: Diagnosis not present

## 2020-10-17 DIAGNOSIS — Z8611 Personal history of tuberculosis: Secondary | ICD-10-CM | POA: Diagnosis not present

## 2020-10-17 DIAGNOSIS — N184 Chronic kidney disease, stage 4 (severe): Secondary | ICD-10-CM | POA: Diagnosis not present

## 2020-10-17 DIAGNOSIS — F17210 Nicotine dependence, cigarettes, uncomplicated: Secondary | ICD-10-CM | POA: Diagnosis present

## 2020-10-17 DIAGNOSIS — F1910 Other psychoactive substance abuse, uncomplicated: Secondary | ICD-10-CM | POA: Diagnosis not present

## 2020-10-17 DIAGNOSIS — M109 Gout, unspecified: Secondary | ICD-10-CM | POA: Diagnosis present

## 2020-10-17 DIAGNOSIS — Y830 Surgical operation with transplant of whole organ as the cause of abnormal reaction of the patient, or of later complication, without mention of misadventure at the time of the procedure: Secondary | ICD-10-CM | POA: Diagnosis present

## 2020-10-17 DIAGNOSIS — R4182 Altered mental status, unspecified: Secondary | ICD-10-CM | POA: Diagnosis not present

## 2020-10-17 DIAGNOSIS — Z79899 Other long term (current) drug therapy: Secondary | ICD-10-CM | POA: Diagnosis not present

## 2020-10-17 DIAGNOSIS — N186 End stage renal disease: Secondary | ICD-10-CM | POA: Diagnosis present

## 2020-10-17 DIAGNOSIS — I120 Hypertensive chronic kidney disease with stage 5 chronic kidney disease or end stage renal disease: Secondary | ICD-10-CM | POA: Diagnosis present

## 2020-10-17 DIAGNOSIS — E785 Hyperlipidemia, unspecified: Secondary | ICD-10-CM | POA: Diagnosis present

## 2020-10-17 DIAGNOSIS — M7989 Other specified soft tissue disorders: Secondary | ICD-10-CM | POA: Diagnosis not present

## 2020-10-17 LAB — RENAL FUNCTION PANEL
Albumin: 2.2 g/dL — ABNORMAL LOW (ref 3.5–5.0)
Anion gap: 9 (ref 5–15)
BUN: 37 mg/dL — ABNORMAL HIGH (ref 8–23)
CO2: 27 mmol/L (ref 22–32)
Calcium: 7.3 mg/dL — ABNORMAL LOW (ref 8.9–10.3)
Chloride: 97 mmol/L — ABNORMAL LOW (ref 98–111)
Creatinine, Ser: 7.16 mg/dL — ABNORMAL HIGH (ref 0.61–1.24)
GFR, Estimated: 8 mL/min — ABNORMAL LOW (ref >60)
Glucose, Bld: 102 mg/dL — ABNORMAL HIGH (ref 70–99)
Phosphorus: 4.3 mg/dL (ref 2.5–4.6)
Potassium: 3.9 mmol/L (ref 3.5–5.1)
Sodium: 133 mmol/L — ABNORMAL LOW (ref 135–145)

## 2020-10-17 LAB — GLUCOSE, CAPILLARY
Glucose-Capillary: 103 mg/dL — ABNORMAL HIGH (ref 70–99)
Glucose-Capillary: 105 mg/dL — ABNORMAL HIGH (ref 70–99)
Glucose-Capillary: 105 mg/dL — ABNORMAL HIGH (ref 70–99)
Glucose-Capillary: 118 mg/dL — ABNORMAL HIGH (ref 70–99)
Glucose-Capillary: 96 mg/dL (ref 70–99)

## 2020-10-17 LAB — CBC
HCT: 24 % — ABNORMAL LOW (ref 39.0–52.0)
Hemoglobin: 7.1 g/dL — ABNORMAL LOW (ref 13.0–17.0)
MCH: 27 pg (ref 26.0–34.0)
MCHC: 29.6 g/dL — ABNORMAL LOW (ref 30.0–36.0)
MCV: 91.3 fL (ref 80.0–100.0)
Platelets: 197 10*3/uL (ref 150–400)
RBC: 2.63 MIL/uL — ABNORMAL LOW (ref 4.22–5.81)
RDW: 16.2 % — ABNORMAL HIGH (ref 11.5–15.5)
WBC: 8.4 10*3/uL (ref 4.0–10.5)
nRBC: 0 % (ref 0.0–0.2)

## 2020-10-17 MED ORDER — LOSARTAN POTASSIUM 25 MG PO TABS: 25.0000 mg | ORAL_TABLET | Freq: Every day | ORAL | Status: DC

## 2020-10-17 MED ORDER — DOCUSATE SODIUM 100 MG PO CAPS: 100.0000 mg | ORAL_CAPSULE | Freq: Two times a day (BID) | ORAL | Status: DC | PRN

## 2020-10-17 MED ORDER — LABETALOL HCL 100 MG PO TABS
300.0000 mg | ORAL_TABLET | Freq: Three times a day (TID) | ORAL | Status: DC
  Administered 2020-10-18 – 2020-10-27 (×24): 300 mg via ORAL
  Filled 2020-10-17 (×24): qty 3

## 2020-10-17 MED ORDER — MYCOPHENOLATE SODIUM 180 MG PO TBEC
360.0000 mg | DELAYED_RELEASE_TABLET | Freq: Two times a day (BID) | ORAL | Status: DC
  Administered 2020-10-17 – 2020-10-24 (×14): 360 mg via ORAL
  Filled 2020-10-17 (×15): qty 2

## 2020-10-17 MED ORDER — ACETAMINOPHEN 650 MG RE SUPP: 650.0000 mg | RECTAL | Status: DC | PRN

## 2020-10-17 MED ORDER — SEVELAMER CARBONATE 800 MG PO TABS: 800.0000 mg | ORAL_TABLET | Freq: Three times a day (TID) | ORAL | Status: DC

## 2020-10-17 MED ORDER — ONDANSETRON HCL 4 MG/2ML IJ SOLN
4.0000 mg | INTRAMUSCULAR | Status: DC | PRN
Start: 2020-10-17 — End: 2020-10-27

## 2020-10-17 MED ORDER — LEVETIRACETAM 250 MG PO TABS: 250.0000 mg | ORAL_TABLET | Freq: Two times a day (BID) | ORAL | Status: DC

## 2020-10-17 MED ORDER — ALLOPURINOL 100 MG PO TABS
200.0000 mg | ORAL_TABLET | Freq: Every day | ORAL | Status: DC
  Administered 2020-10-18 – 2020-10-27 (×10): 200 mg via ORAL
  Filled 2020-10-17 (×11): qty 2

## 2020-10-17 MED ORDER — AMLODIPINE BESYLATE 5 MG PO TABS
5.0000 mg | ORAL_TABLET | Freq: Every day | ORAL | Status: DC
  Administered 2020-10-18 – 2020-10-27 (×10): 5 mg via ORAL
  Filled 2020-10-17 (×10): qty 1

## 2020-10-17 MED ORDER — LEVETIRACETAM 250 MG PO TABS
250.0000 mg | ORAL_TABLET | Freq: Two times a day (BID) | ORAL | Status: DC
  Administered 2020-10-17 – 2020-10-27 (×20): 250 mg via ORAL
  Filled 2020-10-17 (×20): qty 1

## 2020-10-17 MED ORDER — RENA-VITE PO TABS
1.0000 | ORAL_TABLET | Freq: Every day | ORAL | Status: DC
  Filled 2020-10-17: qty 1

## 2020-10-17 MED ORDER — INSULIN ASPART 100 UNIT/ML IJ SOLN
0.0000 [IU] | Freq: Three times a day (TID) | INTRAMUSCULAR | Status: DC
  Administered 2020-10-22 – 2020-10-26 (×3): 2 [IU] via SUBCUTANEOUS

## 2020-10-17 MED ORDER — ALTEPLASE 2 MG IJ SOLR: 2.0000 mg | Freq: Once | INTRAMUSCULAR | Status: DC | PRN

## 2020-10-17 MED ORDER — DARBEPOETIN ALFA 60 MCG/0.3ML IJ SOSY: 60.0000 ug | PREFILLED_SYRINGE | INTRAMUSCULAR | Status: AC

## 2020-10-17 MED ORDER — TACROLIMUS 1 MG PO CAPS
2.0000 mg | ORAL_CAPSULE | Freq: Two times a day (BID) | ORAL | Status: DC
  Administered 2020-10-17 – 2020-10-27 (×20): 2 mg via ORAL
  Filled 2020-10-17 (×20): qty 2

## 2020-10-17 MED ORDER — CALCITRIOL 0.5 MCG PO CAPS: 0.5000 ug | ORAL_CAPSULE | ORAL | Status: DC

## 2020-10-17 MED ORDER — DARBEPOETIN ALFA 60 MCG/0.3ML IJ SOSY
60.0000 ug | PREFILLED_SYRINGE | INTRAMUSCULAR | Status: DC
  Administered 2020-10-17: 60 ug via INTRAVENOUS
  Filled 2020-10-17: qty 0.3

## 2020-10-17 MED ORDER — HYDROCODONE-ACETAMINOPHEN 5-325 MG PO TABS: 1.0000 | ORAL_TABLET | ORAL | Status: DC | PRN

## 2020-10-17 MED ORDER — SENNA 8.6 MG PO TABS
1.0000 | ORAL_TABLET | Freq: Two times a day (BID) | ORAL | Status: DC
Start: 2020-10-17 — End: 2020-10-27
  Administered 2020-10-17 – 2020-10-27 (×20): 8.6 mg via ORAL
  Filled 2020-10-17 (×20): qty 1

## 2020-10-17 MED ORDER — PANTOPRAZOLE SODIUM 40 MG PO TBEC
40.0000 mg | DELAYED_RELEASE_TABLET | Freq: Every day | ORAL | Status: DC
  Administered 2020-10-18 – 2020-10-26 (×9): 40 mg via ORAL
  Filled 2020-10-17 (×9): qty 1

## 2020-10-17 MED ORDER — LOSARTAN POTASSIUM 25 MG PO TABS
25.0000 mg | ORAL_TABLET | Freq: Every day | ORAL | Status: DC
  Administered 2020-10-18 – 2020-10-27 (×10): 25 mg via ORAL
  Filled 2020-10-17 (×10): qty 1

## 2020-10-17 MED ORDER — LABETALOL HCL 5 MG/ML IV SOLN: 10.0000 mg | INTRAVENOUS | Status: DC | PRN

## 2020-10-17 MED ORDER — PANTOPRAZOLE SODIUM 40 MG PO TBEC: 40.0000 mg | DELAYED_RELEASE_TABLET | Freq: Every day | ORAL | Status: DC

## 2020-10-17 MED ORDER — TACROLIMUS 1 MG PO CAPS: 2.0000 mg | ORAL_CAPSULE | Freq: Two times a day (BID) | ORAL | Status: DC

## 2020-10-17 MED ORDER — ACETAMINOPHEN 325 MG PO TABS
650.0000 mg | ORAL_TABLET | ORAL | Status: DC | PRN
  Administered 2020-10-21: 650 mg via ORAL
  Filled 2020-10-17: qty 2

## 2020-10-17 MED ORDER — CALCITRIOL 0.25 MCG PO CAPS
0.5000 ug | ORAL_CAPSULE | ORAL | Status: DC
  Administered 2020-10-21 – 2020-10-26 (×3): 0.5 ug via ORAL
  Filled 2020-10-17 (×4): qty 2

## 2020-10-17 MED ORDER — SEVELAMER CARBONATE 800 MG PO TABS
800.0000 mg | ORAL_TABLET | Freq: Three times a day (TID) | ORAL | Status: DC
  Administered 2020-10-18 – 2020-10-27 (×27): 800 mg via ORAL
  Filled 2020-10-17 (×24): qty 1

## 2020-10-17 MED ORDER — ONDANSETRON HCL 4 MG PO TABS
4.0000 mg | ORAL_TABLET | ORAL | Status: DC | PRN
  Administered 2020-10-18: 4 mg via ORAL
  Filled 2020-10-17 (×2): qty 1

## 2020-10-17 NOTE — Progress Notes (Signed)
Inpatient Rehabilitation Medication Review by a Pharmacist  A complete drug regimen review was completed for this patient to identify any potential clinically significant medication issues.  High Risk Drug Classes Is patient taking? Indication by Medication  Antipsychotic No   Anticoagulant No   Antibiotic No   Opioid Yes Norco PRN for pain  Antiplatelet No   Hypoglycemics/insulin Yes Novolog sliding scale  Vasoactive Medication Yes Amlodipine, labetalol, losartan for hypertension  Chemotherapy No   Other Yes Levetiracetam for seizure prophylaxis x 14 days, Aranesp for chronic anemia, mycophenolate/tacrolimus for history of renal transplant, allopurinol, calcitriol for PTH control, Renvela for phosphorus level control, senokot for constipation, protonix     Type of Medication Issue Identified Description of Issue Recommendation(s)  Drug Interaction(s) (clinically significant)     Duplicate Therapy     Allergy     No Medication Administration End Date  Keppra for seizure prophylaxis x 14 days on discharge planned - no stop date entered Rehab MD note says likely d/c Keppra on d/c from Rehab - f/u d/c on discharge vs. Add stop date  Incorrect Dose     Additional Drug Therapy Needed  Aspirin, atorvastatin, and doxazosin listed to continue on discharge summary, not reordered on Rehab transfer F/u with Rehab provider to ensure these should not be ordered  Significant med changes from prior encounter (inform family/care partners about these prior to discharge).    Other       Clinically significant medication issues were identified that warrant physician communication and completion of prescribed/recommended actions by midnight of the next day:  Yes  Name of provider notified for urgent issues identified: Kirsteins, A  Provider Method of Notification: Epic secure chat    Pharmacist comments: contacted Rehab provider to confirm additional medications don't need to be reordered.  Pharmacist will follow for Keppra stop date  Time spent performing this drug regimen review (minutes):  [place] 10/17/2020 6:13 PM

## 2020-10-17 NOTE — Progress Notes (Signed)
He looks pretty good this morning.  He is awake and alert moving all extremities equally.  Incision is clean dry and intact.  Okay to move out of the ICU.  Continue PT and OT.  CIR consult.

## 2020-10-17 NOTE — Progress Notes (Signed)
PT Cancellation Note  Patient Details Name: Christopher Chapman MRN: [id number] DOB: 1955-04-26   Cancelled Treatment:    Reason Eval/Treat Not Completed: Other (comment). Attempted to see pt, pt was starting breakfast in supine position. Offered to assist pt up into chair so he was upright however pt deferred. Attempted again and pt sleeping in chair. Per chart pt will be transferring to CIR later today. Acute PT to return as able to progress mobility.  Kittie Plater, PT, DPT Acute Rehabilitation Services Pager #: [phone] Office #: [phone]    Berline Lopes 10/17/2020, 1:42 PM

## 2020-10-17 NOTE — Progress Notes (Signed)
Patient presents to unit via bed. Patient able to understand some orientation. Patient will need some reinforcement. Dialysis called for report and will be soon transferred to that unit. Sanda Linger, LPN

## 2020-10-17 NOTE — Progress Notes (Signed)
Physical Medicine and Rehabilitation Admission H&P        Chief Complaint  Patient presents with   Altered Mental Status  : HPI: Christopher Chapman is a 65 year old right-handed male with history of hypertension, prediabetes, gout, hyperlipidemia, chronic anemia, polysubstance/tobacco abuse, end-stage renal disease secondary to hypertension on hemodialysis for 5 years before receiving a DDRT(decreased donor renal transplantation) at [facility] 09/13/2016 followed by Dr. Moshe Cipro with [facility] and medical noncompliance.  Per chart review patient lives with his elderly mother who has early dementia as well as his brother.  Independent prior to admission.  Presented 10/05/2020 after being found down for unknown amount of time.  Admission chemistries hemoglobin 9.6, potassium 5.4, BUN 102, creatinine 17.95, CK 511, lactic acid 1.0, troponin 66, ammonia level 37, hemoglobin A1c 5.6.  Cranial CT scan showed subacute right-sided subdural hematoma with significant mass-effect and leftward midline shift measuring 1.7 cm.  CT cervical spine negative.  Underwent right craniotomy for evacuation of subdural hematoma 10/05/2020 per Dr. Sherley Bounds.  Maintained on Keppra for seizure prophylaxis.  Nephrology services consulted placed on hemodialysis for progressive decline in renal function.  Hospital course follow-up cranial CT scan showed increasing right to left midline shift with increased effacement of the lateral ventricles and underwent subdural drain placement for recurrent right chronic subdural hematoma 10/14/2020 per Dr. Sherley Bounds..  Maintain on a regular diet.  Hemodialysis continues to follow plan is for outpatient hemodialysis Monday Wednesday Fridays with latest creatinine improved to 7.16.  Acute on chronic anemia latest hemoglobin 7.1.  Therapy evaluations completed due to patient decreased functional mobility was admitted for a comprehensive rehab program.   Review of Systems  Constitutional:  Negative for  chills and fever.  HENT:  Negative for hearing loss.   Eyes:  Negative for blurred vision and double vision.  Respiratory:  Negative for cough and shortness of breath.   Cardiovascular:  Positive for leg swelling. Negative for chest pain and palpitations.  Gastrointestinal:  Positive for constipation. Negative for heartburn, nausea and vomiting.  Genitourinary:  Negative for dysuria, flank pain and hematuria.  Musculoskeletal:  Positive for joint pain and myalgias.  Skin:  Negative for rash.  Psychiatric/Behavioral:  The patient has insomnia.   All other systems reviewed and are negative.     Past Medical History:  Diagnosis Date   Anemia     Arthritis     CRF (chronic renal failure)     ESRD (end stage renal disease) ([place])     Family history of anesthesia complication      [place] going to sleep.   Gout     Hx of cocaine abuse ([place])     Hyperlipidemia     Hypertension     Hyperuricemia     MRSA (methicillin resistant staph aureus) culture positive 07/2011   Parathyroid disease ([place])      Hx of , No meds at present   Tobacco abuse     Tuberculosis 1985    Treated with imeds for a years.         Past Surgical History:  Procedure Laterality Date   ANGIOPLASTY   08/09/2011    Procedure: ANGIOPLASTY;  Surgeon: Angelia Mould, MD;  Location: [facility]UR[facility]S;  Service: Vascular;  Laterality: Left;  Left Braciocephalic Fistula using Vascu-Guard Patch   APPENDECTOMY   1980   [place] Left 2010   CRANIOTOMY Right 10/05/2020    Procedure: CRANIOTOMY HEMATOMA EVACUATION SUBDURAL;  Surgeon: Sherley Bounds  S, MD;  Location: [facility];  Service: Neurosurgery;  Laterality: Right;   INCISION AND DRAINAGE   12/2012    sebacebus cyst, infected   MANDIBLE FRACTURE SURGERY       [name] N/A 07/16/2011    Procedure: Earney Mallet;  Surgeon: Angelia Mould, MD;  Location: [facility] CATH LAB;  Service: Cardiovascular;  Laterality: N/A;         Family History  Problem Relation Age  of Onset   Diabetes Father     Heart disease Father     Hypertension Father     Other Father          amputation   Healthy Mother     Diabetes Sister     Heart disease Sister     Colon cancer Neg Hx     Stomach cancer Neg Hx     Rectal cancer Neg Hx     Esophageal cancer Neg Hx     Liver cancer Neg Hx     Colon polyps Neg Hx      Social History:  reports that he has been smoking cigarettes. He started smoking about 4 years ago. He has a 3.00 pack-year smoking history. He has never used smokeless tobacco. He reports current alcohol use. He reports that he does not use drugs. Allergies: No Known Allergies       Medications Prior to Admission  Medication Sig Dispense Refill   allopurinol (ZYLOPRIM) 100 MG tablet Take 200 mg by mouth daily.        amLODipine (NORVASC) 5 MG tablet Take 1 tablet (5 mg total) by mouth daily. 30 tablet 11   ASPIRIN LOW DOSE 81 MG EC tablet TAKE 1 TABLET(81 MG) BY MOUTH DAILY. SWALLOW WHOLE (Patient taking differently: Take 81 mg by mouth daily.) 90 tablet 0   atorvastatin (LIPITOR) 40 MG tablet Take 1 tablet (40 mg total) by mouth daily. 30 tablet 11   doxazosin (CARDURA) 8 MG tablet Take 8 mg by mouth at bedtime.       furosemide (LASIX) 40 MG tablet Take 40 mg by mouth 2 (two) times daily.       labetalol (NORMODYNE) 300 MG tablet Take 1 tablet (300 mg total) by mouth 3 (three) times daily. (Patient taking differently: Take 300 mg by mouth 2 (two) times daily.) 90 tablet 0   mycophenolate (MYFORTIC) 180 MG EC tablet Take 360 mg by mouth 2 (two) times daily.       tacrolimus (PROGRAF) 1 MG capsule Take 4 mg by mouth 2 (two) times daily.        ACCU-CHEK AVIVA PLUS test strip USE 1 STRIP DAILY TO TEST BLOOD SUGAR   0   Blood Glucose Monitoring Suppl (ACCU-CHEK AVIVA PLUS) w/Device KIT USE AS INSTRUCTED; PLEASE CHECK BLOOD SUGAR ONCE A DAY 1 2 HRS AFTER MEALS   0   Lancets (ACCU-CHEK MULTICLIX) lancets Inject into the skin.       metroNIDAZOLE (FLAGYL) 500 MG  tablet Take 1 tablet (500 mg total) by mouth 2 (two) times daily. (Patient not taking: No sig reported) 14 tablet 0      Drug Regimen Review Drug regimen was reviewed and remains appropriate with no significant issues identified   Home: Home Living Family/patient expects to be discharged to:: Private residence Living Arrangements: Parent, Other relatives Available Help at Discharge: Available PRN/intermittently Type of Home: House Home Layout: Multi-level Additional Comments: Pt reports he lives in a split level home with his 22 y.o.  mother, whom he provides some care for (cognitive deficits), and his retired brother.  He was unable to provide further info re: home set up   Functional History: Prior Function Level of Independence: Independent Comments: Pt denies h/o falls.  he does not use AD for ambulation.  He reports he drives, and is a retired Manufacturing systems engineer of all trades, Restaurant manager, fast food of none"   Functional Status:  Mobility: Bed Mobility Overal bed mobility: Needs Assistance Bed Mobility: Supine to Sit Rolling: Min assist Supine to sit: Supervision Sit to supine: Mod assist General bed mobility comments: close supervision Transfers Overall transfer level: Needs assistance Equipment used: Rolling walker (2 wheeled) Transfers: Sit to/from Stand Sit to Stand: Supervision Stand pivot transfers: Mod assist General transfer comment: Close supervision for safety, pt preferring for PT to "back up so he could do it for himself." Use of momentum to power up Ambulation/Gait Ambulation/Gait assistance: Min guard, +2 safety/equipment Gait Distance (Feet): 20 Feet Assistive device: Rolling walker (2 wheeled) Gait Pattern/deviations: Decreased stride length, Step-to pattern, Step-through pattern General Gait Details: slow but steady pace, min guard +2 safety, improved left foot clearance Gait velocity: decreased   ADL: ADL Overall ADL's : Needs assistance/impaired Eating/Feeding: Set up,  Sitting Eating/Feeding Details (indicate cue type and reason): spillage noted and bacon in bed on arrival. pt unaware of time of day but had eaten breakfast prior to arrival Grooming: Wash/dry hands, Applying deodorant, Oral care, Minimal assistance Grooming Details (indicate cue type and reason): assist to sequence and attend to task Upper Body Bathing: Moderate assistance, Bed level Lower Body Bathing: Maximal assistance, Bed level Upper Body Dressing : Maximal assistance, Bed level Lower Body Dressing: Moderate assistance, Sitting/lateral leans Toilet Transfer: Maximal assistance, Squat-pivot, BSC Toilet Transfer Details (indicate cue type and reason): unable to safely attempt with +1 assist due to pt impulsivity Toileting- Clothing Manipulation and Hygiene: Sit to/from stand, Maximal assistance Functional mobility during ADLs: Moderate assistance General ADL Comments: pt very impulsive, poor safety awareness and insight into his deficits. verbal or tactile cues throughout, also requires assist for LLE during ambulation and LUE during functional task   Cognition: Cognition Overall Cognitive Status: Impaired/Different from baseline Orientation Level: Oriented X4 Cognition Arousal/Alertness: Awake/alert Behavior During Therapy: Impulsive Overall Cognitive Status: Impaired/Different from baseline Area of Impairment: Memory, Following commands, Safety/judgement, Awareness, Problem solving Orientation Level: Disoriented to, Situation (unsure how "I broke my head open") Current Attention Level: Focused Memory: Decreased recall of precautions Following Commands: Follows multi-step commands inconsistently Safety/Judgement: Decreased awareness of safety, Decreased awareness of deficits Awareness: Intellectual Problem Solving: Requires verbal cues General Comments: Pt continues with decreased insight into deficits/safety, repeatedly asking if he could now get up by himself despite instruction  he could not with the subdural drain.   Physical Exam: Blood pressure 134/68, pulse 68, temperature 98 F (36.7 C), temperature source Oral, resp. rate 17, height _0  (1.854 m), weight 101.7 kg, SpO2 98 %. Physical Exam HENT:     Head:     Comments: Right  parietal Craniotomy site clean and dry.Clips intact Neurological:     Comments: Patient is alert.  Sitting up in bed.  Makes eye contact with examiner.  Provides name and age.  [place]. Poor awareness of deficits, denies problem with balance   General: No acute distress Mood and affect are appropriate Heart: Regular rate and rhythm no rubs murmurs or extra sounds Lungs: Clear to auscultation, breathing unlabored, no rales or wheezes Abdomen: Positive bowel sounds, soft nontender  to palpation, nondistended Extremities: No clubbing, cyanosis, or edema Skin: No evidence of breakdown, no evidence of rash Neurologic: Cranial nerves II through XII intact, motor strength is 5/5 in bilateral deltoid, bicep, tricep, grip, hip flexor, knee extensors, ankle dorsiflexor and plantar flexor Sensory exam normal sensation to light touch  in bilateral upper and lower extremities Cerebellar exam normal finger to nose to finger as well as heel to shin in bilateral upper and lower extremities Musculoskeletal: Full range of motion in all 4 extremities. No joint swelling    Lab Results Last 48 Hours        Results for orders placed or performed during the hospital encounter of 10/05/20 (from the past 48 hour(s))  Glucose, capillary     Status: Abnormal    Collection Time: 10/15/20  8:18 AM  Result Value Ref Range    Glucose-Capillary 176 (H) 70 - 99 mg/dL      Comment: Glucose reference range applies only to samples taken after fasting for at least 8 hours.  Glucose, capillary     Status: Abnormal    Collection Time: 10/15/20 11:45 AM  Result Value Ref Range    Glucose-Capillary 111 (H) 70 - 99 mg/dL      Comment: Glucose reference  range applies only to samples taken after fasting for at least 8 hours.  Glucose, capillary     Status: Abnormal    Collection Time: 10/15/20  5:14 PM  Result Value Ref Range    Glucose-Capillary 137 (H) 70 - 99 mg/dL      Comment: Glucose reference range applies only to samples taken after fasting for at least 8 hours.  Glucose, capillary     Status: Abnormal    Collection Time: 10/15/20  9:57 PM  Result Value Ref Range    Glucose-Capillary 135 (H) 70 - 99 mg/dL      Comment: Glucose reference range applies only to samples taken after fasting for at least 8 hours.  CBC     Status: Abnormal    Collection Time: 10/16/20  6:25 AM  Result Value Ref Range    WBC 8.4 4.0 - 10.5 K/uL    RBC 2.64 (L) 4.22 - 5.81 MIL/uL    Hemoglobin 7.4 (L) 13.0 - 17.0 g/dL    HCT 23.4 (L) 39.0 - 52.0 %    MCV 88.6 80.0 - 100.0 fL    MCH 28.0 26.0 - 34.0 pg    MCHC 31.6 30.0 - 36.0 g/dL    RDW 16.4 (H) 11.5 - 15.5 %    Platelets 197 150 - 400 K/uL    nRBC 0.0 0.0 - 0.2 %      Comment: Performed at [facility] [address] [place], [place] [address]  Basic metabolic panel     Status: Abnormal    Collection Time: 10/16/20  6:25 AM  Result Value Ref Range    Sodium 134 (L) 135 - 145 mmol/L    Potassium 4.1 3.5 - 5.1 mmol/L    Chloride 98 98 - 111 mmol/L    CO2 23 22 - 32 mmol/L    Glucose, Bld 100 (H) 70 - 99 mg/dL      Comment: Glucose reference range applies only to samples taken after fasting for at least 8 hours.    BUN 63 (H) 8 - 23 mg/dL    Creatinine, Ser 9.84 (H) 0.61 - 1.24 mg/dL    Calcium 6.7 (L) 8.9 - 10.3 mg/dL  GFR, Estimated 5 (L) >60 mL/min      Comment: (NOTE) Calculated using the CKD-EPI Creatinine Equation (2021)      Anion gap 13 5 - 15      Comment: Performed at [facility], [place] [address], [place], [place] [address]  Glucose, capillary     Status: Abnormal    Collection Time: 10/16/20 12:08 PM  Result Value Ref Range    Glucose-Capillary 121 (H) 70 - 99  mg/dL      Comment: Glucose reference range applies only to samples taken after fasting for at least 8 hours.  Glucose, capillary     Status: None    Collection Time: 10/16/20 10:03 PM  Result Value Ref Range    Glucose-Capillary 93 70 - 99 mg/dL      Comment: Glucose reference range applies only to samples taken after fasting for at least 8 hours.  Renal function panel     Status: Abnormal    Collection Time: 10/17/20  2:21 AM  Result Value Ref Range    Sodium 133 (L) 135 - 145 mmol/L    Potassium 3.9 3.5 - 5.1 mmol/L    Chloride 97 (L) 98 - 111 mmol/L    CO2 27 22 - 32 mmol/L    Glucose, Bld 102 (H) 70 - 99 mg/dL      Comment: Glucose reference range applies only to samples taken after fasting for at least 8 hours.    BUN 37 (H) 8 - 23 mg/dL    Creatinine, Ser 7.16 (H) 0.61 - 1.24 mg/dL    Calcium 7.3 (L) 8.9 - 10.3 mg/dL    Phosphorus 4.3 2.5 - 4.6 mg/dL    Albumin 2.2 (L) 3.5 - 5.0 g/dL    GFR, Estimated 8 (L) >60 mL/min      Comment: (NOTE) Calculated using the CKD-EPI Creatinine Equation (2021)      Anion gap 9 5 - 15      Comment: Performed at [facility] [address], [place], [place] [address]  CBC     Status: Abnormal    Collection Time: 10/17/20  2:21 AM  Result Value Ref Range    WBC 8.4 4.0 - 10.5 K/uL    RBC 2.63 (L) 4.22 - 5.81 MIL/uL    Hemoglobin 7.1 (L) 13.0 - 17.0 g/dL    HCT 24.0 (L) 39.0 - 52.0 %    MCV 91.3 80.0 - 100.0 fL    MCH 27.0 26.0 - 34.0 pg    MCHC 29.6 (L) 30.0 - 36.0 g/dL    RDW 16.2 (H) 11.5 - 15.5 %    Platelets 197 150 - 400 K/uL    nRBC 0.0 0.0 - 0.2 %      Comment: Performed at [facility] [address] [place], [place] [address]       Imaging Results (Last 48 hours)  CT HEAD WO CONTRAST   Result Date: 10/15/2020 CLINICAL DATA:  65 year old male postoperative day 1 subdural drain placement for recurrent right side subdural hematoma. EXAM: CT HEAD WITHOUT CONTRAST TECHNIQUE: Contiguous axial images were obtained  from the base of the skull through the vertex without intravenous contrast. COMPARISON:  Head CT 10/12/2020, 10/05/2020. FINDINGS: Brain: Right side subdural drain now in place. Increased right side pneumocephalus but decreased volume of mixed density subdural hematoma since 10/12/2020. Decreased leftward midline shift from up to 10 mm at the anterior septum pellucidum previously now 4-5 mm. Improved patency  of the right lateral ventricle. No ventriculomegaly. No cortically based acute infarct identified. Basilar cisterns remain patent. Vascular: Mild Calcified atherosclerosis at the skull base. Skull: Right side craniotomy.  No new osseous abnormality. Sinuses/Orbits: Visualized paranasal sinuses and mastoids are stable and well aerated. Other: Postoperative changes to the right scalp. Orbits soft tissues remain negative. IMPRESSION: 1. Right side subdural placed with decreased volume of mixed density right subdural hematoma since 10/12/2020. Mild subsequent pneumocephalus. Leftward midline shift decreased from up to 10 mm to 4 mm. Improved patency of the right lateral ventricle. 2. No new intracranial abnormality. Electronically Signed   By: Genevie Ann M.D.   On: 10/15/2020 08:26             Medical Problem List and Plan: 1.  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  Drains discontinued 10/16/2020             -patient may not shower             -ELOS/Goals: 7-10d 2.  Antithrombotics: -DVT/anticoagulation: SCDs.  Check vascular study             -antiplatelet therapy: N/A 3. Pain Management: Hydrocodone as needed 4. Mood: Provide emotional support             -antipsychotic agents: N/A 5. Neuropsych: This patient is capable of making decisions on his own behalf. 6. Skin/Wound Care: Routine skin checks 7. Fluids/Electrolytes/Nutrition:  Routine in and outs with follow-up chemistries 8.  Seizure prophylaxis.  Keppra 250 mg twice daily , no seizure activity should be able to stop prior to d/c from CIR 9.  End-stage renal disease with decreased donor renal transplantation 09/13/2016.  Continue hemodialysis as directed 10.  Acute on chronic anemia.  Continue Aranesp 11.  Hypertension.  Norvasc 5 mg daily, labetalol 300 mg 3 times daily, Cozaar 25 mg daily.  Monitor with increased mobility 12.  Prediabetes.  Hemoglobin A1c 5.6.  SSI 13.  History of polysubstance abuse.  Counseling, states he has being using ETOH and tobacco but not other substances     Cathlyn Parsons, PA-C 10/17/2020  "I have personally performed a face to face diagnostic evaluation of this patient.  Additionally, I have reviewed and concur with the physician assistant's documentation above." Charlett Blake M.D. [facility] Fellow Am [employer] of [employer] Med and Rehab Diplomate Am Board of Electrodiagnostic Med Fellow Am Board of Interventional Pain

## 2020-10-17 NOTE — Discharge Summary (Signed)
Name: Christopher Chapman MRN: [id number] DOB: 29-Sep-1955 65 y.o. PCP: Verdene Lennert, MD  Date of Admission: 10/05/2020  2:28 PM Date of Discharge: 10/17/2020 Attending Physician: Gust Rung, DO  Discharge Diagnosis: 1. Subacute R SDH now S/P R Craniotomy and Evacuation 2.  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  Hypertension 4.  Diabetes mellitus  Discharge Medications: Allergies as of 10/17/2020   No Known Allergies      Medication List     STOP taking these medications    furosemide 40 MG tablet Commonly known as: LASIX   metroNIDAZOLE 500 MG tablet Commonly known as: FLAGYL       TAKE these medications    Accu-Chek Aviva Plus test strip Generic drug: glucose blood USE 1 STRIP DAILY TO TEST BLOOD SUGAR   Accu-Chek Aviva Plus w/Device Kit USE AS INSTRUCTED; PLEASE CHECK BLOOD SUGAR ONCE A DAY 1 2 HRS AFTER MEALS   accu-chek multiclix lancets Inject into the skin.   allopurinol 100 MG tablet Commonly known as: ZYLOPRIM Take 200 mg by mouth daily.   alteplase 2 MG injection Commonly known as: CATHFLO ACTIVASE 2 mg by Intracatheter route once as needed for open catheter.   amLODipine 5 MG tablet Commonly known as: NORVASC Take 1 tablet (5 mg total) by mouth daily.   Aspirin Low Dose 81 MG EC tablet Generic drug: aspirin TAKE 1 TABLET(81 MG) BY MOUTH DAILY. SWALLOW WHOLE What changed: See the new instructions.   atorvastatin 40 MG tablet Commonly known as: Lipitor Take 1 tablet (40 mg total) by mouth daily.   calcitRIOL 0.5 MCG capsule Commonly known as: ROCALTROL Take 1 capsule (0.5 mcg total) by mouth every Monday, Wednesday, and Friday with hemodialysis. Start taking on: October 19, 2020   Darbepoetin Alfa 60 MCG/0.3ML Sosy injection Commonly known as: ARANESP Inject 0.3 mLs (60 mcg total) into the vein every Monday with hemodialysis.   doxazosin 8 MG tablet Commonly known as: CARDURA Take 8 mg by mouth at  bedtime.   labetalol 300 MG tablet Commonly known as: NORMODYNE Take 1 tablet (300 mg total) by mouth 3 (three) times daily. What changed: when to take this   labetalol 5 MG/ML injection Commonly known as: NORMODYNE Inject 2-8 mLs (10-40 mg total) into the vein every 10 (ten) minutes as needed. What changed: You were already taking a medication with the same name, and this prescription was added. Make sure you understand how and when to take each.   levETIRAcetam 250 MG tablet Commonly known as: KEPPRA Take 1 tablet (250 mg total) by mouth 2 (two) times daily for 14 days.   losartan 25 MG tablet Commonly known as: COZAAR Take 1 tablet (25 mg total) by mouth daily. Start taking on: October 18, 2020   mycophenolate 180 MG EC tablet Commonly known as: MYFORTIC Take 360 mg by mouth 2 (two) times daily.   pantoprazole 40 MG tablet Commonly known as: PROTONIX Take 1 tablet (40 mg total) by mouth at bedtime.   sevelamer carbonate 800 MG tablet Commonly known as: RENVELA Take 1 tablet (800 mg total) by mouth 3 (three) times daily with meals.   tacrolimus 1 MG capsule Commonly known as: PROGRAF Take 2 capsules (2 mg total) by mouth 2 (two) times daily. What changed: how much to take        Disposition and follow-up:   ChristopherAntwain W Chapman was discharged from [facility] in Good condition. At the hospital follow  up visit please address:  1.  Subacute R SDH now S/P R Craniotomy and Evacuation: Patient was discharged on Keppra regimen, to be continued until 10/24 per neurosurgery.  He will follow-up with neurosurgery in the outpatient setting in 2 weeks.  2.  Labs / imaging needed at time of follow-up: None  3.  Pending labs/ test needing follow-up: None  Follow-up Appointments:  Follow-up Information     [name], MD. Schedule an appointment as soon as possible for a visit in 2 week(s).   Specialty: Neurosurgery Contact information: [address] [address] [phone]                 Hospital Course by problem list: Subacute R SDH now S/P R Craniotomy and Evacuation:  Patient presented to the ED on 9/28 after being found down for an unknown amount of time.  CT of the head was performed and showed a very large right sided chronic subdural hematoma with a 1.7cm midline shift and mass effect.  At that time, patient underwent emergency right-sided craniotomy for evacuation of subdural hematoma, performed on the same day. He was continued on postop Keppra and Decadron per neurosurgery. Postoperatively, PCCM was consulted to assist with medical management until 10/1 when he was admitted to our service.  Patient was mentating well at bedside for the majority of his the rest of his hospitalization, with the exception of some pressured speech and tangential language. Therefore, Decadron was discontinued on 10/4, with improvement of his mental status as a result.  On 10/5, repeat head CT without contrast was obtained and showed reaccumulation of hyperdense fluid collection consistent with recurrent chronic subdural hematoma.  Therefore, he underwent repeat craniotomy and evacuation on 10/7.  Postoperatively, the patient did well with no complaints, and his drain was removed on 10/9.  He was discharged to CIR on 10/10 with the recommendation to continue Keppra for 2 additional weeks.   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 Creatinine was already in high 5's range in 03/17/20 and he was counseled that he was nearing ESRD again but missed his f/u appt 2 mths later. He is almost certainly ESRD again now; h/o noncompliance and currently with poor social support thus may not be a candidate for another transplant. While inpatient, he has been followed by nephrology and has been receiving iHD. His  mycophenolate and tacrolimus regimens were continued per nephrology.  Due to his procedure on Friday 10/7, his HD was postponed to the next day, 10/8.  He resumed his normal HD schedule on 10/10 prior to discharge.   HTN:   Throughout his hospitalization, this was managed with amlodipine 5 mg daily, labetalol 300 mg 3 times daily, and as needed hydralazine.    DM:  Sliding scale insulin was continued throughout his hospitalization.   Acute Blood Loss Anemia Platelet Dysfunction in the Setting of Uremia Patient received 1 PRBC on 9/28.  Otherwise, his CBCs were followed and hemoglobin was stable throughout his hospitalization.  Discharge Exam:   BP 129/66   Pulse 62   Temp 98.4 F (36.9 C) (Axillary)   Resp 17   Ht $R'6\' 1"'dl$  (1.854 m)   Wt 101.7 kg   SpO2 98%   BMI 29.58 kg/m  Discharge exam:  Constitutional:      General: He is not in acute  distress.    Appearance: Normal appearance.  HENT:     Head:     Comments: Surgical incision is clean, dry, and intact    Mouth/Throat:     Mouth: Mucous membranes are moist.     Pharynx: Oropharynx is clear.  Eyes:     Extraocular Movements: Extraocular movements intact.     Pupils: Pupils are equal, round, and reactive to light.  Cardiovascular:     Rate and Rhythm: Normal rate and regular rhythm.     Pulses: Normal pulses.     Heart sounds: Normal heart sounds. No murmur heard.   No friction rub. No gallop.  Pulmonary:     Effort: Pulmonary effort is normal.     Breath sounds: Normal breath sounds. No wheezing, rhonchi or rales.  Abdominal:     General: Abdomen is flat. Bowel sounds are normal. There is no distension.     Palpations: Abdomen is soft.     Tenderness: There is no abdominal tenderness.  Musculoskeletal:     Right lower leg: No edema.     Left lower leg: No edema.  Skin:    General: Skin is warm and dry.  Neurological:     Mental Status: He is alert and oriented to person, place, and time.     Comments: Cranial  nerves are grossly intact.  No new focal deficit.  Psychiatric:        Mood and Affect: Mood normal.        Behavior: Behavior normal.   Pertinent Labs, Studies, and Procedures:  CBC    Component Value Date/Time   WBC 8.4 10/17/2020 0221   RBC 2.63 (L) 10/17/2020 0221   HGB 7.1 (L) 10/17/2020 0221   HGB 12.6 (L) 03/15/2020 1557   HCT 24.0 (L) 10/17/2020 0221   HCT 39.3 03/15/2020 1557   PLT 197 10/17/2020 0221   PLT 152 03/15/2020 1557   MCV 91.3 10/17/2020 0221   MCV 90 03/15/2020 1557   MCH 27.0 10/17/2020 0221   MCHC 29.6 (L) 10/17/2020 0221   RDW 16.2 (H) 10/17/2020 0221   RDW 14.5 03/15/2020 1557   LYMPHSABS 1.5 10/05/2020 1553   MONOABS 0.7 10/05/2020 1553   EOSABS 0.1 10/05/2020 1553   BASOSABS 0.0 10/05/2020 1553    BMP Latest Ref Rng & Units 10/17/2020 10/16/2020 10/15/2020  Glucose 70 - 99 mg/dL 102(H) 100(H) 156(H)  BUN 8 - 23 mg/dL 37(H) 63(H) 56(H)  Creatinine 0.61 - 1.24 mg/dL 7.16(H) 9.84(H) 8.63(H)  BUN/Creat Ratio 10 - 24 - - -  Sodium 135 - 145 mmol/L 133(L) 134(L) 131(L)  Potassium 3.5 - 5.1 mmol/L 3.9 4.1 4.4  Chloride 98 - 111 mmol/L 97(L) 98 98  CO2 22 - 32 mmol/L $RemoveB'27 23 22  '[id number]$ Calcium 8.9 - 10.3 mg/dL 7.3(L) 6.7(L) 6.9(L)   CT HEAD WO CONTRAST  Result Date: 10/06/2020 CLINICAL DATA:  Follow-up subdural hemorrhage EXAM: CT HEAD WITHOUT CONTRAST TECHNIQUE: Contiguous axial images were obtained from the base of the skull through the vertex without intravenous contrast. COMPARISON:  Head CT 10/05/2020 FINDINGS: Brain: There has been interval right parietal craniotomy for evacuation of a subdural hematoma with a drainage catheter left in place terminating in the right parietal subdural space. The mixed density right subdural hematoma has decreased in size from 2.6 cm on the prior study to up to 1.1 cm on the current study. Hyperdense blood within the collection may reflect redistribution of acute blood products or new blood. There is postoperative  pneumocephalus  within the collection as well as postoperative soft tissue gas and swelling in the scalp. There is significantly decreased mass effect on the underlying brain with decreased effacement of the right lateral ventricle and improved leftward midline shift, currently measuring up to 6 mm at the level of the foramen of Monro, previously measured up to 1.3 cm at a similar level. There is crowding of the right ambient cistern, also improved. The remaining basal cisterns are patent. There is hypodensity in the left occipital lobe not seen on the prior study. Vascular: No hyperdense vessel or unexpected calcification. Skull: As above, there has been interval right parietal craniotomy. There is a remote fracture of the left lamina papyracea. Sinuses/Orbits: Stable. Other: None. IMPRESSION: 1. Interval right parietal craniotomy for drainage of a right holoconvexity subdural hematoma with a drainage catheter left in place terminating over the right parietal lobe. The hematoma has significantly decreased in size, now measuring up to 1.1 cm in maximal thickness, with decreased leftward midline shift now measuring 6 mm. 2. Hypodensity in the left occipital lobe which may be artifactual. Attention on follow-up head CTs. Electronically Signed   By: Valetta Mole M.D.   On: 10/06/2020 08:31   CT HEAD WO CONTRAST (5MM)  Result Date: 10/05/2020 CLINICAL DATA:  [place], found down, left arm and left leg weakness EXAM: CT HEAD WITHOUT CONTRAST TECHNIQUE: Contiguous axial images were obtained from the base of the skull through the vertex without intravenous contrast. COMPARISON:  07/06/2003 FINDINGS: Brain: There is a subacute right-sided subdural hematoma measuring up to 2.6 cm in maximal thickness. The majority of the subdural collection is decreased in attenuation, with scattered dependent higher attenuation more acute blood products identified posteriorly. There is significant mass effect and leftward midline shift, measuring  approximately 1.7 cm at the level of the septum pellucidum. No evidence of acute infarct. Effacement of the lateral ventricles due to mass effect. Remaining midline structures are unremarkable. Vascular: No hyperdense vessel or unexpected calcification. Skull: Normal. Negative for fracture or focal lesion. Sinuses/Orbits: No acute finding. Invagination of the left lamina papyracea consistent with chronic injury. Other: None. IMPRESSION: 1. Subacute right-sided subdural hematoma with significant mass effect and leftward midline shift measuring 1.7 cm. 2. No acute infarct. Critical Value/emergent results were called by telephone at the time of interpretation on 10/05/2020 at 5:55 pm to provider JOSEPH ZAMMIT, who verbally acknowledged these results. Electronically Signed   By: Randa Ngo M.D.   On: 10/05/2020 17:59   CT Cervical Spine Wo Contrast  Result Date: 10/05/2020 CLINICAL DATA:  Found down, left-sided weakness EXAM: CT CERVICAL SPINE WITHOUT CONTRAST TECHNIQUE: Multidetector CT imaging of the cervical spine was performed without intravenous contrast. Multiplanar CT image reconstructions were also generated. COMPARISON:  None. FINDINGS: Alignment: Alignment is anatomic. Skull base and vertebrae: No acute fracture. No primary bone lesion or focal pathologic process. Soft tissues and spinal canal: No prevertebral fluid or swelling. No visible canal hematoma. Prominent atherosclerosis at the carotid bifurcations. Disc levels:  Mild spondylosis at C5-6 and C6-7. Upper chest: Central airway is patent.  Lung apices are clear. Other: Reconstructed images demonstrate no additional findings. IMPRESSION: 1. No acute cervical spine fracture. Electronically Signed   By: Randa Ngo M.D.   On: 10/05/2020 17:58   DG Chest Portable 1 View  Result Date: 10/05/2020 CLINICAL DATA:  New left arm and leg weakness, coarse breath sounds EXAM: PORTABLE CHEST 1 VIEW COMPARISON:  Chest radiograph 06/15/2015 FINDINGS: The  heart is borderline  enlarged. The mediastinal contours are within normal limits. The lungs are well inflated. There is no focal consolidation or pulmonary edema. There is no pleural effusion or pneumothorax. There is no acute osseous abnormality. IMPRESSION: Borderline cardiomegaly. Otherwise, no radiographic evidence of acute cardiopulmonary process. Electronically Signed   By: Valetta Mole M.D.   On: 10/05/2020 16:43   DG Cerv Spine Flex&Ext Only  Result Date: 10/06/2020 CLINICAL DATA:  Injury EXAM: CERVICAL SPINE - FLEXION AND EXTENSION VIEWS ONLY COMPARISON:  None. FINDINGS: C1-C6 are well visualized on lateral view. Vertebral body heights are well-maintained. Disc spaces are well-maintained. No evidence of instability on flexion-extension views. Soft tissues are unremarkable. IMPRESSION: No evidence of instability on flexion and extension views. Electronically Signed   By: Yetta Glassman M.D.   On: 10/06/2020 13:22     Discharge Instructions: Discharge Instructions     Call MD for:  difficulty breathing, headache or visual disturbances   Complete by: As directed    No wound care   Complete by: As directed        Signed: Orvis Brill, MD 10/17/2020, 2:09 PM   Pager: [phone]

## 2020-10-17 NOTE — Progress Notes (Signed)
PMR Admission Coordinator Pre-Admission Assessment  Patient: Christopher Chapman is an 65 y.o., male MRN: [id number] DOB: 12/25/1955 Height: 6' 1" (185.4 cm) Weight: 101.7 kg  Insurance Information HMO:     PPO:      PCP:      IPA:      80/20:      OTHER:  PRIMARY: Medicaid of [place]      Policy#: [id number]      Subscriber: patient CM Name:        Phone#:       Fax#:   Pre-Cert#:        Employer: Disabled, not working Benefits:  Phone #: [phone]     Name: Checked in passport one source Eff. Date: Eligible 10/09/20     Deduct:        Out of Pocket Max:        Life Max:   CIR: 100% per coverage guidelines      SNF:   Outpatient:       Co-Pay:   Home Health:        Co-Pay:   DME:       Co-Pay:   Providers: those that accept medicaid for payment  SECONDARY:        Policy#:       Phone#:    Financial Counselor:        Phone#:    The "Data Collection Information Summary" for patients in Inpatient Rehabilitation Facilities with attached "Privacy Act Statement-Health Care Records" was provided and verbally reviewed with: N/A  Emergency Contact Information Contact Information   None on File     Current Medical History  Patient Admitting Diagnosis: SDH, R craniotomy  History of Present Illness: Christopher Chapman is a 65-year-old right-handed male with history of hypertension, prediabetes, gout, hyperlipidemia, chronic anemia, polysubstance/tobacco abuse, end-stage renal disease secondary to hypertension on hemodialysis for 5 years before receiving a DDRT(decreased donor renal transplantation) at [facility] 09/13/2016 followed by Dr. Goldsborough with [facility] and medical noncompliance.  Presented 10/05/2020 after being found down for unknown amount of time.  Admission chemistries hemoglobin 9.6, potassium 5.4, BUN 102, creatinine 17.95, CK 511, lactic acid 1.0, troponin 66, ammonia level 37, hemoglobin A1c 5.6.  Cranial CT scan showed subacute right-sided subdural hematoma with significant  mass-effect and leftward midline shift measuring 1.7 cm.  CT cervical spine negative.  Underwent right craniotomy for evacuation of subdural hematoma 10/05/2020 per Dr. David Jones.  Maintained on Keppra for seizure prophylaxis.  Nephrology services consulted placed on hemodialysis for progressive decline in renal function.  Hospital course follow-up cranial CT scan showed increasing right to left midline shift with increased effacement of the lateral ventricles and underwent subdural drain placement for recurrent right chronic subdural hematoma 10/14/2020 per Dr. David Jones..  Maintain on a regular diet.  Hemodialysis continues to follow plan is for outpatient hemodialysis Monday Wednesday Fridays with latest creatinine improved to 7.16.  Acute on chronic anemia latest hemoglobin 7.1.  Therapy evaluations completed due to patient decreased functional mobility was recommended for a comprehensive rehab program.     Complete NIHSS TOTAL: 0  Patient's medical record from [facility] has been reviewed by the rehabilitation admission coordinator and physician.  Past Medical History  Past Medical History:  Diagnosis Date   Anemia    Arthritis    CRF (chronic renal failure)    ESRD (end stage renal disease) (HCC)    Family history of anesthesia complication    [place] going   to sleep.   Gout    Hx of cocaine abuse (HCC)    Hyperlipidemia    Hypertension    Hyperuricemia    MRSA (methicillin resistant staph aureus) culture positive 07/2011   Parathyroid disease (HCC)    Hx of , No meds at present   Tobacco abuse    Tuberculosis 1985   Treated with imeds for a years.    Has the patient had major surgery during 100 days prior to admission? Yes  Family History   family history includes Diabetes in his father and sister; Healthy in his mother; Heart disease in his father and sister; Hypertension in his father; Other in his father.  Current Medications  Current Facility-Administered  Medications:    0.9 %  sodium chloride infusion, 100 mL, Intravenous, PRN, Jones, David S, MD   0.9 %  sodium chloride infusion, 100 mL, Intravenous, PRN, Jones, David S, MD   acetaminophen (TYLENOL) tablet 650 mg, 650 mg, Oral, Q4H PRN **OR** acetaminophen (TYLENOL) suppository 650 mg, 650 mg, Rectal, Q4H PRN, Jones, David S, MD   allopurinol (ZYLOPRIM) tablet 200 mg, 200 mg, Oral, Daily, Jones, David S, MD, 200 mg at 10/17/20 0954   alteplase (CATHFLO ACTIVASE) injection 2 mg, 2 mg, Intracatheter, Once PRN, Jones, David S, MD   amLODipine (NORVASC) tablet 5 mg, 5 mg, Oral, Daily, Jones, David S, MD, 5 mg at 10/17/20 0950   calcitRIOL (ROCALTROL) capsule 0.5 mcg, 0.5 mcg, Oral, Q M,W,F-HD, Jones, David S, MD, 0.5 mcg at 10/17/20 1140   Chlorhexidine Gluconate Cloth 2 % PADS 6 each, 6 each, Topical, Daily, Jones, David S, MD, 6 each at 10/17/20 0959   Darbepoetin Alfa (ARANESP) injection 60 mcg, 60 mcg, Intravenous, Q Mon-HD, Jones, David S, MD, 60 mcg at 10/10/20 1831   docusate sodium (COLACE) capsule 100 mg, 100 mg, Oral, BID PRN, Jones, David S, MD, 100 mg at 10/17/20 0950   hydrALAZINE (APRESOLINE) injection 10 mg, 10 mg, Intravenous, Q4H PRN, Jones, David S, MD   HYDROcodone-acetaminophen (NORCO/VICODIN) 5-325 MG per tablet 1 tablet, 1 tablet, Oral, Q4H PRN, Jones, David S, MD, 1 tablet at 10/14/20 1347   insulin aspart (novoLOG) injection 0-15 Units, 0-15 Units, Subcutaneous, TID WC, Jones, David S, MD, 2 Units at 10/16/20 0852   labetalol (NORMODYNE) injection 10-40 mg, 10-40 mg, Intravenous, Q10 min PRN, Jones, David S, MD   labetalol (NORMODYNE) tablet 300 mg, 300 mg, Oral, TID, Jones, David S, MD, 300 mg at 10/17/20 0951   levETIRAcetam (KEPPRA) tablet 250 mg, 250 mg, Oral, BID, Jones, David S, MD, 250 mg at 10/17/20 0955   lidocaine (PF) (XYLOCAINE) 1 % injection 5 mL, 5 mL, Intradermal, PRN, Jones, David S, MD   lidocaine-prilocaine (EMLA) cream 1 application, 1 application, Topical,  PRN, Jones, David S, MD   losartan (COZAAR) tablet 25 mg, 25 mg, Oral, Daily, Jones, David S, MD, 25 mg at 10/17/20 0950   menthol-cetylpyridinium (CEPACOL) lozenge 3 mg, 1 lozenge, Oral, PRN, Hoffman, Erik C, DO   morphine 2 MG/ML injection 1-2 mg, 1-2 mg, Intravenous, Q2H PRN, Jones, David S, MD, 2 mg at 10/14/20 1601   multivitamin (RENA-VIT) tablet 1 tablet, 1 tablet, Oral, QHS, Patel, Jay, MD   mycophenolate (MYFORTIC) EC tablet 360 mg, 360 mg, Oral, BID, Jones, David S, MD, 360 mg at 10/17/20 0955   ondansetron (ZOFRAN) tablet 4 mg, 4 mg, Oral, Q4H PRN **OR** ondansetron (ZOFRAN) injection 4 mg, 4 mg, Intravenous, Q4H PRN, Jones, David   S, MD, 4 mg at 10/06/20 1212   pantoprazole (PROTONIX) EC tablet 40 mg, 40 mg, Oral, QHS, Jones, David S, MD, 40 mg at 10/16/20 2126   pentafluoroprop-tetrafluoroeth (GEBAUERS) aerosol 1 application, 1 application, Topical, PRN, Jones, David S, MD   senna (SENOKOT) tablet 8.6 mg, 1 tablet, Oral, BID, Jones, David S, MD, 8.6 mg at 10/17/20 0950   sevelamer carbonate (RENVELA) tablet 800 mg, 800 mg, Oral, TID WC, Jones, David S, MD, 800 mg at 10/17/20 1140   tacrolimus (PROGRAF) capsule 2 mg, 2 mg, Oral, BID, Jones, David S, MD, 2 mg at 10/17/20 0953  Patients Current Diet:  Diet Order             Diet renal/carb modified with fluid restriction Diet-HS Snack? Nothing; Fluid restriction: 1200 mL Fluid; Room service appropriate? Yes; Fluid consistency: Thin  Diet effective now                   Precautions / Restrictions Precautions Precautions: Fall, Other (comment) Precaution Comments: Subdural drain, JP bulb Restrictions Weight Bearing Restrictions: No   Has the patient had 2 or more falls or a fall with injury in the past year? Yes  Prior Activity Level Community (5-7x/wk): Went out daily.  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  Not working.  Prior Functional Level Self Care: Did the patient need help bathing, dressing, using the toilet  or eating? Independent  Indoor Mobility: Did the patient need assistance with walking from room to room (with or without device)? Independent  Stairs: Did the patient need assistance with internal or external stairs (with or without device)? Independent  Functional Cognition: Did the patient need help planning regular tasks such as shopping or remembering to take medications? Independent  Patient Information Are you of Hispanic, Latino/a,or Spanish origin?: A. No, not of Hispanic, Latino/a, or Spanish origin What is your race?: B. Black or African American Do you need or want an interpreter to communicate with a doctor or health care staff?: 0. No  Patient's Response To:  Health Literacy and Transportation Is the patient able to respond to health literacy and transportation needs?: Yes Health Literacy - How often do you need to have someone help you when you read instructions, pamphlets, or other written material from your doctor or pharmacy?: Never In the past 12 months, has lack of transportation kept you from medical appointments or from getting medications?: No In the past 12 months, has lack of transportation kept you from meetings, work, or from getting things needed for daily living?: No  Home Assistive Devices / Equipment Home Assistive Devices/Equipment: None  Prior Device Use: Indicate devices/aids used by the patient prior to current illness, exacerbation or injury? None of the above  Current Functional Level Cognition  Overall Cognitive Status: Impaired/Different from baseline Current Attention Level: Focused Orientation Level: Oriented X4 Following Commands: Follows multi-step commands inconsistently Safety/Judgement: Decreased awareness of safety, Decreased awareness of deficits General Comments: Pt continues with decreased insight into deficits/safety, repeatedly asking if he could now get up by himself despite instruction he could not with the subdural drain.     Extremity Assessment (includes Sensation/Coordination)  Upper Extremity Assessment: Defer to OT evaluation LUE Deficits / Details: requires tactile or verbal cues to use LUE functionally, does not appreciate being told "what to do" so difficult to assess LUE Sensation: decreased proprioception LUE Coordination: decreased fine motor  Lower Extremity Assessment: LLE deficits/detail LLE Deficits / Details: Hip flexion 4/5, knee   extension and ankle dorsiflexion 5/5 LLE Sensation: decreased light touch, decreased proprioception LLE Coordination: decreased fine motor, decreased gross motor    ADLs  Overall ADL's : Needs assistance/impaired Eating/Feeding: Set up, Sitting Eating/Feeding Details (indicate cue type and reason): spillage noted and bacon in bed on arrival. pt unaware of time of day but had eaten breakfast prior to arrival Grooming: Wash/dry hands, Applying deodorant, Oral care, Minimal assistance Grooming Details (indicate cue type and reason): assist to sequence and attend to task Upper Body Bathing: Moderate assistance, Bed level Lower Body Bathing: Maximal assistance, Bed level Upper Body Dressing : Maximal assistance, Bed level Lower Body Dressing: Moderate assistance, Sitting/lateral leans Toilet Transfer: Maximal assistance, Squat-pivot, BSC Toilet Transfer Details (indicate cue type and reason): unable to safely attempt with +1 assist due to pt impulsivity Toileting- Clothing Manipulation and Hygiene: Sit to/from stand, Maximal assistance Functional mobility during ADLs: Moderate assistance General ADL Comments: pt very impulsive, poor safety awareness and insight into his deficits. verbal or tactile cues throughout, also requires assist for LLE during ambulation and LUE during functional task    Mobility  Overal bed mobility: Needs Assistance Bed Mobility: Supine to Sit Rolling: Min assist Supine to sit: Supervision Sit to supine: Mod assist General bed mobility  comments: close supervision    Transfers  Overall transfer level: Needs assistance Equipment used: Rolling walker (2 wheeled) Transfers: Sit to/from Stand Sit to Stand: Supervision Stand pivot transfers: Mod assist General transfer comment: Close supervision for safety, pt preferring for PT to "back up so he could do it for himself." Use of momentum to power up    Ambulation / Gait / Stairs / Wheelchair Mobility  Ambulation/Gait Ambulation/Gait assistance: Min guard, +2 safety/equipment Gait Distance (Feet): 20 Feet Assistive device: Rolling walker (2 wheeled) Gait Pattern/deviations: Decreased stride length, Step-to pattern, Step-through pattern General Gait Details: slow but steady pace, min guard +2 safety, improved left foot clearance Gait velocity: decreased    Posture / Balance Dynamic Sitting Balance Sitting balance - Comments: UTA, pt refusing EOB Balance Overall balance assessment: Needs assistance Sitting-balance support: Feet supported Sitting balance-Leahy Scale: Good Sitting balance - Comments: UTA, pt refusing EOB Postural control: Right lateral lean Standing balance support: Bilateral upper extremity supported Standing balance-Leahy Scale: Poor Standing balance comment: reliant on RW    Special needs/care consideration Continuous Drip IV  0.9% NS prn , Dialysis: Hemodialysis Monday, Wednesday, and Friday, Skin R scalp post op craniotomy with staples, and Special service needs n/a   Previous Home Environment (from acute therapy documentation) Living Arrangements: Parent, Other relatives Available Help at Discharge: Available PRN/intermittently Type of Home: House Home Layout: Multi-level Home Care Services: No Additional Comments: Pt reports he lives in a split level home with his 89 y.o. mother, whom he provides some care for (cognitive deficits), and his retired brother.  He was unable to provide further info re: home set up  Discharge Living Setting Plans  for Discharge Living Setting: House, Lives with (comment) (Lives with 86 yo mom and 62 yo brother.) Type of Home at Discharge: House Discharge Home Layout: Multi-level (Split level house) Alternate Level Stairs-Number of Steps: 4-6 steps Discharge Home Access: Stairs to enter Entrance Stairs-Rails: Left Entrance Stairs-Number of Steps: 5-6 steps Discharge Bathroom Shower/Tub: Walk-in shower, Door Discharge Bathroom Toilet: Standard Discharge Bathroom Accessibility: Yes How Accessible: Accessible via walker Does the patient have any problems obtaining your medications?: No  Social/Family/Support Systems Patient Roles: Other (Comment) (Has a mother and a brother.) Contact Information:   Mildred Wiltrout - mother - [phone] Anticipated Caregiver: mom and brother Ability/Limitations of Caregiver: Mom is 86 and is retired and can help some.  Brother works cutting grass but can also help after discharge. 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  Mom and brother do not drive.)  Goals Patient/Family Goal for Rehab: PT/OT supervision to min assist goals Expected length of stay: 10-14 days Pt/Family Agrees to Admission and willing to participate: Yes Program Orientation Provided & Reviewed with Pt/Caregiver Including Roles  & Responsibilities: Yes  Decrease burden of Care through IP rehab admission: N/A  Possible need for SNF placement upon discharge: Not planned  Patient Condition: I have reviewed medical records from [facility], spoken with CM, and patient and family member. I met with patient at the bedside and discussed via phone for inpatient rehabilitation assessment.  Patient will benefit from ongoing PT, OT, and SLP, can actively participate in 3 hours of therapy a day 5 days  of the week, and can make measurable gains during the admission.  Patient will also benefit from the coordinated team approach during an Inpatient Acute Rehabilitation admission.  The patient will receive intensive therapy as well as Rehabilitation physician, nursing, social worker, and care management interventions.  Due to bladder management, bowel management, safety, skin/wound care, disease management, medication administration, pain management, and patient education the patient requires 24 hour a day rehabilitation nursing.  The patient is currently min assist to min guard with mobility and basic ADLs.  Discharge setting and therapy post discharge at home with home health is anticipated.  Patient has agreed to participate in the Acute Inpatient Rehabilitation Program and will admit today.  Preadmission Screen Completed By: Eugenia M Logue with day of admit updates by Filomeno Cromley E Ayelet Gruenewald, 10/17/2020 1:20 PM ______________________________________________________________________   Discussed status with Dr. Kirsteins on 10/17/20  at 1:20 PM  and received approval for admission today.  Admission Coordinator:  Esmeralda Blanford E Karine Garn, PT, time 1:20 PM /Date 10/17/20    Assessment/Plan: Diagnosis:Right SDH Does the need for close, 24 hr/day Medical supervision in concert with the patient's rehab needs make it unreasonable for this patient to be served in a less intensive setting? Yes Co-Morbidities requiring supervision/potential complications: ESRD on HD, HTN, hx polysubstance abuse, anemia of chronic disease Due to bladder management, bowel management, safety, skin/wound care, disease management, medication administration, pain management, and patient education, does the patient require 24 hr/day rehab nursing? Yes Does the patient require coordinated care of a physician, rehab nurse, PT, OT, and SLP to address physical and functional deficits in the context of the above medical diagnosis(es)? Yes Addressing  deficits in the following areas: balance, endurance, locomotion, strength, transferring, bowel/bladder control, bathing, dressing, feeding, grooming, toileting, cognition, and psychosocial support Can the patient actively participate in an intensive therapy program of at least 3 hrs of therapy 5 days a week? Yes The potential for patient to make measurable gains while on inpatient rehab is good Anticipated functional outcomes upon discharge from inpatient rehab: modified independent and supervision PT, modified independent and supervision OT, modified independent and supervision SLP Estimated rehab length of stay to reach the above functional goals is: 10-14d Anticipated discharge destination: Home 10. Overall Rehab/Functional Prognosis: good   MD Signature: Andrew E. Kirsteins M.D.  Medical Group Fellow Am Acad of Phys Med and   Rehab Diplomate Am Board of Electrodiagnostic Med Fellow Am Board of Interventional Pain  

## 2020-10-17 NOTE — Progress Notes (Addendum)
Subjective:   Patient evaluated bedside. He does not endorse any specific complaints or concerns today and states that he feels well.  Objective:  Vital signs in last 24 hours: Vitals:   10/17/20 0500 10/17/20 0600 10/17/20 0800 10/17/20 0950  BP: 134/68 (!) 148/79 (!) 141/60 (!) 147/78  Pulse: 68 67 66 76  Resp: 17 18 15    Temp:   98.3 F (36.8 C)   TempSrc:   Axillary   SpO2: 98% 91% 98%   Weight: 101.7 kg     Height:        Physical Exam Constitutional:      General: He is not in acute distress.    Appearance: Normal appearance.  HENT:     Head:     Comments: Surgical incision is clean, dry, and intact    Mouth/Throat:     Mouth: Mucous membranes are moist.     Pharynx: Oropharynx is clear.  Eyes:     Extraocular Movements: Extraocular movements intact.     Pupils: Pupils are equal, round, and reactive to light.  Cardiovascular:     Rate and Rhythm: Normal rate and regular rhythm.     Pulses: Normal pulses.     Heart sounds: Normal heart sounds. No murmur heard.   No friction rub. No gallop.  Pulmonary:     Effort: Pulmonary effort is normal.     Breath sounds: Normal breath sounds. No wheezing, rhonchi or rales.  Abdominal:     General: Abdomen is flat. Bowel sounds are normal. There is no distension.     Palpations: Abdomen is soft.     Tenderness: There is no abdominal tenderness.  Musculoskeletal:     Right lower leg: No edema.     Left lower leg: No edema.     Comments:    Skin:    General: Skin is warm and dry.  Neurological:     Mental Status: He is alert and oriented to person, place, and time.     Comments: Cranial nerves are grossly intact.  No new focal deficit.  Psychiatric:        Mood and Affect: Mood normal.        Behavior: Behavior normal.    Assessment/Plan:  Active Problems:   Subdural hematoma   Altered mental status   ABLA (acute blood loss anemia)   Stage 4 chronic kidney disease (HCC)   S/P craniotomy  Christopher Chapman  is a 65 y/o M with a PMHx HTN, ETOH Abuse, cocaine abuse, DM, and ESRD w/kidney transplant, who presented for AMS and was found to have a SDH underwent craniotomy with evacuation 0-53 complicated by reaccumulation and repeat drainage on 10/7.   Subacute R SDH now S/P R Craniotomy and Evacuation:  Patient is doing well this morning. He is in good spirits. CT head with decreased volume of SDH and improved midline shift. - Appreciate neurosurgery recommendations - Continue Frequent neuro checks - Continue keppra 250 mg - Continue PT/OT while admitted, anticipated discharge to CIR  Hx CKD S/P Renal Transplant  w/ Progressive ESRD Being followed by Nephrology, receiving iHD. CLIP process: [facility] MWF 6 am (patient will need to go the day prior to complete paperwork). - Continue mycophenolate and tacro per nephrology - Continue BP control  - HD per nephrology - Avoid nephrotoxins   HTN:   - Amlodipine 5 mg daily - Labetolol 300 mg TID - PRN hydralazine  DM:  - Continue SSI   Acute  Blood Loss Anemia Platelet Dysfunction in the Setting of Uremia:  Patient s/p 1 PRBC 9/28. - Trend CBC - Transfuse for Hgb < 7    Hx Polysubstance Abuse:  - Will need substance abuse counseling   Prior to Admission Living Arrangement: Home Anticipated Discharge Location: Appropriate candidate for CIR Barriers to Discharge: Continued medical and neurosurgical treatment Dispo: Anticipated discharge postop management  Christopher Brill, MD 10/17/2020, 10:27 AM After 5pm on weekdays and 1pm on weekends: On Call pager [phone]

## 2020-10-17 NOTE — Progress Notes (Signed)
Inpatient Rehab Admissions Coordinator:   I have a bed available for pt to admit today.  Received approval from Dr. Lorin Glass.  Will let pt/family and TOC know.   Shann Medal, PT, DPT Admissions Coordinator [phone] 10/17/20  1:31 PM

## 2020-10-17 NOTE — Progress Notes (Signed)
Report has been called to CIR and room is ready. Patient is packed and ready to transfer.

## 2020-10-17 NOTE — Progress Notes (Signed)
OT Cancellation Note  Patient Details Name: Christopher Chapman MRN: [id number] DOB: 1955/07/14   Cancelled Treatment:    Reason Eval/Treat Not Completed: Other (comment) Dr Ella Bodo talking with pt. Plan is for CIR admit today. Will defer further  OT to CIR.  Ramond Dial, OT/L   Acute OT Clinical Specialist Acute Rehabilitation Services Pager [phone] Office [phone]  10/17/2020, 2:22 PM

## 2020-10-17 NOTE — Progress Notes (Signed)
Patient ID: Christopher Chapman, male   DOB: 02-Nov-1955, 65 y.o.   MRN: [id number]  [facility] Progress Note   Assessment/ Plan:   1.  Chronic subdural hematoma: Status post evacuation originally on 9/28 with repeat evacuation surgery performed 10/7.  Stable from neurosurgical standpoint to transfer out of ICU at this time.  We will continue to hold heparin at dialysis. 2. 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  I will order for hemodialysis again today to transition to a Monday Wednesday Friday schedule anticipating discharge in the near future.  He has been accepted to [facility] on a Monday/Wednesday/Friday schedule upon discharge. 3. Anemia: Low hemoglobin and hematocrit without overt blood loss status post SDH evacuation.  Continue ESA. 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 5. Nutrition: Continue renal diet with oral nutritional supplements and renal multivitamin. 6. Hypertension: Blood pressure under decent control, will continue to monitor with hemodialysis and ongoing antihypertensive therapy.  Subjective:   Reports to be feeling fair and denies any chest pain or shortness of breath.  Some mild headache.   Objective:   BP (!) 147/78   Pulse 76   Temp 98.3 F (36.8 C) (Axillary)   Resp 15   Ht 6\' 1"  (1.854 m)   Wt 101.7 kg   SpO2 98%   BMI 29.58 kg/m   Physical Exam: Gen: Comfortably sitting up in recliner, watching television.  Craniotomy surgical scars noted over right side of head CVS: Pulse regular rhythm, normal rate, S1 and S2 normal Resp: Clear to auscultation bilaterally, no rales/rhonchi Abd: Soft, obese, nontender, bowel sounds normal Ext: Trace lower extremity edema with left upper arm brachiocephalic fistula  Labs: BMET Recent Labs  Lab 10/11/20 0241  10/12/20 0236 10/13/20 0309 10/14/20 0307 10/15/20 0154 10/16/20 0625 10/17/20 0221  NA 132* 131* 134* 133* 131* 134* 133*  K 3.7 3.5 3.7 3.9 4.4 4.1 3.9  CL 97* 97* 98 98 98 98 97*  CO2 27 25 25 25 22 23 27   GLUCOSE 131* 117* 116* 108* 156* 100* 102*  BUN 42* 53* 36* 48* 56* 63* 37*  CREATININE 5.92* 7.58* 6.25* 7.69* 8.63* 9.84* 7.16*  CALCIUM 7.2* 6.3* 7.4* 6.9* 6.9* 6.7* 7.3*  PHOS  --   --   --   --   --   --  4.3   CBC Recent Labs  Lab 10/14/20 0307 10/15/20 0154 10/16/20 0625 10/17/20 0221  WBC 10.3 11.3* 8.4 8.4  HGB 7.6* 7.9* 7.4* 7.1*  HCT 24.5* 25.3* 23.4* 24.0*  MCV 89.1 88.2 88.6 91.3  PLT 174 220 197 197      Medications:     allopurinol  200 mg Oral Daily   amLODipine  5 mg Oral Daily   calcitRIOL  0.5 mcg Oral Q M,W,F-HD   Chlorhexidine Gluconate Cloth  6 each Topical Daily   darbepoetin (ARANESP) injection - DIALYSIS  60 mcg Intravenous Q Mon-HD   insulin aspart  0-15 Units Subcutaneous TID WC   labetalol  300 mg Oral TID   levETIRAcetam  250 mg Oral BID   losartan  25 mg Oral Daily   mycophenolate  360 mg Oral BID   pantoprazole  40 mg Oral QHS   senna  1 tablet Oral BID   sevelamer carbonate  800 mg Oral TID WC   tacrolimus  2 mg  Oral BID     Elmarie Shiley, MD 10/17/2020, 10:29 AM

## 2020-10-17 NOTE — Progress Notes (Signed)
Chart reviewed. Pt's admissions to CIR noted. Pt has been clipped to [facility] on MWF. Pt will need to arrive at 5:40 am for 6:00 chair time. Pt will need to complete paperwork at clinic the day before first treatment. TOC staff aware pt to need transport to/from HD at d/c. Will continue to follow and assist as needed.   Melven Sartorius Renal Navigator [phone]

## 2020-10-17 NOTE — Progress Notes (Signed)
Multiple arterial alarms. BFR decreased. Needles have been adjusted.

## 2020-10-18 ENCOUNTER — Inpatient Hospital Stay (HOSPITAL_COMMUNITY): Payer: Medicaid Other

## 2020-10-18 DIAGNOSIS — Z992 Dependence on renal dialysis: Secondary | ICD-10-CM

## 2020-10-18 DIAGNOSIS — R609 Edema, unspecified: Secondary | ICD-10-CM

## 2020-10-18 DIAGNOSIS — F1910 Other psychoactive substance abuse, uncomplicated: Secondary | ICD-10-CM | POA: Diagnosis not present

## 2020-10-18 DIAGNOSIS — S065XAA Traumatic subdural hemorrhage with loss of consciousness status unknown, initial encounter: Secondary | ICD-10-CM | POA: Diagnosis not present

## 2020-10-18 DIAGNOSIS — I10 Essential (primary) hypertension: Secondary | ICD-10-CM | POA: Diagnosis not present

## 2020-10-18 DIAGNOSIS — N186 End stage renal disease: Secondary | ICD-10-CM | POA: Diagnosis not present

## 2020-10-18 DIAGNOSIS — M7989 Other specified soft tissue disorders: Secondary | ICD-10-CM

## 2020-10-18 LAB — BPAM RBC
Blood Product Expiration Date: 202210292359
Blood Product Expiration Date: 202211062359
Unit Type and Rh: 8400
Unit Type and Rh: 8400

## 2020-10-18 LAB — TYPE AND SCREEN
ABO/RH(D): AB POS
Antibody Screen: NEGATIVE
Unit division: 0
Unit division: 0

## 2020-10-18 LAB — GLUCOSE, CAPILLARY
Glucose-Capillary: 98 mg/dL (ref 70–99)
Glucose-Capillary: 106 mg/dL — ABNORMAL HIGH (ref 70–99)
Glucose-Capillary: 102 mg/dL — ABNORMAL HIGH (ref 70–99)
Glucose-Capillary: 92 mg/dL (ref 70–99)

## 2020-10-18 MED ORDER — ATORVASTATIN CALCIUM 40 MG PO TABS
40.0000 mg | ORAL_TABLET | Freq: Every day | ORAL | Status: DC
  Administered 2020-10-18 – 2020-10-27 (×10): 40 mg via ORAL
  Filled 2020-10-18 (×10): qty 1

## 2020-10-18 MED ORDER — CHLORHEXIDINE GLUCONATE CLOTH 2 % EX PADS
6.0000 | MEDICATED_PAD | Freq: Every day | CUTANEOUS | Status: DC
  Administered 2020-10-20 – 2020-10-23 (×3): 6 via TOPICAL

## 2020-10-18 NOTE — Discharge Instructions (Addendum)
Inpatient Rehab Discharge Instructions  Christopher Chapman Discharge date and time: No discharge date for patient encounter.   Activities/Precautions/ Functional Status: Activity: activity as tolerated Diet: renal diet Wound Care: Routine skin checks Functional status:  ___ No restrictions     ___ Walk up steps independently ___ 24/7 supervision/assistance   ___ Walk up steps with assistance ___ Intermittent supervision/assistance  ___ Bathe/dress independently ___ Walk with walker     __x_ Bathe/dress with assistance ___ Walk Independently    ___ Shower independently ___ Walk with assistance    ___ Shower with assistance ___ No alcohol     ___ Return to work/school ________   COMMUNITY REFERRALS UPON DISCHARGE:     Outpatient: PT      OT     ST                  Agency: [facility]              Address:[address], [place], [place] [address]            Phone:  [phone]             Appointment Date/Time: *please expect follow-up within 7-10 business days to schedule your appointment. If you have not received follow-up, be sure to contact the site directly.*    GENERAL COMMUNITY RESOURCES FOR PATIENT/FAMILY: 1) Please contact [facility] to register for transportation services [phone]. This transportation can be used through 11/08/2020.   2) You have been pre-certified to use [facility] for dialysis treatment. Please 72 hrs in advance to schedule your reservation [phone]. You are arranged for the following transportation date(s)/time(s):  Hospital discharge pick-up on 10/27/20 between 11am-11:30am. Transportation will take you to [facility] Location) to complete new patient paperwork, with pick up between 12pm-12:30pm.   Dialysis scheduled pick-up 5am-5:30pm for Friday (10/22) and  Monday (10/24). You will need to call on Friday to schedule transportation for next week as reservations can only be made seven days out.   3) Your insurance will change  from traditional Medicaid to [facility] on 11/08/2020. Please contact [facility] transportation to use for dialysis by calling [phone]. If you choose to continue with [facility], you will pay $2 per trip.   4) Please contact [facility] for any assistance with medical appointments to a [employer] by calling [phone]. Be sure to call 3 days in advance for a scheduled appointment.   5) You have been set up for personal care services [facility]) with [employer] [phone]. A referral will be sent to the following locations: 1) [facility], 2) [facility], and 3) [facility] are approved for an initial 60 hours for the month. Hours will be adjusted once a nursing evaluation has been completed. Please contact [facility] to check the status of the referral and service initiation.    Special Instructions: No driving smoking or alcohol  Continue hemodialysis as directed  Hold aspirin until further notice due to subdural hematoma   My questions have been answered and I understand these instructions. I will adhere to these goals and the provided educational materials after my discharge from the hospital.  Patient/Caregiver Signature _______________________________ Date __________  Clinician Signature _______________________________________ Date __________  Please bring this form and your medication list with you to all your follow-up doctor's appointments.

## 2020-10-18 NOTE — Progress Notes (Signed)
Inpatient Rehabilitation  Patient information reviewed and entered into eRehab system by Christofer Shen M. Stacey Maura, M.A., CCC/SLP, PPS Coordinator.  Information including medical coding, functional ability and quality indicators will be reviewed and updated through discharge.    

## 2020-10-18 NOTE — H&P (Signed)
Physical Medicine and Rehabilitation Admission H&P        Chief Complaint  Patient presents with   Altered Mental Status  : HPI: Christopher Chapman is a 65 year old right-handed male with history of hypertension, prediabetes, gout, hyperlipidemia, chronic anemia, polysubstance/tobacco abuse, end-stage renal disease secondary to hypertension on hemodialysis for 5 years before receiving a DDRT(decreased donor renal transplantation) at [facility] 09/13/2016 followed by Dr. Moshe Cipro with [facility] and medical noncompliance.  Per chart review patient lives with his elderly mother who has early dementia as well as his brother.  Independent prior to admission.  Presented 10/05/2020 after being found down for unknown amount of time.  Admission chemistries hemoglobin 9.6, potassium 5.4, BUN 102, creatinine 17.95, CK 511, lactic acid 1.0, troponin 66, ammonia level 37, hemoglobin A1c 5.6.  Cranial CT scan showed subacute right-sided subdural hematoma with significant mass-effect and leftward midline shift measuring 1.7 cm.  CT cervical spine negative.  Underwent right craniotomy for evacuation of subdural hematoma 10/05/2020 per Dr. Sherley Bounds.  Maintained on Keppra for seizure prophylaxis.  Nephrology services consulted placed on hemodialysis for progressive decline in renal function.  Hospital course follow-up cranial CT scan showed increasing right to left midline shift with increased effacement of the lateral ventricles and underwent subdural drain placement for recurrent right chronic subdural hematoma 10/14/2020 per Dr. Sherley Bounds..  Maintain on a regular diet.  Hemodialysis continues to follow plan is for outpatient hemodialysis Monday Wednesday Fridays with latest creatinine improved to 7.16.  Acute on chronic anemia latest hemoglobin 7.1.  Therapy evaluations completed due to patient decreased functional mobility was admitted for a comprehensive rehab program.   Review of Systems  Constitutional:  Negative for  chills and fever.  HENT:  Negative for hearing loss.   Eyes:  Negative for blurred vision and double vision.  Respiratory:  Negative for cough and shortness of breath.   Cardiovascular:  Positive for leg swelling. Negative for chest pain and palpitations.  Gastrointestinal:  Positive for constipation. Negative for heartburn, nausea and vomiting.  Genitourinary:  Negative for dysuria, flank pain and hematuria.  Musculoskeletal:  Positive for joint pain and myalgias.  Skin:  Negative for rash.  Psychiatric/Behavioral:  The patient has insomnia.   All other systems reviewed and are negative.     Past Medical History:  Diagnosis Date   Anemia     Arthritis     CRF (chronic renal failure)     ESRD (end stage renal disease) ([place])     Family history of anesthesia complication      [place] going to sleep.   Gout     Hx of cocaine abuse ([place])     Hyperlipidemia     Hypertension     Hyperuricemia     MRSA (methicillin resistant staph aureus) culture positive 07/2011   Parathyroid disease ([place])      Hx of , No meds at present   Tobacco abuse     Tuberculosis 1985    Treated with imeds for a years.         Past Surgical History:  Procedure Laterality Date   ANGIOPLASTY   08/09/2011    Procedure: ANGIOPLASTY;  Surgeon: Angelia Mould, MD;  Location: [facility]UR[facility]S;  Service: Vascular;  Laterality: Left;  Left Braciocephalic Fistula using Vascu-Guard Patch   APPENDECTOMY   1980   [place] Left 2010   CRANIOTOMY Right 10/05/2020    Procedure: CRANIOTOMY HEMATOMA EVACUATION SUBDURAL;  Surgeon: Sherley Bounds  S, MD;  Location: [facility];  Service: Neurosurgery;  Laterality: Right;   INCISION AND DRAINAGE   12/2012    sebacebus cyst, infected   MANDIBLE FRACTURE SURGERY       [name] N/A 07/16/2011    Procedure: Earney Mallet;  Surgeon: Angelia Mould, MD;  Location: [facility] CATH LAB;  Service: Cardiovascular;  Laterality: N/A;         Family History  Problem Relation Age  of Onset   Diabetes Father     Heart disease Father     Hypertension Father     Other Father          amputation   Healthy Mother     Diabetes Sister     Heart disease Sister     Colon cancer Neg Hx     Stomach cancer Neg Hx     Rectal cancer Neg Hx     Esophageal cancer Neg Hx     Liver cancer Neg Hx     Colon polyps Neg Hx      Social History:  reports that he has been smoking cigarettes. He started smoking about 4 years ago. He has a 3.00 pack-year smoking history. He has never used smokeless tobacco. He reports current alcohol use. He reports that he does not use drugs. Allergies: No Known Allergies       Medications Prior to Admission  Medication Sig Dispense Refill   allopurinol (ZYLOPRIM) 100 MG tablet Take 200 mg by mouth daily.        amLODipine (NORVASC) 5 MG tablet Take 1 tablet (5 mg total) by mouth daily. 30 tablet 11   ASPIRIN LOW DOSE 81 MG EC tablet TAKE 1 TABLET(81 MG) BY MOUTH DAILY. SWALLOW WHOLE (Patient taking differently: Take 81 mg by mouth daily.) 90 tablet 0   atorvastatin (LIPITOR) 40 MG tablet Take 1 tablet (40 mg total) by mouth daily. 30 tablet 11   doxazosin (CARDURA) 8 MG tablet Take 8 mg by mouth at bedtime.       furosemide (LASIX) 40 MG tablet Take 40 mg by mouth 2 (two) times daily.       labetalol (NORMODYNE) 300 MG tablet Take 1 tablet (300 mg total) by mouth 3 (three) times daily. (Patient taking differently: Take 300 mg by mouth 2 (two) times daily.) 90 tablet 0   mycophenolate (MYFORTIC) 180 MG EC tablet Take 360 mg by mouth 2 (two) times daily.       tacrolimus (PROGRAF) 1 MG capsule Take 4 mg by mouth 2 (two) times daily.        ACCU-CHEK AVIVA PLUS test strip USE 1 STRIP DAILY TO TEST BLOOD SUGAR   0   Blood Glucose Monitoring Suppl (ACCU-CHEK AVIVA PLUS) w/Device KIT USE AS INSTRUCTED; PLEASE CHECK BLOOD SUGAR ONCE A DAY 1 2 HRS AFTER MEALS   0   Lancets (ACCU-CHEK MULTICLIX) lancets Inject into the skin.       metroNIDAZOLE (FLAGYL) 500 MG  tablet Take 1 tablet (500 mg total) by mouth 2 (two) times daily. (Patient not taking: No sig reported) 14 tablet 0      Drug Regimen Review Drug regimen was reviewed and remains appropriate with no significant issues identified   Home: Home Living Family/patient expects to be discharged to:: Private residence Living Arrangements: Parent, Other relatives Available Help at Discharge: Available PRN/intermittently Type of Home: House Home Layout: Multi-level Additional Comments: Pt reports he lives in a split level home with his 22 y.o.  mother, whom he provides some care for (cognitive deficits), and his retired brother.  He was unable to provide further info re: home set up   Functional History: Prior Function Level of Independence: Independent Comments: Pt denies h/o falls.  he does not use AD for ambulation.  He reports he drives, and is a retired Manufacturing systems engineer of all trades, Restaurant manager, fast food of none"   Functional Status:  Mobility: Bed Mobility Overal bed mobility: Needs Assistance Bed Mobility: Supine to Sit Rolling: Min assist Supine to sit: Supervision Sit to supine: Mod assist General bed mobility comments: close supervision Transfers Overall transfer level: Needs assistance Equipment used: Rolling walker (2 wheeled) Transfers: Sit to/from Stand Sit to Stand: Supervision Stand pivot transfers: Mod assist General transfer comment: Close supervision for safety, pt preferring for PT to "back up so he could do it for himself." Use of momentum to power up Ambulation/Gait Ambulation/Gait assistance: Min guard, +2 safety/equipment Gait Distance (Feet): 20 Feet Assistive device: Rolling walker (2 wheeled) Gait Pattern/deviations: Decreased stride length, Step-to pattern, Step-through pattern General Gait Details: slow but steady pace, min guard +2 safety, improved left foot clearance Gait velocity: decreased   ADL: ADL Overall ADL's : Needs assistance/impaired Eating/Feeding: Set up,  Sitting Eating/Feeding Details (indicate cue type and reason): spillage noted and bacon in bed on arrival. pt unaware of time of day but had eaten breakfast prior to arrival Grooming: Wash/dry hands, Applying deodorant, Oral care, Minimal assistance Grooming Details (indicate cue type and reason): assist to sequence and attend to task Upper Body Bathing: Moderate assistance, Bed level Lower Body Bathing: Maximal assistance, Bed level Upper Body Dressing : Maximal assistance, Bed level Lower Body Dressing: Moderate assistance, Sitting/lateral leans Toilet Transfer: Maximal assistance, Squat-pivot, BSC Toilet Transfer Details (indicate cue type and reason): unable to safely attempt with +1 assist due to pt impulsivity Toileting- Clothing Manipulation and Hygiene: Sit to/from stand, Maximal assistance Functional mobility during ADLs: Moderate assistance General ADL Comments: pt very impulsive, poor safety awareness and insight into his deficits. verbal or tactile cues throughout, also requires assist for LLE during ambulation and LUE during functional task   Cognition: Cognition Overall Cognitive Status: Impaired/Different from baseline Orientation Level: Oriented X4 Cognition Arousal/Alertness: Awake/alert Behavior During Therapy: Impulsive Overall Cognitive Status: Impaired/Different from baseline Area of Impairment: Memory, Following commands, Safety/judgement, Awareness, Problem solving Orientation Level: Disoriented to, Situation (unsure how "I broke my head open") Current Attention Level: Focused Memory: Decreased recall of precautions Following Commands: Follows multi-step commands inconsistently Safety/Judgement: Decreased awareness of safety, Decreased awareness of deficits Awareness: Intellectual Problem Solving: Requires verbal cues General Comments: Pt continues with decreased insight into deficits/safety, repeatedly asking if he could now get up by himself despite instruction  he could not with the subdural drain.   Physical Exam: Blood pressure 134/68, pulse 68, temperature 98 F (36.7 C), temperature source Oral, resp. rate 17, height _0  (1.854 m), weight 101.7 kg, SpO2 98 %. Physical Exam HENT:     Head:     Comments: Right  parietal Craniotomy site clean and dry.Clips intact Neurological:     Comments: Patient is alert.  Sitting up in bed.  Makes eye contact with examiner.  Provides name and age.  [place]. Poor awareness of deficits, denies problem with balance   General: No acute distress Mood and affect are appropriate Heart: Regular rate and rhythm no rubs murmurs or extra sounds Lungs: Clear to auscultation, breathing unlabored, no rales or wheezes Abdomen: Positive bowel sounds, soft nontender  to palpation, nondistended Extremities: No clubbing, cyanosis, or edema Skin: No evidence of breakdown, no evidence of rash Neurologic: Cranial nerves II through XII intact, motor strength is 5/5 in bilateral deltoid, bicep, tricep, grip, hip flexor, knee extensors, ankle dorsiflexor and plantar flexor Sensory exam normal sensation to light touch  in bilateral upper and lower extremities Cerebellar exam normal finger to nose to finger as well as heel to shin in bilateral upper and lower extremities Musculoskeletal: Full range of motion in all 4 extremities. No joint swelling    Lab Results Last 48 Hours        Results for orders placed or performed during the hospital encounter of 10/05/20 (from the past 48 hour(s))  Glucose, capillary     Status: Abnormal    Collection Time: 10/15/20  8:18 AM  Result Value Ref Range    Glucose-Capillary 176 (H) 70 - 99 mg/dL      Comment: Glucose reference range applies only to samples taken after fasting for at least 8 hours.  Glucose, capillary     Status: Abnormal    Collection Time: 10/15/20 11:45 AM  Result Value Ref Range    Glucose-Capillary 111 (H) 70 - 99 mg/dL      Comment: Glucose reference  range applies only to samples taken after fasting for at least 8 hours.  Glucose, capillary     Status: Abnormal    Collection Time: 10/15/20  5:14 PM  Result Value Ref Range    Glucose-Capillary 137 (H) 70 - 99 mg/dL      Comment: Glucose reference range applies only to samples taken after fasting for at least 8 hours.  Glucose, capillary     Status: Abnormal    Collection Time: 10/15/20  9:57 PM  Result Value Ref Range    Glucose-Capillary 135 (H) 70 - 99 mg/dL      Comment: Glucose reference range applies only to samples taken after fasting for at least 8 hours.  CBC     Status: Abnormal    Collection Time: 10/16/20  6:25 AM  Result Value Ref Range    WBC 8.4 4.0 - 10.5 K/uL    RBC 2.64 (L) 4.22 - 5.81 MIL/uL    Hemoglobin 7.4 (L) 13.0 - 17.0 g/dL    HCT 23.4 (L) 39.0 - 52.0 %    MCV 88.6 80.0 - 100.0 fL    MCH 28.0 26.0 - 34.0 pg    MCHC 31.6 30.0 - 36.0 g/dL    RDW 16.4 (H) 11.5 - 15.5 %    Platelets 197 150 - 400 K/uL    nRBC 0.0 0.0 - 0.2 %      Comment: Performed at [facility] [address] [place], [place] [address]  Basic metabolic panel     Status: Abnormal    Collection Time: 10/16/20  6:25 AM  Result Value Ref Range    Sodium 134 (L) 135 - 145 mmol/L    Potassium 4.1 3.5 - 5.1 mmol/L    Chloride 98 98 - 111 mmol/L    CO2 23 22 - 32 mmol/L    Glucose, Bld 100 (H) 70 - 99 mg/dL      Comment: Glucose reference range applies only to samples taken after fasting for at least 8 hours.    BUN 63 (H) 8 - 23 mg/dL    Creatinine, Ser 9.84 (H) 0.61 - 1.24 mg/dL    Calcium 6.7 (L) 8.9 - 10.3 mg/dL  GFR, Estimated 5 (L) >60 mL/min      Comment: (NOTE) Calculated using the CKD-EPI Creatinine Equation (2021)      Anion gap 13 5 - 15      Comment: Performed at [facility], [place] [address], [place], [place] [address]  Glucose, capillary     Status: Abnormal    Collection Time: 10/16/20 12:08 PM  Result Value Ref Range    Glucose-Capillary 121 (H) 70 - 99  mg/dL      Comment: Glucose reference range applies only to samples taken after fasting for at least 8 hours.  Glucose, capillary     Status: None    Collection Time: 10/16/20 10:03 PM  Result Value Ref Range    Glucose-Capillary 93 70 - 99 mg/dL      Comment: Glucose reference range applies only to samples taken after fasting for at least 8 hours.  Renal function panel     Status: Abnormal    Collection Time: 10/17/20  2:21 AM  Result Value Ref Range    Sodium 133 (L) 135 - 145 mmol/L    Potassium 3.9 3.5 - 5.1 mmol/L    Chloride 97 (L) 98 - 111 mmol/L    CO2 27 22 - 32 mmol/L    Glucose, Bld 102 (H) 70 - 99 mg/dL      Comment: Glucose reference range applies only to samples taken after fasting for at least 8 hours.    BUN 37 (H) 8 - 23 mg/dL    Creatinine, Ser 7.16 (H) 0.61 - 1.24 mg/dL    Calcium 7.3 (L) 8.9 - 10.3 mg/dL    Phosphorus 4.3 2.5 - 4.6 mg/dL    Albumin 2.2 (L) 3.5 - 5.0 g/dL    GFR, Estimated 8 (L) >60 mL/min      Comment: (NOTE) Calculated using the CKD-EPI Creatinine Equation (2021)      Anion gap 9 5 - 15      Comment: Performed at [facility] [address], [place], [place] [address]  CBC     Status: Abnormal    Collection Time: 10/17/20  2:21 AM  Result Value Ref Range    WBC 8.4 4.0 - 10.5 K/uL    RBC 2.63 (L) 4.22 - 5.81 MIL/uL    Hemoglobin 7.1 (L) 13.0 - 17.0 g/dL    HCT 24.0 (L) 39.0 - 52.0 %    MCV 91.3 80.0 - 100.0 fL    MCH 27.0 26.0 - 34.0 pg    MCHC 29.6 (L) 30.0 - 36.0 g/dL    RDW 16.2 (H) 11.5 - 15.5 %    Platelets 197 150 - 400 K/uL    nRBC 0.0 0.0 - 0.2 %      Comment: Performed at [facility] [address] [place], [place] [address]       Imaging Results (Last 48 hours)  CT HEAD WO CONTRAST   Result Date: 10/15/2020 CLINICAL DATA:  65 year old male postoperative day 1 subdural drain placement for recurrent right side subdural hematoma. EXAM: CT HEAD WITHOUT CONTRAST TECHNIQUE: Contiguous axial images were obtained  from the base of the skull through the vertex without intravenous contrast. COMPARISON:  Head CT 10/12/2020, 10/05/2020. FINDINGS: Brain: Right side subdural drain now in place. Increased right side pneumocephalus but decreased volume of mixed density subdural hematoma since 10/12/2020. Decreased leftward midline shift from up to 10 mm at the anterior septum pellucidum previously now 4-5 mm. Improved patency  of the right lateral ventricle. No ventriculomegaly. No cortically based acute infarct identified. Basilar cisterns remain patent. Vascular: Mild Calcified atherosclerosis at the skull base. Skull: Right side craniotomy.  No new osseous abnormality. Sinuses/Orbits: Visualized paranasal sinuses and mastoids are stable and well aerated. Other: Postoperative changes to the right scalp. Orbits soft tissues remain negative. IMPRESSION: 1. Right side subdural placed with decreased volume of mixed density right subdural hematoma since 10/12/2020. Mild subsequent pneumocephalus. Leftward midline shift decreased from up to 10 mm to 4 mm. Improved patency of the right lateral ventricle. 2. No new intracranial abnormality. Electronically Signed   By: Genevie Ann M.D.   On: 10/15/2020 08:26             Medical Problem List and Plan: 1.  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  Drains discontinued 10/16/2020             -patient may not shower             -ELOS/Goals: 7-10d 2.  Antithrombotics: -DVT/anticoagulation: SCDs.  Check vascular study             -antiplatelet therapy: N/A 3. Pain Management: Hydrocodone as needed 4. Mood: Provide emotional support             -antipsychotic agents: N/A 5. Neuropsych: This patient is capable of making decisions on his own behalf. 6. Skin/Wound Care: Routine skin checks 7. Fluids/Electrolytes/Nutrition:  Routine in and outs with follow-up chemistries 8.  Seizure prophylaxis.  Keppra 250 mg twice daily , no seizure activity should be able to stop prior to d/c from CIR 9.  End-stage renal disease with decreased donor renal transplantation 09/13/2016.  Continue hemodialysis as directed 10.  Acute on chronic anemia.  Continue Aranesp 11.  Hypertension.  Norvasc 5 mg daily, labetalol 300 mg 3 times daily, Cozaar 25 mg daily.  Monitor with increased mobility 12.  Prediabetes.  Hemoglobin A1c 5.6.  SSI 13.  History of polysubstance abuse.  Counseling, states he has being using ETOH and tobacco but not other substances     Cathlyn Parsons, PA-C 10/17/2020  "I have personally performed a face to face diagnostic evaluation of this patient.  Additionally, I have reviewed and concur with the physician assistant's documentation above." Charlett Blake M.D. [place] [employer] [employer] Fellow [place] Board of Interventional Pain   The patient's status has not changed from the original H&P.  Any changes in documentation from the acute care chart have been noted above.  Meredith Staggers, MD, [facility]

## 2020-10-18 NOTE — Evaluation (Signed)
Physical Therapy Assessment and Plan  Patient Details  Name: Christopher Chapman MRN: [id number] Date of Birth: [birth year]  PT Diagnosis: Abnormal posture, Abnormality of gait, Cognitive deficits, Difficulty walking, Impaired cognition, and Muscle weakness Rehab Potential: Good ELOS: 7-10 days   Today's Date: 10/18/2020 PT Individual Time: 1000-1053 PT Individual Time Calculation (min): 53 min    Hospital Problem: Principal Problem:   Subdural hematoma Active Problems:   Traumatic subdural hematoma   Past Medical History:  Past Medical History:  Diagnosis Date   Anemia    Arthritis    CRF (chronic renal failure)    ESRD (end stage renal disease) ([place])    Family history of anesthesia complication    [place] going to sleep.   Gout    Hx of cocaine abuse ([place])    Hyperlipidemia    Hypertension    Hyperuricemia    MRSA (methicillin resistant staph aureus) culture positive 07/2011   Parathyroid disease ([place])    Hx of , No meds at present   Tobacco abuse    Tuberculosis 1985   Treated with imeds for a years.   Past Surgical History:  Past Surgical History:  Procedure Laterality Date   ANGIOPLASTY  08/09/2011   Procedure: ANGIOPLASTY;  Surgeon: Angelia Mould, MD;  Location: [place] [facility]URO ORS;  Service: Vascular;  Laterality: Left;  Left Braciocephalic Fistula using Vascu-Guard Patch   APPENDECTOMY  1980   [place] Left 2010   CRANIOTOMY Right 10/05/2020   Procedure: CRANIOTOMY HEMATOMA EVACUATION SUBDURAL;  Surgeon: [name], MD;  Location: [place];  Service: Neurosurgery;  Laterality: Right;   CRANIOTOMY Right 10/14/2020   Procedure: Redo Right CRANIOTOMY HEMATOMA EVACUATION SUBDURAL;  Surgeon: [name], MD;  Location: [place];  Service: Neurosurgery;  Laterality: Right;   INCISION AND DRAINAGE  12/2012   sebacebus cyst, infected   MANDIBLE FRACTURE SURGERY     [name] N/A 07/16/2011   Procedure: Earney Mallet;  Surgeon: Angelia Mould, MD;   Location: [facility] CATH LAB;  Service: Cardiovascular;  Laterality: N/A;    Assessment & Plan Clinical Impression: Patient is a 65 y.o. year old male with history of hypertension, prediabetes, gout, hyperlipidemia, chronic anemia, polysubstance/tobacco abuse, end-stage renal disease secondary to hypertension on hemodialysis for 5 years before receiving a DDRT(decreased donor renal transplantation) at [facility] 09/13/2016 followed by Dr. Moshe Cipro with [facility] and medical noncompliance.  Per chart review patient lives with his elderly mother who has early dementia as well as his brother.  Independent prior to admission.  Presented 10/05/2020 after being found down for unknown amount of time.  Admission chemistries hemoglobin 9.6, potassium 5.4, BUN 102, creatinine 17.95, CK 511, lactic acid 1.0, troponin 66, ammonia level 37, hemoglobin A1c 5.6.  Cranial CT scan showed subacute right-sided subdural hematoma with significant mass-effect and leftward midline shift measuring 1.7 cm.  CT cervical spine negative.  Underwent right craniotomy for evacuation of subdural hematoma 10/05/2020 per Dr. Sherley Bounds.  Maintained on Keppra for seizure prophylaxis.  Nephrology services consulted placed on hemodialysis for progressive decline in renal function.  Hospital course follow-up cranial CT scan showed increasing right to left midline shift with increased effacement of the lateral ventricles and underwent subdural drain placement for recurrent right chronic subdural hematoma 10/14/2020 per Dr. Sherley Bounds..  Maintain on a regular diet.  Hemodialysis continues to follow plan is for outpatient hemodialysis Monday Wednesday Fridays with latest creatinine improved to 7.16.  Acute on chronic anemia latest hemoglobin 7.1.  Therapy evaluations completed due to patient decreased functional mobility was admitted for a comprehensive rehab program.  Patient currently requires min with mobility secondary to muscle weakness, decreased  cardiorespiratoy endurance, decreased motor planning, decreased attention, decreased awareness, decreased problem solving, decreased safety awareness, and decreased memory, and decreased standing balance, decreased postural control, and decreased balance strategies.  Prior to hospitalization, patient was independent  with mobility and lived with Family in a House home.  Home access is 2Stairs to enter.  Patient will benefit from skilled PT intervention to maximize safe functional mobility, minimize fall risk, and decrease caregiver burden for planned discharge home with intermittent assist.  Anticipate patient will benefit from follow up [facility] at discharge.  PT - End of Session Activity Tolerance: Tolerates 30+ min activity with multiple rests Endurance Deficit: Yes Endurance Deficit Description: mild fatigue with activity PT Assessment Rehab Potential (ACUTE/IP ONLY): Good PT Barriers to Discharge: [place] home environment;Decreased caregiver support;Home environment access/layout;Wound Care;Hemodialysis;Behavior PT Barriers to Discharge Comments: decreased insight into deficits, poor safety awareness, home environment, dialysis, impulsive, family members unable to provide physical assist. PT Patient demonstrates impairments in the following area(s): Balance;Behavior;Endurance;Motor;Safety;Skin Integrity PT Transfers Functional Problem(s): Bed Mobility;Bed to Chair;Car;Furniture PT Locomotion Functional Problem(s): Ambulation;Wheelchair Mobility;Stairs PT Plan PT Intensity: Minimum of 1-2 x/day ,45 to 90 minutes PT Frequency: 5 out of 7 days PT Duration Estimated Length of Stay: 7-10 days PT Treatment/Interventions: Ambulation/gait training;Discharge planning;Functional mobility training;Psychosocial support;Therapeutic Activities;Visual/perceptual remediation/compensation;Balance/vestibular training;Disease management/prevention;Neuromuscular re-education;Skin care/wound management;Therapeutic  Exercise;Wheelchair propulsion/positioning;Cognitive remediation/compensation;DME/adaptive equipment instruction;Pain management;Splinting/orthotics;UE/LE Strength taining/ROM;Community reintegration;Patient/family education;Stair training;UE/LE Coordination activities PT Transfers Anticipated Outcome(s): Mod I PT Locomotion Anticipated Outcome(s): supervision PT Recommendation Follow Up Recommendations: Home health PT Patient destination: Home Equipment Recommended: To be determined Equipment Details: has none  PT Evaluation Precautions/Restrictions Precautions Precautions: Fall Restrictions Weight Bearing Restrictions: No Pain Interference Pain Interference Pain Effect on Sleep: 0. Does not apply - I have not had any pain or hurting in the past 5 days Pain Interference with Therapy Activities: 1. Rarely or not at all Pain Interference with Day-to-Day Activities: 1. Rarely or not at all Home Living/Prior [place] Available Help at Discharge: Available PRN/intermittently Type of Home: House Home Access: Stairs to enter Entrance Stairs-Number of Steps: 2 Entrance Stairs-Rails: Right Home Layout: Multi-level Alternate Level Stairs-Number of Steps: 1 flight Alternate Level Stairs-Rails: Right Bathroom Shower/Tub: Chiropodist: Handicapped height Bathroom Accessibility: Yes Additional Comments: pt is hyperverbose and requires frequent cues for redirection and focus  Lives With: Family Prior Function Level of Independence: Independent with gait;Independent with basic ADLs;Independent with homemaking with ambulation;Independent with transfers  Able to Take Stairs?: Yes Driving: Yes Vocation: Unemployed Vision/Perception  Vision - History Ability to See in Adequate Light: 0 Adequate Vision - Assessment Eye Alignment: Within Functional Limits Ocular Range of Motion: Within Functional Limits Alignment/Gaze Preference: Within Defined  Limits Tracking/Visual Pursuits: Able to track stimulus in all quads without difficulty Saccades: Within functional limits Perception Perception: Within Functional Limits Praxis Praxis: Intact  Cognition Overall Cognitive Status: Impaired/Different from baseline Arousal/Alertness: Awake/alert Orientation Level: Oriented X4 Memory: Impaired Awareness: Impaired Problem Solving: Impaired Behaviors: Impulsive Safety/Judgment: Impaired Comments: patient states "there is nothing wrong with me"  does recognize balance and visual impairments when challenged [employer] Scales of Cognitive Functioning: Confused/appropriate Sensation Sensation Light Touch: Appears Intact Proprioception: Appears Intact Coordination Gross Motor Movements are Fluid and Coordinated: No Fine Motor Movements are Fluid and Coordinated: No Coordination and Movement Description: uncoordinated due to weakness/deconditioning, decreased motor  planning/sequencing, and impaired problem solving skills Finger Nose Finger Test: mild dysmetria bilaterally Heel Shin Test: decreased ROM bilaterally Motor  Motor Motor: Abnormal postural alignment and control Motor - Skilled Clinical Observations: uncoordinated due to weakness/deconditioning, decreased motor planning/sequencing, decreased balance/postural control, and impaired problem solving skills  Trunk/Postural Assessment  Cervical Assessment Cervical Assessment: Within Functional Limits Thoracic Assessment Thoracic Assessment: Exceptions to [facility] (kyphosis) Lumbar Assessment Lumbar Assessment: Exceptions to [facility] (posterior pelvic tilt) Postural Control Postural Control: Deficits on evaluation (generalized unsteadiness)  Balance Balance Balance Assessed: Yes Static Sitting Balance Static Sitting - Balance Support: Feet supported;Bilateral upper extremity supported Static Sitting - Level of Assistance: 5: Stand by assistance Dynamic Sitting Balance Dynamic  Sitting - Balance Support: Feet supported;No upper extremity supported Dynamic Sitting - Level of Assistance: 5: Stand by assistance Static Standing Balance Static Standing - Balance Support: No upper extremity supported Static Standing - Level of Assistance: 5: Stand by assistance (CGA) Dynamic Standing Balance Dynamic Standing - Balance Support: Right upper extremity supported Dynamic Standing - Level of Assistance: 4: Min assist Dynamic Standing - Comments: with gait Extremity Assessment  RLE Assessment RLE Assessment: Exceptions to [facility] General Strength Comments: grossly generalized to 4-/5 LLE Assessment LLE Assessment: Exceptions to [facility] General Strength Comments: grossly generalized to 4-/5  Care Tool Care Tool Bed Mobility Roll left and right activity   Roll left and right assist level: Supervision/Verbal cueing    Sit to lying activity   Sit to lying assist level: Supervision/Verbal cueing    Lying to sitting on side of bed activity   Lying to sitting on side of bed assist level: the ability to move from lying on the back to sitting on the side of the bed with no back support.: Supervision/Verbal cueing     Care Tool Transfers Sit to stand transfer   Sit to stand assist level: Contact Guard/Touching assist    Chair/bed transfer   Chair/bed transfer assist level: Contact Guard/Touching assist     Psychologist, counselling transfer activity did not occur: Environmental limitations (out of order)        Care Tool Locomotion Ambulation   Assist level: Minimal Assistance - Patient > 75% Assistive device: No Device Max distance: 68ft  Walk 10 feet activity   Assist level: Minimal Assistance - Patient > 75% Assistive device: No Device   Walk 50 feet with 2 turns activity   Assist level: Minimal Assistance - Patient > 75% Assistive device: No Device  Walk 150 feet activity Walk 150 feet activity did not occur: Safety/medical concerns (fatigue,  weakness, decreased balance/postural control)      Walk 10 feet on uneven surfaces activity Walk 10 feet on uneven surfaces activity did not occur: Safety/medical concerns (fatigue, weakness, decreased balance/postural control)      Stairs   Assist level: Contact Guard/Touching assist Stairs assistive device: 2 hand rails Max number of stairs: 4  Walk up/down 1 step activity   Walk up/down 1 step (curb) assist level: Contact Guard/Touching assist Walk up/down 1 step or curb assistive device: 2 hand rails    Walk up/down 4 steps activity Walk up/down 4 steps assist level: Contact Guard/Touching assist Walk up/down 4 steps assistive device: 2 hand rails  Walk up/down 12 steps activity Walk up/down 12 steps activity did not occur: Safety/medical concerns (fatigue, weakness, decreased balance/postural control)      Pick up small objects from floor Pick up small object from the  floor (from standing position) activity did not occur: Safety/medical concerns (decreased balance/postural control)      Wheelchair Is the patient using a wheelchair?: Yes Type of Wheelchair: Manual   Wheelchair assist level: Supervision/Verbal cueing Max wheelchair distance: 131ft  Wheel 50 feet with 2 turns activity   Assist Level: Supervision/Verbal cueing  Wheel 150 feet activity   Assist Level: Supervision/Verbal cueing    Refer to Care Plan for Long Term Goals  SHORT TERM GOAL WEEK 1 PT Short Term Goal 1 (Week 1): STG=LTG due to LOS  Recommendations for other services: None   Skilled Therapeutic Intervention Evaluation completed (see details above and below) with education on PT POC and goals and individual treatment initiated with focus on functional mobility/transfers, generalized strengthening, dynamic standing balance/coordination, ambulation, stair navigation, and improved activity tolerance. Received pt supine in bed asleep. Upon wakening, pt educated on PT evaluation, CIR policies, and therapy  schedule and agreeable. Pt denied any pain during session but reported "tingling" in RLE. Pt hyperverbal and required cues for redirection and focus/attention throughout session. Pt slightly impulsive and with decreased insight into deficits stating "I told you, nothing is wrong with me" but was unable to tell therapist what happened and how he ended up in the hospital. Pt transferred supine<>sitting EOB with HOB slightly elevated and supervision and applied lotion to LEs in figure four position with set up assist. Pt transferred bed<>WC stand<>pivot without AD and CGA. Pt transported to ortho gym in [facility] dependently for time management purposes and attempted to perform simulated car transfer however car "out of order". Transported to main therapy gym and pt ambulated 25ft without AD and min handheld assist. Pt demonstrates decreased cadence, narrow BOS, decreased foot clearance, and generalized unsteadiness when turning. Pt then navigated 4 steps with 2 railings and CGA ascending and descending with a step through pattern. Pt then performed WC mobility 125ft using BUE and supervision back to room with cues to avoid running into obstacles on L side. Stand<>pivot WC<>bed without AD and CGA and sit<>supine with supervision. Concluded session with pt supine in bed, needs within reach, and bed alarm on. RN present at bedside and provided pt with coffee.   Mobility Bed Mobility Bed Mobility: Rolling Right;Rolling Left;Sit to Supine;Supine to Sit Rolling Right: Supervision/verbal cueing Rolling Left: Supervision/Verbal cueing Supine to Sit: Supervision/Verbal cueing Sit to Supine: Supervision/Verbal cueing Transfers Transfers: Sit to Stand;Stand to Sit;Stand Pivot Transfers Sit to Stand: Contact Guard/Touching assist Stand to Sit: Contact Guard/Touching assist Stand Pivot Transfers: Contact Guard/Touching assist Stand Pivot Transfer Details: Verbal cues for technique;Verbal cues for  precautions/safety Transfer (Assistive device): None Locomotion  Gait Ambulation: Yes Gait Assistance: Minimal Assistance - Patient > 75% Gait Distance (Feet): 80 Feet Assistive device: 1 person hand held assist Gait Assistance Details: Verbal cues for precautions/safety;Verbal cues for technique Gait Assistance Details: verbal cues for safety, turning technique, and attention Gait Gait: Yes Gait Pattern: Impaired Gait Pattern: Step-through pattern;Decreased stride length;Decreased trunk rotation;Decreased step length - right;Decreased step length - left;Poor foot clearance - left;Poor foot clearance - right;Narrow base of support Gait velocity: decreased Stairs / Additional Locomotion Stairs: Yes Stairs Assistance: Contact Guard/Touching assist Stair Management Technique: Two rails Number of Stairs: 4 Height of Stairs: 6 Wheelchair Mobility Wheelchair Mobility: Yes Wheelchair Assistance: Chartered loss adjuster: Both upper extremities Wheelchair Parts Management: Needs assistance Distance: 176ft   Discharge Criteria: Patient will be discharged from PT if patient refuses treatment 3 consecutive times without medical reason, if treatment goals  not met, if there is a change in medical status, if patient makes no progress towards goals or if patient is discharged from hospital.  The above assessment, treatment plan, treatment alternatives and goals were discussed and mutually agreed upon: by patient  Alfonse Alpers PT, DPT  10/18/2020, 3:52 PM

## 2020-10-18 NOTE — Evaluation (Signed)
Speech Language Pathology Assessment and Plan  Patient Details  Name: Christopher Chapman MRN: [id number] Date of Birth: 24-Apr-1955  SLP Diagnosis: Cognitive Impairments  Rehab Potential: Good ELOS: 7-10 days    Today's Date: 10/18/2020 SLP Individual Time: 78-1400 SLP Individual Time Calculation (min): 10 min   Hospital Problem: Principal Problem:   Subdural hematoma Active Problems:   Traumatic subdural hematoma  Past Medical History:  Past Medical History:  Diagnosis Date   Anemia    Arthritis    CRF (chronic renal failure)    ESRD (end stage renal disease) ([place])    Family history of anesthesia complication    [place] going to sleep.   Gout    Hx of cocaine abuse ([place])    Hyperlipidemia    Hypertension    Hyperuricemia    MRSA (methicillin resistant staph aureus) culture positive 07/2011   Parathyroid disease ([place])    Hx of , No meds at present   Tobacco abuse    Tuberculosis 1985   Treated with imeds for a years.   Past Surgical History:  Past Surgical History:  Procedure Laterality Date   ANGIOPLASTY  08/09/2011   Procedure: ANGIOPLASTY;  Surgeon: Angelia Mould, MD;  Location: [place] NEURO ORS;  Service: Vascular;  Laterality: Left;  Left Braciocephalic Fistula using Vascu-Guard Patch   APPENDECTOMY  1980   [place] Left 2010   CRANIOTOMY Right 10/05/2020   Procedure: CRANIOTOMY HEMATOMA EVACUATION SUBDURAL;  Surgeon: [name], MD;  Location: [place];  Service: Neurosurgery;  Laterality: Right;   CRANIOTOMY Right 10/14/2020   Procedure: Redo Right CRANIOTOMY HEMATOMA EVACUATION SUBDURAL;  Surgeon: [name], MD;  Location: [place];  Service: Neurosurgery;  Laterality: Right;   INCISION AND DRAINAGE  12/2012   sebacebus cyst, infected   MANDIBLE FRACTURE SURGERY     [name] N/A 07/16/2011   Procedure: Earney Mallet;  Surgeon: Angelia Mould, MD;  Location: [facility] CATH LAB;  Service: Cardiovascular;  Laterality: N/A;    Assessment /  Plan / Recommendation Clinical Impression   Patient is a 65 y.o. year old male with history of hypertension, prediabetes, gout, hyperlipidemia, chronic anemia, polysubstance/tobacco abuse, end-stage renal disease secondary to hypertension on hemodialysis for 5 years before receiving a DDRT(decreased donor renal transplantation) at [facility] 09/13/2016 followed by Dr. Moshe Cipro with CKA and medical noncompliance.  Per chart review patient lives with his elderly mother who has early dementia as well as his brother.  Independent prior to admission.  Presented 10/05/2020 after being found down for unknown amount of time.  Admission chemistries hemoglobin 9.6, potassium 5.4, BUN 102, creatinine 17.95, CK 511, lactic acid 1.0, troponin 66, ammonia level 37, hemoglobin A1c 5.6.  Cranial CT scan showed subacute right-sided subdural hematoma with significant mass-effect and leftward midline shift measuring 1.7 cm.  CT cervical spine negative.  Underwent right craniotomy for evacuation of subdural hematoma 10/05/2020 per Dr. Sherley Bounds.  Maintained on Keppra for seizure prophylaxis.  Nephrology services consulted placed on hemodialysis for progressive decline in renal function.  Hospital course follow-up cranial CT scan showed increasing right to left midline shift with increased effacement of the lateral ventricles and underwent subdural drain placement for recurrent right chronic subdural hematoma 10/14/2020 per Dr. Sherley Bounds..  Maintain on a regular diet.  Hemodialysis continues to follow plan is for outpatient hemodialysis Monday Wednesday Fridays with latest creatinine improved to 7.16.  Acute on chronic anemia latest hemoglobin 7.1. Patient transferred to CIR on 10/17/2020 .  Pt present at [facility] VI emerging VII level, with moderate impairments in sustained attention, short term recall, basic problem solving, intellectual/emergent awareness and pragmatic skills. Pt scored 15 out 30 (n=>27) on SLUMS indicating  severe deficits primary impacted by reduce awareness of deficits, reduced sustained attention and reduced short term recall. Pt was orientated x4, recalled 2/5 words with delay, demonstrated simple mental math and was able to correct errors in clock drawing task with cues. Pt demonstrated intermittent intellectual awareness and ability to correct errors in basic problem-solving tasks (ALFA money management) with cues. Pt's pragmatic skills are impaired in topic maintenance and abrupt turning taking. Pt supports no acute changes in speech nor swallowing and per chart review by SLP there are no concerns in these areas. Pt would benefit from skilled ST services in order to maximize functional independence and reduce burden of care, requiring 24 hour supervision at discharge with continued skilled ST services.   Skilled Therapeutic Interventions          Skilled ST services focused on cognitive skills. SLP facilitated administration of cognitive linguistic formal assessment and provided education of results. SLP and pt collaborated to set goals for cognitive linguistic needs during length of stay. All questions answered to satisfaction.  SLP also facilitated ALFA money management task, pt demonstrated appropriate adding and subtracting with mod A fade to min A verbal cues for written aids to recall numbers, min A verbal cues for appropriate problem solving and ability to correct errors with max A verbal cues. Pt was left in room with call bell within reach and bed alarm set. SLP recommends to continue skilled services.   SLP Assessment  Patient will need skilled [place] [facility] Services during CIR admission    Recommendations  Patient destination: Home Follow up Recommendations: Home Health SLP;24 hour supervision/assistance Equipment Recommended: None recommended by SLP    SLP Frequency 3 to 5 out of 7 days   SLP Duration  SLP Intensity  SLP Treatment/Interventions 7-10 days  Minumum of  1-2 x/day, 30 to 90 minutes  Cognitive remediation/compensation;Cueing hierarchy;Functional tasks;Environmental controls;Internal/external aids;Patient/family education    Pain Pain Assessment Pain Score: 0-No pain  Prior Functioning Cognitive/Linguistic Baseline: Information not available Type of Home: House  Lives With: Family Available Help at Discharge: Available PRN/intermittently Vocation: Unemployed  SLP Evaluation Cognition Overall Cognitive Status: Impaired/Different from baseline Arousal/Alertness: Awake/alert Orientation Level: Oriented X4 Year: 2022 Month: October Day of Week: Correct Attention: Sustained Sustained Attention: Appears intact Sustained Attention Impairment: Functional complex;Verbal complex Memory: Impaired Memory Impairment: Decreased recall of new information Immediate Memory Recall: Sock;Blue;Bed Memory Recall Sock: Without Cue Memory Recall Blue: Without Cue Memory Recall Bed: Not able to recall Awareness: Impaired Awareness Impairment: Intellectual impairment;Emergent impairment Problem Solving: Impaired Problem Solving Impairment: Functional basic;Verbal basic Behaviors: Impulsive Safety/Judgment: Impaired Comments: patient states "there is nothing wrong with me"  does recognize balance and visual impairments when challenged [employer] Scales of Cognitive Functioning: Confused/appropriate  Comprehension Auditory Comprehension Overall Auditory Comprehension: Appears within functional limits for tasks assessed Yes/No Questions: Within Functional Limits Reading Comprehension Reading Status: Not tested Expression Expression Primary Mode of Expression: Verbal Verbal Expression Overall Verbal Expression: Impaired Initiation: No impairment Level of Generative/Spontaneous Verbalization: Conversation Naming: Impairment Divergent: 75-100% accurate (11 animals in 1 minute) Pragmatics: Impairment Written Expression Dominant Hand:  Right Oral Motor Oral Motor/Sensory Function Overall Oral Motor/Sensory Function: Within functional limits Motor Speech Overall Motor Speech: Appears within functional limits for tasks assessed  Care Tool Care Tool Cognition  Ability to hear (with hearing aid or hearing appliances if normally used Ability to hear (with hearing aid or hearing appliances if normally used): 0. Adequate - no difficulty in normal conservation, social interaction, listening to TV   Expression of Ideas and Wants Expression of Ideas and Wants: 3. Some difficulty - exhibits some difficulty with expressing needs and ideas (e.g, some words or finishing thoughts) or speech is not clear   Understanding Verbal and Non-Verbal Content Understanding Verbal and Non-Verbal Content: 3. Usually understands - understands most conversations, but misses some part/intent of message. Requires cues at times to understand  Memory/Recall Ability Memory/Recall Ability : Current season;Staff names and faces;That he or she is in a hospital/hospital unit   Short Term Goals: Week 1: SLP Short Term Goal 1 (Week 1): STG=LTG due to short ELOS (7-10 days)  Refer to Care Plan for Long Term Goals  Recommendations for other services: None   Discharge Criteria: Patient will be discharged from SLP if patient refuses treatment 3 consecutive times without medical reason, if treatment goals not met, if there is a change in medical status, if patient makes no progress towards goals or if patient is discharged from hospital.  The above assessment, treatment plan, treatment alternatives and goals were discussed and mutually agreed upon: by patient  Paulette [place]  [facility] 10/18/2020, 2:32 PM

## 2020-10-18 NOTE — Evaluation (Signed)
Occupational Therapy Assessment and Plan  Patient Details  Name: Christopher Chapman MRN: [id number] Date of Birth: [birth year]  OT Diagnosis: cognitive deficits, disturbance of vision, and muscle weakness (generalized) Rehab Potential:   ELOS: 7-10 days   Today's Date: 10/18/2020 OT Individual Time: 0756-0900 OT Individual Time Calculation (min): 64 min     Hospital Problem: Principal Problem:   Subdural hematoma Active Problems:   Traumatic subdural hematoma   Past Medical History:  Past Medical History:  Diagnosis Date   Anemia    Arthritis    CRF (chronic renal failure)    ESRD (end stage renal disease) ([place])    Family history of anesthesia complication    [place] going to sleep.   Gout    Hx of cocaine abuse ([place])    Hyperlipidemia    Hypertension    Hyperuricemia    MRSA (methicillin resistant staph aureus) culture positive 07/2011   Parathyroid disease ([place])    Hx of , No meds at present   Tobacco abuse    Tuberculosis 1985   Treated with imeds for a years.   Past Surgical History:  Past Surgical History:  Procedure Laterality Date   ANGIOPLASTY  08/09/2011   Procedure: ANGIOPLASTY;  Surgeon: Angelia Mould, MD;  Location: [place] NEURO ORS;  Service: Vascular;  Laterality: Left;  Left Braciocephalic Fistula using Vascu-Guard Patch   APPENDECTOMY  1980   [place] Left 2010   CRANIOTOMY Right 10/05/2020   Procedure: CRANIOTOMY HEMATOMA EVACUATION SUBDURAL;  Surgeon: [name], MD;  Location: [place];  Service: Neurosurgery;  Laterality: Right;   CRANIOTOMY Right 10/14/2020   Procedure: Redo Right CRANIOTOMY HEMATOMA EVACUATION SUBDURAL;  Surgeon: [name], MD;  Location: [place];  Service: Neurosurgery;  Laterality: Right;   INCISION AND DRAINAGE  12/2012   sebacebus cyst, infected   MANDIBLE FRACTURE SURGERY     [name] N/A 07/16/2011   Procedure: Earney Mallet;  Surgeon: Angelia Mould, MD;  [facility]: [facility] CATH LAB;  Service:  Cardiovascular;  Laterality: N/A;    Assessment & Plan Clinical Impression: Patient is a 65 y.o. year old male with history of hypertension, prediabetes, gout, hyperlipidemia, chronic anemia, polysubstance/tobacco abuse, end-stage renal disease secondary to hypertension on hemodialysis for 5 years before receiving a DDRT(decreased donor renal transplantation) at [facility] 09/13/2016 followed by Dr. Moshe Cipro with CKA and medical noncompliance.  Per chart review patient lives with his elderly mother who has early dementia as well as his brother.  Independent prior to admission.  Presented 10/05/2020 after being found down for unknown amount of time.  Admission chemistries hemoglobin 9.6, potassium 5.4, BUN 102, creatinine 17.95, CK 511, lactic acid 1.0, troponin 66, ammonia level 37, hemoglobin A1c 5.6.  Cranial CT scan showed subacute right-sided subdural hematoma with significant mass-effect and leftward midline shift measuring 1.7 cm.  CT cervical spine negative.  Underwent right craniotomy for evacuation of subdural hematoma 10/05/2020 per Dr. Sherley Bounds.  Maintained on Keppra for seizure prophylaxis.  Nephrology services consulted placed on hemodialysis for progressive decline in renal function.  Hospital course follow-up cranial CT scan showed increasing right to left midline shift with increased effacement of the lateral ventricles and underwent subdural drain placement for recurrent right chronic subdural hematoma 10/14/2020 per Dr. Sherley Bounds..  Maintain on a regular diet.  Hemodialysis continues to follow plan is for outpatient hemodialysis Monday Wednesday Fridays with latest creatinine improved to 7.16.  Acute on chronic anemia latest hemoglobin 7.1. Patient transferred to  CIR on 10/17/2020 .    Patient currently requires min with basic self-care skills and IADL secondary to decreased cardiorespiratoy endurance, decreased coordination, field cut, decreased attention, decreased awareness, decreased  problem solving, decreased safety awareness, and decreased memory, and decreased standing balance and decreased balance strategies.  Prior to hospitalization, patient could complete ADL with independent .  Patient will benefit from skilled intervention to decrease level of assist with basic self-care skills, increase independence with basic self-care skills, and increase level of independence with iADL prior to discharge home with care partner.  Anticipate patient will require intermittent supervision and follow up home health.  OT - End of Session Activity Tolerance: Tolerates 30+ min activity without fatigue Endurance Deficit: Yes Endurance Deficit Description: mild fatigue with adl OT Assessment OT Patient demonstrates impairments in the following area(s): Balance;Behavior;Cognition;Safety;Vision OT Basic ADL's Functional Problem(s): Grooming;Bathing;Dressing;Toileting OT Advanced ADL's Functional Problem(s): Simple Meal Preparation OT Transfers Functional Problem(s): Toilet;Tub/Shower OT Plan OT Intensity: Minimum of 1-2 x/day, 45 to 90 minutes OT Frequency: 5 out of 7 days OT Duration/Estimated Length of Stay: 7-10 days OT Treatment/Interventions: Balance/vestibular training;DME/adaptive equipment instruction;Patient/family education;Therapeutic Activities;Cognitive remediation/compensation;Functional mobility training;Self Care/advanced ADL retraining;Discharge planning;UE/LE Coordination activities;Visual/perceptual remediation/compensation OT Self Feeding Anticipated Outcome(s): independent OT Basic Self-Care Anticipated Outcome(s): set up/mod I OT Toileting Anticipated Outcome(s): mod I OT Bathroom Transfers Anticipated Outcome(s): CS/mod I OT Recommendation Patient destination: Home Follow Up Recommendations: Home health OT Equipment Recommended: To be determined   OT Evaluation Precautions/Restrictions  Precautions Precautions: Fall Restrictions Weight Bearing  Restrictions: No   Pain Pain Assessment Pain Scale: 0-10 Pain Score: 0-No pain Home Living/Prior Functioning Home Living Family/patient expects to be discharged to:: Private residence Living Arrangements: Parent Available Help at Discharge: Available PRN/intermittently Type of Home: House Home Access: Stairs to enter Technical brewer of Steps: 2 Entrance Stairs-Rails: Right Home Layout: Multi-level Alternate Level Stairs-Number of Steps: 1 flight Alternate Level Stairs-Rails: Right Bathroom Shower/Tub: Chiropodist: Handicapped height Bathroom Accessibility: Yes Additional Comments: pt is hyperverbose and requires frequent cues for redirection and focus  Lives With: Family Prior Function Level of Independence: Independent with gait, Independent with basic ADLs, Independent with homemaking with ambulation, Independent with transfers  Able to Take Stairs?: Yes Driving: Yes Vocation: Unemployed Comments: Pt denies h/o falls.  he does not use AD for ambulation.  He reports he drives Vision Baseline Vision/History: 1 Wears glasses Ability to See in Adequate Light: 0 Adequate Patient Visual Report: Peripheral vision impairment Vision Assessment?: Yes Eye Alignment: Within Functional Limits Ocular Range of Motion: Within Functional Limits Alignment/Gaze Preference: Within Defined Limits Tracking/Visual Pursuits: Able to track stimulus in all quads without difficulty Saccades: Within functional limits Visual Fields: Left visual field deficit Perception  Perception: Within Functional Limits Praxis Praxis: Intact Cognition Overall Cognitive Status: Impaired/Different from baseline Arousal/Alertness: Awake/alert Orientation Level: Person;Place;Situation Person: Oriented Place: Oriented Situation: Oriented Year: 2022 Month: October Day of Week: Correct Memory: Impaired Memory Impairment: Decreased recall of new information Immediate Memory Recall:  Sock;Blue;Bed Memory Recall Sock: Without Cue Memory Recall Blue: Without Cue Memory Recall Bed: Not able to recall Attention: Sustained Sustained Attention: Impaired Sustained Attention Impairment: Functional complex Awareness: Appears intact Problem Solving: Impaired Problem Solving Impairment: Functional basic Behaviors: Impulsive Safety/Judgment: Impaired Comments: patient states "there is nothing wrong with me"  does recognize balance and visual impairments when challenged Sensation Sensation Light Touch: Appears Intact Hot/Cold: Appears Intact Proprioception: Appears Intact Coordination Fine Motor Movements are Fluid and Coordinated: No Finger Nose Finger Test: mild  dysmetria bilaterally Heel Shin Test: decreased ROM bilaterally Motor  Motor Motor - Skilled Clinical Observations: generalized weakness and mild balance impairment  Trunk/Postural Assessment  Postural Control Postural Control: Within Functional Limits  Balance Balance Balance Assessed: Yes Static Sitting Balance Static Sitting - Balance Support: Feet supported Static Sitting - Level of Assistance: 5: Stand by assistance Dynamic Sitting Balance Dynamic Sitting - Balance Support: During functional activity Dynamic Sitting - Level of Assistance: 5: Stand by assistance Static Standing Balance Static Standing - Balance Support: During functional activity Static Standing - Level of Assistance: 4: Min assist Dynamic Standing Balance Dynamic Standing - Balance Support: During functional activity Dynamic Standing - Level of Assistance: 4: Min assist Extremity/Trunk Assessment RUE Assessment RUE Assessment: Within Functional Limits LUE Assessment Active Range of Motion (AROM) Comments: 3/4 - full  Care Tool Care Tool Self Care Eating   Eating Assist Level: Set up assist    Oral Care    Oral Care Assist Level: Set up assist    Bathing   Body parts bathed by patient: Right arm;Left  arm;Chest;Abdomen;Front perineal area;Buttocks;Right upper leg;Left upper leg;Right lower leg;Left lower leg;Face Body parts bathed by helper: Right lower leg;Left lower leg   Assist Level: Minimal Assistance - Patient > 75%    Upper Body Dressing(including orthotics)   What is the patient wearing?: Pull over shirt   Assist Level: Minimal Assistance - Patient > 75%    Lower Body Dressing (excluding footwear)   What is the patient wearing?: Pants Assist for lower body dressing: Minimal Assistance - Patient > 75%    Putting on/Taking off footwear   What is the patient wearing?: Non-skid slipper socks Assist for footwear: Minimal Assistance - Patient > 75%       Care Tool Toileting Toileting activity   Assist for toileting: Contact Guard/Touching assist     Care Tool Bed Mobility Roll left and right activity   Roll left and right assist level: Supervision/Verbal cueing    Sit to lying activity   Sit to lying assist level: Contact Guard/Touching assist    Lying to sitting on side of bed activity   Lying to sitting on side of bed assist level: the ability to move from lying on the back to sitting on the side of the bed with no back support.: Contact Guard/Touching assist     Care Tool Transfers Sit to stand transfer   Sit to stand assist level: Contact Guard/Touching assist    Chair/bed transfer   Chair/bed transfer assist level: Minimal Assistance - Patient > 75%     Toilet transfer   Assist Level: Minimal Assistance - Patient > 75%     Care Tool Cognition  Expression of Ideas and Wants Expression of Ideas and Wants: 4. Without difficulty (complex and basic) - expresses complex messages without difficulty and with speech that is clear and easy to understand  Understanding Verbal and Non-Verbal Content Understanding Verbal and Non-Verbal Content: 3. Usually understands - understands most conversations, but misses some part/intent of message. Requires cues at times to  understand   Memory/Recall Ability Memory/Recall Ability : Current season;Staff names and faces;That he or she is in a hospital/hospital unit   Refer to Care Plan for [place] 1 OT Short Term Goal 1 (Week 1): STG = LTG due to estimated LOS  Recommendations for other services: None    Skilled Therapeutic Intervention  Patient in bed, alert, denies pain.  Evaluation completed as documented  above.  Patient happy to participate in adl and mobility training.  Functional transfers/ambulation with RW CG/min A to stedy.  Toileting and lower body dressing CG/min A with cues for safe technique and reaching.  He tends to move quickly and underestimate his balance impairments.  Upper body bathing and dressing with set up/CS.   Grooming tasks with set up, min A to open containers.  Awareness of left side not an issue with basic self care but field impaired and would benefit from dynamic standing activities to challenge.  He was pleasant and cooperative t/o session.  He returned to bed at close of session, telesitter in place, bed alarm set and call bell in hand.     ADL ADL Eating: Set up Where Assessed-Eating: Bed level Grooming: Setup Where Assessed-Grooming: Sitting at sink;Wheelchair Upper Body Bathing: Setup Where Assessed-Upper Body Bathing: Sitting at sink Lower Body Bathing: Minimal assistance Where Assessed-Lower Body Bathing: Sitting at sink Upper Body Dressing: Setup;Minimal cueing Where Assessed-Upper Body Dressing: Wheelchair Lower Body Dressing: Minimal assistance Where Assessed-Lower Body Dressing: Sitting at sink;Wheelchair Toileting: Therapist, music: Psychiatric nurse Method: Counselling psychologist: Raised toilet seat;Grab bars Mobility  Bed Mobility Bed Mobility: Supine to Sit;Sit to Supine Supine to Sit: Contact Guard/Touching assist Sit to Supine: Contact Guard/Touching assist Transfers Sit to  Stand: Contact Guard/Touching assist Stand to Sit: Contact Guard/Touching assist   Discharge Criteria: Patient will be discharged from OT if patient refuses treatment 3 consecutive times without medical reason, if treatment goals not met, if there is a change in medical status, if patient makes no progress towards goals or if patient is discharged from hospital.  The above assessment, treatment plan, treatment alternatives and goals were discussed and mutually agreed upon: by patient  Carlos Levering 10/18/2020, 11:38 AM

## 2020-10-18 NOTE — Progress Notes (Signed)
Patient ID: Christopher Chapman, male   DOB: 04/24/1955, 65 y.o.   MRN: [id number]  SW met with pt in room to complete assessment, provide updates from team conference, and ELOS 7-10 days. Pt aware SW to follow-up with his mother.   [place] spoke with pt mother Christopher Chapman [phone]) to provide updates. SW informed will f/u once aware on when pt will d/c to home.   SW spoke with renal navigator Melven Sartorius to inform on will f/u once there is a confirmed d/c date. SW will arrange transportation for dialysis.   Loralee Pacas, MSW, [place] Office: [phone] Cell: [phone] Fax: [phone]

## 2020-10-18 NOTE — Progress Notes (Signed)
PROGRESS NOTE   Subjective/Complaints: Pt reports a reasonable night. Denies pain. Up at sink with OT and the first time he's seen his staples. Doesn't remember accident or exactly why he's here  ROS: Patient denies fever, rash, sore throat, blurred vision, nausea, vomiting, diarrhea, cough, shortness of breath or chest pain, joint or back pain, headache, or mood change.    Objective:   No results found. Recent Labs    10/16/20 0625 10/17/20 0221  WBC 8.4 8.4  HGB 7.4* 7.1*  HCT 23.4* 24.0*  PLT 197 197   Recent Labs    10/16/20 0625 10/17/20 0221  NA 134* 133*  K 4.1 3.9  CL 98 97*  CO2 23 27  GLUCOSE 100* 102*  BUN 63* 37*  CREATININE 9.84* 7.16*  CALCIUM 6.7* 7.3*    Intake/Output Summary (Last 24 hours) at 10/18/2020 0917 Last data filed at 10/18/2020 0752 Gross per 24 hour  Intake 360 ml  Output 1375 ml  Net -1015 ml        Physical Exam: Vital Signs Blood pressure 136/77, pulse 84, temperature 98.4 F (36.9 C), resp. rate 16, height 6\' 1"  (1.854 m), weight 99.9 kg, SpO2 100 %.  General: Alert and oriented x 3, No apparent distress HEENT: Head is normocephalic, atraumatic, PERRLA, EOMI, sclera anicteric, oral mucosa pink and moist, dentition intact, ext ear canals clear,  Neck: Supple without JVD or lymphadenopathy Heart: Reg rate and rhythm. No murmurs rubs or gallops Chest: CTA bilaterally without wheezes, rales, or rhonchi; no distress Abdomen: Soft, non-tender, non-distended, bowel sounds positive. Extremities: No clubbing, cyanosis, or edema. Pulses are 2+ Psych: Pt's affect is appropriate. Pt is cooperative Skin: crani site CDI right scalp with staples Neuro:  Pt is alert, oriented to self and hospital. Fair insight and awareness. ?mild confabulation. Was aware of some current events. Moves all 4 limbs. No focal sensory or cerebellar findings. Normal tone.  Musculoskeletal: Full ROM, No  pain with AROM or PROM in the neck, trunk, or extremities. Posture appropriate     Assessment/Plan: 1. Functional deficits which require 3+ hours per day of interdisciplinary therapy in a comprehensive inpatient rehab setting. Physiatrist is providing close team supervision and 24 hour management of active medical problems listed below. Physiatrist and rehab team continue to assess barriers to discharge/monitor patient progress toward functional and medical goals  Care Tool:  Bathing              Bathing assist       Upper Body Dressing/Undressing Upper body dressing   What is the patient wearing?: Hospital gown only    Upper body assist Assist Level: Maximal Assistance - Patient 25 - 49%    Lower Body Dressing/Undressing Lower body dressing            Lower body assist       Toileting Toileting    Toileting assist Assist for toileting: Maximal Assistance - Patient 25 - 49%     Transfers Chair/bed transfer  Transfers assist     Chair/bed transfer assist level: Moderate Assistance - Patient 50 - 74%     Locomotion Ambulation   Ambulation assist  Walk 10 feet activity   Assist           Walk 50 feet activity   Assist           Walk 150 feet activity   Assist           Walk 10 feet on uneven surface  activity   Assist           Wheelchair     Assist               Wheelchair 50 feet with 2 turns activity    Assist            Wheelchair 150 feet activity     Assist          Blood pressure 136/77, pulse 84, temperature 98.4 F (36.9 C), resp. rate 16, height 6\' 1"  (1.854 m), weight 99.9 kg, SpO2 100 %.  Medical Problem List and Plan: 1.  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  Drains discontinued  10/16/2020             -patient may not shower             -ELOS/Goals: 7-10d  -Patient is beginning CIR therapies today including PT, OT, and SLP   -discussed PTA with pt briefly today 2.  Antithrombotics: -DVT/anticoagulation: SCDs.  Check vascular study             -antiplatelet therapy: N/A 3. Pain Management: Hydrocodone as needed 4. Mood: Provide emotional support             -antipsychotic agents: N/A 5. Neuropsych: This patient is capable of making decisions on his own behalf. 6. Skin/Wound Care: Routine skin checks 7. Fluids/Electrolytes/Nutrition: encourage PO -labs with HD, stable yesterday 8.  Seizure prophylaxis.  Keppra 250 mg twice daily-- no seizure activity should be able to stop prior to d/c from CIR 9.  End-stage renal disease with decreased donor renal transplantatiion 09/13/2016.  Continue hemodialysis as directed  -HD after therapies to maximize participation during the day 10.  Acute on chronic anemia.  Continue Aranesp  -hgb 7.1 10/10 11.  Hypertension.  Norvasc 5 mg daily, labetalol 300 mg 3 times daily, Cozaar 25 mg daily.     -10/11 bp controlled at present 12.  Prediabetes.  Hemoglobin A1c 5.6.  SSI 13.  History of polysubstance abuse.  Counseling, states he has being using ETOH and tobacco but not other substances    LOS: 1 days A FACE TO FACE EVALUATION WAS PERFORMED  Meredith Staggers 10/18/2020, 9:17 AM

## 2020-10-18 NOTE — Progress Notes (Signed)
Lower extremity venous has been completed.   Preliminary results in CV Proc.   Christopher Chapman 10/18/2020 2:38 PM

## 2020-10-18 NOTE — Progress Notes (Signed)
Pt returned form dialysis, vital signs stable, CBG checked. Pt reports no pain. No visible distress. Call bell within reach, no further needs at this time.

## 2020-10-18 NOTE — Progress Notes (Signed)
Patient ID: Christopher Chapman, male   DOB: March 30, 1955, 65 y.o.   MRN: [id number]  [facility] Progress Note   Assessment/ Plan:   1.  Chronic subdural hematoma: Status post evacuation originally on 9/28 with repeat evacuation surgery performed 10/7.  Now admitted to the inpatient rehabilitation unit for continued intensive PT/OT for management of functional deficits. 2. 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  He underwent hemodialysis yesterday to get him onto a MWF schedule which he will be following upon discharge ([place] 1st shift). 3. Anemia: Low hemoglobin and hematocrit without overt blood loss status post SDH evacuation.  Continue ESA. 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 5. Nutrition: Continue renal diet with oral nutritional supplements and renal multivitamin. 6. Hypertension: Blood pressure under decent control, will continue to monitor with hemodialysis and ongoing antihypertensive therapy.  Subjective:   Denies any complaints overnight and happy to be in the inpatient rehabilitation unit making progress.   Objective:   BP 136/77 (BP Location: Right Arm)   Pulse 84   Temp 98.4 F (36.9 C)   Resp 16   Ht 6\' 1"  (1.854 m)   Wt 99.9 kg   SpO2 100%   BMI 29.06 kg/m   Physical Exam: Gen: Comfortably sleeping in bed.  Craniotomy incision wounds healing over right side of head CVS: Pulse regular rhythm, normal rate, S1 and S2 normal Resp: Clear to auscultation bilaterally, no rales/rhonchi Abd: Soft, obese, nontender, bowel sounds normal Ext: Trace lower extremity edema with left upper arm brachiocephalic fistula  Labs: BMET Recent Labs  Lab 10/12/20 0236 10/13/20 0309 10/14/20 0307 10/15/20 0154 10/16/20 0625 10/17/20 0221  NA 131* 134* 133* 131* 134* 133*  K 3.5 3.7 3.9 4.4 4.1 3.9  CL  97* 98 98 98 98 97*  CO2 25 25 25 22 23 27   GLUCOSE 117* 116* 108* 156* 100* 102*  BUN 53* 36* 48* 56* 63* 37*  CREATININE 7.58* 6.25* 7.69* 8.63* 9.84* 7.16*  CALCIUM 6.3* 7.4* 6.9* 6.9* 6.7* 7.3*  PHOS  --   --   --   --   --  4.3   CBC Recent Labs  Lab 10/14/20 0307 10/15/20 0154 10/16/20 0625 10/17/20 0221  WBC 10.3 11.3* 8.4 8.4  HGB 7.6* 7.9* 7.4* 7.1*  HCT 24.5* 25.3* 23.4* 24.0*  MCV 89.1 88.2 88.6 91.3  PLT 174 220 197 197      Medications:     allopurinol  200 mg Oral Daily   amLODipine  5 mg Oral Daily   atorvastatin  40 mg Oral Daily   [START ON 10/19/2020] calcitRIOL  0.5 mcg Oral Q M,W,F-HD   darbepoetin (ARANESP) injection - DIALYSIS  60 mcg Intravenous Q Mon-HD   insulin aspart  0-15 Units Subcutaneous TID WC   labetalol  300 mg Oral TID   levETIRAcetam  250 mg Oral BID   losartan  25 mg Oral Daily   mycophenolate  360 mg Oral BID   pantoprazole  40 mg Oral QHS   senna  1 tablet Oral BID   sevelamer carbonate  800 mg Oral TID WC   tacrolimus  2 mg Oral BID     Elmarie Shiley, MD 10/18/2020, 12:08 PM

## 2020-10-18 NOTE — Patient Care Conference (Signed)
Inpatient RehabilitationTeam Conference and Plan of Care Update Date: 10/18/2020   Time: 10:16 AM    Patient Name: Christopher Chapman      Medical Record Number: [id number]  Date of Birth: 1955-04-27 Sex: Male         Room/Bed: 4M08C/4M08C-01 Payor Info: Payor: MEDICAID [place] / Plan: MEDICAID OF [place] / Product Type: *No Product type* /    Admit Date/Time:  10/17/2020  5:39 PM  Primary Diagnosis:  Subdural hematoma  Hospital Problems: Principal Problem:   Subdural hematoma Active Problems:   Traumatic subdural hematoma    Expected Discharge Date: Expected Discharge Date:  (7-10 days)  Team Members Present: Physician leading conference: Dr. Alger Simons Social Worker Present: Loralee Pacas, [place] Nurse Present: Dorthula Nettles, RN PT Present: Page Spiro, PT OT Present: Elisabeth Most, OT SLP Present: Weston Anna, SLP PPS Coordinator present : Gunnar Fusi, SLP     Current Status/Progress Goal Weekly Team Focus  Bowel/Bladder   Continent of b/b. LBM 10/10  Maintain continence  Toilet prn   Swallow/Nutrition/ Hydration             ADL's   Min A adl, CGA transfers with RW, mildly impulsive  mod I/CS  adl training, balance/transfer ttraining, family/patient education, basic HM, safety   Mobility   eval pending         Communication   eval pending         Safety/Cognition/ Behavioral Observations  eval pending         Pain   No c/o pain. PRNs available if needed.  Remain pain free.  Assess Qshift and PRN   Skin   Crani with staples on right side.  Promote healing and prevention of infection.  Assess Qshift and PRN     Discharge Planning:  To be assessed.   Team Discussion: Polysubstance abuse. Reasonable in site and awareness. Continent B/B. No pain reported. Right crani with staples. HD MWF. Recalled 2/3 words, missing left hand side. Balance deficits, possible visual field cut. Can be implulsive. Patient on target to meet rehab goals: Therapy evals  pending.  *See Care Plan and progress notes for long and short-term goals.   Revisions to Treatment Plan:  Behavioral management, adjusting medications.  Teaching Needs: Family education, medication management, pain management, skin/wound care, safety awareness, transfer training, gait training, balance training, endurance training.  Current Barriers to Discharge: Decreased caregiver support, Medical stability, Home enviroment access/layout, Wound care, Lack of/limited family support, Weight, Hemodialysis, Medication compliance, and Behavior  Possible Resolutions to Barriers: Family education with mother and brother. Continue current medications, teach skin/wound care to mother and brother, assess behavior daily.     Medical Summary Current Status: right SDH after ?fall. cognitively demonstrating improvement. pain well controlled. on HD for ESRD. bp controlled  Barriers to Discharge: Medical stability   Possible Resolutions to [employer] Focus: daily assessment of labs and pt data. behavioral mgt   Continued Need for Acute Rehabilitation Level of Care: The patient requires daily medical management by a physician with specialized training in physical medicine and rehabilitation for the following reasons: Direction of a multidisciplinary physical rehabilitation program to maximize functional independence : Yes Medical management of patient stability for increased activity during participation in an intensive rehabilitation regime.: Yes Analysis of laboratory values and/or radiology reports with any subsequent need for medication adjustment and/or medical intervention. : Yes   I attest that I was present, lead the team conference, and concur with the assessment and plan of  the team.   Cristi Loron 10/18/2020, 3:35 PM

## 2020-10-18 NOTE — Progress Notes (Signed)
Inpatient Rehabilitation Care Coordinator Assessment and Plan Patient Details  Name: Christopher Chapman MRN: [id number] Date of Birth: [birth year]  Today's Date: 10/18/2020  Hospital Problems: Principal Problem:   Subdural hematoma Active Problems:   Traumatic subdural hematoma  Past Medical History:  Past Medical History:  Diagnosis Date   Anemia    Arthritis    CRF (chronic renal failure)    ESRD (end stage renal disease) ([place])    Family history of anesthesia complication    [place] going to sleep.   Gout    Hx of cocaine abuse ([place])    Hyperlipidemia    Hypertension    Hyperuricemia    MRSA (methicillin resistant staph aureus) culture positive 07/2011   Parathyroid disease ([place])    Hx of , No meds at present   Tobacco abuse    Tuberculosis 1985   Treated with imeds for a years.   Past Surgical History:  Past Surgical History:  Procedure Laterality Date   ANGIOPLASTY  08/09/2011   Procedure: ANGIOPLASTY;  Surgeon: Angelia Mould, MD;  Location: [facility];  Service: Vascular;  Laterality: Left;  Left Braciocephalic Fistula using Vascu-Guard Patch   APPENDECTOMY  1980   [place] Left 2010   CRANIOTOMY Right 10/05/2020   Procedure: CRANIOTOMY HEMATOMA EVACUATION SUBDURAL;  Surgeon: [name], MD;  Location: [facility];  Service: Neurosurgery;  Laterality: Right;   CRANIOTOMY Right 10/14/2020   Procedure: Redo Right CRANIOTOMY HEMATOMA EVACUATION SUBDURAL;  Surgeon: [name], MD;  Location: [facility];  Service: Neurosurgery;  Laterality: Right;   INCISION AND DRAINAGE  12/2012   sebacebus cyst, infected   MANDIBLE FRACTURE SURGERY     [name] N/A 07/16/2011   Procedure: Earney Mallet;  Surgeon: Angelia Mould, MD;  Location: [facility] CATH LAB;  Service: Cardiovascular;  Laterality: N/A;   Social History:  reports that he has been smoking cigarettes. He started smoking about 4 years ago. He has a 3.00 pack-year smoking history. He has never used  smokeless tobacco. He reports current alcohol use. He reports that he does not use drugs.  Family / Support Systems Marital Status: Divorced Spouse/Significant Other: Divorced Children: No children Other Supports: N/A Anticipated Caregiver: MOther (supervision) and brother Ability/Limitations of Caregiver: Pt reports mother has Alzheimer's and believes brother has developmental delay (high functioning autism but no diagnosis). Caregiver Availability: 24/7 Family Dynamics: Pt lives with his mother [phone]) and brother ([phone]) in which he was the primary caregiver and drove everyone to appointments.  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 Education: some Medical sales representative - How often do you need to have someone help you when you read instructions, pamphlets, or other written material from your doctor or pharmacy?: Never Writes: Yes Employment Status: Disabled Date Retired/Disabled/Unemployed: SSDI- unsure on date likely when he initially started dialysis in 2006 Legal History/Current Legal Issues: Denies Guardian/Conservator: N/A   Abuse/Neglect Abuse/Neglect Assessment Can Be Completed: Yes Physical Abuse: Denies Verbal Abuse: Denies Sexual Abuse: Denies Exploitation of patient/patient's resources: Denies Self-Neglect: Denies  Patient response to: Social Isolation - How often do you feel lonely or isolated from those around you?: Never  Emotional Status Pt's affect, behavior and adjustment status: Pt in good spirits at time of visit. Pt adjusting as well as to be expected. Pt still does not know how he made it to the hospital or what led to him passing out. Recent Psychosocial Issues: Denies  Psychiatric History: Denies Substance Abuse History: Pt admits that he has not smoked cigarettes since he has entered the hospital. State that he was smoking a little less than 1/2 ppd. Admits to alochol  use in which he stated he was drikning vodka daily and unsure on the amount.  Patient / Family Perceptions, Expectations & Goals Pt/Family understanding of illness & functional limitations: Pt and family appear to have a general undertstanding of pt care needs Premorbid pt/family roles/activities: Independent Anticipated changes in roles/activities/participation: Assistance with ADLs/IADLs Pt/family expectations/goals: Pt unable to answer. Pt state " I don't know. Whatever I am supposed to be working on, I want to work onLevi Strauss: None Premorbid Home Care/DME Agencies: None Transportation available at discharge: TBD Is the patient able to respond to transportation needs?: Yes In the past 12 months, has lack of transportation kept you from medical appointments or from getting medications?: No In the past 12 months, has lack of transportation kept you from meetings, work, or from getting things needed for daily living?: No Resource referrals recommended: Neuropsychology  Discharge Planning Living Arrangements: Parent, Other relatives Support Systems: Parent, Other relatives, Friends/neighbors Type of Residence: Private residence Insurance Resources: [employer] (specify county) ([place]) Museum/gallery curator Resources: [employer] Financial Screen Referred: No Living Expenses: Medical laboratory scientific officer Management: Family Does the patient have any problems obtaining your medications?: No Home Management: Pt and brother help manage homecare needs Patient/Family Preliminary Plans: TBD Care Coordinator Barriers to Discharge: Decreased caregiver support, Lack of/limited family support, Insurance for SNF coverage, Hemodialysis Care Coordinator Anticipated Follow Up Needs: HH/OP Expected length of stay: 7-10 days  Clinical Impression SW met with pt in room to introduce self, explain role, and discuss discharge process. Pt is not a English as a second language teacher. No HCPOA. DME: crutches and canes.    Rana Snare 10/18/2020, 8:57 PM

## 2020-10-19 DIAGNOSIS — D649 Anemia, unspecified: Secondary | ICD-10-CM | POA: Diagnosis not present

## 2020-10-19 DIAGNOSIS — R7303 Prediabetes: Secondary | ICD-10-CM | POA: Diagnosis not present

## 2020-10-19 DIAGNOSIS — N186 End stage renal disease: Secondary | ICD-10-CM

## 2020-10-19 DIAGNOSIS — Z992 Dependence on renal dialysis: Secondary | ICD-10-CM

## 2020-10-19 DIAGNOSIS — S065XAA Traumatic subdural hemorrhage with loss of consciousness status unknown, initial encounter: Secondary | ICD-10-CM | POA: Diagnosis not present

## 2020-10-19 DIAGNOSIS — I10 Essential (primary) hypertension: Secondary | ICD-10-CM | POA: Diagnosis not present

## 2020-10-19 LAB — RENAL FUNCTION PANEL
Albumin: 2.5 g/dL — ABNORMAL LOW (ref 3.5–5.0)
Anion gap: 7 (ref 5–15)
BUN: 37 mg/dL — ABNORMAL HIGH (ref 8–23)
CO2: 27 mmol/L (ref 22–32)
Calcium: 7.9 mg/dL — ABNORMAL LOW (ref 8.9–10.3)
Chloride: 103 mmol/L (ref 98–111)
Creatinine, Ser: 7.46 mg/dL — ABNORMAL HIGH (ref 0.61–1.24)
GFR, Estimated: 8 mL/min — ABNORMAL LOW (ref >60)
Glucose, Bld: 106 mg/dL — ABNORMAL HIGH (ref 70–99)
Phosphorus: 4.5 mg/dL (ref 2.5–4.6)
Potassium: 4.5 mmol/L (ref 3.5–5.1)
Sodium: 137 mmol/L (ref 135–145)

## 2020-10-19 LAB — CBC
HCT: 24.8 % — ABNORMAL LOW (ref 39.0–52.0)
Hemoglobin: 7.5 g/dL — ABNORMAL LOW (ref 13.0–17.0)
MCH: 27.9 pg (ref 26.0–34.0)
MCHC: 30.2 g/dL (ref 30.0–36.0)
MCV: 92.2 fL (ref 80.0–100.0)
Platelets: 198 10*3/uL (ref 150–400)
RBC: 2.69 MIL/uL — ABNORMAL LOW (ref 4.22–5.81)
RDW: 16.2 % — ABNORMAL HIGH (ref 11.5–15.5)
WBC: 8.8 10*3/uL (ref 4.0–10.5)
nRBC: 0 % (ref 0.0–0.2)

## 2020-10-19 LAB — GLUCOSE, CAPILLARY
Glucose-Capillary: 111 mg/dL — ABNORMAL HIGH (ref 70–99)
Glucose-Capillary: 150 mg/dL — ABNORMAL HIGH (ref 70–99)

## 2020-10-19 NOTE — Progress Notes (Signed)
SLP Cancellation Note  Patient Details Name: CYAN MOULTRIE MRN: [id number] DOB: 08-Nov-1955   Cancelled treatment:       Patient missed 60 minutes of skilled SLP intervention due to being off the unit for dialysis. Will re-attempt as able.   Markella Dao 10/19/2020, 2:17 PM

## 2020-10-19 NOTE — Progress Notes (Signed)
Patient ID: Christopher Chapman, male   DOB: 05/22/1955, 65 y.o.   MRN: [id number]  SW called [place] [phone]) to set up Medicaid transportation. SW was informed pt has [facility] and would need to contact provider. SW working with admissions team to get understanding on pt insurance.  *SW spoke with [place] [employer] Customer Service [phone]) to confirm pt policy. Reports pt policy is not effective until 76/02/8313, policy #[id number].   SW called [facility] to provide updates. SW waiting on follow-up from pt assigned Caseworker to arrange Medicaid transportation.   SW sent [place] application to [employer] since pt will require dialysis at d/c. SW waiting on follow-up for pre-approval.   Loralee Pacas, MSW, [place] Office: [phone] Cell: [phone] Fax: [phone]

## 2020-10-19 NOTE — Progress Notes (Addendum)
Occupational Therapy TBI Note  Patient Details  Name: Christopher Chapman MRN: [id number] Date of Birth: [birth year]  Today's Date: 10/19/2020 OT Individual Time: 4128-7867 OT Individual Time Calculation (min): 60 min    Short Term Goals: Week 1:  OT Short Term Goal 1 (Week 1): STG = LTG due to estimated LOS  Skilled Therapeutic Interventions/Progress Updates:    Pt received asleep in bed, easily awoken with no c/o pain except for stating that his L leg felt "funny" and he didn't have the words to describe it. Pt hyperverbal during tx with frequent comments that nothing was wrong with him. Pt aware that he had surgery, but cannot remember why.    ADL Pt completes BADL at overall MIN level with MAX cuing for sequencing and initiation. Increased time needed to command follow d/t short attention span and processing.   Skilled interventions include: Pt completes bed mobility supine > EOB with supervision. Pt with slight LOB to R side seated EOB - did not acknowledge LOB was attributed to surgery. Very talkative with perseveration on getting his tooth pulled out some time last year. Required MOD directional cuing for donning socks d/t distractibility. Pt perseverated on tray table location and location of OTS before standing. Pt held RW in air to ambulate to wc, ignoring all cues to put walker on floor insisting that he can walk and he's fine, etc. Pt initially did not want to doff shirt because "he would get too cold" and requires MAX cuing for attention to task. Pt able to complete UB bathing seated in wc at sink level with MAX directional cuing to wash R arm, armpits and abdomen. LB bathing completed both sitting/standing with MOD cuing to stand to wash buttocks. Pt needed MOD A to orient to pants, receiving education on visual cues (tag & pocket). Pt donned pants backwards, and required MIN A to problem-solve to orient properly. Pt pulled pants over hip in standing. Pt provided with skilled education  on residual deficits of brain surgery to help him recognize deficits. Pt left at end of session in wc with exit belt alarm on, call light in reach and all needs met.   Therapy Documentation Precautions:  Precautions Precautions: Fall Restrictions Weight Bearing Restrictions: No Pain:  Stated his L leg felt "funny". Denied that it could have anything to do with brain surgery.    Agitated Behavior Scale: TBI Observation Details Observation Environment: pts room Start of observation period - Date: 10/19/20 Start of observation period - Time: 0815 End of observation period - Date: 10/19/20 End of observation period - Time: 0915 Agitated Behavior Scale (DO NOT LEAVE BLANKS) Short attention span, easy distractibility, inability to concentrate: Present to a moderate degree Impulsive, impatient, low tolerance for pain or frustration: Present to a slight degree Uncooperative, resistant to care, demanding: Present to a slight degree Violent and/or threatening violence toward people or property: Absent Explosive and/or unpredictable anger: Absent Rocking, rubbing, moaning, or other self-stimulating behavior: Absent Pulling at tubes, restraints, etc.: Absent Wandering from treatment areas: Absent Restlessness, pacing, excessive movement: Present to a slight degree Repetitive behaviors, motor, and/or verbal: Present to a moderate degree Rapid, loud, or excessive talking: Present to a moderate degree Sudden changes of mood: Present to a slight degree Easily initiated or excessive crying and/or laughter: Absent Self-abusiveness, physical and/or verbal: Absent Agitated behavior scale total score: 24   Therapy/Group: Individual Therapy  Danna Casella 10/19/2020, 6:56 AM

## 2020-10-19 NOTE — Plan of Care (Signed)
Behavioral Plan   Rancho Level: Rancho 7  Behavior to decrease/ eliminate: decreased safety awareness, impulsivity, getting OOB without help, verbosity, topic maintenance   Changes to environment:  Telesitter Lights on/blinds open during day  Interventions: Sleep wake chart Reminders to use AD as recommended by PT (keeping AD on floor) TV off d/t decreased attention to task as needed   Recommendations for interactions with patient: Educate on residual deficits from TBI/brain surgery- can refer to PT balance assessments as need for rehab Orient to situation as needed Be direct when pt is being verbose/distracted Pt responds well to joking   Attendees: Mariane Masters MOTR/L Weston Anna SLP Dorthula Nettles RN

## 2020-10-19 NOTE — Progress Notes (Signed)
Patient ID: Christopher Chapman, male   DOB: December 03, 1955, 65 y.o.   MRN: [id number]  [facility] Progress Note   Assessment/ Plan:   1.  Chronic subdural hematoma: Status post evacuation originally on 9/28 with repeat evacuation surgery performed 10/7.  Currently admitted to inpatient rehabilitation unit for continued intensive PT/OT for management of functional deficits. 2. 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) following allograft rejection in April, 2021.  On schedule for hemodialysis later today to continue MWF schedule will be on at [facility] (first shift) upon discharge. 3. Anemia: Low hemoglobin and hematocrit without overt blood loss status post SDH evacuation.  Continue ESA and avoiding heparin with dialysis. 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 5. Nutrition: Continue renal diet with oral nutritional supplements and renal multivitamin. 6. Hypertension: Blood pressure under decent control, will continue to monitor with hemodialysis and ongoing antihypertensive therapy.  Subjective:   Without any complaints at this time including chest pain or shortness of breath.   Objective:   BP (!) 144/66 (BP Location: Right Arm)   Pulse 75   Temp 98.9 F (37.2 C)   Resp 18   Ht 6\' 1"  (1.854 m)   Wt 98.7 kg   SpO2 100%   BMI 28.71 kg/m   Physical Exam: Gen: Sitting up on the edge of his bed working with therapist at his side. CVS: Pulse regular rhythm, normal rate, S1 and S2 normal Resp: Clear to auscultation bilaterally, no rales/rhonchi Abd: Soft, obese, nontender, bowel sounds normal Ext: Trace lower extremity edema with left upper arm brachiocephalic fistula  Labs: BMET Recent Labs  Lab 10/13/20 0309 10/14/20 0307 10/15/20 0154 10/16/20 0625 10/17/20 0221  NA 134* 133* 131* 134* 133*  K 3.7 3.9 4.4 4.1 3.9  CL 98 98 98 98 97*   CO2 25 25 22 23 27   GLUCOSE 116* 108* 156* 100* 102*  BUN 36* 48* 56* 63* 37*  CREATININE 6.25* 7.69* 8.63* 9.84* 7.16*  CALCIUM 7.4* 6.9* 6.9* 6.7* 7.3*  PHOS  --   --   --   --  4.3   CBC Recent Labs  Lab 10/14/20 0307 10/15/20 0154 10/16/20 0625 10/17/20 0221  WBC 10.3 11.3* 8.4 8.4  HGB 7.6* 7.9* 7.4* 7.1*  HCT 24.5* 25.3* 23.4* 24.0*  MCV 89.1 88.2 88.6 91.3  PLT 174 220 197 197      Medications:     allopurinol  200 mg Oral Daily   amLODipine  5 mg Oral Daily   atorvastatin  40 mg Oral Daily   calcitRIOL  0.5 mcg Oral Q M,W,F-HD   Chlorhexidine Gluconate Cloth  6 each Topical Q0600   darbepoetin (ARANESP) injection - DIALYSIS  60 mcg Intravenous Q Mon-HD   insulin aspart  0-15 Units Subcutaneous TID WC   labetalol  300 mg Oral TID   levETIRAcetam  250 mg Oral BID   losartan  25 mg Oral Daily   mycophenolate  360 mg Oral BID   pantoprazole  40 mg Oral QHS   senna  1 tablet Oral BID   sevelamer carbonate  800 mg Oral TID WC   tacrolimus  2 mg Oral BID     Elmarie Shiley, MD 10/19/2020, 8:48 AM

## 2020-10-19 NOTE — Progress Notes (Signed)
PROGRESS NOTE   Subjective/Complaints: Patient seen sitting up in his chair this morning.  He states he slept well overnight.  He believes he had a good first day of therapies yesterday.  He is waiting therapies to begin.  He states he feels he has an STD; he states he knows his body.  He was seen by nephrology this a.m., notes reviewed-no changes.  ROS: Denies CP, SOB, N/V/D, dysuria  Objective:   VAS [place] LOWER EXTREMITY VENOUS (DVT)  Result Date: 10/18/2020  Lower Venous DVT Study Patient Name:  Christopher Chapman  Date of Exam:   10/18/2020 Medical Rec #: [id number]         Accession #:    [id number] Date of Birth: Jun 15, 1955         Patient Gender: M Patient Age:   65 years Exam Location:  [facility] Procedure:      VAS [place] LOWER EXTREMITY VENOUS (DVT) Referring Phys: Lauraine Rinne --------------------------------------------------------------------------------  Indications: Swelling, and Edema.  Performing Technologist: Archie Patten RVS  Examination Guidelines: A complete evaluation includes B-mode imaging, spectral Doppler, color Doppler, and power Doppler as needed of all accessible portions of each vessel. Bilateral testing is considered an integral part of a complete examination. Limited examinations for reoccurring indications may be performed as noted. The reflux portion of the exam is performed with the patient in reverse Trendelenburg.  +---------+---------------+---------+-----------+----------+--------------+ RIGHT    CompressibilityPhasicitySpontaneityPropertiesThrombus Aging +---------+---------------+---------+-----------+----------+--------------+ CFV      Full           Yes      Yes                                 +---------+---------------+---------+-----------+----------+--------------+ SFJ      Full                                                         +---------+---------------+---------+-----------+----------+--------------+ FV Prox  Full                                                        +---------+---------------+---------+-----------+----------+--------------+ FV Mid   Full                                                        +---------+---------------+---------+-----------+----------+--------------+ FV DistalFull                                                        +---------+---------------+---------+-----------+----------+--------------+  PFV      Full                                                        +---------+---------------+---------+-----------+----------+--------------+ POP      Full           Yes      Yes                                 +---------+---------------+---------+-----------+----------+--------------+ PTV      Full                                                        +---------+---------------+---------+-----------+----------+--------------+ PERO     Full                                                        +---------+---------------+---------+-----------+----------+--------------+   +---------+---------------+---------+-----------+----------+--------------+ LEFT     CompressibilityPhasicitySpontaneityPropertiesThrombus Aging +---------+---------------+---------+-----------+----------+--------------+ CFV      Full           Yes      Yes                                 +---------+---------------+---------+-----------+----------+--------------+ SFJ      Full                                                        +---------+---------------+---------+-----------+----------+--------------+ FV Prox  Full                                                        +---------+---------------+---------+-----------+----------+--------------+ FV Mid   Full                                                         +---------+---------------+---------+-----------+----------+--------------+ FV DistalFull                                                        +---------+---------------+---------+-----------+----------+--------------+ PFV      Full                                                        +---------+---------------+---------+-----------+----------+--------------+  POP      Full           Yes      Yes                                 +---------+---------------+---------+-----------+----------+--------------+ PTV      Full                                                        +---------+---------------+---------+-----------+----------+--------------+ PERO     Full                                                        +---------+---------------+---------+-----------+----------+--------------+     Summary: BILATERAL: - No evidence of deep vein thrombosis seen in the lower extremities, bilaterally. -No evidence of popliteal cyst, bilaterally.   *See table(s) above for measurements and observations.    Preliminary    Recent Labs    10/17/20 0221  WBC 8.4  HGB 7.1*  HCT 24.0*  PLT 197    Recent Labs    10/17/20 0221  NA 133*  K 3.9  CL 97*  CO2 27  GLUCOSE 102*  BUN 37*  CREATININE 7.16*  CALCIUM 7.3*     Intake/Output Summary (Last 24 hours) at 10/19/2020 1012 Last data filed at 10/19/2020 0900 Gross per 24 hour  Intake 700 ml  Output 550 ml  Net 150 ml         Physical Exam: Vital Signs Blood pressure (!) 144/66, pulse 75, temperature 98.9 F (37.2 C), resp. rate 18, height 6\' 1"  (1.854 m), weight 98.7 kg, SpO2 100 %. Constitutional: No distress . Vital signs reviewed. HENT: Normocephalic.  Craniotomy to right scalp. Eyes: EOMI. No discharge. Cardiovascular: No JVD.  RRR. Respiratory: Normal effort.  No stridor.  Bilateral clear to auscultation. GI: Non-distended.  BS +. Skin: Warm and dry.  Intact.  Staples CDI. Psych: Verbose.   Tangential. Musc: No edema in extremities.  No tenderness in extremities. Neuro: Alert and oriented x3 Motor: Grossly intact  Assessment/Plan: 1. Functional deficits which require 3+ hours per day of interdisciplinary therapy in a comprehensive inpatient rehab setting. Physiatrist is providing close team supervision and 24 hour management of active medical problems listed below. Physiatrist and rehab team continue to assess barriers to discharge/monitor patient progress toward functional and medical goals  Care Tool:  Bathing    Body parts bathed by patient: Right arm, Left arm, Chest, Abdomen, Front perineal area, Buttocks, Right upper leg, Left upper leg, Right lower leg, Left lower leg, Face   Body parts bathed by helper: Right lower leg, Left lower leg     Bathing assist Assist Level: Minimal Assistance - Patient > 75%     Upper Body Dressing/Undressing Upper body dressing   What is the patient wearing?: Pull over shirt    Upper body assist Assist Level: Minimal Assistance - Patient > 75%    Lower Body Dressing/Undressing Lower body dressing      What is the patient wearing?: Pants     Lower body assist Assist for lower body dressing:  Minimal Assistance - Patient > 75%     Toileting Toileting    Toileting assist Assist for toileting: Contact Guard/Touching assist     Transfers Chair/bed transfer  Transfers assist     Chair/bed transfer assist level: Contact Guard/Touching assist     Locomotion Ambulation   Ambulation assist      Assist level: Minimal Assistance - Patient > 75% Assistive device: No Device Max distance: 29ft   Walk 10 feet activity   Assist     Assist level: Minimal Assistance - Patient > 75% Assistive device: No Device   Walk 50 feet activity   Assist    Assist level: Minimal Assistance - Patient > 75% Assistive device: No Device    Walk 150 feet activity   Assist Walk 150 feet activity did not occur:  Safety/medical concerns (fatigue, weakness, decreased balance/postural control)         Walk 10 feet on uneven surface  activity   Assist Walk 10 feet on uneven surfaces activity did not occur: Safety/medical concerns (fatigue, weakness, decreased balance/postural control)         Wheelchair     Assist Is the patient using a wheelchair?: Yes Type of Wheelchair: Manual    Wheelchair assist level: Supervision/Verbal cueing Max wheelchair distance: 12ft    Wheelchair 50 feet with 2 turns activity    Assist        Assist Level: Supervision/Verbal cueing   Wheelchair 150 feet activity     Assist      Assist Level: Supervision/Verbal cueing   Blood pressure (!) 144/66, pulse 75, temperature 98.9 F (37.2 C), resp. rate 18, height 6\' 1"  (1.854 m), weight 98.7 kg, SpO2 100 %.  Medical Problem List and Plan: 1.  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  Drains discontinued 10/16/2020 Continue CIR  2.  Antithrombotics: -DVT/anticoagulation: SCDs.   Vascular study personally reviewed, negative for DVT, will await final read             -antiplatelet therapy: N/A 3. Pain Management: Hydrocodone as needed 4. Mood: Provide emotional support             -antipsychotic agents: N/A 5. Neuropsych: This patient is?  Fully capable of making decisions on his own behalf. 6. Skin/Wound Care: Routine skin checks 7. Fluids/Electrolytes/Nutrition: encourage PO 8.  Seizure prophylaxis.  Keppra 250 mg twice daily-- no seizure activity should be able to stop prior to d/c from CIR 9.  End-stage renal disease with decreased donor renal transplantatiion 09/13/2016.  Continue hemodialysis as directed  -HD after therapies to maximize participation during the day  Appreciate nephro recs 10.  Acute on chronic anemia.   Continue Aranesp  Hemoglobin 7.1 on 10/10  Labs with HD 11.  Hypertension.  Norvasc 5 mg daily, labetalol 300 mg 3 times daily, Cozaar 25 mg daily.     Relatively controlled on 10/12 12.  Prediabetes.  Hemoglobin A1c 5.6.  SSI  Controlled on 10/12 13.  History of polysubstance abuse.  Counseling, states he has being using ETOH and tobacco but not other substances 14.?  STD  Will begin with ordering UA/urine culture   LOS: 2 days A FACE TO FACE EVALUATION WAS PERFORMED  Amsi Grimley Lorie Phenix 10/19/2020, 10:12 AM

## 2020-10-19 NOTE — Care Management (Signed)
[facility] Individual Statement of Services  Patient Name:  Christopher Chapman  Date:  10/19/2020  Welcome to the [facility].  Our goal is to provide you with an individualized program based on your diagnosis and situation, designed to meet your specific needs.  With this comprehensive rehabilitation program, you will be expected to participate in at least 3 hours of rehabilitation therapies Monday-Friday, with modified therapy programming on the weekends.  Your rehabilitation program will include the following services:  Physical Therapy (PT), Occupational Therapy (OT), Speech Therapy (ST), 24 hour per day rehabilitation nursing, Therapeutic Recreaction (TR), Psychology, Neuropsychology, Care Coordinator, Rehabilitation Medicine, [place], and Other  Weekly team conferences will be held on Tuesdays to discuss your progress.  Your Inpatient Rehabilitation Care Coordinator will talk with you frequently to get your input and to update you on team discussions.  Team conferences with you and your family in attendance may also be held.  Expected length of stay: 7-10 days  Overall anticipated outcome: Independent with an Assistive Device  Depending on your progress and recovery, your program may change. Your Inpatient Rehabilitation Care Coordinator will coordinate services and will keep you informed of any changes. Your Inpatient Rehabilitation Care Coordinator's name and contact numbers are listed  below.  The following services may also be recommended but are not provided by the [facility] will be made to provide these services after discharge if needed.  Arrangements include referral to agencies that provide these services.  Your insurance has been verified to be:   Medicaid  Your primary doctor is:  [name] Persia  Pertinent information will be shared with your doctor and your insurance company.  Inpatient Rehabilitation Care Coordinator:  Cathleen Corti [phone] or ([phone]  Information discussed with and copy given to patient by: Rana Snare, 10/19/2020, 10:36 AM

## 2020-10-19 NOTE — Progress Notes (Signed)
Physical Therapy Session Note  Patient Details  Name: Christopher Chapman MRN: [id number] Date of Birth: Dec 02, 1955  Today's Date: 10/19/2020 PT Individual Time: 0920-1032 PT Individual Time Calculation (min): 72 min   Short Term Goals: Week 1:  PT Short Term Goal 1 (Week 1): STG=LTG due to LOS  Skilled Therapeutic Interventions/Progress Updates:    Pt received sitting in w/c and agreeable to therapy session. Pt able to recall that he needs to call for assistance if wanting to get up. MD in/out for morning assessment. Pt hyperverbal and perseverative on a couple of statements (specifically being cold and his feet feeling funny) but otherwise tangential throughout session requiring cuing to reorient to current task. Transported to/from gym in w/c for time management and energy conservation. Gait training ~149ft, no AD, with light min assist for balance - demos reciprocal stepping pattern with decreased gait speed and mild R/L sway and instability. Participated in the miniBEST balance assessment scoring 8/28 indicating increased fall risk. Pt reports need to use bathroom. Transported to/from ADL apartment as pt's bathroom currently undergoing service. Standing with close supervision performed LB clothing management - continent of bowels and performed standing peri-care with pt requesting privacy therefore distant supervision provided. Dynamic gait training with focus on visual scanning and dual-cognitive task to locate targets in hallway in ascending numerical order - requires light min assist for balance and once pt oriented to task able to complete with slight increased time. Throughout session pt reporting fatigue requiring encouragement to continue participating. Transported back to room in w/c. Stand pivot to EOB with CGA. Sit>supine supervision. Pt unable to recall that he wanted to call to change lunch order therefore therapist assisted him in calling. Pt left supine in bed with needs in reach and bed  alarm on.    MiniBEST Assesment- Balance Evaluation Systems Test  Sit to Stand Instruction: "Cross your arms across your chest. Try not to use your hands unless you must. Do not let your legs lean against the back of the chair when you stand. Please stand up now." (2) Normal: Comes to stand without use of hands and stabilizes independently. (1) Moderate: Comes to stand WITH use of hands on first attempt. (0) Severe: Unable to stand up from chair without assistance, OR needs several attempts with use of hands.   2. Rise to toes Instruction: "Place your feet shoulder width apart. Place your hands on your hips. Try to rise as high as you can onto your toes. I will count out loud to 3 seconds. Try to hold this pose for at least 3 seconds. Look straight ahead. Rise now." (2) Normal: Stable for 3 s with maximum height. (1) Moderate: Heels up, but not full range (smaller than when holding hands), OR noticeable instability for 3 s. (0) Severe: < 3 s  3. Stand on 1 Leg Instruction: "Look straight ahead. Keep your hands on your hips. Lift your leg off of the ground behind you without touching or resting your raised leg upon your other standing leg. Stay standing on one leg as long as you can. Look straight ahead. Lift now." Right: Time in Seconds Trial 1:__1 second___Trial 2:__3 second___ Left: Time in Seconds Trial 1:___1 second__Trial 2:__1 second___ (2) Normal: 20 s. (1) Moderate: < 20 s. (0) Severe: Unable.  To score each side separately use the trial with the longest time. To calculate the sub-score and total score use the side [left or right] with the lowest numerical score [i.e. the worse side].  4. Compensatory stepping correction-forward Instruction: "Stand with your feet shoulder width apart, arms at your sides. Lean forward against my hands beyond your forward limits. When I let go, do whatever is necessary, including taking a step, to avoid a fall." (2) Normal: Recovers  independently with a single, large step (second realignment step is allowed). (1) Moderate: More than one step used to recover equilibrium. (0) Severe: No step, OR would fall if not caught, OR falls spontaneously.   5. Compensatory stepping correction- backward (same scoring as item 4) Instruction: "Stand with your feet shoulder width apart, arms at your sides. Lean backward against my hands beyond your backward limits. When I let go, do whatever is necessary, including taking a step, to avoid a fall."  (0) Severe: No step, OR would fall if not caught, OR falls spontaneously.   6. Compensatory stepping correction- lateral (same scoring as item 4- score worse side in total) Instruction: "Stand with your feet together, arms down at your sides. Lean into my hand beyond your sideways limit. When I let go, do whatever is necessary, including taking a step, to avoid a fall."   Right: (0) Severe: No step, OR would fall if not caught, OR falls spontaneously.   Left: (1) Moderate: More than one step used to recover equilibrium.  7. Stance (feet together) eyes open, firm surface Instruction: "Place your hands on your hips. Place your feet together until almost touching. Look straight ahead. Be as stable and still as possible, until I say stop."  Time in seconds:________  (2) Normal: 30 s.  (1) Moderate: < 30 s.  (0) Severe: Unable.  8. Stance (feet together; eyes closed foam surface Instruction: "Step onto the foam. Place your hands on your hips. Place your feet together until almost touching. Be as stable and still as possible, until I say stop. I will start timing when you close your eyes."  Time in seconds:________  (2) Normal: 30 s.  (1) Moderate: < 30 s.  (0) Severe: Unable.   9. Incline- eyes closed Instruction: "Step onto the incline ramp. Please stand on the incline ramp with your toes toward the top. Place your feet shoulder width apart and have your arms down at your sides. I will start  timing when you close your eyes."  Time in seconds:________  (2) Normal: Stands independently 30 s and aligns with gravity.  (1) Moderate: Stands independently <30 s OR aligns with surface.  (0) Severe: Unable.   10. Change in gait speed Instruction: "Begin walking at your normal speed, when I tell you 'fast', walk as fast as you can. When I say 'slow', walk very slowly." (2) Normal: Significantly changes walking speed without imbalance. (1) Moderate: Unable to change walking speed or signs of imbalance. (0) Severe: Unable to achieve significant change in walking speed AND signs of imbalance.   11. Walk with head turns- horizontal Instruction: "Begin walking at your normal speed, when I say "right", turn your head and look to the right. When I say "left" turn your head and look to the left. Try to keep yourself walking in a straight line." (2) Normal: performs head turns with no change in gait speed and good balance. (1) Moderate: performs head turns with reduction in gait speed. (0) Severe: performs head turns with imbalance.  12. Walk with pivot turns Instruction: "Begin walking at your normal speed. When I tell you to 'turn and stop', turn as quickly as you can, face the opposite direction, and stop.  After the turn, your feet should be close together." (2) Normal: Turns with feet close FAST (< 3 steps) with good balance. (1) Moderate: Turns with feet close SLOW (>4 steps) with good balance. (0) Severe: Cannot turn with feet close at any speed without imbalance.   13. Step over obstacles Instruction: "Begin walking at your normal speed. When you get to the box, step over it, not around it and keep walking." (2) Normal: Able to step over box with minimal change of gait speed and with good balance. (1) Moderate: Steps over box but touches box OR displays cautious behavior by slowing gait. (0) Severe: Unable to step over box OR steps around box  14. Timed up and go 3 meter walk (TUG) &  TUG with dual task Instruction TUG: "When I say 'Go', stand up from chair, walk at your normal speed across the tape on the floor, turn around, and come back to sit in the chair." Instruction TUG with Dual Task: "Count backwards by threes starting at _30_. When I say 'Go', stand up from chair, walk at your normal speed across the tape on the floor, turn around, and come back to sit in the chair. Continue counting backwards the entire time."  TUG: __18.24____seconds; Dual Task TUG: ___16.45_____seconds (2) Normal: No noticeable change in sitting, standing or walking while backward counting when compared to TUG without Dual Task. (1) Moderate: Dual Task affects either counting OR walking (>10%) when compared to the TUG without Dual Task. (0) Severe: Stops counting while walking OR stops walking while counting.  SCORE 8/28  Therapy Documentation Precautions:  Precautions Precautions: Fall Restrictions Weight Bearing Restrictions: No   Pain:  No reports of pain though pt does report "tingling" in his feet and stating they feel "weird."    Therapy/Group: Individual Therapy  Tawana Scale , PT, DPT, NCS, CSRS  10/19/2020, 7:52 AM

## 2020-10-20 DIAGNOSIS — S065XAA Traumatic subdural hemorrhage with loss of consciousness status unknown, initial encounter: Secondary | ICD-10-CM | POA: Diagnosis not present

## 2020-10-20 DIAGNOSIS — F1910 Other psychoactive substance abuse, uncomplicated: Secondary | ICD-10-CM | POA: Diagnosis not present

## 2020-10-20 DIAGNOSIS — N186 End stage renal disease: Secondary | ICD-10-CM | POA: Diagnosis not present

## 2020-10-20 DIAGNOSIS — I10 Essential (primary) hypertension: Secondary | ICD-10-CM | POA: Diagnosis not present

## 2020-10-20 LAB — GLUCOSE, CAPILLARY
Glucose-Capillary: 97 mg/dL (ref 70–99)
Glucose-Capillary: 97 mg/dL (ref 70–99)
Glucose-Capillary: 100 mg/dL — ABNORMAL HIGH (ref 70–99)
Glucose-Capillary: 113 mg/dL — ABNORMAL HIGH (ref 70–99)

## 2020-10-20 LAB — URINALYSIS, COMPLETE (UACMP) WITH MICROSCOPIC
Bilirubin Urine: NEGATIVE
Glucose, UA: 50 mg/dL — AB
Hgb urine dipstick: NEGATIVE
Ketones, ur: NEGATIVE mg/dL
Nitrite: NEGATIVE
Protein, ur: >=300 mg/dL — AB
Specific Gravity, Urine: 1.009 (ref 1.005–1.030)
pH: 8 (ref 5.0–8.0)

## 2020-10-20 NOTE — Progress Notes (Signed)
Occupational Therapy TBI Note  Patient Details  Name: Christopher Chapman MRN: [id number] Date of Birth: 1955-02-04  Today's Date: 10/20/2020 OT Individual Time: 1005-1059 OT Individual Time Calculation (min): 54 min   Short Term Goals: Week 1:  OT Short Term Goal 1 (Week 1): STG = LTG due to estimated LOS  Skilled Therapeutic Interventions/Progress Updates:     Pt greeted semi-reclined in bed and agreeable to shower with encouragement. Pt reported fatigue and that he does not like being cold. Pt verbose throughout session but able to redirect. Pt ambulated to bathroom with RW and CGA. Pt sat on commode and had successful BM. Peri-care completed with supervision. CGA to ambulate to shower without AD. Pt bathed sit<>stand with CGA for balance and verbal cues for thoroughness and to recall which body parts he had washed. Pt impuslive to stand out of shower requiring cues for safety awareness. Dressing tasks seated EOB with overall CGA when pulling up pants. Pt returned to supine and left semi-reclined in bed with needs met.   Therapy Documentation Precautions:  Precautions Precautions: Fall Restrictions Weight Bearing Restrictions: No Pain:  Denies pain Agitated Behavior Scale: TBI Observation Details Observation Environment: Room Start of observation period - Date: 10/20/20 Start of observation period - Time: 0100 End of observation period - Date: 10/20/20 End of observation period - Time: 1100 Agitated Behavior Scale (DO NOT LEAVE BLANKS) Short attention span, easy distractibility, inability to concentrate: Present to a moderate degree Impulsive, impatient, low tolerance for pain or frustration: Present to a slight degree Uncooperative, resistant to care, demanding: Present to a slight degree Violent and/or threatening violence toward people or property: Absent Explosive and/or unpredictable anger: Absent Rocking, rubbing, moaning, or other self-stimulating behavior: Absent Pulling  at tubes, restraints, etc.: Absent Wandering from treatment areas: Absent Restlessness, pacing, excessive movement: Present to a slight degree Repetitive behaviors, motor, and/or verbal: Present to a moderate degree Rapid, loud, or excessive talking: Present to a moderate degree Sudden changes of mood: Present to a slight degree Easily initiated or excessive crying and/or laughter: Absent Self-abusiveness, physical and/or verbal: Absent Agitated behavior scale total score: 24   Therapy/Group: Individual Therapy  Valma Cava 10/20/2020, 1:54 PM

## 2020-10-20 NOTE — Progress Notes (Signed)
PROGRESS NOTE   Subjective/Complaints: No new issues this morning. Missed therapy time yesterday due to afternoon HD  ROS: Patient denies fever, rash, sore throat, blurred vision, nausea, vomiting, diarrhea, cough, shortness of breath or chest pain, joint or back pain, headache, or mood change.   Objective:   VAS [place] LOWER EXTREMITY VENOUS (DVT)  Result Date: 10/19/2020  Lower Venous DVT Study Patient Name:  Christopher Chapman  Date of Exam:   10/18/2020 Medical Rec #: [id number]         Accession #:    [id number] Date of Birth: Apr 01, 1955         Patient Gender: M Patient Age:   65 years Exam Location:  [facility] Procedure:      VAS [place] LOWER EXTREMITY VENOUS (DVT) Referring Phys: Lauraine Rinne --------------------------------------------------------------------------------  Indications: Swelling, and Edema.  Performing Technologist: Archie Patten RVS  Examination Guidelines: A complete evaluation includes B-mode imaging, spectral Doppler, color Doppler, and power Doppler as needed of all accessible portions of each vessel. Bilateral testing is considered an integral part of a complete examination. Limited examinations for reoccurring indications may be performed as noted. The reflux portion of the exam is performed with the patient in reverse Trendelenburg.  +---------+---------------+---------+-----------+----------+--------------+ RIGHT    CompressibilityPhasicitySpontaneityPropertiesThrombus Aging +---------+---------------+---------+-----------+----------+--------------+ CFV      Full           Yes      Yes                                 +---------+---------------+---------+-----------+----------+--------------+ SFJ      Full                                                        +---------+---------------+---------+-----------+----------+--------------+ FV Prox  Full                                                         +---------+---------------+---------+-----------+----------+--------------+ FV Mid   Full                                                        +---------+---------------+---------+-----------+----------+--------------+ FV DistalFull                                                        +---------+---------------+---------+-----------+----------+--------------+ PFV      Full                                                        +---------+---------------+---------+-----------+----------+--------------+  POP      Full           Yes      Yes                                 +---------+---------------+---------+-----------+----------+--------------+ PTV      Full                                                        +---------+---------------+---------+-----------+----------+--------------+ PERO     Full                                                        +---------+---------------+---------+-----------+----------+--------------+   +---------+---------------+---------+-----------+----------+--------------+ LEFT     CompressibilityPhasicitySpontaneityPropertiesThrombus Aging +---------+---------------+---------+-----------+----------+--------------+ CFV      Full           Yes      Yes                                 +---------+---------------+---------+-----------+----------+--------------+ SFJ      Full                                                        +---------+---------------+---------+-----------+----------+--------------+ FV Prox  Full                                                        +---------+---------------+---------+-----------+----------+--------------+ FV Mid   Full                                                        +---------+---------------+---------+-----------+----------+--------------+ FV DistalFull                                                         +---------+---------------+---------+-----------+----------+--------------+ PFV      Full                                                        +---------+---------------+---------+-----------+----------+--------------+ POP      Full           Yes      Yes                                 +---------+---------------+---------+-----------+----------+--------------+  PTV      Full                                                        +---------+---------------+---------+-----------+----------+--------------+ PERO     Full                                                        +---------+---------------+---------+-----------+----------+--------------+     Summary: BILATERAL: - No evidence of deep vein thrombosis seen in the lower extremities, bilaterally. -No evidence of popliteal cyst, bilaterally.   *See table(s) above for measurements and observations. Electronically signed by Harold Barban MD on 10/19/2020 at 9:22:29 PM.    Final    Recent Labs    10/19/20 1150  WBC 8.8  HGB 7.5*  HCT 24.8*  PLT 198   Recent Labs    10/19/20 1150  NA 137  K 4.5  CL 103  CO2 27  GLUCOSE 106*  BUN 37*  CREATININE 7.46*  CALCIUM 7.9*    Intake/Output Summary (Last 24 hours) at 10/20/2020 0909 Last data filed at 10/19/2020 2124 Gross per 24 hour  Intake 358 ml  Output 2900 ml  Net -2542 ml        Physical Exam: Vital Signs Blood pressure 127/77, pulse 73, temperature 97.7 F (36.5 C), resp. rate 18, height 6\' 1"  (1.854 m), weight 101.2 kg, SpO2 100 %. Constitutional: No distress . Vital signs reviewed. HEENT: NCAT, EOMI, oral membranes moist Neck: supple Cardiovascular: RRR without murmur. No JVD    Respiratory/Chest: CTA Bilaterally without wheezes or rales. Normal effort    GI/Abdomen: BS +, non-tender, non-distended Ext: no clubbing, cyanosis, or edema Psych: pleasant and cooperative  Musc: No edema in extremities.  No tenderness in extremities. Neuro: Alert and  oriented x3 Motor: Grossly intact  Assessment/Plan: 1. Functional deficits which require 3+ hours per day of interdisciplinary therapy in a comprehensive inpatient rehab setting. Physiatrist is providing close team supervision and 24 hour management of active medical problems listed below. Physiatrist and rehab team continue to assess barriers to discharge/monitor patient progress toward functional and medical goals  Care Tool:  Bathing    Body parts bathed by patient: Right arm, Left arm, Chest, Abdomen, Front perineal area, Buttocks, Right upper leg, Left upper leg, Right lower leg, Left lower leg, Face   Body parts bathed by helper: Right lower leg, Left lower leg Body parts n/a: Right lower leg, Left lower leg   Bathing assist Assist Level: Minimal Assistance - Patient > 75%     Upper Body Dressing/Undressing Upper body dressing   What is the patient wearing?:  (paper shirt/pants)    Upper body assist Assist Level: Minimal Assistance - Patient > 75%    Lower Body Dressing/Undressing Lower body dressing      What is the patient wearing?:  (paper pants)     Lower body assist Assist for lower body dressing: Minimal Assistance - Patient > 75%     Toileting Toileting Toileting Activity did not occur (Clothing management and hygiene only): N/A (no void or bm)  Toileting assist Assist for toileting: Contact Guard/Touching assist     Transfers  Chair/bed transfer  Transfers assist     Chair/bed transfer assist level: Minimal Assistance - Patient > 75%     Locomotion Ambulation   Ambulation assist      Assist level: Minimal Assistance - Patient > 75% Assistive device: No Device Max distance: 141ft   Walk 10 feet activity   Assist     Assist level: Minimal Assistance - Patient > 75% Assistive device: No Device   Walk 50 feet activity   Assist    Assist level: Minimal Assistance - Patient > 75% Assistive device: No Device    Walk 150 feet  activity   Assist Walk 150 feet activity did not occur: Safety/medical concerns (fatigue, weakness, decreased balance/postural control)         Walk 10 feet on uneven surface  activity   Assist Walk 10 feet on uneven surfaces activity did not occur: Safety/medical concerns (fatigue, weakness, decreased balance/postural control)         Wheelchair     Assist Is the patient using a wheelchair?: Yes Type of Wheelchair: Manual    Wheelchair assist level: Supervision/Verbal cueing Max wheelchair distance: 113ft    Wheelchair 50 feet with 2 turns activity    Assist        Assist Level: Supervision/Verbal cueing   Wheelchair 150 feet activity     Assist      Assist Level: Supervision/Verbal cueing   Blood pressure 127/77, pulse 73, temperature 97.7 F (36.5 C), resp. rate 18, height 6\' 1"  (1.854 m), weight 101.2 kg, SpO2 100 %.  Medical Problem List and Plan: 1.  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  Drains discontinued 10/16/2020 -Continue CIR therapies including PT, OT, and SLP   2.  Antithrombotics: -DVT/anticoagulation: SCDs.   Vascular study negative             -antiplatelet therapy: N/A 3. Pain Management: Hydrocodone as needed 4. Mood: Provide emotional support             -antipsychotic agents: N/A 5. Neuropsych: This patient is?  Fully capable of making decisions on his own behalf. 6. Skin/Wound Care: Routine skin checks 7. Fluids/Electrolytes/Nutrition: encourage PO 8.  Seizure prophylaxis.  Keppra 250 mg twice daily-- no seizure activity should be able to stop prior to d/c from CIR 9.  End-stage renal disease with decreased donor renal transplantatiion 09/13/2016.  Continue hemodialysis as directed  -HD after therapies to maximize participation during the day  Appreciate  nephro recs 10.  Acute on chronic anemia.  Continue Aranesp  Hemoglobin 7.5--mgt per nephrology  Labs with HD 11.  Hypertension.  Norvasc 5 mg daily, labetalol 300 mg 3 times daily, Cozaar 25 mg daily.     Relatively controlled on 10/13 12.  Prediabetes.  Hemoglobin A1c 5.6.  SSI  Controlled on 10/12 13.  History of polysubstance abuse.  Counseling, states he has being using ETOH and tobacco but not other substances 14.?  STD  UA +/-, UCX ordered and pending?  -all urine yellow, clear   LOS: 3 days A FACE TO FACE EVALUATION WAS PERFORMED  Meredith Staggers 10/20/2020, 9:09 AM

## 2020-10-20 NOTE — Progress Notes (Signed)
Patient ID: ROLF FELLS, male   DOB: Dec 14, 1955, 65 y.o.   MRN: [id number]  Pt pre-certified for [employer] [place] for transportation services. Once pt insurance changes to [facility] beginning on 11/08/2020, pt will be required to pay $2 per way for services.   Loralee Pacas, MSW, [place] Office: [phone] Cell: [phone] Fax: [phone]

## 2020-10-20 NOTE — Progress Notes (Addendum)
Speech Language Pathology TBI Daily Session Note  Patient Details  Name: Christopher Chapman MRN: [id number] Date of Birth: 1955-12-31  Today's Date: 10/20/2020 SLP Individual Time: 0920-1000 SLP Individual Time Calculation (min): 40 min  Short Term Goals: Week 1: SLP Short Term Goal 1 (Week 1): STG=LTG due to short ELOS (7-10 days)  Skilled Therapeutic Interventions: Skilled treatment session focused on cognitive goals. SLP facilitated session by providing Max A verbal cues for recall of his current medications and their functions. Patient reported that although he took his medications everyday, he wanted strategies to utilize to take his medications consistently throughout the day. When SLP provided strategies, patient then he reported he was managing fine at home and didn't need to make any adjustments to his routine reflecting poor awareness. Patient left upright in bed with alarm on and all needs within reach. Continue with current plan of care.      Pain No/Denies Pain   Agitated Behavior Scale: TBI Observation Details Observation Environment: patient's room Start of observation period - Date: 10/20/20 Start of observation period - Time: 0920 End of observation period - Date: 10/20/20 End of observation period - Time: 1000 Agitated Behavior Scale (DO NOT LEAVE BLANKS) Short attention span, easy distractibility, inability to concentrate: Present to a moderate degree Impulsive, impatient, low tolerance for pain or frustration: Present to a slight degree Uncooperative, resistant to care, demanding: Present to a slight degree Violent and/or threatening violence toward people or property: Absent Explosive and/or unpredictable anger: Absent Rocking, rubbing, moaning, or other self-stimulating behavior: Absent Pulling at tubes, restraints, etc.: Absent Wandering from treatment areas: Absent Restlessness, pacing, excessive movement: Absent Repetitive behaviors, motor, and/or verbal:  Present to a moderate degree Rapid, loud, or excessive talking: Present to a moderate degree Sudden changes of mood: Present to a slight degree Easily initiated or excessive crying and/or laughter: Absent Self-abusiveness, physical and/or verbal: Absent Agitated behavior scale total score: 23    Therapy/Group: Individual Therapy  Obed Samek 10/20/2020, 3:50 PM

## 2020-10-20 NOTE — Progress Notes (Signed)
Patient ID: Christopher Chapman, male   DOB: 06-Dec-1955, 65 y.o.   MRN: [id number]  [facility] Progress Note   Assessment/ Plan:   1.  Chronic subdural hematoma: Status post evacuation originally on 9/28 with repeat evacuation surgery performed 10/7.  Currently at the inpatient rehabilitation unit for intensive PT/OT for management of functional deficits. 2. 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  On schedule for hemodialysis tomorrow to continue MWF schedule on which he will be on at [facility] (first shift) upon discharge. 3. Anemia: Low hemoglobin and hematocrit without overt blood loss status post SDH evacuation.  Continue ESA and avoiding heparin with dialysis. 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 5. Nutrition: Continue renal diet with oral nutritional supplements and renal multivitamin. 6. Hypertension: Blood pressure under decent control, will continue to monitor with hemodialysis and ongoing antihypertensive therapy.  Subjective:   Denies any complaints, earlier this morning engaged in PT/OT.   Objective:   BP 127/77 (BP Location: Right Arm)   Pulse 73   Temp 97.7 F (36.5 C)   Resp 18   Ht 6\' 1"  (1.854 m)   Wt 101.2 kg   SpO2 100%   BMI 29.44 kg/m   Physical Exam: Gen: Resting comfortably in bed, no apparent distress. CVS: Pulse regular rhythm, normal rate, S1 and S2 normal Resp: Clear to auscultation bilaterally, no rales/rhonchi Abd: Soft, obese, nontender, bowel sounds normal Ext: Trace lower extremity edema with left upper arm brachiocephalic fistula  Labs: BMET Recent Labs  Lab 10/14/20 0307 10/15/20 0154 10/16/20 0625 10/17/20 0221 10/19/20 1150  NA 133* 131* 134* 133* 137  K 3.9 4.4 4.1 3.9 4.5  CL 98 98 98 97* 103  CO2 25 22 23 27 27   GLUCOSE 108* 156* 100*  102* 106*  BUN 48* 56* 63* 37* 37*  CREATININE 7.69* 8.63* 9.84* 7.16* 7.46*  CALCIUM 6.9* 6.9* 6.7* 7.3* 7.9*  PHOS  --   --   --  4.3 4.5   CBC Recent Labs  Lab 10/15/20 0154 10/16/20 0625 10/17/20 0221 10/19/20 1150  WBC 11.3* 8.4 8.4 8.8  HGB 7.9* 7.4* 7.1* 7.5*  HCT 25.3* 23.4* 24.0* 24.8*  MCV 88.2 88.6 91.3 92.2  PLT 220 197 197 198      Medications:     allopurinol  200 mg Oral Daily   amLODipine  5 mg Oral Daily   atorvastatin  40 mg Oral Daily   calcitRIOL  0.5 mcg Oral Q M,W,F-HD   Chlorhexidine Gluconate Cloth  6 each Topical Q0600   darbepoetin (ARANESP) injection - DIALYSIS  60 mcg Intravenous Q Mon-HD   insulin aspart  0-15 Units Subcutaneous TID WC   labetalol  300 mg Oral TID   levETIRAcetam  250 mg Oral BID   losartan  25 mg Oral Daily   mycophenolate  360 mg Oral BID   pantoprazole  40 mg Oral QHS   senna  1 tablet Oral BID   sevelamer carbonate  800 mg Oral TID WC   tacrolimus  2 mg Oral BID     Elmarie Shiley, MD 10/20/2020, 11:30 AM

## 2020-10-20 NOTE — Progress Notes (Signed)
Physical Therapy Session Note  Patient Details  Name: Christopher Chapman MRN: [id number] Date of Birth: May 03, 1955  Today's Date: 10/20/2020 PT Individual Time: 4268-3419 PT Individual Time Calculation (min): 69 min   Short Term Goals: Week 1:  PT Short Term Goal 1 (Week 1): STG=LTG due to LOS  Skilled Therapeutic Interventions/Progress Updates:   Pt received supine in bed and agreeable to PT. Supine>sit transfer with supervision assist and cues for attention to task. Pt able to don socks with set up assist and increased time. Stanad pivot transfer to and from [facility] throughout session with supervision assist and min cues for use of arm rest to control speed of descent.   Gait training without UE support 4 x 44ft  with supervision assist mild unsteady gait pattern but no overt LOB  PT treatment focused on attention to task and dynamic balance. Pt performed wii bowling x 10 frames. Able to tolerate 6 frames and 2 frames in standing with supervision assist. PT required to provide moderate cues for attention to task and use of wii control.   Nustep sustained attention task and reciprocal movement training with Supervision assist from PT with cues to sustain SPM >40. Short therapeutic rest break at 4 min due to [place].   Standing balance while engaged in cognitive task of Checkers. Min cues throughout for following rules of game as well as reminders for playing the proper game piec   Pt returned to room and performed stand pivot transfer to bed with supervision assist for safety. Sit>supine completed without assist and left supine in bed with call bell in reach and all needs met.        Therapy Documentation Precautions:  Precautions Precautions: Fall Restrictions Weight Bearing Restrictions: No    Vital Signs: Therapy Vitals Temp: 97.7 F (36.5 C) Pulse Rate: 73 Resp: 18 BP: 127/77 Patient Position (if appropriate): Lying Oxygen Therapy SpO2: 100 % Pain: Pain  Assessment Pain Scale: 0-10 Pain Score: 0-No pain   Therapy/Group: Individual Therapy  Lorie Phenix 10/20/2020, 9:18 AM
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
## 2020-10-21 LAB — URINE CULTURE: Culture: <10000 — AB

## 2020-10-21 LAB — GLUCOSE, CAPILLARY
Glucose-Capillary: 113 mg/dL — ABNORMAL HIGH (ref 70–99)
Glucose-Capillary: 99 mg/dL (ref 70–99)
Glucose-Capillary: 98 mg/dL (ref 70–99)
Glucose-Capillary: 131 mg/dL — ABNORMAL HIGH (ref 70–99)

## 2020-10-21 MED ORDER — DARBEPOETIN ALFA 60 MCG/0.3ML IJ SOSY
60.0000 ug | PREFILLED_SYRINGE | INTRAMUSCULAR | Status: DC
  Administered 2020-10-24: 60 ug via INTRAVENOUS
  Filled 2020-10-21 (×2): qty 0.3

## 2020-10-21 NOTE — Progress Notes (Addendum)
Physical Therapy TBI Note  Patient Details  Name: Christopher Chapman MRN: [id number] Date of Birth: [birth year]  Today's Date: 10/21/2020 PT Individual Time: 2993-7169 PT Individual Time Calculation (min): 48 min  and Today's Date: 10/21/2020 PT Missed Time: 27 Minutes Missed Time Reason: Patient fatigue;Patient unwilling to participate (Pt reported fatigue and refused therapy)  Short Term Goals: Week 1:  PT Short Term Goal 1 (Week 1): STG=LTG due to LOS  Skilled Therapeutic Interventions/Progress Updates:  Pt received supine in bed, reported 2/10 pain in head and was premedicated. Pt hyperverbal throughout session and perseverated on therapist's name throughout session. Pt performed bed mobility w/S* and donned socks w/set-up assist at EOB. Pt had notable difficulty sustaining attention to task, heavy verbal cues to encourage him to get out of bed. Sit <>stand from EOB w/CGA and pt ambulated 100' w/RW and CGA for safety. Noted varied gait speed, as pt would speed up and slow down at random w/minor self-corrected posterior LOB.   NMR Re-ed -Pt requested to perform bicep curls w/10# Dbs. 2x10 sets w/added alt. shoulder press while pt reciting his favorite restaurants in [place] for set-switching and dual-task practice. Noted pt's task performance declined significantly while recalling restaurants, as his velocity and ROM decreased. -BITS bell cancellation task while standing on [employer] for improved ankle strategy and sustained attention to task. CGA throughout. Had pt say a word that began with "B" with each bell he found, heavy cues to remind pt of task. Noted significant L sided inattention, as pt only located bells on R side of screen. Pt suddenly reported lightheadedness, impulsively grabbed onto BITS and sat in chair nearby, min A to guide hips to chair. BP at 134/78 mmHg.   After several minutes, pt refused to continue therapy due to fatigue and "being done for the day". Sit <>stand from  chair and pt ambulated 100' back to room w/CGA. Noted bilateral foot drop, knee extension thrust into midstance bilaterally and exaggerated lateral trunk lean. Pt performed sit<>supine w/S* and was left supine in bed, all needs in reach, nursing notified of situation. Missed 27 minutes of skilled PT.   Therapy Documentation Precautions:  Precautions Precautions: Fall Restrictions Weight Bearing Restrictions: No Agitated Behavior Scale: TBI Observation Details Observation Environment: pt's room Start of observation period - Date: 10/21/20 Start of observation period - Time: 1000 End of observation period - Date: 10/21/20 End of observation period - Time: 1045 Agitated Behavior Scale (DO NOT LEAVE BLANKS) Short attention span, easy distractibility, inability to concentrate: Present to a moderate degree Impulsive, impatient, low tolerance for pain or frustration: Present to a slight degree Uncooperative, resistant to care, demanding: Present to a slight degree Violent and/or threatening violence toward people or property: Absent Explosive and/or unpredictable anger: Absent Rocking, rubbing, moaning, or other self-stimulating behavior: Absent Pulling at tubes, restraints, etc.: Absent Wandering from treatment areas: Absent Restlessness, pacing, excessive movement: Absent Repetitive behaviors, motor, and/or verbal: Present to a moderate degree Rapid, loud, or excessive talking: Present to a moderate degree Sudden changes of mood: Present to a moderate degree Easily initiated or excessive crying and/or laughter: Absent Self-abusiveness, physical and/or verbal: Absent Agitated behavior scale total score: 24   Therapy/Group: Individual Therapy Cruzita Lederer Ivi Griffith, PT, DPT  10/21/2020, 12:14 PM

## 2020-10-21 NOTE — Progress Notes (Signed)
[facility] NEPHROLOGY PROGRESS NOTE  Assessment/ Plan:  # Chronic subdural hematoma: Status post evacuation originally on 9/28 with repeat evacuation surgery performed 10/7.  Currently at the inpatient rehabilitation unit for intensive PT/OT for management of functional deficits.  #. 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  On Prograf and Myfortic.  Receiving HD MWF schedule, plan for HD today.  The outpatient HD arranged at [facility] (first shift) upon discharge.  # Anemia: Low hemoglobin and hematocrit without overt blood loss status post SDH evacuation.  Continue ESA and avoiding heparin with dialysis.  # CKD-MBD: Continue calcitriol and sevelamer.  Phosphorus level at goal.  # Nutrition: Continue renal diet with oral nutritional supplements and renal multivitamin.  # Hypertension: Blood pressure under decent control, will continue to monitor with hemodialysis and ongoing antihypertensive therapy.  Subjective: Seen and examined.  No new event.  Working with physical therapist.  Plan for HD today. Objective Vital signs in last 24 hours: Vitals:   10/21/20 0400 10/21/20 0404 10/21/20 0405 10/21/20 0500  BP: (!) 128/100 128/90    Pulse: 75     Resp: 16     Temp: 99.1 F (37.3 C)     TempSrc: Oral     SpO2: 98%     Weight:  98.7 kg 101.3 kg 98.7 kg  Height:       Weight change: -2.5 kg  Intake/Output Summary (Last 24 hours) at 10/21/2020 0835 Last data filed at 10/21/2020 0803 Gross per 24 hour  Intake 712 ml  Output 800 ml  Net -88 ml       Labs: Basic Metabolic Panel: Recent Labs  Lab 10/16/20 0625 10/17/20 0221 10/19/20 1150  NA 134* 133* 137  K 4.1 3.9 4.5  CL 98 97* 103  CO2 23 27 27   GLUCOSE 100* 102* 106*  BUN 63* 37* 37*  CREATININE 9.84* 7.16* 7.46*  CALCIUM 6.7* 7.3* 7.9*  PHOS  --  4.3 4.5   Liver Function Tests: Recent  Labs  Lab 10/17/20 0221 10/19/20 1150  ALBUMIN 2.2* 2.5*   No results for input(s): LIPASE, AMYLASE in the last 168 hours. No results for input(s): AMMONIA in the last 168 hours. CBC: Recent Labs  Lab 10/15/20 0154 10/16/20 0625 10/17/20 0221 10/19/20 1150  WBC 11.3* 8.4 8.4 8.8  HGB 7.9* 7.4* 7.1* 7.5*  HCT 25.3* 23.4* 24.0* 24.8*  MCV 88.2 88.6 91.3 92.2  PLT 220 197 197 198   Cardiac Enzymes: No results for input(s): CKTOTAL, CKMB, CKMBINDEX, TROPONINI in the last 168 hours. CBG: Recent Labs  Lab 10/20/20 0553 10/20/20 1156 10/20/20 1640 10/20/20 2112 10/21/20 0618  GLUCAP 97 97 100* 113* 113*    Iron Studies: No results for input(s): IRON, TIBC, TRANSFERRIN, FERRITIN in the last 72 hours. Studies/Results: No results found.  Medications: Infusions:   Scheduled Medications:  allopurinol  200 mg Oral Daily   amLODipine  5 mg Oral Daily   atorvastatin  40 mg Oral Daily   calcitRIOL  0.5 mcg Oral Q M,W,F-HD   Chlorhexidine Gluconate Cloth  6 each Topical Q0600   darbepoetin (ARANESP) injection - DIALYSIS  60 mcg Intravenous Q Mon-HD   insulin aspart  0-15 Units Subcutaneous TID WC   labetalol  300 mg Oral TID   levETIRAcetam  250 mg Oral BID   losartan  25 mg Oral Daily   mycophenolate  360 mg Oral BID  pantoprazole  40 mg Oral QHS   senna  1 tablet Oral BID   sevelamer carbonate  800 mg Oral TID WC   tacrolimus  2 mg Oral BID    have reviewed scheduled and prn medications.  Physical Exam: General:NAD, comfortable Heart:RRR, s1s2 nl Lungs:clear b/l, no crackle Abdomen:soft, Non-tender, non-distended Extremities:No edema Dialysis Access: Left upper extremity AV fistula has good thrill and bruit.  Atsushi Yom Prasad Jolee Critcher 10/21/2020,8:35 AM  LOS: 4 days

## 2020-10-21 NOTE — Progress Notes (Signed)
PROGRESS NOTE   Subjective/Complaints: Pt lying in bed. Says he's waiting for his 12pm HD session. Denies pain. Asked how close he is to going home  ROS: Patient denies fever, rash, sore throat, blurred vision, nausea, vomiting, diarrhea, cough, shortness of breath or chest pain, joint or back pain, headache, or mood change.   Objective:   No results found. Recent Labs    10/19/20 1150  WBC 8.8  HGB 7.5*  HCT 24.8*  PLT 198   Recent Labs    10/19/20 1150  NA 137  K 4.5  CL 103  CO2 27  GLUCOSE 106*  BUN 37*  CREATININE 7.46*  CALCIUM 7.9*    Intake/Output Summary (Last 24 hours) at 10/21/2020 0919 Last data filed at 10/21/2020 0803 Gross per 24 hour  Intake 712 ml  Output 800 ml  Net -88 ml        Physical Exam: Vital Signs Blood pressure 128/90, pulse 75, temperature 99.1 F (37.3 C), temperature source Oral, resp. rate 16, height 6\' 1"  (1.854 m), weight 98.7 kg, SpO2 98 %. Constitutional: No distress . Vital signs reviewed. HEENT:   EOMI, oral membranes moist Neck: supple Cardiovascular: RRR without murmur. No JVD    Respiratory/Chest: CTA Bilaterally without wheezes or rales. Normal effort    GI/Abdomen: BS +, non-tender, non-distended Ext: no clubbing, cyanosis, or edema Psych: pleasant and cooperative  Musc: No edema in extremities.  No tenderness in extremities. Neuro: Alert and oriented x3. Does lack insight and awareness. Memory fair.  Moves all 4's. Right gaze preference but attends to objects on left. No visual field loss. Musc: Full ROM, No pain with AROM or PROM in the neck, trunk, or extremities. Posture appropriate  Skin: clean, dry; staples in place, incision CDI right scalp  Assessment/Plan: 1. Functional deficits which require 3+ hours per day of interdisciplinary therapy in a comprehensive inpatient rehab setting. Physiatrist is providing close team supervision and 24 hour  management of active medical problems listed below. Physiatrist and rehab team continue to assess barriers to discharge/monitor patient progress toward functional and medical goals  Care Tool:  Bathing    Body parts bathed by patient: Right arm, Left arm, Chest, Abdomen, Front perineal area, Buttocks, Right upper leg, Left upper leg, Right lower leg, Left lower leg, Face   Body parts bathed by helper: Right lower leg, Left lower leg Body parts n/a: Right lower leg, Left lower leg   Bathing assist Assist Level: Minimal Assistance - Patient > 75%     Upper Body Dressing/Undressing Upper body dressing   What is the patient wearing?:  (paper shirt/pants)    Upper body assist Assist Level: Minimal Assistance - Patient > 75%    Lower Body Dressing/Undressing Lower body dressing      What is the patient wearing?:  (paper pants)     Lower body assist Assist for lower body dressing: Minimal Assistance - Patient > 75%     Toileting Toileting Toileting Activity did not occur (Clothing management and hygiene only): N/A (no void or bm)  Toileting assist Assist for toileting: Contact Guard/Touching assist     Transfers Chair/bed transfer  Transfers assist  Chair/bed transfer assist level: Minimal Assistance - Patient > 75%     Locomotion Ambulation   Ambulation assist      Assist level: Minimal Assistance - Patient > 75% Assistive device: No Device Max distance: 116ft   Walk 10 feet activity   Assist     Assist level: Minimal Assistance - Patient > 75% Assistive device: No Device   Walk 50 feet activity   Assist    Assist level: Minimal Assistance - Patient > 75% Assistive device: No Device    Walk 150 feet activity   Assist Walk 150 feet activity did not occur: Safety/medical concerns (fatigue, weakness, decreased balance/postural control)         Walk 10 feet on uneven surface  activity   Assist Walk 10 feet on uneven surfaces activity  did not occur: Safety/medical concerns (fatigue, weakness, decreased balance/postural control)         Wheelchair     Assist Is the patient using a wheelchair?: Yes Type of Wheelchair: Manual    Wheelchair assist level: Supervision/Verbal cueing Max wheelchair distance: 135ft    Wheelchair 50 feet with 2 turns activity    Assist        Assist Level: Supervision/Verbal cueing   Wheelchair 150 feet activity     Assist      Assist Level: Supervision/Verbal cueing   Blood pressure 128/90, pulse 75, temperature 99.1 F (37.3 C), temperature source Oral, resp. rate 16, height 6\' 1"  (1.854 m), weight 98.7 kg, SpO2 98 %.  Medical Problem List and Plan: 1.  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  Drains discontinued 10/16/2020 -Continue CIR therapies including PT, OT, and SLP   2.  Antithrombotics: -DVT/anticoagulation: SCDs.   Vascular study negative             -antiplatelet therapy: N/A 3. Pain Management: Hydrocodone as needed 4. Mood: Provide emotional support             -antipsychotic agents: N/A 5. Neuropsych: This patient is?  Fully capable of making decisions on his own behalf. 6. Skin/Wound Care: 10/14 Remove scalp staples beginning of next week 7. Fluids/Electrolytes/Nutrition: encourage PO 8.  Seizure prophylaxis.  Keppra 250 mg twice daily-- no seizure activity should be able to stop prior to d/c from CIR 9.  End-stage renal disease with decreased donor renal transplantatiion 09/13/2016.  Continue hemodialysis as directed  -HD after therapies to maximize participation during the day  Appreciate nephro recs 10.  Acute on chronic anemia.  Continue Aranesp  Hemoglobin 7.5--mgt per nephrology  Labs with HD 11.  Hypertension.  Norvasc 5 mg daily, labetalol 300 mg 3 times daily, Cozaar 25  mg daily.     Fair control on 10/14 12.  Prediabetes.  Hemoglobin A1c 5.6.  SSI  Controlled on 10/14 13.  History of polysubstance abuse.  Counseling, states he has being using ETOH and tobacco but not other substances 14.?  STD  10/14 UA +/-, UCX pending   -all urine yellow, clear   LOS: 4 days A FACE TO FACE EVALUATION WAS PERFORMED  Meredith Staggers 10/21/2020, 9:19 AM

## 2020-10-21 NOTE — Progress Notes (Signed)
Occupational Therapy TBI Note  Patient Details  Name: Christopher Chapman MRN: [id number] Date of Birth: 21-Jun-1955  Today's Date: 10/21/2020 OT Individual Time: 0700-0802 OT Individual Time Calculation (min): 62 min    Short Term Goals: Week 1:  OT Short Term Goal 1 (Week 1): STG = LTG due to estimated LOS  Skilled Therapeutic Interventions/Progress Updates:    Pt received in bed eating breakfast with no c/o pain and agreeable to OT session. Pt hyperverbal throughout tx, easily distracted and required frequent redirection to complete all parts of task. Reported he did not like being cold. Pt impulsive throughout tx, requiring cues for safety awareness.  ADL: Skilled interventions include: Pt insistent that the only thing wrong with him is his memory with decreased awareness for balance deficits. Pt dons socks with MIN cuing for attention to task. Completes functional mobility to sit in wc with no AD and CGA. Perseverates while brushing teeth - brushes teeth twice without realizing it and OTS redirects from third attempt. Pt with short term memory difficulties, forgetting that he applied deodorant. Pt with LOB to L in standing to pull pants over hips - ignored LOB, and did not acknowledge when OTS corrected and attempted to orient pt to deficits. Pt able to complete peri-care in standing with MOD cuing redirection Pt requests to use bathroom, and voided/BM (charted in flowsheets). Completes tolieting tasks with close (S).    Therapeutic activity Pt worked on dual task challenges while walking forwards and backwards tossing a ball naming animals. Pt named animals walking forward but with increased difficulty when OT graded task to include naming animals in alphabetical order. Pt required MOD cuing for redirection in hallway with external distractions. Pt demos moderately impaired dual task processing same task walking backwards as evidenced by needing to stop to think of animal name and recall  where he was in alphabet. Steps walking backwards were small and pt required significant encouragement to pick up the pace. Pt adamant nothing is wrong with him, and he couldn't do this before. Pt returned to room pushed in wc. Pt left at end of session in bed with exit alarm on, call light in reach and all needs met   Therapy Documentation Precautions:  Precautions Precautions: Fall Restrictions Weight Bearing Restrictions: No    Agitated Behavior Scale: TBI Observation Details Observation Environment: pt's room Start of observation period - Date: 10/21/20 Start of observation period - Time: 0700 End of observation period - Date: 10/21/20 End of observation period - Time: 0802 Agitated Behavior Scale (DO NOT LEAVE BLANKS) Short attention span, easy distractibility, inability to concentrate: Present to a moderate degree Impulsive, impatient, low tolerance for pain or frustration: Present to a moderate degree Uncooperative, resistant to care, demanding: Absent Violent and/or threatening violence toward people or property: Absent Explosive and/or unpredictable anger: Absent Rocking, rubbing, moaning, or other self-stimulating behavior: Absent Pulling at tubes, restraints, etc.: Absent Wandering from treatment areas: Absent Restlessness, pacing, excessive movement: Absent Repetitive behaviors, motor, and/or verbal: Present to a moderate degree Rapid, loud, or excessive talking: Present to a moderate degree Sudden changes of mood: Present to a slight degree Easily initiated or excessive crying and/or laughter: Absent Self-abusiveness, physical and/or verbal: Absent Agitated behavior scale total score: 23    Therapy/Group: Individual Therapy  Mackena Plummer 10/21/2020, 6:34 AM

## 2020-10-21 NOTE — Progress Notes (Signed)
Speech Language Pathology TBI Note  Patient Details  Name: Christopher Chapman MRN: [id number] Date of Birth: 08-04-1955  Today's Date: 10/21/2020 SLP Individual Time: 7989-2119 SLP Individual Time Calculation (min): 45 min  Short Term Goals: Week 1: SLP Short Term Goal 1 (Week 1): STG=LTG due to short ELOS (7-10 days)  Skilled Therapeutic Interventions: Skilled treatment session focused on cognitive goals. SLP facilitated session by providing Mod-Max A verbal cues for patient to recall events from previous therapy session. SLP provided education regarding importance of establishing a routine and use of visual reminders to maximize recall of medications. Patient would verbalize understanding but then would contradict himself within the same sentence indicative of poor awareness of deficits. Patient organized a 3X/week pill box with Min verbal cues for error awareness. Mod verbal cues were also needed for sustained attention to task due to verbosity and poor topic maintenance. Patient left upright in bed with alarm on and all needs within reach. Continue with current plan of care.      Pain 5/10 headache RN aware and administered medications   Agitated Behavior Scale: TBI Observation Details Observation Environment: Patient's room Start of observation period - Date: 10/21/20 Start of observation period - Time: 0900 End of observation period - Date: 10/21/20 End of observation period - Time: 0945 Agitated Behavior Scale (DO NOT LEAVE BLANKS) Short attention span, easy distractibility, inability to concentrate: Present to a moderate degree Impulsive, impatient, low tolerance for pain or frustration: Present to a slight degree Uncooperative, resistant to care, demanding: Present to a slight degree Violent and/or threatening violence toward people or property: Absent Explosive and/or unpredictable anger: Absent Rocking, rubbing, moaning, or other self-stimulating behavior: Absent Pulling at  tubes, restraints, etc.: Absent Wandering from treatment areas: Absent Restlessness, pacing, excessive movement: Absent Repetitive behaviors, motor, and/or verbal: Present to a slight degree Rapid, loud, or excessive talking: Present to a slight degree Sudden changes of mood: Absent Easily initiated or excessive crying and/or laughter: Absent Self-abusiveness, physical and/or verbal: Absent Agitated behavior scale total score: 20  Therapy/Group: Individual Therapy  Patrcia Schnepp 10/21/2020, 2:24 PM

## 2020-10-21 NOTE — Progress Notes (Signed)
Patient ID: Christopher Chapman, male   DOB: [birth year], 65 y.o.   MRN: [id number]  SW met with pt in room to provide updates on [facility] being pre-certified, waiting to hear from Medicaid CM to set up MCD transportation, and changes to his insurance beginning on 11/1. SW informed pt will f/u once there is more information from his MCD CM and d/c date.   Loralee Pacas, MSW, [place] Office: [phone] Cell: [phone] Fax: [phone]

## 2020-10-22 LAB — GLUCOSE, CAPILLARY
Glucose-Capillary: 100 mg/dL — ABNORMAL HIGH (ref 70–99)
Glucose-Capillary: 98 mg/dL (ref 70–99)
Glucose-Capillary: 121 mg/dL — ABNORMAL HIGH (ref 70–99)
Glucose-Capillary: 100 mg/dL — ABNORMAL HIGH (ref 70–99)

## 2020-10-22 NOTE — Progress Notes (Signed)
Physical Therapy TBI Note  Patient Details  Name: Christopher Chapman MRN: [id number] Date of Birth: Dec 28, 1955  Today's Date: 10/22/2020 PT Individual Time: 1300-1400 PT Individual Time Calculation (min): 60 min   Short Term Goals: Week 1:  PT Short Term Goal 1 (Week 1): STG=LTG due to LOS  Skilled Therapeutic Interventions/Progress Updates:  Pt resting in bed.  He denied pain.  Supine> sit with supervision. .  Sit> stand to wc on L with close supervision.  Dual task of standing with forefeet on blue wedge for sustained stretch L heel cords and challenge balance while manipulating playing cards and matching onto vertical board in front of him.  Pt accurate 8/8 cards L hand; he sat suddenly after matching 8 cards.  He was unable to explain why, except fatigue. 2nd bout, using R hand, pt able to complete 9/9 cards in standing as above.  Use of KInetron for endurance training.  In sitting at resistance 40 cm/sec x 1 minute, in standing, with bil UE support> 0UE support x 30 seconds before sitting down. Pt stated that he "felt unsafe"; PT explained purpose of challenging pt for strength and balance; he voiced understanding.   Gait training using wc as AD, x 150' without LOB , with varying speed, close supervision. Gait training without AD, kicking Yoga block with either foot, x 250' without LOB, CGA.    Advanced gait training while transporting folded RW in R hand, x 250' with CGA, no LOB.    At end of session, pt requested getting back to bed.  Sit> supine with supervision.  Bed alarm set and needs left at hand.        Therapy Documentation Precautions:  Precautions Precautions: Fall Restrictions Weight Bearing Restrictions: No    Agitated Behavior Scale: TBI   Agitated Behavior Scale (DO NOT LEAVE BLANKS) Short attention span, easy distractibility, inability to concentrate: Present to a moderate degree Impulsive, impatient, low tolerance for pain or frustration: Present to a slight  degree Uncooperative, resistant to care, demanding: Absent Violent and/or threatening violence toward people or property: Absent Explosive and/or unpredictable anger: Absent Rocking, rubbing, moaning, or other self-stimulating behavior: Absent Pulling at tubes, restraints, etc.: Absent Wandering from treatment areas: Absent Restlessness, pacing, excessive movement: Absent Repetitive behaviors, motor, and/or verbal: Present to a slight degree Rapid, loud, or excessive talking: Present to a moderate degree Sudden changes of mood: Absent Easily initiated or excessive crying and/or laughter: Absent Self-abusiveness, physical and/or verbal: Absent Agitated behavior scale total score: 20       Therapy/Group: Individual Therapy  Mila Pair 10/22/2020, 5:33 PM

## 2020-10-22 NOTE — Progress Notes (Signed)
Speech Language Pathology TBI Note  Patient Details  Name: Christopher Chapman MRN: [id number] Date of Birth: 02/26/1955  Today's Date: 10/22/2020 SLP Individual Time: 1010-1050 SLP Individual Time Calculation (min): 40 min  Short Term Goals: Week 1: SLP Short Term Goal 1 (Week 1): STG=LTG due to short ELOS (7-10 days)  Skilled Therapeutic Interventions: Skilled treatment session focused on cognitive goals. Upon arrival, patient required Min verbal cues to recall events from previous therapy sessions. Patient was sitting in the dark and required education regarding sleep/wake cycle. Patient reported that all of his family are "night owls" and sleep most of the day. SLP began questioning patient on who would provide the supervision at home at discharge. Patient reported no one would be able to assist and he required education regarding why therapy is recommending 24 hour supervision. Patient continues to demonstrate poor awareness of deficits and minimizes recent medical events. Throughout this conversation, patient would intermittently interject with statements not related to the topic like needing to go to the dentist or requesting coffee. Patient left upright in bed with alarm on and all needs within reach. Continue with current plan of care.      Pain Pain Assessment Pain Scale: 0-10 Pain Score: 0-No pain  Agitated Behavior Scale: TBI Observation Details Observation Environment: Patient's room Start of observation period - Date: 10/22/20 Start of observation period - Time: 1010 End of observation period - Date: 10/22/20 End of observation period - Time: 1050 Agitated Behavior Scale (DO NOT LEAVE BLANKS) Short attention span, easy distractibility, inability to concentrate: Present to a moderate degree Impulsive, impatient, low tolerance for pain or frustration: Present to a slight degree Uncooperative, resistant to care, demanding: Absent Violent and/or threatening violence toward  people or property: Absent Explosive and/or unpredictable anger: Absent Rocking, rubbing, moaning, or other self-stimulating behavior: Absent Pulling at tubes, restraints, etc.: Absent Wandering from treatment areas: Absent Restlessness, pacing, excessive movement: Absent Repetitive behaviors, motor, and/or verbal: Present to a slight degree Rapid, loud, or excessive talking: Present to a slight degree Sudden changes of mood: Absent Easily initiated or excessive crying and/or laughter: Absent Self-abusiveness, physical and/or verbal: Absent Agitated behavior scale total score: 19  Therapy/Group: Individual Therapy  Adaiah Jaskot 10/22/2020, 11:42 AM

## 2020-10-22 NOTE — Progress Notes (Signed)
[facility] NEPHROLOGY PROGRESS NOTE  Assessment/ Plan:  # Chronic subdural hematoma: Status post evacuation originally on 9/28 with repeat evacuation surgery performed 10/7.  Currently at the inpatient rehabilitation unit for intensive PT/OT for management of functional deficits.  #. 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  On Prograf and Myfortic.  Receiving HD MWF schedule, status post HD yesterday with 1.5 L UF, tolerated well.  Plan for next HD on 10/17.  The outpatient HD arranged at [facility] (first shift) upon discharge.  # Anemia: Low hemoglobin and hematocrit without overt blood loss status post SDH evacuation.  Continue ESA and avoiding heparin with dialysis.  # CKD-MBD: Continue calcitriol and sevelamer.  Phosphorus level at goal.  # Nutrition: Continue renal diet with oral nutritional supplements and renal multivitamin.  # Hypertension: Blood pressure under decent control, will continue to monitor with hemodialysis and ongoing antihypertensive therapy.  Subjective: Seen and examined.  Tolerated dialysis well.  No new event.  Working with physical therapy in rehab.  Objective Vital signs in last 24 hours: Vitals:   10/21/20 2010 10/21/20 2038 10/22/20 0519 10/22/20 0529  BP: 138/81 (!) 146/73 122/69   Pulse:  64 78   Resp: 14 14 14    Temp: 98.9 F (37.2 C) 98.7 F (37.1 C) 98 F (36.7 C)   TempSrc:  Oral    SpO2: 100% 100% 100%   Weight: 98.2 kg   66.1 kg  Height:       Weight change: 0.3 kg  Intake/Output Summary (Last 24 hours) at 10/22/2020 0851 Last data filed at 10/21/2020 2010 Gross per 24 hour  Intake 236 ml  Output 1500 ml  Net -1264 ml        Labs: Basic Metabolic Panel: Recent Labs  Lab 10/16/20 0625 10/17/20 0221 10/19/20 1150  NA 134* 133* 137  K 4.1 3.9 4.5  CL 98 97* 103  CO2 23 27 27   GLUCOSE 100* 102* 106*   BUN 63* 37* 37*  CREATININE 9.84* 7.16* 7.46*  CALCIUM 6.7* 7.3* 7.9*  PHOS  --  4.3 4.5    Liver Function Tests: Recent Labs  Lab 10/17/20 0221 10/19/20 1150  ALBUMIN 2.2* 2.5*    No results for input(s): LIPASE, AMYLASE in the last 168 hours. No results for input(s): AMMONIA in the last 168 hours. CBC: Recent Labs  Lab 10/16/20 0625 10/17/20 0221 10/19/20 1150  WBC 8.4 8.4 8.8  HGB 7.4* 7.1* 7.5*  HCT 23.4* 24.0* 24.8*  MCV 88.6 91.3 92.2  PLT 197 197 198    Cardiac Enzymes: No results for input(s): CKTOTAL, CKMB, CKMBINDEX, TROPONINI in the last 168 hours. CBG: Recent Labs  Lab 10/21/20 0618 10/21/20 1141 10/21/20 1618 10/21/20 2117 10/22/20 0601  GLUCAP 113* 99 98 131* 100*     Iron Studies: No results for input(s): IRON, TIBC, TRANSFERRIN, FERRITIN in the last 72 hours. Studies/Results: No results found.  Medications: Infusions:   Scheduled Medications:  allopurinol  200 mg Oral Daily   amLODipine  5 mg Oral Daily   atorvastatin  40 mg Oral Daily   calcitRIOL  0.5 mcg Oral Q M,W,F-HD   Chlorhexidine Gluconate Cloth  6 each Topical Q0600   [START ON 10/24/2020] darbepoetin (ARANESP) injection - DIALYSIS  60 mcg Intravenous Q Mon-HD   insulin aspart  0-15 Units Subcutaneous TID WC   labetalol  300 mg Oral TID   levETIRAcetam  250 mg Oral BID   losartan  25 mg Oral Daily   mycophenolate  360 mg Oral BID   pantoprazole  40 mg Oral QHS   senna  1 tablet Oral BID   sevelamer carbonate  800 mg Oral TID WC   tacrolimus  2 mg Oral BID    have reviewed scheduled and prn medications.  Physical Exam: General:NAD, pleasant male lying on bed comfortable Heart:RRR, s1s2 nl Lungs:clear b/l, no crackle Abdomen:soft, Non-tender, non-distended Extremities:No edema Dialysis Access: Left upper extremity AV fistula has good thrill and bruit.  Dael Howland Prasad Arlissa Monteverde 10/22/2020,8:51 AM  LOS: 5 days

## 2020-10-22 NOTE — Progress Notes (Signed)
Occupational Therapy TBI Note  Patient Details  Name: CUTLER SUNDAY MRN: [id number] Date of Birth: 1955/05/19  Today's Date: 10/22/2020 OT Individual Time: 0800-0900 OT Individual Time Calculation (min): 60 min   Today's Date: 10/22/2020 OT Individual Time: 1530-1557 OT Individual Time Calculation (min): 27 min   Short Term Goals: Week 1:  OT Short Term Goal 1 (Week 1): STG = LTG due to estimated LOS  Skilled Therapeutic Interventions/Progress Updates:     Pt received in bed but no no pain reported.  ADL: Pt completes ADL at overall supervision Level. Skilled interventions include: giving pt a time limit to complete shower, dressing and grooming at sink with 3 cues for pacing due to verbosity in shower. Pt unaware of how much soap left on his body and demo poor noticing/responding skill as pt just sits and waits for the water to warm up without adjusting the water despite 1 instructional cue. Pt demo poor topic maintenance when in shower often tangential about nephew and the silver sneakers program. Pt with 1 episode of dizziness and vitals assessed/WNL. Pt demo increased fatigue after shower requiring time for a rest break. Pt completes oral care 2x at sink and reports this is a routine of his.   Therapeutic activity Pt completes alternating attention task completing 1 step of each task before moving onto the next with matching cards to vertical board (2 errors-mod cuing to acknowledge), memory of spelling name 1 letter at a time (error losing track after rest break where in name 1x) and graded pipe tree (pt with 3 errors attempting to place incorrect pipes despite figure requiring direct cuing to look back at picture/figure to find where he was in building process). Pt demo 2 LOB (1 anterior, 1 laterally) with CGA. Pt continues to make excuses and deny any errors or balance issues  Pt left at end of session in bed with exit alarm on, call light in reach and all needs met  Session  2:  Pt received in bed with 0 out of 10 pain    Therapeutic activity Pt completes kitchen safety assessment searching/remedying for 5 unsafe situations in kitchen may encounter (knife on edge of counter, cup on floor, water running in sink, cabinet door open, and burner left on).   Pt able to identify 3 /5 situations with no cuing. MOD questions cues to notice if pt smelled anything (hot burner) and what pt could hit his head on (cabinet door). Activity completed for safety awareness and kitchen navigation. Pt education on reason each scenario is unsafe/risks.   Pt completes functional moibliyt in hallway with increased lateral sway to R 1 instance with no LOB, then negotiates getting up/down from couch, recliner, car ramp, over mulch and up/down curb.  Pt left at end of session in bed with exit alarm on, call light in reach and all needs met   Therapy Documentation Precautions:  Precautions Precautions: Fall Restrictions Weight Bearing Restrictions: No General:      Agitated Behavior Scale: TBI  Observation Details Observation Environment: CIR Start of observation period - Date: 10/22/20 Start of observation period - Time: 0800 End of observation period - Date: 10/22/20 End of observation period - Time: 0900 Agitated Behavior Scale (DO NOT LEAVE BLANKS) Short attention span, easy distractibility, inability to concentrate: Present to a moderate degree Impulsive, impatient, low tolerance for pain or frustration: Present to a moderate degree Uncooperative, resistant to care, demanding: Absent Violent and/or threatening violence toward people or property: Absent Explosive and/or  unpredictable anger: Absent Rocking, rubbing, moaning, or other self-stimulating behavior: Absent Pulling at tubes, restraints, etc.: Absent Wandering from treatment areas: Absent Restlessness, pacing, excessive movement: Absent Repetitive behaviors, motor, and/or verbal: Present to a slight  degree Rapid, loud, or excessive talking: Present to a slight degree Sudden changes of mood: Absent Easily initiated or excessive crying and/or laughter: Absent Self-abusiveness, physical and/or verbal: Absent Agitated behavior scale total score: 20   Therapy/Group: Individual Therapy  Tonny Branch 10/22/2020, 12:28 PM

## 2020-10-23 LAB — GLUCOSE, CAPILLARY
Glucose-Capillary: 99 mg/dL (ref 70–99)
Glucose-Capillary: 89 mg/dL (ref 70–99)
Glucose-Capillary: 112 mg/dL — ABNORMAL HIGH (ref 70–99)
Glucose-Capillary: 97 mg/dL (ref 70–99)

## 2020-10-23 MED ORDER — CHLORHEXIDINE GLUCONATE CLOTH 2 % EX PADS
6.0000 | MEDICATED_PAD | Freq: Every day | CUTANEOUS | Status: DC
  Administered 2020-10-24 – 2020-10-26 (×3): 6 via TOPICAL

## 2020-10-23 NOTE — Progress Notes (Signed)
PROGRESS NOTE   Subjective/Complaints: Patient seen laying in bed this morning.  He states he slept well overnight.  He does not recall me from earlier this week initially.  He denies complaints.  He was seen by nephrology yesterday, notes reviewed-no changes.  ROS: Denies CP, SOB, N/V/D  Objective:   No results found. No results for input(s): WBC, HGB, HCT, PLT in the last 72 hours.  No results for input(s): NA, K, CL, CO2, GLUCOSE, BUN, CREATININE, CALCIUM in the last 72 hours.   Intake/Output Summary (Last 24 hours) at 10/23/2020 0724 Last data filed at 10/23/2020 0529 Gross per 24 hour  Intake 360 ml  Output 600 ml  Net -240 ml         Physical Exam: Vital Signs Blood pressure 139/70, pulse 70, temperature 98 F (36.7 C), resp. rate 18, height 6\' 1"  (1.854 m), weight 66.1 kg, SpO2 97 %. Constitutional: No distress . Vital signs reviewed. HENT: Normocephalic.  Right scalp incision Eyes: EOMI. No discharge. Cardiovascular: No JVD.  RRR. Respiratory: Normal effort.  No stridor.  Bilateral clear to auscultation. GI: Non-distended.  BS +. Skin: Warm and dry.  Incision CDI Psych: Normal mood.  Normal behavior. Musc: No edema in extremities.  No tenderness in extremities. Neuro: Alert Does lack insight and awareness.  Motor: Grossly intact  Assessment/Plan: 1. Functional deficits which require 3+ hours per day of interdisciplinary therapy in a comprehensive inpatient rehab setting. Physiatrist is providing close team supervision and 24 hour management of active medical problems listed below. Physiatrist and rehab team continue to assess barriers to discharge/monitor patient progress toward functional and medical goals  Care Tool:  Bathing    Body parts bathed by patient: Right arm, Right lower leg, Left lower leg, Left arm, Chest, Abdomen, Face, Front perineal area, Buttocks, Right upper leg, Left upper leg    Body parts bathed by helper: Right lower leg, Left lower leg Body parts n/a: Right lower leg, Left lower leg   Bathing assist Assist Level: Set up assist     Upper Body Dressing/Undressing Upper body dressing   What is the patient wearing?: Pull over shirt    Upper body assist Assist Level: Set up assist    Lower Body Dressing/Undressing Lower body dressing      What is the patient wearing?: Pants     Lower body assist Assist for lower body dressing: Minimal Assistance - Patient > 75%     Toileting Toileting Toileting Activity did not occur (Clothing management and hygiene only): N/A (no void or bm)  Toileting assist Assist for toileting: Supervision/Verbal cueing     Transfers Chair/bed transfer  Transfers assist     Chair/bed transfer assist level: Supervision/Verbal cueing     Locomotion Ambulation   Ambulation assist      Assist level: Supervision/Verbal cueing Assistive device: Other (comment) (wc as AD) Max distance: 150   Walk 10 feet activity   Assist     Assist level: Supervision/Verbal cueing Assistive device: Other (comment)   Walk 50 feet activity   Assist    Assist level: Supervision/Verbal cueing Assistive device: Other (comment)    Walk 150 feet activity  Assist Walk 150 feet activity did not occur: Safety/medical concerns (fatigue, weakness, decreased balance/postural control)  Assist level: Supervision/Verbal cueing Assistive device: Other (comment)    Walk 10 feet on uneven surface  activity   Assist Walk 10 feet on uneven surfaces activity did not occur: Safety/medical concerns (fatigue, weakness, decreased balance/postural control)         Wheelchair     Assist Is the patient using a wheelchair?: Yes Type of Wheelchair: Manual    Wheelchair assist level: Supervision/Verbal cueing Max wheelchair distance: 157ft    Wheelchair 50 feet with 2 turns activity    Assist        Assist Level:  Supervision/Verbal cueing   Wheelchair 150 feet activity     Assist      Assist Level: Supervision/Verbal cueing   Blood pressure 139/70, pulse 70, temperature 98 F (36.7 C), resp. rate 18, height 6\' 1"  (1.854 m), weight 66.1 kg, SpO2 97 %.  Medical Problem List and Plan: 1.  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  Drains discontinued 10/16/2020 Continue CIR 2.  Antithrombotics: -DVT/anticoagulation: SCDs.   Vascular study negative             -antiplatelet therapy: N/A 3. Pain Management: Hydrocodone as needed 4. Mood: Provide emotional support             -antipsychotic agents: N/A 5. Neuropsych: This patient is?  Fully capable of making decisions on his own behalf. 6. Skin/Wound Care: 10/14 Remove scalp staples beginning of next week 7. Fluids/Electrolytes/Nutrition: encourage PO 8.  Seizure prophylaxis.  Keppra 250 mg twice daily-- no seizure activity should be able to stop prior to d/c from CIR 9.  End-stage renal disease with decreased donor renal transplantatiion 09/13/2016.  Continue hemodialysis as directed  -HD after therapies to maximize participation during the day  Appreciate nephro recs 10.  Acute on chronic anemia.  Continue Aranesp  Hemoglobin 7.5 on 10/12, labs with HD  Labs with HD 11.  Hypertension.  Norvasc 5 mg daily, labetalol 300 mg 3 times daily, Cozaar 25 mg daily.     Controlled on 10/16 12.  Prediabetes.  Hemoglobin A1c 5.6.  SSI  Controlled on 10/16 13.  History of polysubstance abuse.  Counseling, states he has being using ETOH and tobacco but not other substances 14.?  STD  10/14 UA +/-, UCX with insignificant growth   -all urine yellow, clear   LOS: 6 days A FACE TO FACE EVALUATION WAS PERFORMED  Victoire Deans Lorie Phenix 10/23/2020, 7:24 AM

## 2020-10-23 NOTE — Progress Notes (Signed)
[facility] NEPHROLOGY PROGRESS NOTE  Assessment/ Plan:  # Chronic subdural hematoma: Status post evacuation originally on 9/28 with repeat evacuation surgery performed 10/7.  Currently at the inpatient rehabilitation unit for intensive PT/OT for management of functional deficits.  #. 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  On Prograf and Myfortic.  Receiving HD MWF schedule, status post HD on 10/14 with 1.5 L UF, tolerated well.  Plan for next HD on 10/17.  The outpatient HD arranged at [facility] (first shift) upon discharge.  # Anemia: Low hemoglobin and hematocrit without overt blood loss status post SDH evacuation.  Continue ESA and avoiding heparin with dialysis.  # CKD-MBD: Continue calcitriol and sevelamer.  Phosphorus level at goal.  # Nutrition: Continue renal diet with oral nutritional supplements and renal multivitamin.  # Hypertension: Blood pressure under decent control, will continue to monitor with hemodialysis and ongoing antihypertensive therapy.  Subjective: Seen and examined. No new event. No CP/SOB.   Objective Vital signs in last 24 hours: Vitals:   10/22/20 0529 10/22/20 1437 10/22/20 1948 10/23/20 0529  BP:  (!) 118/59 126/76 139/70  Pulse:  70 75 70  Resp:  18 19 18   Temp:  98.4 F (36.9 C) 98.5 F (36.9 C) 98 F (36.7 C)  TempSrc:  Oral Oral   SpO2:  100% 100% 97%  Weight: 66.1 kg     Height:       Weight change:   Intake/Output Summary (Last 24 hours) at 10/23/2020 0916 Last data filed at 10/23/2020 0731 Gross per 24 hour  Intake 600 ml  Output 600 ml  Net 0 ml        Labs: Basic Metabolic Panel: Recent Labs  Lab 10/17/20 0221 10/19/20 1150  NA 133* 137  K 3.9 4.5  CL 97* 103  CO2 27 27  GLUCOSE 102* 106*  BUN 37* 37*  CREATININE 7.16* 7.46*  CALCIUM 7.3* 7.9*  PHOS 4.3 4.5    Liver Function  Tests: Recent Labs  Lab 10/17/20 0221 10/19/20 1150  ALBUMIN 2.2* 2.5*    No results for input(s): LIPASE, AMYLASE in the last 168 hours. No results for input(s): AMMONIA in the last 168 hours. CBC: Recent Labs  Lab 10/17/20 0221 10/19/20 1150  WBC 8.4 8.8  HGB 7.1* 7.5*  HCT 24.0* 24.8*  MCV 91.3 92.2  PLT 197 198    Cardiac Enzymes: No results for input(s): CKTOTAL, CKMB, CKMBINDEX, TROPONINI in the last 168 hours. CBG: Recent Labs  Lab 10/22/20 0601 10/22/20 1121 10/22/20 1644 10/22/20 2055 10/23/20 0600  GLUCAP 100* 98 121* 100* 99     Iron Studies: No results for input(s): IRON, TIBC, TRANSFERRIN, FERRITIN in the last 72 hours. Studies/Results: No results found.  Medications: Infusions:   Scheduled Medications:  allopurinol  200 mg Oral Daily   amLODipine  5 mg Oral Daily   atorvastatin  40 mg Oral Daily   calcitRIOL  0.5 mcg Oral Q M,W,F-HD   Chlorhexidine Gluconate Cloth  6 each Topical Q0600   [START ON 10/24/2020] darbepoetin (ARANESP) injection - DIALYSIS  60 mcg Intravenous Q Mon-HD   insulin aspart  0-15 Units Subcutaneous TID WC   labetalol  300 mg Oral TID   levETIRAcetam  250 mg Oral BID   losartan  25 mg Oral Daily   mycophenolate  360 mg Oral BID   pantoprazole  40 mg Oral QHS  senna  1 tablet Oral BID   sevelamer carbonate  800 mg Oral TID WC   tacrolimus  2 mg Oral BID    have reviewed scheduled and prn medications.  Physical Exam: General:NAD, pleasant male lying on bed comfortable Heart:RRR, s1s2 nl Lungs:clear b/l, no crackle Abdomen:soft, Non-tender, non-distended Extremities:No edema Dialysis Access: Left upper extremity AV fistula has good thrill and bruit.  Marceline Napierala Prasad Raseel Jans 10/23/2020,9:16 AM  LOS: 6 days

## 2020-10-24 LAB — GLUCOSE, CAPILLARY
Glucose-Capillary: 92 mg/dL (ref 70–99)
Glucose-Capillary: 110 mg/dL — ABNORMAL HIGH (ref 70–99)
Glucose-Capillary: 121 mg/dL — ABNORMAL HIGH (ref 70–99)
Glucose-Capillary: 107 mg/dL — ABNORMAL HIGH (ref 70–99)

## 2020-10-24 NOTE — Progress Notes (Signed)
Occupational Therapy TBI Note  Patient Details  Name: Christopher Chapman MRN: [id number] Date of Birth: [birth year]  Today's Date: 10/24/2020 OT Individual Time: 3374-4514 OT Individual Time Calculation (min): 49 min    Short Term Goals: Week 1:  OT Short Term Goal 1 (Week 1): STG = LTG due to estimated LOS  Skilled Therapeutic Interventions/Progress Updates:  Patient met lying supine in bed in agreement with OT treatment session. 0/10 pain reported at rest and with activity. Patient declines ADLs. Focus of session on functional cognition and community reintegration. Patient given verbal directions for outdoor area near hospital atrium. Unable to immediately recall directions but could correctly recall after 2nd trial. Patient ambulated from hospital room to and from hospital atrium with supervision A and Min cues for directional recall and to utilize environmental cues. Patient loquacious, easily distracted and with decreased memory. Poor insight with patient stating several times that he has a "big brain" and that "nothing is wrong" with him. Therapist attempted to explain effects of SDH but patient believes that he has no residual deficits. Two player word game with instruction for patient/therapist to alternate stating words beginning with a specific letter (selected randomly). Patient unable to recall more than one animal that begins with the letter "T" or more than one food that begins with the letter "E". Patient acknowledges deficits in memory but does not believe they are directly related to SDH. Patient also repeating himself several times throughout treatment session. Session concluded with patient lying supine in bed with call bell within reach, bed alarm activated and all needs met.   Therapy Documentation Precautions:  Precautions Precautions: Fall Restrictions Weight Bearing Restrictions: No General:   Vital Signs:  Pain:   Agitated Behavior Scale: TBI Observation  Details Observation Environment: hospital atrium Start of observation period - Date: 10/24/20 Start of observation period - Time: 0901 End of observation period - Date: 10/24/20 End of observation period - Time: 0954 Agitated Behavior Scale (DO NOT LEAVE BLANKS) Short attention span, easy distractibility, inability to concentrate: Present to a moderate degree Impulsive, impatient, low tolerance for pain or frustration: Present to a slight degree Uncooperative, resistant to care, demanding: Absent Violent and/or threatening violence toward people or property: Absent Explosive and/or unpredictable anger: Absent Rocking, rubbing, moaning, or other self-stimulating behavior: Absent Pulling at tubes, restraints, etc.: Absent Wandering from treatment areas: Absent Restlessness, pacing, excessive movement: Absent Repetitive behaviors, motor, and/or verbal: Present to a slight degree Rapid, loud, or excessive talking: Present to a moderate degree Sudden changes of mood: Absent Easily initiated or excessive crying and/or laughter: Absent Self-abusiveness, physical and/or verbal: Absent Agitated behavior scale total score: 20  Therapy/Group: Individual Therapy  Adreanna Fickel R Howerton-Davis 10/24/2020, 12:32 PM

## 2020-10-24 NOTE — Progress Notes (Signed)
[facility] NEPHROLOGY PROGRESS NOTE  Assessment/ Plan:  # Chronic subdural hematoma: Status post evacuation originally on 9/28 with repeat evacuation surgery performed 10/7.  Currently at the inpatient rehabilitation unit for intensive PT/OT for management of functional deficits.  #. 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  On Prograf. Stop MMF today 10/17.  Receiving HD MWF schedule, status post HD on 10/14 with 1.5 L UF, tolerated well.  Plan for next HD on 10/17.  The outpatient HD arranged at [facility] (first shift) upon discharge.  # Anemia: Low hemoglobin and hematocrit without overt blood loss status post SDH evacuation.  Continue darbe 60 qMon and avoiding heparin with dialysis.  # CKD-MBD: Continue calcitriol and sevelamer.  Phosphorus level at goal.  # Nutrition: Continue renal diet with oral nutritional supplements and renal multivitamin.  # Hypertension: Blood pressure under decent control, will continue to monitor with hemodialysis and ongoing antihypertensive therapy.  Subjective: Seen and examined. No new event. No CP/SOB. For HD today.   Objective Vital signs in last 24 hours: Vitals:   10/23/20 2103 10/23/20 2106 10/24/20 0500 10/24/20 0522  BP: (!) 150/78 (!) 150/78  (!) 142/71  Pulse: 74 77  70  Resp:  16  16  Temp:  98.9 F (37.2 C)  98.6 F (37 C)  TempSrc:  Oral  Oral  SpO2:  100%  100%  Weight:   98.2 kg   Height:       Weight change:   Intake/Output Summary (Last 24 hours) at 10/24/2020 1240 Last data filed at 10/24/2020 0700 Gross per 24 hour  Intake 840 ml  Output --  Net 840 ml        Labs: Basic Metabolic Panel: Recent Labs  Lab 10/19/20 1150  NA 137  K 4.5  CL 103  CO2 27  GLUCOSE 106*  BUN 37*  CREATININE 7.46*  CALCIUM 7.9*  PHOS 4.5    Liver Function Tests: Recent Labs  Lab 10/19/20 1150   ALBUMIN 2.5*    No results for input(s): LIPASE, AMYLASE in the last 168 hours. No results for input(s): AMMONIA in the last 168 hours. CBC: Recent Labs  Lab 10/19/20 1150  WBC 8.8  HGB 7.5*  HCT 24.8*  MCV 92.2  PLT 198    Cardiac Enzymes: No results for input(s): CKTOTAL, CKMB, CKMBINDEX, TROPONINI in the last 168 hours. CBG: Recent Labs  Lab 10/23/20 1145 10/23/20 1632 10/23/20 2108 10/24/20 0614 10/24/20 1153  GLUCAP 89 112* 97 92 110*     Iron Studies: No results for input(s): IRON, TIBC, TRANSFERRIN, FERRITIN in the last 72 hours. Studies/Results: No results found.  Medications: Infusions:   Scheduled Medications:  allopurinol  200 mg Oral Daily   amLODipine  5 mg Oral Daily   atorvastatin  40 mg Oral Daily   calcitRIOL  0.5 mcg Oral Q M,W,F-HD   Chlorhexidine Gluconate Cloth  6 each Topical Q0600   darbepoetin (ARANESP) injection - DIALYSIS  60 mcg Intravenous Q Mon-HD   insulin aspart  0-15 Units Subcutaneous TID WC   labetalol  300 mg Oral TID   levETIRAcetam  250 mg Oral BID   losartan  25 mg Oral Daily   mycophenolate  360 mg Oral BID   pantoprazole  40 mg Oral QHS   senna  1 tablet Oral BID   sevelamer carbonate  800 mg Oral TID WC   tacrolimus  2 mg Oral BID    have reviewed scheduled and prn medications.  Physical Exam: General:NAD, pleasant male lying on bed comfortable Heart:RRR, s1s2 nl Lungs:clear b/l, no crackle Abdomen:soft, Non-tender, non-distended Extremities:No edema Dialysis Access: Left upper extremity AV fistula has good thrill and bruit.  Donovyn Guidice B Jacolby Risby 10/24/2020,12:40 PM  LOS: 7 days

## 2020-10-24 NOTE — Progress Notes (Signed)
Occupational Therapy Session Note  Patient Details  Name: Christopher Chapman MRN: [id number] Date of Birth: Apr 06, 1955  Today's Date: 10/24/2020 OT Individual Time: 9201-0071 OT Individual Time Calculation (min): 49 min    Short Term Goals: Week 1:  OT Short Term Goal 1 (Week 1): STG = LTG due to estimated LOS  Skilled Therapeutic Interventions/Progress Updates:    Patient in bed, alert, cooperative.  He denies pain and states that "there is nothing wrong with me, I do not have special needs".  Bed mobility mod I.  ambulation in room without AD CS to/from bed chair at sink and toilet.  He completes bathing sit and stand at sink with supervision - occ cues for safety.  Toileting distant supervision, cues for safety.  Dressing with set up.  Oral care/grooming mod I seated.  Overall talkative and cooperative.  He returned to bed at close of session, bed alarm set and call bell in hand.  He states "I guess I can go home when the staples are taken out of my head"  Therapy Documentation Precautions:  Precautions Precautions: Fall Restrictions Weight Bearing Restrictions: No   Therapy/Group: Individual Therapy  Carlos Levering 10/24/2020, 7:35 AM

## 2020-10-24 NOTE — Progress Notes (Signed)
PROGRESS NOTE   Subjective/Complaints:  No issues overnite , asking about chlorhexidine wipes Appreciate nephro note, functioning AV fistula ROS: Denies CP, SOB, N/V/D  Objective:   No results found. No results for input(s): WBC, HGB, HCT, PLT in the last 72 hours.  No results for input(s): NA, K, CL, CO2, GLUCOSE, BUN, CREATININE, CALCIUM in the last 72 hours.   Intake/Output Summary (Last 24 hours) at 10/24/2020 0803 Last data filed at 10/23/2020 2103 Gross per 24 hour  Intake 600 ml  Output --  Net 600 ml         Physical Exam: Vital Signs Blood pressure (!) 142/71, pulse 70, temperature 98.6 F (37 C), temperature source Oral, resp. rate 16, height 6\' 1"  (1.854 m), weight 98.2 kg, SpO2 100 %.  General: No acute distress Mood and affect are appropriate Heart: Regular rate and rhythm no rubs murmurs or extra sounds Lungs: Clear to auscultation, breathing unlabored, no rales or wheezes Abdomen: Positive bowel sounds, soft nontender to palpation, nondistended Extremities: No clubbing, cyanosis, or edema Skin: Staples intact RIght parietal  Neurologic: Cranial nerves II through XII intact, motor strength is 5/5 in bilateral deltoid, bicep, tricep, grip, hip flexor, knee extensors, ankle dorsiflexor and plantar flexor Sensory exam normal sensation to light touch and proprioception in bilateral upper and lower extremities Cerebellar exam normal finger to nose to finger as well as heel to shin in bilateral upper and lower extremities Musculoskeletal: Full range of motion in all 4 extremities. No joint swelling  Musc: No edema in extremities.  No tenderness in extremities. Neuro: Alert Does lack insight and awareness.  Motor: Grossly intact  Assessment/Plan: 1. Functional deficits which require 3+ hours per day of interdisciplinary therapy in a comprehensive inpatient rehab setting. Physiatrist is providing close  team supervision and 24 hour management of active medical problems listed below. Physiatrist and rehab team continue to assess barriers to discharge/monitor patient progress toward functional and medical goals  Care Tool:  Bathing    Body parts bathed by patient: Right arm, Right lower leg, Left lower leg, Left arm, Chest, Abdomen, Face, Front perineal area, Buttocks, Right upper leg, Left upper leg   Body parts bathed by helper: Right lower leg, Left lower leg Body parts n/a: Right lower leg, Left lower leg   Bathing assist Assist Level: Set up assist     Upper Body Dressing/Undressing Upper body dressing   What is the patient wearing?: Pull over shirt    Upper body assist Assist Level: Set up assist    Lower Body Dressing/Undressing Lower body dressing      What is the patient wearing?: Pants     Lower body assist Assist for lower body dressing: Minimal Assistance - Patient > 75%     Toileting Toileting Toileting Activity did not occur (Clothing management and hygiene only): N/A (no void or bm)  Toileting assist Assist for toileting: Supervision/Verbal cueing     Transfers Chair/bed transfer  Transfers assist     Chair/bed transfer assist level: Supervision/Verbal cueing     Locomotion Ambulation   Ambulation assist      Assist level: Supervision/Verbal cueing Assistive device: Other (comment) (wc as  AD) Max distance: 150   Walk 10 feet activity   Assist     Assist level: Supervision/Verbal cueing Assistive device: Other (comment)   Walk 50 feet activity   Assist    Assist level: Supervision/Verbal cueing Assistive device: Other (comment)    Walk 150 feet activity   Assist Walk 150 feet activity did not occur: Safety/medical concerns (fatigue, weakness, decreased balance/postural control)  Assist level: Supervision/Verbal cueing Assistive device: Other (comment)    Walk 10 feet on uneven surface  activity   Assist Walk 10 feet  on uneven surfaces activity did not occur: Safety/medical concerns (fatigue, weakness, decreased balance/postural control)         Wheelchair     Assist Is the patient using a wheelchair?: Yes Type of Wheelchair: Manual    Wheelchair assist level: Supervision/Verbal cueing Max wheelchair distance: 169ft    Wheelchair 50 feet with 2 turns activity    Assist        Assist Level: Supervision/Verbal cueing   Wheelchair 150 feet activity     Assist      Assist Level: Supervision/Verbal cueing   Blood pressure (!) 142/71, pulse 70, temperature 98.6 F (37 C), temperature source Oral, resp. rate 16, height 6\' 1"  (1.854 m), weight 98.2 kg, SpO2 100 %.  Medical Problem List and Plan: 1.  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  Drains discontinued 10/16/2020 Continue CIR PT, OT, SLP, team conf in am  2.  Antithrombotics: -DVT/anticoagulation: SCDs.   Vascular study negative             -antiplatelet therapy: N/A 3. Pain Management: Hydrocodone as needed 4. Mood: Provide emotional support             -antipsychotic agents: N/A 5. Neuropsych: This patient is?  Fully capable of making decisions on his own behalf. 6. Skin/Wound Care: 10/14 Remove scalp staples beginning of next week 7. Fluids/Electrolytes/Nutrition: encourage PO 8.  Seizure prophylaxis.  Keppra 250 mg twice daily-- no seizure activity should be able to stop prior to d/c from CIR 9.  End-stage renal disease with decreased donor renal transplantatiion 09/13/2016.  Continue hemodialysis as directed  -HD after therapies to maximize participation during the day  Appreciate nephro recs 10.  Acute on chronic anemia.  Continue Aranesp  Hemoglobin 7.5 on 10/12, labs with HD  Labs with HD 11.  Hypertension.  Norvasc 5 mg daily, labetalol 300 mg  3 times daily, Cozaar 25 mg daily.      Vitals:   10/23/20 2106 10/24/20 0522  BP: (!) 150/78 (!) 142/71  Pulse: 77 70  Resp: 16 16  Temp: 98.9 F (37.2 C) 98.6 F (37 C)  SpO2: 100% 100%   Fair control 12.  Prediabetes.  Hemoglobin A1c 5.6.  SSI  Controlled on 10/16 13.  History of polysubstance abuse.  Counseling, states he has being using ETOH and tobacco but not other substances 14.?  STD  10/14 UA +/-, UCX with insignificant growth   -all urine yellow, clear   LOS: 7 days A FACE TO FACE EVALUATION WAS PERFORMED  Charlett Blake 10/24/2020, 8:03 AM

## 2020-10-24 NOTE — Progress Notes (Signed)
Physical Therapy TBI Note  Patient Details  Name: Christopher Chapman MRN: [id number] Date of Birth: [birth year]  Today's Date: 10/24/2020 PT Individual Time: 1100-1155 PT Individual Time Calculation (min): 55 min   Short Term Goals: Week 1:  PT Short Term Goal 1 (Week 1): STG=LTG due to LOS  Skilled Therapeutic Interventions/Progress Updates:    Patient received sitting up in bed, agreeable to PT. He denies pain. Patient extremely hyperverbal throughout session, very poor safety awareness and impulsive. He was able to come sit edge of bed with supervision and transfer to wc via stand pivot with CGA. Patient propelling himself in wc using B UE/LE and frequent redirection to task. BITS dual cog+ motor tasks completed in standing with CGA for balance. Patient required frequent reminders for task at hand. Increased cues needed for tasks that required visual scanning. Patient impulsively standing up from wc right after PT had unlocked brakes to begin pushing patient in wc to next therapy gym. He ambulated ~1103ft with no AD and light MinA. General unsteadiness noted. Patient with difficulty accurately completing dual tasks while ambulating safely, but was not receptive to education on minimizing multi tasking. Standing anticipatory and reactionary balance tasks completed with playing basketball and then tossing ball at rebounder. Patient requesting to play checkers- did so in standing. Patient ambulated back to his room with no AD and CGA. Bed alarm on, call light within reach.   Therapy Documentation Precautions:  Precautions Precautions: Fall Restrictions Weight Bearing Restrictions: No  Agitated Behavior Scale: TBI Observation Details Observation Environment: patients room Start of observation period - Date: 10/24/20 Start of observation period - Time: 1100 End of observation period - Date: 10/24/20 End of observation period - Time: 1200 Agitated Behavior Scale (DO NOT LEAVE BLANKS) Short  attention span, easy distractibility, inability to concentrate: Present to a moderate degree Impulsive, impatient, low tolerance for pain or frustration: Present to a slight degree Uncooperative, resistant to care, demanding: Absent Violent and/or threatening violence toward people or property: Absent Explosive and/or unpredictable anger: Absent Rocking, rubbing, moaning, or other self-stimulating behavior: Absent Pulling at tubes, restraints, etc.: Absent Wandering from treatment areas: Absent Restlessness, pacing, excessive movement: Absent Repetitive behaviors, motor, and/or verbal: Present to a slight degree Rapid, loud, or excessive talking: Present to a moderate degree Sudden changes of mood: Absent Easily initiated or excessive crying and/or laughter: Present to a slight degree Self-abusiveness, physical and/or verbal: Absent Agitated behavior scale total score: 21      Therapy/Group: Individual Therapy  Karoline Caldwell, PT, DPT, CBIS  10/24/2020, 12:00 PM

## 2020-10-25 LAB — GLUCOSE, CAPILLARY
Glucose-Capillary: 92 mg/dL (ref 70–99)
Glucose-Capillary: 107 mg/dL — ABNORMAL HIGH (ref 70–99)
Glucose-Capillary: 118 mg/dL — ABNORMAL HIGH (ref 70–99)
Glucose-Capillary: 130 mg/dL — ABNORMAL HIGH (ref 70–99)

## 2020-10-25 NOTE — Progress Notes (Signed)
[facility] NEPHROLOGY PROGRESS NOTE  Assessment/ Plan:  # Chronic subdural hematoma: Status post evacuation originally on 9/28 with repeat evacuation surgery performed 10/7.  Currently at the inpatient rehabilitation unit for intensive PT/OT for management of functional deficits.  #. 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  On Prograf. Stopped MMF 10/17.  Receiving HD MWF schedule, Outpatient HD arranged at [facility] (first shift) upon discharge.  # Anemia: Low hemoglobin and hematocrit without overt blood loss status post SDH evacuation.  Continue darbe 60 qMon and avoiding heparin with dialysis.  # CKD-MBD: Continue calcitriol and sevelamer.  Phosphorus level at goal.  # Nutrition: Continue renal diet with oral nutritional supplements and renal multivitamin.  # Hypertension: Blood pressure under decent control, will continue to monitor with hemodialysis and ongoing antihypertensive therapy.  Subjective: Seen and examined. No new event. HD yesterday uneventful 2L UF.  Tentative DC is 10/20  Objective Vital signs in last 24 hours: Vitals:   10/24/20 1921 10/25/20 0411 10/25/20 0500 10/25/20 1320  BP: 134/64 133/68  120/60  Pulse: 72 67  67  Resp: 14 14  18   Temp: 98.6 F (37 C) 98.7 F (37.1 C)  98.1 F (36.7 C)  TempSrc: Oral Oral  Oral  SpO2: 100% 97%  100%  Weight:   96.6 kg   Height:       Weight change: 0.8 kg  Intake/Output Summary (Last 24 hours) at 10/25/2020 1347 Last data filed at 10/25/2020 0800 Gross per 24 hour  Intake 480 ml  Output 3100 ml  Net -2620 ml        Labs: Basic Metabolic Panel: Recent Labs  Lab 10/19/20 1150  NA 137  K 4.5  CL 103  CO2 27  GLUCOSE 106*  BUN 37*  CREATININE 7.46*  CALCIUM 7.9*  PHOS 4.5    Liver Function Tests: Recent Labs  Lab 10/19/20 1150  ALBUMIN 2.5*    No results for  input(s): LIPASE, AMYLASE in the last 168 hours. No results for input(s): AMMONIA in the last 168 hours. CBC: Recent Labs  Lab 10/19/20 1150  WBC 8.8  HGB 7.5*  HCT 24.8*  MCV 92.2  PLT 198    Cardiac Enzymes: No results for input(s): CKTOTAL, CKMB, CKMBINDEX, TROPONINI in the last 168 hours. CBG: Recent Labs  Lab 10/24/20 1153 10/24/20 1752 10/24/20 2054 10/25/20 0552 10/25/20 1150  GLUCAP 110* 121* 107* 92 107*     Iron Studies: No results for input(s): IRON, TIBC, TRANSFERRIN, FERRITIN in the last 72 hours. Studies/Results: No results found.  Medications: Infusions:   Scheduled Medications:  allopurinol  200 mg Oral Daily   amLODipine  5 mg Oral Daily   atorvastatin  40 mg Oral Daily   calcitRIOL  0.5 mcg Oral Q M,W,F-HD   Chlorhexidine Gluconate Cloth  6 each Topical Q0600   darbepoetin (ARANESP) injection - DIALYSIS  60 mcg Intravenous Q Mon-HD   insulin aspart  0-15 Units Subcutaneous TID WC   labetalol  300 mg Oral TID   levETIRAcetam  250 mg Oral BID   losartan  25 mg Oral Daily   pantoprazole  40 mg Oral QHS   senna  1 tablet Oral BID   sevelamer carbonate  800 mg Oral TID WC   tacrolimus  2 mg Oral BID    have reviewed scheduled and prn medications.  Physical Exam: General:NAD, pleasant male lying on bed  comfortable Heart:RRR, s1s2 nl Lungs:clear b/l, no crackle Abdomen:soft, Non-tender, non-distended Extremities:No edema Dialysis Access: Left upper extremity AV fistula has good thrill and bruit.  Christopher Chapman B Umberto Pavek 10/25/2020,1:47 PM  LOS: 8 days

## 2020-10-25 NOTE — Progress Notes (Signed)
Patient ID: Christopher Chapman, male   DOB: 04-May-1955, 65 y.o.   MRN: [id number]  SW met with pt in room to provide updates from team conference, and d/c date 10/20. SW discussed with pt transportation home. No plan. SW to arrange transportation home for pt.   SW updated [name] navigator [phone]) to inform on d/c changes and will work on transportation to home. SW informed unsure if transportation can assist with ride to dialysis center tomorrow after discharge. SW to follow-up.   SW scheduled transportation to home with [facility] [phone]) for pick up at time 11am-11:30am. Transportation will take pt to [facility]. Location) to complete new patient paperwork, with pick up between 12pm-12:30pm. SW scheduled dialysis pick up 5am-5:30pm for Friday (10/22) and  Monday (10/24). Pt will need to call on Friday to schedule transportation for next week as reservations can only be made seven days out. SW will meet with pt to provide updates.  *SW left message for Melven Sartorius to inform on transportation arrangements  tomorrow.   Loralee Pacas, MSW, [place] Office: [phone] Cell: [phone] Fax: [phone]

## 2020-10-25 NOTE — Progress Notes (Signed)
Speech Language Pathology Daily Session Note  Patient Details  Name: STORMY SABOL MRN: [id number] Date of Birth: 1955-01-24  Today's Date: 10/25/2020 SLP Individual Time: 9735-3299 SLP Individual Time Calculation (min): 29 min  Short Term Goals: Week 1: SLP Short Term Goal 1 (Week 1): STG=LTG due to short ELOS (7-10 days)  Skilled Therapeutic Interventions: Pt seen for skilled ST with focus on cognitive goals. SLP facilitated session by providing mod-max A for safety and problem solving questions related to home environment. Pt continues to demonstrate poor awareness of deficits, minimizes recent medical events and discusses desire to return to high risk lifestyle choices. Pt does appear to utilize call button appropriately in room, states when he first arrived to unit he tried to get up independently but realized he was too weak and has called for help since. When SLP attempted to carryover safety and fall risk precautions for discharge home, pt denies difficulty and states he is only worried about how steep the stairs are. Pt left in bed with alarm set and all needs within reach. Cont ST POC.   Pain Pain Assessment Pain Scale: 0-10 Pain Score: 0-No pain  Therapy/Group: Individual Therapy  Dewaine Conger 10/25/2020, 11:52 AM

## 2020-10-25 NOTE — Progress Notes (Signed)
Speech Language Pathology TBI Note  Patient Details  Name: Christopher Chapman MRN: [id number] Date of Birth: [birth year]  Today's Date: 10/25/2020 SLP Individual Time: 1500-1530 SLP Individual Time Calculation (min): 30 min  Short Term Goals: Week 1: SLP Short Term Goal 1 (Week 1): STG=LTG due to short ELOS (7-10 days)  Skilled Therapeutic Interventions: Pt received semi-reclined in bed and agreeable to skilled ST intervention with focus on cognitive goals. SLP facilitated session by providing overall max A verbal cues for problem solving/judgement with home safety scenarios with responses suggestive of poor safety awareness and significantly reduced insight into deficits. Pt required mod A verbal redirection for sustained attention and would often interject therapist with unrelated stories and would frequently deny cognitive deficits. SLP reinforced education on cognitive-communication s/p TBI and therapy recommendations for 24 hour supervision including why this has been recommended. Pt minimally accepting of education however did independently write down "cognitive changes" in his notebook on bedside table. Patient was left in bed with alarm activated and immediate needs within reach at end of session. Continue per current plan of care.     Pain Pain Assessment Pain Scale: 0-10 Pain Score: 0-No pain  Agitated Behavior Scale: TBI Observation Details Observation Environment: pt room Start of observation period - Date: 10/25/20 Start of observation period - Time: 1500 End of observation period - Date: 10/25/20 End of observation period - Time: 1530 Agitated Behavior Scale (DO NOT LEAVE BLANKS) Short attention span, easy distractibility, inability to concentrate: Present to a moderate degree Impulsive, impatient, low tolerance for pain or frustration: Present to a slight degree Uncooperative, resistant to care, demanding: Present to a slight degree Violent and/or threatening violence  toward people or property: Absent Explosive and/or unpredictable anger: Absent Rocking, rubbing, moaning, or other self-stimulating behavior: Absent Pulling at tubes, restraints, etc.: Absent Wandering from treatment areas: Absent Restlessness, pacing, excessive movement: Present to a slight degree Repetitive behaviors, motor, and/or verbal: Present to a slight degree Rapid, loud, or excessive talking: Present to a moderate degree Sudden changes of mood: Absent Easily initiated or excessive crying and/or laughter: Absent Self-abusiveness, physical and/or verbal: Absent Agitated behavior scale total score: 22  Therapy/Group: Individual Therapy  [name] 10/25/2020, 4:05 PM

## 2020-10-25 NOTE — Progress Notes (Signed)
PROGRESS NOTE   Subjective/Complaints:  No new issues. Slept soundly overnight. Slow to awaken this morning  ROS: Limited due to cognitive/behavioral   Objective:   No results found. No results for input(s): WBC, HGB, HCT, PLT in the last 72 hours.  No results for input(s): NA, K, CL, CO2, GLUCOSE, BUN, CREATININE, CALCIUM in the last 72 hours.   Intake/Output Summary (Last 24 hours) at 10/25/2020 0911 Last data filed at 10/25/2020 0500 Gross per 24 hour  Intake 240 ml  Output 3100 ml  Net -2860 ml        Physical Exam: Vital Signs Blood pressure 133/68, pulse 67, temperature 98.7 F (37.1 C), temperature source Oral, resp. rate 14, height 6\' 1"  (1.854 m), weight 96.6 kg, SpO2 97 %.  Constitutional: No distress . Vital signs reviewed. HEENT: NCAT, EOMI, oral membranes moist Neck: supple Cardiovascular: RRR without murmur. No JVD    Respiratory/Chest: CTA Bilaterally without wheezes or rales. Normal effort    GI/Abdomen: BS +, non-tender, non-distended Ext: no clubbing, cyanosis, or edema Psych: pleasant and cooperative  Skin: Staples intact RIght parietal. LUE AVF Neurologic: impaired insight and awareness. Cranial nerves II through XII intact, motor strength is 5/5 in bilateral deltoid, bicep, tricep, grip, hip flexor, knee extensors, ankle dorsiflexor and plantar flexor. Sensory exam normal for light touch and pain in all 4 limbs. No limb ataxia or cerebellar signs. No abnormal tone appreciated.   Musculoskeletal: Full range of motion in all 4 extremities. No joint swelling  Musc: No edema in extremities.  No tenderness in extremities. Neuro: Alert Does lack insight and awareness.  Motor: Grossly intact  Assessment/Plan: 1. Functional deficits which require 3+ hours per day of interdisciplinary therapy in a comprehensive inpatient rehab setting. Physiatrist is providing close team supervision and 24 hour  management of active medical problems listed below. Physiatrist and rehab team continue to assess barriers to discharge/monitor patient progress toward functional and medical goals  Care Tool:  Bathing    Body parts bathed by patient: Right arm, Right lower leg, Left lower leg, Left arm, Chest, Abdomen, Face, Front perineal area, Buttocks, Right upper leg, Left upper leg   Body parts bathed by helper: Right lower leg, Left lower leg Body parts n/a: Right lower leg, Left lower leg   Bathing assist Assist Level: Supervision/Verbal cueing     Upper Body Dressing/Undressing Upper body dressing   What is the patient wearing?: Pull over shirt    Upper body assist Assist Level: Set up assist    Lower Body Dressing/Undressing Lower body dressing      What is the patient wearing?: Pants     Lower body assist Assist for lower body dressing: Supervision/Verbal cueing     Toileting Toileting Toileting Activity did not occur (Clothing management and hygiene only): N/A (no void or bm)  Toileting assist Assist for toileting: Supervision/Verbal cueing     Transfers Chair/bed transfer  Transfers assist     Chair/bed transfer assist level: Supervision/Verbal cueing     Locomotion Ambulation   Ambulation assist      Assist level: Supervision/Verbal cueing Assistive device: Other (comment) (wc as AD) Max distance: 150  Walk 10 feet activity   Assist     Assist level: Supervision/Verbal cueing Assistive device: Other (comment)   Walk 50 feet activity   Assist    Assist level: Supervision/Verbal cueing Assistive device: Other (comment)    Walk 150 feet activity   Assist Walk 150 feet activity did not occur: Safety/medical concerns (fatigue, weakness, decreased balance/postural control)  Assist level: Supervision/Verbal cueing Assistive device: Other (comment)    Walk 10 feet on uneven surface  activity   Assist Walk 10 feet on uneven surfaces  activity did not occur: Safety/medical concerns (fatigue, weakness, decreased balance/postural control)         Wheelchair     Assist Is the patient using a wheelchair?: Yes Type of Wheelchair: Manual    Wheelchair assist level: Supervision/Verbal cueing Max wheelchair distance: 189ft    Wheelchair 50 feet with 2 turns activity    Assist        Assist Level: Supervision/Verbal cueing   Wheelchair 150 feet activity     Assist      Assist Level: Supervision/Verbal cueing   Blood pressure 133/68, pulse 67, temperature 98.7 F (37.1 C), temperature source Oral, resp. rate 14, height 6\' 1"  (1.854 m), weight 96.6 kg, SpO2 97 %.  Medical Problem List and Plan: 1.  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  Drains discontinued 10/16/2020 -Continue CIR therapies including PT, OT, and SLP. Interdisciplinary team conference today to discuss goals, barriers to discharge, and dc planning.   2.  Antithrombotics: -DVT/anticoagulation: SCDs.   Vascular study negative             -antiplatelet therapy: N/A 3. Pain Management: Hydrocodone as needed 4. Mood: Provide emotional support             -antipsychotic agents: N/A 5. Neuropsych: This patient is?  Fully capable of making decisions on his own behalf. 6. Skin/Wound Care: Remove scalp staples this week 7. Fluids/Electrolytes/Nutrition: encourage PO 8.  Seizure prophylaxis.  Keppra 250 mg twice daily-- no seizure activity should be able to stop prior to d/c from CIR 9.  End-stage renal disease with decreased donor renal transplantatiion 09/13/2016.  Continue hemodialysis as directed  -HD after therapies to maximize participation during the day  Appreciate nephro recs 10.  Acute on chronic anemia.  Continue Aranesp  Hemoglobin 7.5 on 10/12, labs with  HD  Labs with HD 11.  Hypertension.  Norvasc 5 mg daily, labetalol 300 mg 3 times daily, Cozaar 25 mg daily.      Vitals:   10/24/20 1921 10/25/20 0411  BP: 134/64 133/68  Pulse: 72 67  Resp: 14 14  Temp: 98.6 F (37 C) 98.7 F (37.1 C)  SpO2: 100% 97%   controlled 12.  Prediabetes.  Hemoglobin A1c 5.6.  SSI  Controlled on 10/18 13.  History of polysubstance abuse.  Counseling, states he has being using ETOH and tobacco but not other substances 14.?  STD  10/14 UA +/-, UCX with insignificant growth   -all urine yellow, clear   LOS: 8 days A FACE TO FACE EVALUATION WAS PERFORMED  Meredith Staggers 10/25/2020, 9:11 AM

## 2020-10-25 NOTE — Progress Notes (Signed)
Occupational Therapy Session Note  Patient Details  Name: Christopher Chapman MRN: [id number] Date of Birth: Sep 05, 1955  Today's Date: 10/25/2020 OT Individual Time: 1030-1100   &   1400-1500 OT Individual Time Calculation (min): 30 min   &   60 min   Short Term Goals: Week 1:  OT Short Term Goal 1 (Week 1): STG = LTG due to estimated LOS  Skilled Therapeutic Interventions/Progress Updates:    AM session:   patient in the bathroom at start of session, able to complete voiding, hygiene and clothing management with distant supervision.  Ambulates in room without assistive device with CS.  He denies pain and agrees to shower/change of clothes this afternoon.  Completed oral care and hand hygiene with supervision.  Ambulation on unit without assistive device CS/CGA.  Completed visual saccade activity using BITS system - A-Z 1:19, reaction time 3.05 sec.  Started visual/auditory memory task with sequence 2-5: he had difficulty initially understanding directions but was able to complete one sequence without errors, stopped abruptly to return to room for toileting, but stated that he no longer had to go upon return to room and went back to bed.  Bed alarm set and call bell in hand at close of session    PM session:   Patient in bed, working with social work on placing call for transportation.  He appropriately wrote notes to help him recall when he needed to set up transportation but needed occ cues to clarify days.  He denies pain.  Set up for shower as planned but he declined.  Changed clothes with set up.  Completed ambulation on unit without AD CS.  Completed upper body ergometer x 5 minutes in stance without difficulty.  Upper body conditioning activities in seated position without difficulty.  Ambulated to kitchen, practiced reaching into high and low cabinets with CS - one episode of dizziness after low reach and turning which was relieved with sitting, no LOB.  He returned to bed at close of session,  bed alarm set and needs met at this time.      Therapy Documentation Precautions:  Precautions Precautions: Fall Restrictions Weight Bearing Restrictions: No  Therapy/Group: Individual Therapy  Carlos Levering 10/25/2020, 7:38 AM

## 2020-10-25 NOTE — Progress Notes (Signed)
Physical Therapy TBI Note  Patient Details  Name: Christopher Chapman MRN: [id number] Date of Birth: [birth year]  Today's Date: 10/25/2020 PT Individual Time: 1105-1155 PT Individual Time Calculation (min): 50 min   and  Today's Date: 10/25/2020 PT Missed Time: 10 Minutes Missed Time Reason: Patient fatigue;Patient unwilling to participate  Short Term Goals: Week 1:  PT Short Term Goal 1 (Week 1): STG=LTG due to LOS  Skilled Therapeutic Interventions/Progress Updates:   Pt received supine in bed and agreeable to therapy session. Pt hyperverbal throughout session with difficulty directing to current task and sustaining attention to it. Supine>sitting R EOB independently. Sit<>stands, no AD, with supervision throughout session. Gait training ~173ft to main therapy gym, no AD, with CGA progressed to close supervision - demos general increased R/L postural sway but no LOB and overall decreased gait speed - therapist discussed recommendation of trailing an AD for use at home to increase pt safety and independence; however, pt repeatedly declined stating he does not feel that he needs an AD. Stair navigation training in hallway stairwell 11steps using R HR to replicate home set-up (lives in multi-level home) - supervision for safety - ascended with reciprocal stepping and descended primarily with step-to pattern. Dynamic gait training via side stepping through agility ladder during dual-cognitive task of reading off numbers on the wall in front of him - close supervision for safety and tactile cuing to maintain sideways position. Pt requires frequent seated rest breaks throughout session stating he feels like he has "no energy." Pt reports sudden urge to void bowels. Gait ~194ft back to room, no AD, with supervision as described above; however, pt walks to bed and sits down stating the urge passed. With encouragement, agreeable to continue therapy session. Gait ~120ft to ADL apartment as described above.  Participated in gait training in ADL apartment focusing on navigating room environment with furniture, no AD, with continued supervision and no LOB or significant unsteadiness. Pt able to locate frozen meal (as this it what pt reports eating at home) and is able to verbalize the steps to prepare this meal. Pt continues to require frequent seated rest breaks and states "I don't feel good" therefore assessed his vitals: BP 101/72 (MAP 80), HR 70bpm pt requesting to return to his room. Gait training ~179ft back to his room with supervision as described above. Pt requesting to rest and end therapy session early. Sit>supine independently. Pt left supine in bed with needs in reach and bed alarm on. Missed 10 minutes of skilled physical therapy.  Therapy Documentation Precautions:  Precautions Precautions: Fall Restrictions Weight Bearing Restrictions: No   Agitated Behavior Scale:  TBI Observation Details Observation Environment: CIR Start of observation period - Date: 10/25/20 Start of observation period - Time: 1105 End of observation period - Date: 10/25/20 End of observation period - Time: 1155 Agitated Behavior Scale (DO NOT LEAVE BLANKS) Short attention span, easy distractibility, inability to concentrate: Present to a moderate degree Impulsive, impatient, low tolerance for pain or frustration: Present to a slight degree Uncooperative, resistant to care, demanding: Absent Violent and/or threatening violence toward people or property: Absent Explosive and/or unpredictable anger: Absent Rocking, rubbing, moaning, or other self-stimulating behavior: Absent Pulling at tubes, restraints, etc.: Absent Wandering from treatment areas: Absent Restlessness, pacing, excessive movement: Present to a slight degree Repetitive behaviors, motor, and/or verbal: Present to a slight degree Rapid, loud, or excessive talking: Present to a moderate degree Sudden changes of mood: Absent Easily initiated or  excessive crying and/or laughter: Absent  Self-abusiveness, physical and/or verbal: Absent Agitated behavior scale total score: 21    Therapy/Group: Individual Therapy  Tawana Scale , PT, DPT, NCS, CSRS  10/25/2020, 8:01 AM

## 2020-10-25 NOTE — Progress Notes (Signed)
Case discussed with Inpt rehab CSW. Pt is for d/c on 10/20. Spoke to [employer], Agricultural consultant, at [employer]. Cathy aware of pt's d/c on 10/20 and need to start on 10/21. CSW has made transportation arrangements for pt to be transported at d/c to [facility] to complete paperwork. Transport will return in 1 hr to transport pt home. Cathy at [facility] aware of this info and to have paperwork ready when pt arrives from hospital. Will f/u with pt again tomorrow regarding out-pt arrangements to confirm his understanding of plans. Met with pt and discussed details previously in pt's hospital stay. Will follow and assist.   Melven Sartorius Renal Navigator [phone]

## 2020-10-25 NOTE — Progress Notes (Signed)
Speech Language Pathology Discharge Summary  Patient Details  Name: Christopher Chapman MRN: [id number] Date of Birth: [birth year]  Today's Date: 10/26/2020 SLP Individual Time: 0900-1000 SLP Individual Time Calculation (min): 60 min  Patient has met 3 of 5 long term goals.  Patient to discharge at overall Min;Supervision;Mod (supervision in basic problem solving and use of compensatory memory strategies, min-to-mod A for functional short-term memory and awareness.) level.  Reasons goals not met: Some goals not met due to slow progress and inconsistent awareness and/or acceptance of deficits   Clinical Impression/Discharge Summary: Patient has made slow yet functional gains and has met 3 of 5 long-term goals this admission due to improved problem solving, attention, and functional recall. Patient currently presenting as Rancho level VI-VII overall. Intellectual and emergent awareness continues to fluctuate and pt has been known to minimize cognitive deficits. Patient is currently completing basic problem solving with overall sup-to-min A and requiring min-to-mod A for functional recall and safety awareness.  Family members/caregivers unable to be present for education though education with patient has been completed and handouts on cognitive-communication deficits/strategies/safety considerations following TBI have been provided to patient and family. Patient is recommended to have 24 hour supervision at discharge due to cognitive deficits. Patient has verbalized understanding and also verbalized he understand he cannot drive until MD provides clearance. Patient would benefit from continued SLP services to maximize cognitive functioning, functional independence, and safety within home environment/community.  Care Partner:  Caregiver Able to Provide Assistance: Yes  Type of Caregiver Assistance: Physical;Cognitive  Recommendation:  24 hour supervision/assistance;Outpatient SLP  Rationale for SLP  Follow Up: Maximize cognitive function and independence   Equipment: none   Reasons for discharge: Discharged from hospital   Patient/Family Agrees with Progress Made and Goals Achieved: Yes  Skilled Therapeutic Interventions: Pt received sleeping soundly in bed and roused to verbal stimuli. Pt agreeable to skilled ST intervention with focus on cognitive goals. SLP facilitated anticipatory problem solving with home safety scenarios. Pt exhibited improved safety and emergent awareness this date as compared to yesterday's session by emphasizing important considerations upon returning home to support his mental health and minimizing distractions. Pt pointed out a handout provided to him containing information on "driving after a TBI" and expressed he does not plan to drive right away due to "being told not to" and "slower reaction time." However, he continues to express "there's nothing wrong with my mind" and minimizes his deficits. SLP re-administered the [facility] Mental Status Examination (SLUMS) and patient scored  26/30 points with a score of 27 or above considered normal. Score suggestive of improvement as compared to initial score of 15/30 obtained in 10/18/2020. Further informal assessment continues to reveal however impaired working and short-term memory, intellectual and emergent awareness, problem solving, and attention skills. Continue to recommend 24 hour supervision at discharge for pt's safety due to most significant deficits being in cognitive with significantly reduced awareness/insight and reinforced this recommendation. Pt verbalized understanding. Patient was left in bed with alarm activated and immediate needs within reach at end of session. Continue per current plan of care.    Corban Kistler T Bellami Farrelly 10/26/2020, 4:09 PM

## 2020-10-25 NOTE — Patient Care Conference (Signed)
Inpatient RehabilitationTeam Conference and Plan of Care Update Date: 10/25/2020   Time: 10:00 AM    Patient Name: Christopher Chapman      Medical Record Number: [id number]  Date of Birth: December 07, 1955 Sex: Male         Room/Bed: 4M08C/4M08C-01 Payor Info: Payor: MEDICAID [place] / Plan: MEDICAID OF [place] / Product Type: *No Product type* /    Admit Date/Time:  10/17/2020  5:39 PM  Primary Diagnosis:  Subdural hematoma  Hospital Problems: Principal Problem:   Subdural hematoma Active Problems:   Traumatic subdural hematoma   Prediabetes   Acute on chronic anemia   ESRD on dialysis [facility])    Expected Discharge Date: Expected Discharge Date: 10/27/20  Team Members Present: Physician leading conference: Dr. Alger Simons Social Worker Present: Loralee Pacas, [name] Nurse Present: Dorthula Nettles, RN PT Present: Page Spiro, PT OT Present: Elisabeth Most, OT SLP Present: [place] Anna, SLP PPS Coordinator present : Gunnar Fusi, SLP     Current Status/Progress Goal Weekly Team Focus  Bowel/Bladder   continent to bowel and bladder  Maintain continence  toilet as needed   Swallow/Nutrition/ Hydration             ADL's   supervision/set up for adl tasks, cues for safety, supervision for mobility/transfers  mod I/CS  patient/family education, basic HM, safety   Mobility   supervision bed mobility, CGA sit<>stand and stand pivot transfers without AD, CGA gait up to 153ft without AD, CGA 4 stairs using B HRs  mod-I transfers and supervision gait using LRAD  dynamic gait training, pt education, activity tolerance, dynamic standing balance, stair navigation training, B LE strengthening   Communication   Rancho Level VI-VII-Overall Min-Mod A  Min A  emergent awareness, safety awareness, functional problem solving, attention and recall   Safety/Cognition/ Behavioral Observations  aware of safety limitations, uses the call bell         Pain   denies pain  no pain  assesspain q 4 hr  and prn   Skin   staples to right side of head no drainage  no signs of infection  assess skin q shift and prn     Discharge Planning:  Pt needs to d/c to home at Mod I since 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 Pt pre-certified for [facility] for transportation to dialysis- [place] MWF at 6pm. Pt traditional Medicaid will transition to [facility] on 11/08/20 and pt will need ot set up new transportation services with his insurance or pay for transportation.   Team Discussion: Medically stable. Staples out tomorrow. Continent B/B, LBM 10/17. Tylenol and Norco for pain. Will discontinue right forearm IV. CBG's good.   Patient on target to meet rehab goals: yes, able to do self care independently. Unsteady at times. Needs cues for thoroughness with hygiene. Contact guard overall, uses hand rails with stairs. Distracts himself, contradicts himself, poor memory. Giving strategies but states he doesn't need them.     *See Care Plan and progress notes for long and short-term goals.   Revisions to Treatment Plan:  Staples to be removed tomorrow.  Teaching Needs: Family education, medication management, pain management, skin/wound care, safety awareness.  Current Barriers to Discharge: Decreased caregiver support, Home enviroment access/layout, Wound care, Lack of/limited family support, Hemodialysis, and Medication compliance  Possible Resolutions to Barriers: Set up for Medicaid transportation,  Pre-certified for [facility] transportation for dialysis.     Medical Summary  Current Status: improved awareness and insight but still with limitations,. remove staples. HD per nephrology. still anemic  Barriers to Discharge: Medical stability   Possible Resolutions to [employer] Focus: daily assessment of labs and pt data   Continued Need for Acute Rehabilitation Level of Care: The patient requires daily medical management by a physician  with specialized training in physical medicine and rehabilitation for the following reasons: Direction of a multidisciplinary physical rehabilitation program to maximize functional independence : Yes Medical management of patient stability for increased activity during participation in an intensive rehabilitation regime.: Yes Analysis of laboratory values and/or radiology reports with any subsequent need for medication adjustment and/or medical intervention. : Yes   I attest that I was present, lead the team conference, and concur with the assessment and plan of the team.   Cristi [place]on 10/25/2020, 12:05 PM

## 2020-10-25 NOTE — Discharge Summary (Signed)
Physician Discharge Summary  Patient ID: Christopher Chapman MRN: [id number] DOB/AGE: 1955/01/25 65 y.o.  Admit date: 10/17/2020 Discharge date: 10/27/2020  Discharge Diagnoses:  Principal Problem:   Subdural hematoma Active Problems:   Traumatic subdural hematoma   Prediabetes   Acute on chronic anemia   ESRD on dialysis [facility]) Seizure prophylaxis Hypertension History of polysubstance abuse  Discharged Condition: stable  Significant Diagnostic Studies: CT HEAD WO CONTRAST  Result Date: 10/15/2020 CLINICAL DATA:  65 year old male postoperative day 1 subdural drain placement for recurrent right side subdural hematoma. EXAM: CT HEAD WITHOUT CONTRAST TECHNIQUE: Contiguous axial images were obtained from the base of the skull through the vertex without intravenous contrast. COMPARISON:  Head CT 10/12/2020, 10/05/2020. FINDINGS: Brain: Right side subdural drain now in place. Increased right side pneumocephalus but decreased volume of mixed density subdural hematoma since 10/12/2020. Decreased leftward midline shift from up to 10 mm at the anterior septum pellucidum previously now 4-5 mm. Improved patency of the right lateral ventricle. No ventriculomegaly. No cortically based acute infarct identified. Basilar cisterns remain patent. Vascular: Mild Calcified atherosclerosis at the skull base. Skull: Right side craniotomy.  No new osseous abnormality. Sinuses/Orbits: Visualized paranasal sinuses and mastoids are stable and well aerated. Other: Postoperative changes to the right scalp. Orbits soft tissues remain negative. IMPRESSION: 1. Right side subdural placed with decreased volume of mixed density right subdural hematoma since 10/12/2020. Mild subsequent pneumocephalus. Leftward midline shift decreased from up to 10 mm to 4 mm. Improved patency of the right lateral ventricle. 2. No new intracranial abnormality. Electronically Signed   By: Genevie Ann M.D.   On: 10/15/2020 08:26   CT HEAD WO CONTRAST  (5MM)  Result Date: 10/12/2020 CLINICAL DATA:  Follow-up subdural hemorrhage EXAM: CT HEAD WITHOUT CONTRAST TECHNIQUE: Contiguous axial images were obtained from the base of the skull through the vertex without intravenous contrast. COMPARISON:  10/06/2020 FINDINGS: Brain: Status post right parietal craniotomy with interval removal of previously noted drainage catheter. Mixed density collection overlying the right cerebral convexity, measuring up to 2.4 cm (series 3, image 23), previously up to 1.0 cm when remeasured similarly. Hyperdense material within the collection may reflect new hemorrhage. Decreased air within the collection. Increased right-to-left midline shift, now measuring 1.4 cm at the level of the corpus callosum, previously 1.0 cm when remeasured similarly. Increased effacement of the lateral ventricles, right greater than left. Slightly increased crowding of the left ambient cistern. Previously noted hypodensity in the left occipital lobe is not definitively seen on the current study. Vascular: No hyperdense vessel. Skull: Status post right parietal craniotomy. Sinuses/Orbits: Mild mucosal thickening in the left maxillary sinus. Remote left lamina papyracea fracture. Other: Postoperative soft tissue gas and swelling in the overlying scalp. IMPRESSION: 1. Symptoms right parietal craniotomy with removal of previously noted drainage catheter and interval increase in the size of a mixed density collection overlying the right cerebral convexity, now measuring up to 2.4 cm, with hyperdense material possibly reflecting acute blood products and decreased air within the collection. Increased right-to-left midline shift, now measuring up to 1.4 cm, with increased effacement of the lateral ventricles. 2. Previously noted hypodensity in the left occipital lobe is not seen on the prior study. These results were called by telephone at the time of interpretation on 10/12/2020 at 12:35 pm to provider DAVID Ronnald Ramp ,  who verbally acknowledged these results. Electronically Signed   By: Merilyn Baba M.D.   On: 10/12/2020 12:36   CT HEAD WO CONTRAST  Result  Date: 10/06/2020 CLINICAL DATA:  Follow-up subdural hemorrhage EXAM: CT HEAD WITHOUT CONTRAST TECHNIQUE: Contiguous axial images were obtained from the base of the skull through the vertex without intravenous contrast. COMPARISON:  Head CT 10/05/2020 FINDINGS: Brain: There has been interval right parietal craniotomy for evacuation of a subdural hematoma with a drainage catheter left in place terminating in the right parietal subdural space. The mixed density right subdural hematoma has decreased in size from 2.6 cm on the prior study to up to 1.1 cm on the current study. Hyperdense blood within the collection may reflect redistribution of acute blood products or new blood. There is postoperative pneumocephalus within the collection as well as postoperative soft tissue gas and swelling in the scalp. There is significantly decreased mass effect on the underlying brain with decreased effacement of the right lateral ventricle and improved leftward midline shift, currently measuring up to 6 mm at the level of the foramen of Monro, previously measured up to 1.3 cm at a similar level. There is crowding of the right ambient cistern, also improved. The remaining basal cisterns are patent. There is hypodensity in the left occipital lobe not seen on the prior study. Vascular: No hyperdense vessel or unexpected calcification. Skull: As above, there has been interval right parietal craniotomy. There is a remote fracture of the left lamina papyracea. Sinuses/Orbits: Stable. Other: None. IMPRESSION: 1. Interval right parietal craniotomy for drainage of a right holoconvexity subdural hematoma with a drainage catheter left in place terminating over the right parietal lobe. The hematoma has significantly decreased in size, now measuring up to 1.1 cm in maximal thickness, with decreased  leftward midline shift now measuring 6 mm. 2. Hypodensity in the left occipital lobe which may be artifactual. Attention on follow-up head CTs. Electronically Signed   By: Valetta Mole M.D.   On: 10/06/2020 08:31   CT HEAD WO CONTRAST (5MM)  Result Date: 10/05/2020 CLINICAL DATA:  [place], found down, left arm and left leg weakness EXAM: CT HEAD WITHOUT CONTRAST TECHNIQUE: Contiguous axial images were obtained from the base of the skull through the vertex without intravenous contrast. COMPARISON:  07/06/2003 FINDINGS: Brain: There is a subacute right-sided subdural hematoma measuring up to 2.6 cm in maximal thickness. The majority of the subdural collection is decreased in attenuation, with scattered dependent higher attenuation more acute blood products identified posteriorly. There is significant mass effect and leftward midline shift, measuring approximately 1.7 cm at the level of the septum pellucidum. No evidence of acute infarct. Effacement of the lateral ventricles due to mass effect. Remaining midline structures are unremarkable. Vascular: No hyperdense vessel or unexpected calcification. Skull: Normal. Negative for fracture or focal lesion. Sinuses/Orbits: No acute finding. Invagination of the left lamina papyracea consistent with chronic injury. Other: None. IMPRESSION: 1. Subacute right-sided subdural hematoma with significant mass effect and leftward midline shift measuring 1.7 cm. 2. No acute infarct. Critical Value/emergent results were called by telephone at the time of interpretation on 10/05/2020 at 5:55 pm to provider JOSEPH ZAMMIT, who verbally acknowledged these results. Electronically Signed   By: Randa Ngo M.D.   On: 10/05/2020 17:59   CT Cervical Spine Wo Contrast  Result Date: 10/05/2020 CLINICAL DATA:  Found down, left-sided weakness EXAM: CT CERVICAL SPINE WITHOUT CONTRAST TECHNIQUE: Multidetector CT imaging of the cervical spine was performed without intravenous contrast.  Multiplanar CT image reconstructions were also generated. COMPARISON:  None. FINDINGS: Alignment: Alignment is anatomic. Skull base and vertebrae: No acute fracture. No primary bone lesion or focal  pathologic process. Soft tissues and spinal canal: No prevertebral fluid or swelling. No visible canal hematoma. Prominent atherosclerosis at the carotid bifurcations. Disc levels:  Mild spondylosis at C5-6 and C6-7. Upper chest: Central airway is patent.  Lung apices are clear. Other: Reconstructed images demonstrate no additional findings. IMPRESSION: 1. No acute cervical spine fracture. Electronically Signed   By: Randa Ngo M.D.   On: 10/05/2020 17:58   DG Chest Portable 1 View  Result Date: 10/05/2020 CLINICAL DATA:  New left arm and leg weakness, coarse breath sounds EXAM: PORTABLE CHEST 1 VIEW COMPARISON:  Chest radiograph 06/15/2015 FINDINGS: The heart is borderline enlarged. The mediastinal contours are within normal limits. The lungs are well inflated. There is no focal consolidation or pulmonary edema. There is no pleural effusion or pneumothorax. There is no acute osseous abnormality. IMPRESSION: Borderline cardiomegaly. Otherwise, no radiographic evidence of acute cardiopulmonary process. Electronically Signed   By: Valetta Mole M.D.   On: 10/05/2020 16:43   DG Cerv Spine Flex&Ext Only  Result Date: 10/06/2020 CLINICAL DATA:  Injury EXAM: CERVICAL SPINE - FLEXION AND EXTENSION VIEWS ONLY COMPARISON:  None. FINDINGS: C1-C6 are well visualized on lateral view. Vertebral body heights are well-maintained. Disc spaces are well-maintained. No evidence of instability on flexion-extension views. Soft tissues are unremarkable. IMPRESSION: No evidence of instability on flexion and extension views. Electronically Signed   By: Yetta Glassman M.D.   On: 10/06/2020 13:22   VAS [place] LOWER EXTREMITY VENOUS (DVT)  Result Date: 10/19/2020  Lower Venous DVT Study Patient Name:  EVA VALLEE  Date of Exam:    10/18/2020 Medical Rec #: [id number]         Accession #:    [id number] Date of Birth: 01-07-1956         Patient Gender: M Patient Age:   68 years Exam Location:  [facility] Procedure:      VAS [place] LOWER EXTREMITY VENOUS (DVT) Referring Phys: Lauraine Rinne --------------------------------------------------------------------------------  Indications: Swelling, and Edema.  Performing Technologist: Archie Patten RVS  Examination Guidelines: A complete evaluation includes B-mode imaging, spectral Doppler, color Doppler, and power Doppler as needed of all accessible portions of each vessel. Bilateral testing is considered an integral part of a complete examination. Limited examinations for reoccurring indications may be performed as noted. The reflux portion of the exam is performed with the patient in reverse Trendelenburg.  +---------+---------------+---------+-----------+----------+--------------+ RIGHT    CompressibilityPhasicitySpontaneityPropertiesThrombus Aging +---------+---------------+---------+-----------+----------+--------------+ CFV      Full           Yes      Yes                                 +---------+---------------+---------+-----------+----------+--------------+ SFJ      Full                                                        +---------+---------------+---------+-----------+----------+--------------+ FV Prox  Full                                                        +---------+---------------+---------+-----------+----------+--------------+ FV  Mid   Full                                                        +---------+---------------+---------+-----------+----------+--------------+ FV DistalFull                                                        +---------+---------------+---------+-----------+----------+--------------+ PFV      Full                                                         +---------+---------------+---------+-----------+----------+--------------+ POP      Full           Yes      Yes                                 +---------+---------------+---------+-----------+----------+--------------+ PTV      Full                                                        +---------+---------------+---------+-----------+----------+--------------+ PERO     Full                                                        +---------+---------------+---------+-----------+----------+--------------+   +---------+---------------+---------+-----------+----------+--------------+ LEFT     CompressibilityPhasicitySpontaneityPropertiesThrombus Aging +---------+---------------+---------+-----------+----------+--------------+ CFV      Full           Yes      Yes                                 +---------+---------------+---------+-----------+----------+--------------+ SFJ      Full                                                        +---------+---------------+---------+-----------+----------+--------------+ FV Prox  Full                                                        +---------+---------------+---------+-----------+----------+--------------+ FV Mid   Full                                                        +---------+---------------+---------+-----------+----------+--------------+  FV DistalFull                                                        +---------+---------------+---------+-----------+----------+--------------+ PFV      Full                                                        +---------+---------------+---------+-----------+----------+--------------+ POP      Full           Yes      Yes                                 +---------+---------------+---------+-----------+----------+--------------+ PTV      Full                                                         +---------+---------------+---------+-----------+----------+--------------+ PERO     Full                                                        +---------+---------------+---------+-----------+----------+--------------+     Summary: BILATERAL: - No evidence of deep vein thrombosis seen in the lower extremities, bilaterally. -No evidence of popliteal cyst, bilaterally.   *See table(s) above for measurements and observations. Electronically signed by Harold Barban MD on 10/19/2020 at 9:22:29 PM.    Final     Labs:  Basic Metabolic Panel: No results for input(s): NA, K, CL, CO2, GLUCOSE, BUN, CREATININE, CALCIUM, MG, PHOS in the last 168 hours.   CBC: No results for input(s): WBC, NEUTROABS, HGB, HCT, MCV, PLT in the last 168 hours.   CBG: Recent Labs  Lab 10/25/20 1701 10/25/20 2158 10/26/20 0613 10/26/20 1213 10/26/20 2137  GLUCAP 118* 130* 127* 112* 101*   Family history.  Father with diabetes CAD and hypertension.  Sister with diabetes.  Negative for colon cancer stomach cancer rectal cancer  Brief HPI:   Christopher Chapman is a 65 y.o. right-handed male with history of hypertension prediabetes gout hyperlipidemia, chronic anemia, polysubstance as well as tobacco abuse, medical noncompliance end-stage renal disease secondary to hypertension on hemodialysis for 5 years before receiving a decreased donor renal transplantation [facility] 09/13/2016.  Per chart review lives with elderly mother.  Independent prior to admission.  Presented 10/05/2020 after being found down for unknown amount of time.  Admission chemistries hemoglobin 9.6 potassium 5.4 BUN 102 creatinine 17.95 CK5 111 ammonia level 37 troponin 66 hemoglobin A1c 5.6.  Cranial CT scan showed subacute right-sided subdural hematoma with significant mass-effect and leftward midline shift measuring 1.7 cm.  CT cervical spine negative.  Underwent right craniotomy for evacuation of subdural hematoma 10/05/2020 per Dr. Sherley Bounds.   Maintained on Keppra for seizure prophylaxis.  Renal service follow-up placed on hemodialysis progressive  for decline in renal function.  Hospital course follow-up cranial CT scan showed increasing right to left midline shift with increased effacement of the lateral ventricles underwent subdural drain placement for recurrent right chronic subdural hematoma 10/14/2020 per Dr. Sherley Bounds.  Hemodialysis continued as recommended.  Therapy evaluations completed due to patient decreased functional mobility was admitted for a comprehensive rehab program.   Hospital Course: ADOLPHO MEENACH was admitted to rehab 10/17/2020 for inpatient therapies to consist of PT, ST and OT at least three hours five days a week. Past admission physiatrist, therapy team and rehab RN have worked together to provide customized collaborative inpatient rehab.  Pertaining to patient's traumatic SDH status postevacuation of subdural hematoma 1/61/0960 complicated by reaccumulation of SDH with subdural drain placement 10/14/2020.  Drains discontinued 10/16/2020.  Venous Doppler studies negative SCDs for DVT prophylaxis.  Pain managed with hydrocodone as needed.  Initially maintained on Keppra for seizure prophylaxis no seizure activity.  End-stage renal disease hemodialysis ongoing outpatient as recommended per renal services.  Acute on chronic anemia Aranesp as advised no bleeding episodes.  Blood pressure controlled on Norvasc labetalol as well as Cozaar and monitored.  Prediabetes hemoglobin A1c 5.6.  History of polysubstance abuse states he had been using some alcohol tobacco but no other substances and receive counts regards to maintaining cessation of any illicit drug products.   Blood pressures were monitored on TID basis and soft and monitored  Diabetes has been monitored with ac/hs CBG checks and SSI was use prn for tighter BS control.    Rehab course: During patient's stay in rehab weekly team conferences were held to monitor  patient's progress, set goals and discuss barriers to discharge. At admission, patient required moderate assist stand pivot transfers minimal guard 20 feet rolling walker supervision supine to sit  Physical exam.  Blood pressure 134/68 pulse 68 temperature 98 respiration 17 oxygen saturation 98% room air Constitutional.  No acute distress HEENT.  Right parietal craniotomy site clean and dry Neurologic.  Alert sitting up in bed makes eye contact with examiner provides name and age.  Limited awareness of deficits. Eyes.  Pupils round and reactive to light without nystagmus Neck.  Supple nontender no JVD without thyromegaly Cardiac regular rate rhythm any extra sounds or murmur heard Abdomen.  Soft nontender positive bowel sounds without rebound Respiratory effort normal no respiratory distress without wheeze Skin.  Warm and dry Neurologic/musculoskeletal.  Cranial nerves II through XII intact motor strength 5/5 bilateral deltoid bicep tricep grip hip flexors knee extensors ankle dorsi plantarflexion.  Sensation intact.  He/She  has had improvement in activity tolerance, balance, postural control as well as ability to compensate for deficits. He/She has had improvement in functional use RUE/LUE  and RLE/LLE as well as improvement in awareness.  Patient able to come to sit edge of bed supervision transfers wheelchair stand pivot contact-guard.  Propels himself in the wheelchair independently ambulates 100 feet without assistive device.  Patient quite hyperverbal poor safety awareness and impulsive.  Bed mobility modified independent ambulates into the bathroom without assistive device contact-guard bed to chair and sink and toilet.  Completes bathing sitting and stands at sink with supervision.  Speech therapy facilitated sessions by providing max assist verbal cues for recall of current medications and their functions.  Again stressed the need for safety.  Full family teaching completed plan discharged to  home       Disposition: Discharged home   Diet: Renal diet  Special Instructions: No driving smoking  or alcohol  Continue hemodialysis as directed  Hold aspirin until further notice due to subdural hematoma  Medications at discharge 1.  Tylenol as needed 2.  Zyloprim 200 mg p.o. daily 3.  Norvasc 5 mg p.o. daily 4.  Lipitor 40 mg p.o. daily 5.  Colace 100 mg twice daily as needed constipation 6.  Hydrocodone 1 tablet every 4 hours as needed pain 7.  Labetalol 300 mg p.o. 3 times daily 8.  Cozaar 25 mg p.o. daily 9.  Protonix 40 mg p.o. nightly 10.  Renvela 800 mg p.o. 3 times daily 11.  Prograf 2 mg p.o. twice daily 12.  Myfortic 360 mg twice daily  30-35 minutes were spent completing discharge summary and discharge planning  Discharge Instructions     Ambulatory referral to Occupational Therapy   Complete by: As directed    Evaluate and treat   Ambulatory referral to Physical Medicine Rehab   Complete by: As directed    Moderate complexity follow-up 1 to 2 weeks traumatic SDH   Ambulatory referral to Physical Therapy   Complete by: As directed    Evaluate and treat   Ambulatory referral to Speech Therapy   Complete by: As directed    Evaluate and treat        Follow-up Information     Meredith Staggers, MD Follow up.   Specialty: Physical Medicine and Rehabilitation Why: office to call for appointment Contact information: [address] [place] [address] [phone]         [name], MD Follow up.   Specialty: Neurosurgery Why: Call for appointment Contact information: [address] [place] [address] [place] [address] [phone]         Dwana Melena, MD Follow up.   Specialty: Nephrology Why: Call for appointment Contact information: [address] [place] [address] [phone]                 Signed: Lavon Paganini [place] 10/27/2020, 5:53 AM

## 2020-10-26 ENCOUNTER — Other Ambulatory Visit (HOSPITAL_COMMUNITY): Payer: Self-pay

## 2020-10-26 LAB — GLUCOSE, CAPILLARY
Glucose-Capillary: 127 mg/dL — ABNORMAL HIGH (ref 70–99)
Glucose-Capillary: 112 mg/dL — ABNORMAL HIGH (ref 70–99)
Glucose-Capillary: 101 mg/dL — ABNORMAL HIGH (ref 70–99)

## 2020-10-26 MED ORDER — LOSARTAN POTASSIUM 25 MG PO TABS
25.0000 mg | ORAL_TABLET | Freq: Every day | ORAL | 0 refills | Status: DC
  Filled 2020-10-26: qty 30, 30d supply, fill #0

## 2020-10-26 MED ORDER — ACCU-CHEK GUIDE W/DEVICE KIT
PACK | 0 refills | Status: AC
  Filled 2020-10-26: qty 1, 30d supply, fill #0

## 2020-10-26 MED ORDER — ACETAMINOPHEN 325 MG PO TABS: 650.0000 mg | ORAL_TABLET | ORAL | Status: AC | PRN

## 2020-10-26 MED ORDER — ACCU-CHEK SOFTCLIX LANCETS MISC
100.0000 | 12 refills | Status: AC | PRN
  Filled 2020-10-26: qty 100, 25d supply, fill #0

## 2020-10-26 MED ORDER — ALLOPURINOL 100 MG PO TABS
200.0000 mg | ORAL_TABLET | Freq: Every day | ORAL | 0 refills | Status: DC
  Filled 2020-10-26: qty 60, 30d supply, fill #0

## 2020-10-26 MED ORDER — GLUCOSE BLOOD VI STRP
ORAL_STRIP | 0 refills | Status: AC
  Filled 2020-10-26: qty 100, 30d supply, fill #0

## 2020-10-26 MED ORDER — LABETALOL HCL 300 MG PO TABS
300.0000 mg | ORAL_TABLET | Freq: Three times a day (TID) | ORAL | 0 refills | Status: DC
  Filled 2020-10-26: qty 90, 30d supply, fill #0

## 2020-10-26 MED ORDER — TACROLIMUS 1 MG PO CAPS
2.0000 mg | ORAL_CAPSULE | Freq: Two times a day (BID) | ORAL | 0 refills | Status: DC
  Filled 2020-10-26: qty 120, 30d supply, fill #0

## 2020-10-26 MED ORDER — ATORVASTATIN CALCIUM 40 MG PO TABS
40.0000 mg | ORAL_TABLET | Freq: Every day | ORAL | 0 refills | Status: AC
  Filled 2020-10-26: qty 30, 30d supply, fill #0

## 2020-10-26 MED ORDER — SEVELAMER CARBONATE 800 MG PO TABS
800.0000 mg | ORAL_TABLET | Freq: Three times a day (TID) | ORAL | 0 refills | Status: DC
  Filled 2020-10-26: qty 90, 30d supply, fill #0

## 2020-10-26 MED ORDER — CALCITRIOL 0.5 MCG PO CAPS
0.5000 ug | ORAL_CAPSULE | ORAL | 0 refills | Status: AC
  Filled 2020-10-26: qty 30, 70d supply, fill #0

## 2020-10-26 MED ORDER — AMLODIPINE BESYLATE 5 MG PO TABS
5.0000 mg | ORAL_TABLET | Freq: Every day | ORAL | 0 refills | Status: DC
  Filled 2020-10-26: qty 30, 30d supply, fill #0

## 2020-10-26 MED ORDER — PANTOPRAZOLE SODIUM 40 MG PO TBEC
40.0000 mg | DELAYED_RELEASE_TABLET | Freq: Every day | ORAL | 0 refills | Status: AC
  Filled 2020-10-26: qty 30, 30d supply, fill #0

## 2020-10-26 MED ORDER — HYDROCODONE-ACETAMINOPHEN 5-325 MG PO TABS
1.0000 | ORAL_TABLET | ORAL | 0 refills | Status: AC | PRN
  Filled 2020-10-26: qty 30, 5d supply, fill #0

## 2020-10-26 NOTE — Progress Notes (Signed)
Physical Therapy Discharge Summary  Patient Details  Name: Christopher Chapman MRN: [id number] Date of Birth: Feb 16, 1955  Today's Date: 10/26/2020 PT Individual Time: 1105-1204 PT Individual Time Calculation (min): 59 min    Patient has met 7 of 8 long term goals due to improved activity tolerance, improved balance, improved postural control, increased strength, ability to compensate for deficits, improved attention, improved awareness, and improved coordination.  Patient to discharge at an ambulatory level, no AD, with Supervision and completing transfers independently.  Patient's care partner is unavailable to provide the necessary physical and cognitive assistance at discharge; however, are aware of recommendation for 24hr support due to cognitive impairments. Family unable to attend in-person education therefore provided with written education from therapy team.  Reasons goals not met: Pt requires supervision for safety with higher level dynamic standing balance tasks.   Recommendation:  Patient will benefit from ongoing skilled PT services in outpatient setting to continue to advance safe functional mobility, address ongoing impairments in high level dynamic standing balance and high level dynamic gait training, cardiovascular endurance training, and minimize fall risk.  Equipment: No equipment provided, none needed  Reasons for discharge: treatment goals met and discharge from hospital  Patient/family agrees with progress made and goals achieved: Yes  Skilled Therapeutic Interventions/Progress Updates:  Pt received supine in bed with provider education pt on dialysis schedule. Once complete, pt agreeable to therapy session. Supine<>sitting in bed independently. Sit<>stands, no AD, independently throughout session. Gait training ~194ft to ortho gym, no AD, with distant supervision. Simulated ambulatory car transfer (small SUV height) with supervision. Pt reports need to use bathroom. Gait  ~176ft 2x to/from room, no AD, with distant supervision - continues to demo slow gait speed with wider BOS. Pt completed all toileting transfer, LB clothing management, and peri-care independently - reports he feels that he isn't completely emptying his bowels when he voids. Standing hand hygiene at sink independently. Ascended/descended 12 steps in hallway stairwell using R HR only to replicate home environment with supervision - self selected reciprocal stepping pattern on ascent then step-to leading with R LE progressed to reciprocal on descent - no instances of instability. Gait training ~1ft up/down ramp, over ~49ft of mulch 2x, and on/off curb with supervision for safety but no significant instability noted. Participated in 6-Minute Walk Test without AD reaching 631ft total without any standing rest breaks and supervision for safety though no significant instability noted with average gait speed of 0.72m/s. Educated pt on need to call 911 in event of a fall but did educate pt on safe floor transfer technique - pt demonstrated with CGA for safety. Pt reports no questions/concerns regarding D/C and is able to self-recognize his impaired endurance. Educated pt that recommendation is not to use a RW upon D/C because he has demonstrated poor AD management in prior therapy sessions actually placing him at higher risk for falling by getting tangled up in the AD. At end of session, pt left supine in bed with needs in reach and bed alarm on.   PT Discharge Precautions/Restrictions Precautions Precautions: Fall Precaution Comments: staples on cranium Restrictions Weight Bearing Restrictions: No Pain Pain Assessment Pain Scale: 0-10 Pain Score: 0-No pain Pain Interference Pain Interference Pain Effect on Sleep: 1. Rarely or not at all Pain Interference with Therapy Activities: 1. Rarely or not at all Pain Interference with Day-to-Day Activities: 1. Rarely or not at all Vision/Perception  Vision -  History Ability to See in Adequate Light: 0 Adequate Perception Perception: Within Functional  Limits Praxis Praxis: Intact  Cognition Overall Cognitive Status: Impaired/Different from baseline Arousal/Alertness: Awake/alert Orientation Level: Oriented X4 Year: 2022 Month: October Day of Week: Correct Attention: Focused;Sustained Focused Attention: Appears intact Sustained Attention: Appears intact Sustained Attention Impairment: Functional complex;Verbal complex Memory: Impaired (although improved from eval) Awareness: Impaired Safety/Judgment: Impaired Sensation Sensation Light Touch: Appears Intact Hot/Cold: Appears Intact (could tell temp difference in shower) Proprioception: Appears Intact Stereognosis: Not tested Coordination Gross Motor Movements are Fluid and Coordinated: Yes Fine Motor Movements are Fluid and Coordinated: Yes (WFL) Coordination and Movement Description: GM movements are WFL due to improved B LE strength and coordination as well as standing balance Finger Nose Finger Test: mild dysmetria bilaterally Heel Shin Test: WNL and symmetrical 9 Hole Peg Test: NT Motor  Motor Motor: Other (comment) Motor - Discharge Observations: continues to have high level dynamic balance/gait impairments; however, improved from eval. Improved motor planning/sequencing  Mobility Bed Mobility Bed Mobility: Rolling Right;Rolling Left;Sit to Supine;Supine to Sit Rolling Right: Independent Rolling Left: Independent Supine to Sit: Independent Sit to Supine: Independent Transfers Transfers: Sit to Stand;Stand Pivot Transfers;Stand to Sit Sit to Stand: Independent Stand to Sit: Independent Stand Pivot Transfers: Independent Transfer (Assistive device): None Locomotion  Gait Ambulation: Yes Gait Assistance: Supervision/Verbal cueing Gait Distance (Feet): 650 Feet Assistive device: None Gait Gait: Yes Gait Pattern: Impaired Gait Pattern: Wide base of  support;Step-through pattern;Poor foot clearance - left;Poor foot clearance - right Gait velocity: 0.38m/s to 0.70m/s Stairs / Additional Locomotion Stairs: Yes Stairs Assistance: Supervision/Verbal cueing Stair Management Technique: One rail Right Number of Stairs: 12 Height of Stairs: 6 Ramp: Supervision/Verbal cueing Curb: Supervision/Verbal cueing Wheelchair Mobility Wheelchair Mobility: No  Trunk/Postural Assessment  Cervical Assessment Cervical Assessment: Within Functional Limits Thoracic Assessment Thoracic Assessment: Within Functional Limits Lumbar Assessment Lumbar Assessment: Within Functional Limits  Balance Balance Balance Assessed: Yes Static Sitting Balance Static Sitting - Balance Support: Feet unsupported Static Sitting - Level of Assistance: 7: Independent Dynamic Sitting Balance Dynamic Sitting - Balance Support: Feet supported;No upper extremity supported Dynamic Sitting - Level of Assistance: 6: Modified independent (Device/Increase time) Static Standing Balance Static Standing - Balance Support: During functional activity Static Standing - Level of Assistance: 7: Independent Dynamic Standing Balance Dynamic Standing - Balance Support: No upper extremity supported;During functional activity Dynamic Standing - Level of Assistance: 7: Independent;5: Stand by assistance Extremity Assessment      RLE Assessment RLE Assessment: Exceptions to [facility] Active Range of Motion (AROM) Comments: WFL/WNL RLE Strength Right Hip Flexion: 4-/5 Right Knee Flexion: 4/5 Right Knee Extension: 4/5 Right Ankle Dorsiflexion: 4-/5 Right Ankle Plantar Flexion: 4-/5 RLE Tone RLE Tone Comments: + for clonus LLE Assessment LLE Assessment: Exceptions to [facility] Active Range of Motion (AROM) Comments: WFL/WNL General Strength Comments: assessed in sitting LLE Strength Left Hip Flexion: 4/5 Left Knee Flexion: 4+/5 Left Knee Extension: 5/5 Left Ankle Dorsiflexion: 4/5 Left Ankle  Plantar Flexion: 4-/5 LLE Tone LLE Tone Comments: + for clonus    Tawana Scale , PT, DPT, NCS, CSRS 10/26/2020, 7:48 AM

## 2020-10-26 NOTE — Progress Notes (Signed)
Met with pt at bedside to discuss in detail again out-pt HD arrangements. Pt aware that he will complete paperwork tomorrow at clinic on way home from hospital and will start on Friday. Pt aware of MWF schedule. Pt aware he needs to arrive at 5:40 for 6:00 chair time. Pt voices understanding and has no questions or concerns at this time. Pt provided schedule letter with all out-pt HD arrangements documented. Contacted clinic regarding above plans. [facility] provider aware of need for orders for treatment on Friday. Will follow and assist.    Melven Sartorius Renal Navigator [phone]

## 2020-10-26 NOTE — Progress Notes (Signed)
Occupational Therapy Discharge Summary  Patient Details  Name: Christopher Chapman MRN: [id number] Date of Birth: 24-Sep-1955  Today's Date: 10/26/2020 OT Individual Time: 5427-0623 OT Individual Time Calculation (min): 75 min    Skilled OT Session: Pt received in bed with no c/o pain & agreeable to OT session. Pt with more appropriate communication. Still talkative, but not as hyperverbal. Was able to execute dual tasking throughout tx as evidenced by ability to carry on conversation while performing BADLs. Pt did not require cuing for safety during tx.   ADL: Pt completes BADL at overall close(S)/ modified I level. Skilled interventions include: Pt performs mobility from bed to shower at modified I level. Pt verbalized that his staples were coming out today. Pt able to gather items needed for shower with no additional cuing. He verbalized that he needed soap, washcloths and towels. Stated he had to use the commode, and had audible BM + urine(charted in flowsheets). Sat to doff remove pants over feet without prompting from OTS. Showering tasks performed in standing/sitting in a safe manner while holding conversation. Pt reports feeling "lightheaded" when standing up too quickly. Verbalized that he is aware that he should move slower when transitioning from sitting > standing. Pt with sudden c/o feeling "lightheaded & woozy" sitting in shower. BP assessed at 104/63 (77). Pt began stating that he felt like he was going to pass out. Quickly transitioned from shower > WC > supine in bed. Pt able to complete mobility with cuing despite reports of not feeling well. BP assessed again supine at 111/70. Pt with no further reports of lightheadedness after ~3 min rest. Finished dressing EOB with close (S). Pt left supine in bed with exit alarm on, call light in reach and all needs met.   General mobility- When getting up, they should push up from the surface they are getting up from and reach back when sitting to  a new surface, no plopping.  You are their "shadow." Especially in the beginning. You, as the helper should be in reach of the patient when mobilizing. You should be either beside or behind them so if they lose their balance you can assist by helping correct at the hips. This is likely closer than you are used to being- be in their personal space. Use a gait belt if that makes you feel more comfortable If you are attempting to get up/transfer and it is not going well, reset. Have them sit back down. Make sure they are close to the edge of the seat, feet are underneath them at hips distance, and they are leaning forward to stand up. Bathing- they should sit to bathe on a shower chair, especially for washing legs/feet. Sitting will save energy and increase safety. Dressing- all should be done from a SEATED level, especially to put underwear and pants over feet.  Toileting- Before they stand to pull up pants past hips they should pull pants/underwear up past their knees to decrease the need to bend forward to the floor. Sometimes this makes people dizzy if incontinence/bathroom accidents are an issue attempt to toilet every 2-3 hours to improve success with toileting and decrease accidents.  Energy conservation principles- Prioritize what needs to be done and what can be moved to another day Plan out their days, weeks, months to spread out taxing (physical or cognitively tiring) activities to not put too much at one time Pace activities- rest before feeling tired and have designated places to rest if they feel tired and need  to take a break Position for success: sit when able to conserve 25% more energy than standing   Discharge Summary: Pt has improved from MIN A at admission to CIR to overall supervision - modified independent level. 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 Pt verbalized understanding that he cannot drive until MD  clears him to. Pt with significant improvement on eval and is consistently oriented to situation, time and place. Pt still has safety awareness deficits, but also improved from initial eval. Family members/caregivers not present for education sessions. 24/7 supervision is recommended d/t deficits in higher level BADL/IADL functional tasks.   Patient has met 11 of 11 long term goals due to improved activity tolerance, improved balance, postural control, improved attention, improved awareness, and improved coordination.  Patient to discharge at overall supervision/Modified Independent level.  Patient's care partner unavailable for family education. Handouts and supervision level information provided for family at d/c. Pt aware he is not to be driving and requires supervision for higher level tasks.   Reasons goals not met: N/A Recommendation:  Patient will benefit from ongoing skilled OT services in outpatient setting to continue to advance functional skills in the area of BADL and iADL.  Equipment: N/A  Reasons for discharge: treatment goals met and discharge from hospital  Patient/family agrees with progress made and goals achieved: Yes  OT Discharge Precautions/Restrictions  Precautions Precautions: Fall Precaution Comments: staples on cranium Restrictions Weight Bearing Restrictions: No  Pain Pain Assessment Pain Scale: 0-10 Pain Score: 0-No pain ADL ADL Eating: Set up Where Assessed-Eating: Bed level Grooming: Modified independent Where Assessed-Grooming: Standing at sink Upper Body Bathing: Modified independent Where Assessed-Upper Body Bathing: Shower Lower Body Bathing: Modified independent Where Assessed-Lower Body Bathing: Shower Upper Body Dressing: Supervision/safety Where Assessed-Upper Body Dressing: Edge of bed Lower Body Dressing: Supervision/safety Where Assessed-Lower Body Dressing: Edge of bed Toileting: Modified independent Where Assessed-Toileting:  Glass blower/designer: Diplomatic Services operational officer Method: Counselling psychologist: Energy manager: Modified independent Clinical cytogeneticist Method: Optometrist: Radio broadcast assistant, Energy manager: Modified independent Social research officer, government Method: Heritage manager: Radio broadcast assistant, Grab bars [employer]: Within Passenger transport manager Range of Motion: Within Functional Limits Alignment/Gaze Preference: Within Defined Limits Tracking/Visual Pursuits: Able to track stimulus in all quads without difficulty Saccades: Within functional limits Perception  Perception: Within Functional Limits Praxis Praxis: Intact Cognition Arousal/Alertness: Awake/alert Orientation Level: Oriented X4 Year: 2022 Month: October Day of Week: Correct Attention: Sustained Sustained Attention: Appears intact Sustained Attention Impairment: Functional complex;Verbal complex Memory: Impaired (improved from eval, pt still reports deficits) Memory Impairment: Decreased recall of new information Immediate Memory Recall: Sock;Blue;Bed Memory Recall Sock: Without Cue Memory Recall Blue: Without Cue Memory Recall Bed: Without Cue Awareness: Impaired Awareness Impairment: Intellectual impairment Problem Solving: Impaired Problem Solving Impairment: Functional complex Behaviors: Impulsive Safety/Judgment: Impaired (improved from eval) Sensation Sensation Light Touch: Appears Intact Hot/Cold: Appears Intact (could tell temp difference in shower) Proprioception: Appears Intact Stereognosis: Not tested Coordination Gross Motor Movements are Fluid and Coordinated: Yes Fine Motor Movements are Fluid and Coordinated: Yes (WFL) Coordination and Movement Description: continues to have higher level functional impairments for balance/coordination  Finger Nose Finger Test: mild dysmetria bilaterally Heel  Shin Test: WNL and symmetrical 9 Hole Peg Test: NT Motor  Motor Motor: Abnormal postural alignment and control Motor - Discharge Observations:  (improved motor planning from eval, balance improved  for ADLs from eval) Mobility  Bed Mobility Bed Mobility: Rolling Right;Rolling Left;Sit to Supine;Supine to Sit Rolling Right: Independent with assistive device Rolling Left: Independent with assistive device Supine to Sit: Independent with assistive device Sit to Supine: Independent with assistive device Transfers Sit to Stand: Independent with assistive device Stand to Sit: Independent with assistive device  Trunk/Postural Assessment  Cervical Assessment Cervical Assessment: Within Functional Limits Thoracic Assessment Thoracic Assessment: Within Functional Limits Lumbar Assessment Lumbar Assessment: Within Functional Limits  Balance Balance Balance Assessed: Yes Static Sitting Balance Static Sitting - Balance Support: Feet unsupported Static Sitting - Level of Assistance: 6: Modified independent (Device/Increase time) Dynamic Sitting Balance Dynamic Sitting - Balance Support: Feet supported;No upper extremity supported Dynamic Sitting - Level of Assistance: 6: Modified independent (Device/Increase time) Static Standing Balance Static Standing - Balance Support: No upper extremity supported Static Standing - Level of Assistance: 6: Modified independent (Device/Increase time) Dynamic Standing Balance Dynamic Standing - Balance Support: No upper extremity supported;During functional activity Dynamic Standing - Level of Assistance: 6: Modified independent (Device/Increase time) Extremity/Trunk Assessment RUE Assessment RUE Assessment: Within Functional Limits LUE Assessment LUE Assessment: Within Functional Limits  Agitated Behavior Scale: TBI Observation Details Observation Environment: pts room Start of observation period - Date: 10/26/20 Start of observation period - Time:  0700 End of observation period - Date: 10/26/20 End of observation period - Time: 0815 Agitated Behavior Scale (DO NOT LEAVE BLANKS) Short attention span, easy distractibility, inability to concentrate: Present to a slight degree Impulsive, impatient, low tolerance for pain or frustration: Absent Uncooperative, resistant to care, demanding: Absent Violent and/or threatening violence toward people or property: Absent Explosive and/or unpredictable anger: Absent Rocking, rubbing, moaning, or other self-stimulating behavior: Absent Pulling at tubes, restraints, etc.: Absent Wandering from treatment areas: Absent Restlessness, pacing, excessive movement: Absent Repetitive behaviors, motor, and/or verbal: Present to a slight degree Rapid, loud, or excessive talking: Present to a slight degree Sudden changes of mood: Absent Easily initiated or excessive crying and/or laughter: Absent Self-abusiveness, physical and/or verbal: Absent Agitated behavior scale total score: 17  Macklin Jacquin 10/26/2020, 12:27 PM

## 2020-10-26 NOTE — Procedures (Signed)
I was present at this dialysis session. I have reviewed the session itself and made appropriate changes.   In good spirits. 2L UF goal. 2K bath. For DC tomorrow.   Filed Weights   10/25/20 0500 10/26/20 0700 10/26/20 1259  Weight: 96.6 kg 97 kg 96.2 kg    No results for input(s): NA, K, CL, CO2, GLUCOSE, BUN, CREATININE, CALCIUM, PHOS in the last 168 hours.  Invalid input(s): ALB  No results for input(s): WBC, NEUTROABS, HGB, HCT, MCV, PLT in the last 168 hours.  Scheduled Meds:  allopurinol  200 mg Oral Daily   amLODipine  5 mg Oral Daily   atorvastatin  40 mg Oral Daily   calcitRIOL  0.5 mcg Oral Q M,W,F-HD   Chlorhexidine Gluconate Cloth  6 each Topical Q0600   darbepoetin (ARANESP) injection - DIALYSIS  60 mcg Intravenous Q Mon-HD   insulin aspart  0-15 Units Subcutaneous TID WC   labetalol  300 mg Oral TID   levETIRAcetam  250 mg Oral BID   losartan  25 mg Oral Daily   pantoprazole  40 mg Oral QHS   senna  1 tablet Oral BID   sevelamer carbonate  800 mg Oral TID WC   tacrolimus  2 mg Oral BID   Continuous Infusions: PRN Meds:.acetaminophen **OR** acetaminophen, docusate sodium, HYDROcodone-acetaminophen, ondansetron **OR** ondansetron (ZOFRAN) IV   Pearson Grippe  MD 10/26/2020, 1:31 PM

## 2020-10-26 NOTE — Progress Notes (Addendum)
PROGRESS NOTE   Subjective/Complaints: Patient seen laying in bed this AM.  He states he slept well overnight.  He is in good spirits and remembers me. He is looking forward to discharge tomorrow.  ROS: Denies CP, SOB, N/V/D  Objective:   No results found. No results for input(s): WBC, HGB, HCT, PLT in the last 72 hours.  No results for input(s): NA, K, CL, CO2, GLUCOSE, BUN, CREATININE, CALCIUM in the last 72 hours.   Intake/Output Summary (Last 24 hours) at 10/26/2020 1129 Last data filed at 10/26/2020 0800 Gross per 24 hour  Intake 1200 ml  Output 1850 ml  Net -650 ml         Physical Exam: Vital Signs Blood pressure (!) 141/73, pulse 65, temperature 98.5 F (36.9 C), temperature source Oral, resp. rate 14, height 6\' 1"  (1.854 m), weight 97 kg, SpO2 100 %. Constitutional: No distress . Vital signs reviewed. HENT: Normocephalic.  +Crani incision.  Eyes: EOMI. No discharge. Cardiovascular: No JVD.  RRR. Respiratory: Normal effort.  No stridor.  Bilateral clear to auscultation. GI: Non-distended.  BS +. Skin: Warm and dry.  Staples CDI. Psych: Normal mood.  Some impulsivity. Musc: No edema in extremities.  No tenderness in extremities. Neurologic: Alert and oriented, except for date of month Motor: 5/5 throughout  Assessment/Plan: 1. Functional deficits which require 3+ hours per day of interdisciplinary therapy in a comprehensive inpatient rehab setting. Physiatrist is providing close team supervision and 24 hour management of active medical problems listed below. Physiatrist and rehab team continue to assess barriers to discharge/monitor patient progress toward functional and medical goals  Care Tool:  Bathing    Body parts bathed by patient: Right arm, Right lower leg, Left lower leg, Left arm, Chest, Abdomen, Face, Front perineal area, Buttocks, Right upper leg, Left upper leg   Body parts bathed by  helper: Right lower leg, Left lower leg Body parts n/a: Right lower leg, Left lower leg   Bathing assist Assist Level: Supervision/Verbal cueing     Upper Body Dressing/Undressing Upper body dressing   What is the patient wearing?: Pull over shirt    Upper body assist Assist Level: Set up assist    Lower Body Dressing/Undressing Lower body dressing      What is the patient wearing?: Pants     Lower body assist Assist for lower body dressing: Set up assist     Toileting Toileting Toileting Activity did not occur (Clothing management and hygiene only): N/A (no void or bm)  Toileting assist Assist for toileting: Independent with assistive device     Transfers Chair/bed transfer  Transfers assist     Chair/bed transfer assist level: Supervision/Verbal cueing     Locomotion Ambulation   Ambulation assist      Assist level: Supervision/Verbal cueing Assistive device: No Device Max distance: 138ft   Walk 10 feet activity   Assist     Assist level: Supervision/Verbal cueing Assistive device: No Device   Walk 50 feet activity   Assist    Assist level: Supervision/Verbal cueing Assistive device: No Device    Walk 150 feet activity   Assist Walk 150 feet activity did not occur:  Safety/medical concerns (fatigue, weakness, decreased balance/postural control)  Assist level: Supervision/Verbal cueing Assistive device: No Device    Walk 10 feet on uneven surface  activity   Assist Walk 10 feet on uneven surfaces activity did not occur: Safety/medical concerns (fatigue, weakness, decreased balance/postural control)         Wheelchair     Assist Is the patient using a wheelchair?: No Type of Wheelchair: Manual    Wheelchair assist level: Supervision/Verbal cueing Max wheelchair distance: 145ft    Wheelchair 50 feet with 2 turns activity    Assist        Assist Level: Supervision/Verbal cueing   Wheelchair 150 feet activity      Assist      Assist Level: Supervision/Verbal cueing   Blood pressure (!) 141/73, pulse 65, temperature 98.5 F (36.9 C), temperature source Oral, resp. rate 14, height 6\' 1"  (1.854 m), weight 97 kg, SpO2 100 %.  Medical Problem List and Plan: 1.  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  Drains discontinued 10/16/2020 Continue CIR, patient and family education 2.  Antithrombotics: -DVT/anticoagulation: SCDs.   Vascular study negative             -antiplatelet therapy: N/A 3. Pain Management: Hydrocodone as needed  Controlled on 10/19 4. Mood: Provide emotional support             -antipsychotic agents: N/A 5. Neuropsych: This patient is ? fully capable of making decisions on his own behalf. 6. Skin/Wound Care: Remove scalp staples this week 7. Fluids/Electrolytes/Nutrition: encourage PO 8.  Seizure prophylaxis.  Keppra 250 mg twice daily-- no seizure activity should be able to stop prior to d/c from CIR 9.  ESRD with decreased donor renal transplantatiion 09/13/2016.  Continue hemodialysis as directed  -HD after therapies to maximize participation during the day  Appreciate nephro recs 10.  Acute on chronic anemia.  Continue Aranesp  Hemoglobin 7.5 on 10/12, labs with HD  Labs with HD 11.  Hypertension.  Norvasc 5 mg daily, labetalol 300 mg 3 times daily, Cozaar 25 mg daily.      Vitals:   10/25/20 1949 10/26/20 0429  BP: 125/74 (!) 141/73  Pulse: 70 65  Resp: 14 14  Temp: 99.1 F (37.3 C) 98.5 F (36.9 C)  SpO2: 100% 100%   Relatively controlled on 10/19 12.  Prediabetes.  Hemoglobin A1c 5.6.  SSI  Mildly elevated on 10/19 13.  History of polysubstance abuse.  Counseling, states he has being using ETOH and tobacco but not other substances 14.?  STD  10/14 UA +/-, UCX with insignificant growth    -all urine yellow, clear   LOS: 9 days A FACE TO FACE EVALUATION WAS PERFORMED  Christopher Chapman Christopher Chapman 10/26/2020, 11:29 AM

## 2020-10-26 NOTE — Plan of Care (Signed)
  Problem: RH Memory Goal: LTG Patient will demonstrate ability for day to day (SLP) Description: LTG:   Patient will demonstrate ability for day to day recall/carryover during cognitive/linguistic activities with assist  (SLP) Outcome: Not Met (add Reason) Note: Goal not met due to slow progress and inconsistent awareness and/or acceptance of deficits   Problem: RH Awareness Goal: LTG: Patient will demonstrate awareness during functional activites type of (SLP) Description: LTG: Patient will demonstrate awareness during functional activites type of (SLP) Outcome: Not Met (add Reason) Note: Goal not met due to slow progress and inconsistent awareness and/or acceptance of deficits   Problem: RH Problem Solving Goal: LTG Patient will demonstrate problem solving for (SLP) Description: LTG:  Patient will demonstrate problem solving for basic/complex daily situations with cues  (SLP) Outcome: Completed/Met   Problem: RH Memory Goal: LTG Patient will use memory compensatory aids to (SLP) Description: LTG:  Patient will use memory compensatory aids to recall biographical/new, daily complex information with cues (SLP) Outcome: Completed/Met   Problem: RH Attention Goal: LTG Patient will demonstrate this level of attention during functional activites (SLP) Description: LTG:  Patient will will demonstrate this level of attention during functional activites (SLP) Outcome: Completed/Met

## 2020-10-26 NOTE — Plan of Care (Signed)
Care plan completed/met for discharge

## 2020-10-26 NOTE — Progress Notes (Addendum)
Patient ID: Christopher Chapman, male   DOB: [birth year], 65 y.o.   MRN: [id number]  Per EMR, Christopher Chapman navigator updated dialysis center on patient arrival tomorrow.   SW faxed referral to [facility] for outpatient PT/OT/SLP. SW faxed PCS referral to [employer] [phone]). SW sent referral to [employer].   SW faxed PCS referral to [employer] [phone]).  *SW spoke with Jocelyn/rep to confirm referral received.  SW spoke with Holly/RN with [employer]. Pt approved for services at this time. Hours will be adjusted pending nursing evaluation.   SW went by pt room to discuss transportation arrangements, but pt gone for dialysis. SW will discuss with pt prior to discharge.   Loralee Pacas, MSW, [place] Office: [phone] Cell: [phone] Fax: [phone]

## 2020-10-27 ENCOUNTER — Other Ambulatory Visit (HOSPITAL_COMMUNITY): Payer: Self-pay

## 2020-10-27 LAB — GLUCOSE, CAPILLARY: Glucose-Capillary: 99 mg/dL (ref 70–99)

## 2020-10-27 MED ORDER — GLUCOSE BLOOD VI STRP: ORAL_STRIP | 0 refills | Status: DC

## 2020-10-27 MED ORDER — ACCU-CHEK GUIDE W/DEVICE KIT: PACK | 0 refills | Status: AC

## 2020-10-27 NOTE — Progress Notes (Signed)
Pt d/c to home this am. Spoke to Stebbins at [facility] who states clinic has received orders and expecting pt anytime to complete paperwork. Clinic aware pt will start tomorrow. Transportation arranged by CSW on CIR.   Melven Sartorius Renal Navigator [phone]

## 2020-10-27 NOTE — Progress Notes (Signed)
Patient ID: Christopher Chapman, male   DOB: 04/09/1955, 65 y.o.   MRN: [id number]  SW met with pt to review discharge instructions and services put in place. Pt was concerned about not having any clothes to d/c to home with. SW was able to locate clothes from donation for patient.   Loralee Pacas, MSW, [place] Office: [phone] Cell: [phone] Fax: [phone]

## 2020-10-27 NOTE — Progress Notes (Signed)
Patient discharged to home. Transported by [facility].

## 2020-10-27 NOTE — Progress Notes (Signed)
PROGRESS NOTE   Subjective/Complaints: Pt with concerns about going to dialysis center today to sign papers. Only has "paper clothes" in this cold weather  ROS: Patient denies fever, rash, sore throat, blurred vision, nausea, vomiting, diarrhea, cough, shortness of breath or chest pain, joint or back pain, headache, or mood change.   Objective:   No results found. No results for input(s): WBC, HGB, HCT, PLT in the last 72 hours.  No results for input(s): NA, K, CL, CO2, GLUCOSE, BUN, CREATININE, CALCIUM in the last 72 hours.   Intake/Output Summary (Last 24 hours) at 10/27/2020 0928 Last data filed at 10/27/2020 0900 Gross per 24 hour  Intake 780 ml  Output 2350 ml  Net -1570 ml        Physical Exam: Vital Signs Blood pressure 114/78, pulse 70, temperature 97.6 F (36.4 C), temperature source Oral, resp. rate 17, height 6\' 1"  (1.854 m), weight 96 kg, SpO2 96 %. Constitutional: No distress . Vital signs reviewed. HEENT: NCAT, EOMI, oral membranes moist Neck: supple Cardiovascular: RRR without murmur. No JVD    Respiratory/Chest: CTA Bilaterally without wheezes or rales. Normal effort    GI/Abdomen: BS +, non-tender, non-distended Ext: no clubbing, cyanosis, or edema Psych: pleasant and cooperative  Skin: staples still in. Musc: No edema in extremities.  No tenderness in extremities. Neurologic: Alert and oriented, except for date of month Motor: 5/5 throughout  Assessment/Plan: 1. Functional deficits which require 3+ hours per day of interdisciplinary therapy in a comprehensive inpatient rehab setting. Physiatrist is providing close team supervision and 24 hour management of active medical problems listed below. Physiatrist and rehab team continue to assess barriers to discharge/monitor patient progress toward functional and medical goals  Care Tool:  Bathing    Body parts bathed by patient: Right arm, Right  lower leg, Left lower leg, Left arm, Chest, Abdomen, Face, Front perineal area, Buttocks, Right upper leg, Left upper leg   Body parts bathed by helper: Right lower leg, Left lower leg Body parts n/a: Right lower leg, Left lower leg   Bathing assist Assist Level: Independent with assistive device     Upper Body Dressing/Undressing Upper body dressing   What is the patient wearing?: Pull over shirt    Upper body assist Assist Level: Supervision/Verbal cueing    Lower Body Dressing/Undressing Lower body dressing      What is the patient wearing?: Pants     Lower body assist Assist for lower body dressing: Supervision/Verbal cueing     Toileting Toileting Toileting Activity did not occur (Clothing management and hygiene only): N/A (no void or bm)  Toileting assist Assist for toileting: Independent with assistive device     Transfers Chair/bed transfer  Transfers assist     Chair/bed transfer assist level: Independent     Locomotion Ambulation   Ambulation assist      Assist level: Supervision/Verbal cueing Assistive device: No Device Max distance: 676ft   Walk 10 feet activity   Assist     Assist level: Supervision/Verbal cueing Assistive device: No Device   Walk 50 feet activity   Assist    Assist level: Supervision/Verbal cueing Assistive device: No Device  Walk 150 feet activity   Assist Walk 150 feet activity did not occur: Safety/medical concerns (fatigue, weakness, decreased balance/postural control)  Assist level: Supervision/Verbal cueing Assistive device: No Device    Walk 10 feet on uneven surface  activity   Assist Walk 10 feet on uneven surfaces activity did not occur: Safety/medical concerns (fatigue, weakness, decreased balance/postural control)   Assist level: Supervision/Verbal cueing Assistive device: Other (comment) (none)   Wheelchair     Assist Is the patient using a wheelchair?: No Type of Wheelchair:  Manual    Wheelchair assist level: Supervision/Verbal cueing Max wheelchair distance: 156ft    Wheelchair 50 feet with 2 turns activity    Assist        Assist Level: Supervision/Verbal cueing   Wheelchair 150 feet activity     Assist      Assist Level: Supervision/Verbal cueing   Blood pressure 114/78, pulse 70, temperature 97.6 F (36.4 C), temperature source Oral, resp. rate 17, height 6\' 1"  (1.854 m), weight 96 kg, SpO2 96 %.  Medical Problem List and Plan: 1.  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  Drains discontinued 10/16/2020 Dc home today -HD tomorrow -f/u with [facility] 2.  Antithrombotics: -DVT/anticoagulation: SCDs.   Vascular study negative             -antiplatelet therapy: N/A 3. Pain Management: Hydrocodone as needed  Controlled on 10/20 4. Mood: Provide emotional support             -antipsychotic agents: N/A 5. Neuropsych: This patient is ? fully capable of making decisions on his own behalf. 6. Skin/Wound Care: Remove scalp staples this week 7. Fluids/Electrolytes/Nutrition: encourage PO 8.  Seizure prophylaxis.  Keppra 250 mg twice daily-- no seizure activity should be able to stop prior to d/c from CIR 9.  ESRD with decreased donor renal transplantatiion 09/13/2016.  Continue hemodialysis as directed  -HD after therapies to maximize participation during the day  Appreciate nephro recs 10.  Acute on chronic anemia.  Continue Aranesp    labs with per nephro with HD    11.  Hypertension.  Norvasc 5 mg daily, labetalol 300 mg 3 times daily, Cozaar 25 mg daily.      Vitals:   10/27/20 0502 10/27/20 0843  BP: 111/78 114/78  Pulse: 68 70  Resp: 17   Temp: 97.6 F (36.4 C)   SpO2: 96%      controlled on 10/20 12.  Prediabetes.  Hemoglobin A1c 5.6.  SSI    13.  History of  polysubstance abuse.  ETOH only?---discuss as outpt     LOS: 10 days A FACE TO FACE EVALUATION WAS PERFORMED  Meredith Staggers 10/27/2020, 9:28 AM

## 2020-10-27 NOTE — Progress Notes (Signed)
Inpatient Rehabilitation Care Coordinator Discharge Note   Patient Details  Name: Christopher Chapman MRN: [id number] Date of Birth: 03-12-1955   Discharge location: D/c to home. Supervision from mother and brother (pt reports Mother has Alzheimer's and brother is high functioning autistic)  Length of Stay: 9 days  Discharge activity level: Supervision  Home/community participation: Limited  Patient response BU:LAGTXM Literacy - How often do you need to have someone help you when you read instructions, pamphlets, or other written material from your doctor or pharmacy?: Rarely  Patient response IW:OEHOZY Isolation - How often do you feel lonely or isolated from those around you?: Never  Services provided included: MD, RD, PT, OT, SLP, TR, Neuropsych, SW, Pharmacy, CM, RN  Financial Services:  Financial Services Utilized: Medicaid    Choices offered to/list presented to: Yes  Follow-up services arranged:  Outpatient    Outpatient Servicies: [facility] for Outpatient PT/OT/SLP   Patient response to transportation need: Is the patient able to respond to transportation needs?: Yes In the past 12 months, has lack of transportation kept you from medical appointments or from getting medications?: No In the past 12 months, has lack of transportation kept you from meetings, work, or from getting things needed for daily living?: No   Comments (or additional information):  1) [place] to register for transportation services [phone]. This transportation can be used through 11/08/2020.   2) Pre-certified to use [facility] for dialysis treatment. Please 72 hrs in advance to schedule your reservation [phone]. You are arranged for the following transportation date(s)/time(s):  Hospital discharge pick-up on 10/27/20 between 11am-11:30am. Transportation will take you to [facility]. Location) to complete new patient paperwork, with pick up between 12pm-12:30pm.    Dialysis scheduled pick-up 5am-5:30pm for Friday (10/22) and  Monday (10/24). You will need to call on Friday to schedule transportation for next week as reservations can only be made seven days out.   3) Insurance will change from traditional Medicaid to [facility] on 11/08/2020. Please contact [place] transportation to use for dialysis by calling [phone]. If you choose to continue with [facility], you will pay $2 per trip.   4) [facility] for any assistance with medical appointments to a [employer] by calling [phone].   5) Personal care services [facility]) with [employer] [phone]. A referral will be sent to the following locations: 1) [facility], 2) [facility], and 3) [place].  Approved for an initial 60 hours for the month. Hours will be adjusted once a nursing evaluation has been completed.   Patient/Family verbalized understanding of follow-up arrangements:  Yes  Individual responsible for coordination of the follow-up plan: contact pt [phone] or pt mother [phone]  Confirmed correct DME delivered: Rana Snare 10/27/2020    Rana Snare

## 2020-10-27 NOTE — Progress Notes (Signed)
Inpatient Rehabilitation Medication Discharge Review by a Pharmacist  A complete drug regimen review was completed for this patient to identify any potential clinically significant medication issues.  High Risk Drug Classes Is patient taking? Indication by Medication  Antipsychotic No   Anticoagulant No   Antibiotic No   Opioid Yes Norco PRN for pain  Antiplatelet No   Hypoglycemics/insulin Yes Novolog sliding scale  Vasoactive Medication Yes labetalol, losartan for hypertension  Chemotherapy No   Other Yes Aranesp for chronic anemia, mycophenolate/tacrolimus for history of renal transplant, allopurinol, calcitriol for PTH control, Renvela for phosphorus level control, senokot for constipation, protonix, atorvastatin.     Type of Medication Issue Identified Description of Issue Recommendation(s)  Drug Interaction(s) (clinically significant)     Duplicate Therapy     Allergy     No Medication Administration End Date     Incorrect Dose     Additional Drug Therapy Needed   Atorvastatin resumed  Significant med changes from prior encounter (inform family/care partners about these prior to discharge).    Other       Clinically significant medication issues were identified that warrant physician communication and completion of prescribed/recommended actions by midnight of the next day:  No  Name of provider notified for urgent issues identified: Kirsteins, A  Provider Method of Notification: Epic secure chat    Pharmacist comments: No further issues  Onnie Boer, PharmD, BCIDP, AAHIVP, CPP Infectious Disease Pharmacist 10/27/2020 8:48 AM

## 2020-10-28 ENCOUNTER — Telehealth: Payer: Self-pay | Admitting: Physician Assistant

## 2020-10-28 NOTE — Telephone Encounter (Signed)
Transition of care contact from inpatient facility  Date of Discharge: 10/27/20 Date of Contact:10/28/20 Method of contact: Phone  Attempted to contact patient to discuss transition of care from inpatient admission. Patient was unable to come to the phone. Will try to reach him tomorrow.  Anice Paganini, PA-C 10/28/2020, 3:35 PM  [employer]

## 2020-11-09 ENCOUNTER — Encounter: Payer: Medicaid Other | Admitting: Registered Nurse

## 2020-11-10 ENCOUNTER — Encounter: Payer: Medicaid Other | Attending: Registered Nurse | Admitting: Registered Nurse

## 2020-11-10 ENCOUNTER — Encounter: Payer: Self-pay | Admitting: Registered Nurse

## 2020-11-10 ENCOUNTER — Other Ambulatory Visit: Payer: Self-pay

## 2020-11-10 VITALS — BP 148/88 | HR 74 | Temp 98.8°F | Ht 73.0 in | Wt 216.8 lb

## 2020-11-10 DIAGNOSIS — S065XAA Traumatic subdural hemorrhage with loss of consciousness status unknown, initial encounter: Secondary | ICD-10-CM | POA: Diagnosis present

## 2020-11-10 DIAGNOSIS — I10 Essential (primary) hypertension: Secondary | ICD-10-CM

## 2020-11-10 DIAGNOSIS — Z992 Dependence on renal dialysis: Secondary | ICD-10-CM

## 2020-11-10 DIAGNOSIS — N186 End stage renal disease: Secondary | ICD-10-CM | POA: Diagnosis present

## 2020-11-10 NOTE — Patient Instructions (Signed)
Call And Schedule Appointment   Dr Sherley Bounds [address]  [place]  [place], [place] [address] [phone]   Primary Care Physician  Dr Charleen Kirks  [phone]

## 2020-11-10 NOTE — Progress Notes (Signed)
Subjective:    Patient ID: Christopher Chapman, male    DOB: 09-19-1955, 65 y.o.   MRN: [id number]  HPI: Christopher Chapman is a 65 y.o. male who is here for HFU appointment fo follow up of his Subdural hematoma, ESRD on hemodialysis and Essential Hypertension. He presented to [facility] on 10/05/2020, after being found down for unknown amount of time.  Neurosurgery was consulted.  CT Head WO Contrast:    IMPRESSION: 1. Subacute right-sided subdural hematoma with significant mass effect and leftward midline shift measuring 1.7 cm. 2. No acute infarct.   CT Cervical Spine:  IMPRESSION: 1. No acute cervical spine fracture. On 10/05/2020, he underwent : See below by Dr Ronnald Ramp.  CRANIOTOMY HEMATOMA EVACUATION SUBDURAL  On 10/14/2020 he underwent: See Below by Dr Ronnald Ramp  Redo Right CRANIOTOMY HEMATOMA EVACUATION SUBDURAL  He was admitted to inpatient rehabilitation on 10/17/2020 and discharged home on 10/27/2020. He's receiving hemodialysis three days a week.  Christopher Chapman was instructed to call Dr Ronnald Ramp to scheduled HFU appointment , he verbalizes understanding.  He was instructed not to  drive till cleared by Dr Ronnald Ramp, he verbalizes understanding. He states " he has already resume driving" educated not to drive until he is cleared by Dr Ronnald Ramp, he verbalizes understanding.     Pain Inventory Average Pain 1 Pain Right Now 1 My pain is dull  LOCATION OF PAIN  head toes  BOWEL Number of stools per week: 8 Oral laxative use Yes  Type of laxative senna   BLADDER Dialysis  fistula in left arm   Mobility walk without assistance Do you have any goals in this area?  no  Function not employed: date last employed . I need assistance with the following:  household duties  Neuro/Psych bowel control problems confusion  Prior Studies Any changes since last visit?  no  Physicians involved in your care Any changes since last visit?  no   Family History  Problem Relation Age of  Onset   Diabetes Father    Heart disease Father    Hypertension Father    Other Father        amputation   Healthy Mother    Diabetes Sister    Heart disease Sister    Colon cancer Neg Hx    Stomach cancer Neg Hx    Rectal cancer Neg Hx    Esophageal cancer Neg Hx    Liver cancer Neg Hx    Colon polyps Neg Hx    Social History   Socioeconomic History   Marital status: Divorced    Spouse name: Not on file   Number of children: 0   Years of education: Not on file   Highest education level: Not on file  Occupational History   Occupation: disabled  Tobacco Use   Smoking status: Every Day    Packs/day: 0.10    Years: 30.00    Pack years: 3.00    Types: Cigarettes    Start date: 02/15/2016   Smokeless tobacco: Never   Tobacco comments:    Quit in Feb.  Substance and Sexual Activity   Alcohol use: Yes    Comment: occasional   Drug use: No    Frequency: 4.0 times per week   Sexual activity: Yes    Birth control/protection: None  Other Topics Concern   Not on file  Social History Narrative   Not on file   Social Determinants of Health   Financial Resource Strain: Not  on file  Food Insecurity: Not on file  Transportation Needs: Not on file  Physical Activity: Not on file  Stress: Not on file  Social Connections: Not on file   Past Surgical History:  Procedure Laterality Date   ANGIOPLASTY  08/09/2011   Procedure: ANGIOPLASTY;  Surgeon: Angelia Mould, MD;  Location: [facility];  Service: Vascular;  Laterality: Left;  Left Braciocephalic Fistula using Vascu-Guard [place] Left 2010   CRANIOTOMY Right 10/05/2020   Procedure: CRANIOTOMY HEMATOMA EVACUATION SUBDURAL;  Surgeon: [name], MD;  Location: [facility];  Service: Neurosurgery;  Laterality: Right;   CRANIOTOMY Right 10/14/2020   Procedure: Redo Right CRANIOTOMY HEMATOMA EVACUATION SUBDURAL;  Surgeon: [name], MD;  Location: [facility];  Service: Neurosurgery;   Laterality: Right;   INCISION AND DRAINAGE  12/2012   sebacebus cyst, infected   MANDIBLE FRACTURE SURGERY     [name] N/A 07/16/2011   Procedure: Earney Mallet;  Surgeon: Angelia Mould, MD;  Location: [facility] CATH [facility];  Service: Cardiovascular;  Laterality: N/A;   Past Medical History:  Diagnosis Date   Anemia    Arthritis    CRF (chronic renal failure)    ESRD (end stage renal disease) ([place])    Family history of anesthesia complication    [place] going to sleep.   Gout    Hx of cocaine abuse (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   BP (!) 148/88   Pulse 74   Temp 98.8 F (37.1 C)   Ht 6\' 1"  (1.854 m)   Wt 216 lb 12.8 oz (98.3 kg)   SpO2 97%   BMI 28.60 kg/m   Opioid Risk Score:   Fall Risk Score:  `1  Depression screen PHQ 2/9  Depression screen [facility] 2/9 11/10/2020 05/14/2016  Decreased Interest 2 0  Down, Depressed, Hopeless 0 0  PHQ - 2 Score 2 0  Altered sleeping 2 -  Tired, decreased energy 1 -  Change in appetite 0 -  Feeling bad or failure about yourself  1 -  Trouble concentrating 0 -  Moving slowly or fidgety/restless 0 -  Suicidal thoughts 0 -  PHQ-9 Score 6 -     Review of Systems  Constitutional: Negative.   HENT: Negative.    Eyes: Negative.   Respiratory: Negative.    Cardiovascular: Negative.   Gastrointestinal:  Positive for constipation.  Musculoskeletal: Negative.   Skin: Negative.   Allergic/Immunologic: Negative.   Neurological:  Positive for headaches.  Hematological: Negative.   Psychiatric/Behavioral:  Positive for confusion and dysphoric mood.   All other systems reviewed and are negative.     Objective:   Physical Exam Vitals and nursing note reviewed.  Constitutional:      Appearance: Normal  appearance.  Cardiovascular:     Rate and Rhythm: Normal rate and regular rhythm.     Pulses: Normal pulses.     Heart sounds: Normal heart sounds.  Pulmonary:     Effort: Pulmonary effort is normal.     Breath sounds: Normal breath sounds.  Musculoskeletal:     Cervical back: Normal range of motion and neck supple.  Comments: Normal Muscle Bulk and Muscle Testing Reveals:  Upper Extremities: Full ROM and Muscle Strength 5/5 Lower Extremities: Full ROM and Muscle Strength 5/5 Arises from Table with ease Narrow Based  Gait     Skin:    General: Skin is warm and dry.  Neurological:     Mental Status: He is alert and oriented to person, place, and time.  Psychiatric:        Mood and Affect: Mood normal.        Behavior: Behavior normal.         Assessment & Plan:  Subdural hematoma: Christopher Chapman was instructed to schedule HFU appointment with Dr Ronnald Ramp. He verbalizes understanding.  ESRD on hemodialysis: AVF: + [place]. Nephrology Following.  Essential Hypertension: Continue current medication regimen. He was instructed to scheduled HFU appointment. He verbalizes understanding.   F/U with Dr Naaman Plummer in 4- 6 weeks

## 2020-11-16 ENCOUNTER — Other Ambulatory Visit (HOSPITAL_COMMUNITY): Payer: Self-pay | Admitting: Neurological Surgery

## 2020-11-16 ENCOUNTER — Other Ambulatory Visit: Payer: Self-pay | Admitting: Neurological Surgery

## 2020-11-16 DIAGNOSIS — I6203 Nontraumatic chronic subdural hemorrhage: Secondary | ICD-10-CM

## 2020-11-25 ENCOUNTER — Encounter (HOSPITAL_COMMUNITY): Payer: Self-pay

## 2020-11-25 ENCOUNTER — Ambulatory Visit (HOSPITAL_COMMUNITY): Payer: Medicaid Other

## 2020-11-28 ENCOUNTER — Other Ambulatory Visit (HOSPITAL_COMMUNITY): Payer: Self-pay

## 2020-12-27 ENCOUNTER — Ambulatory Visit (HOSPITAL_COMMUNITY): Payer: Medicaid Other

## 2020-12-27 ENCOUNTER — Encounter (HOSPITAL_COMMUNITY): Payer: Self-pay

## 2021-01-25 ENCOUNTER — Encounter: Payer: Medicaid Other | Attending: Registered Nurse | Admitting: Physical Medicine & Rehabilitation

## 2021-01-25 ENCOUNTER — Other Ambulatory Visit: Payer: Self-pay

## 2021-01-25 DIAGNOSIS — I10 Essential (primary) hypertension: Secondary | ICD-10-CM

## 2021-01-25 DIAGNOSIS — N186 End stage renal disease: Secondary | ICD-10-CM | POA: Insufficient documentation

## 2021-01-25 DIAGNOSIS — S065XAA Traumatic subdural hemorrhage with loss of consciousness status unknown, initial encounter: Secondary | ICD-10-CM | POA: Insufficient documentation

## 2021-01-25 DIAGNOSIS — Z992 Dependence on renal dialysis: Secondary | ICD-10-CM | POA: Insufficient documentation

## 2021-01-25 NOTE — Telephone Encounter (Signed)
allopurinol (ZYLOPRIM) 100 MG tablet  amLODipine (NORVASC) 5 MG tablet, refill request @ [id number] - [place], [address] AT [place].

## 2021-01-26 ENCOUNTER — Ambulatory Visit: Payer: Medicaid Other | Admitting: Internal Medicine

## 2021-01-26 ENCOUNTER — Other Ambulatory Visit: Payer: Self-pay | Admitting: Internal Medicine

## 2021-01-26 ENCOUNTER — Other Ambulatory Visit: Payer: Self-pay

## 2021-01-26 DIAGNOSIS — Z992 Dependence on renal dialysis: Secondary | ICD-10-CM

## 2021-01-26 DIAGNOSIS — N186 End stage renal disease: Secondary | ICD-10-CM

## 2021-01-26 DIAGNOSIS — S065XAA Traumatic subdural hemorrhage with loss of consciousness status unknown, initial encounter: Secondary | ICD-10-CM

## 2021-01-26 DIAGNOSIS — I120 Hypertensive chronic kidney disease with stage 5 chronic kidney disease or end stage renal disease: Secondary | ICD-10-CM

## 2021-01-26 DIAGNOSIS — I10 Essential (primary) hypertension: Secondary | ICD-10-CM

## 2021-01-26 MED ORDER — AMLODIPINE BESYLATE 5 MG PO TABS: 5.0000 mg | ORAL_TABLET | Freq: Every day | ORAL | 0 refills | Status: AC

## 2021-01-26 MED ORDER — LABETALOL HCL 300 MG PO TABS: 300.0000 mg | ORAL_TABLET | Freq: Two times a day (BID) | ORAL | 0 refills | Status: DC

## 2021-01-26 NOTE — Progress Notes (Signed)
° °  CC: BP and check up  HPI:[name] is a 66 y.o. male who presents for evaluation of BP and checkup. Please see individual problem based A/P for details.  Depression, PHQ-9: Based on the patients  [place] Visit from 11/10/2020 in [facility]  PHQ-9 Total Score 6      score we have 6.  Past Medical History:  Diagnosis Date   Anemia    Arthritis    CRF (chronic renal failure)    ESRD (end stage renal disease) ([place])    Family history of anesthesia complication    [place] going to sleep.   Gout    Hx of cocaine abuse (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   Review of Systems:   Review of Systems  Constitutional: Negative.   HENT: Negative.    Eyes: Negative.   Respiratory: Negative.    Cardiovascular: Negative.   Gastrointestinal: Negative.   Genitourinary: Negative.   Musculoskeletal: Negative.   Skin: Negative.   Neurological: Negative.   Endo/Heme/Allergies: Negative.   Psychiatric/Behavioral: Negative.      Physical Exam: Vitals:   01/26/21 0840  BP: 118/73  Pulse: 66  SpO2: 98%  Weight: 218 lb 14.4 oz (99.3 kg)     General: alert and oriented HEENT: Conjunctiva nl , antiicteric sclerae, moist mucous membranes, no exudate or erythema Cardiovascular: Normal rate, regular rhythm.  No murmurs, rubs, or gallops Pulmonary : Equal breath sounds, No wheezes, rales, or rhonchi Abdominal: soft, nontender,  bowel sounds present Ext: No edema in lower extremities, no tenderness to palpation of lower extremities.   Assessment & Plan:   See Encounters Tab for problem based charting.  Patient discussed with Dr. Evette Doffing

## 2021-01-26 NOTE — Patient Instructions (Addendum)
Dear Mr. Guthridge,  Today we discussed your blood pressure and kidney disease. Please take the labetalol 300 twice daily. Please also continue to take the Amlodipine 5mg  daily. We will discontinue your Losartan.  Please also stop taking the aspirin as well as it can raise your risk of bleeding in your head again.  Please continue to follow up with your nephrologist.  We will plan to see you back in the clinic in 6 months.

## 2021-01-30 ENCOUNTER — Encounter: Payer: Self-pay | Admitting: Internal Medicine

## 2021-01-30 NOTE — Assessment & Plan Note (Signed)
Patient reports taking his labetalol typically twice a day. He is adherent to his amlodipine. Does not take losartan. Blood pressure controlled today.  Will plan to switch to labetalol 300 BID, continue amlodipine. Discontinue losartan

## 2021-01-30 NOTE — Progress Notes (Signed)
Internal Medicine Clinic Attending ° °Case discussed with Dr. Gawaluck  At the time of the visit.  We reviewed the resident’s history and exam and pertinent patient test results.  I agree with the assessment, diagnosis, and plan of care documented in the resident’s note.  °

## 2021-01-30 NOTE — Assessment & Plan Note (Signed)
Reports improvements in strength and balance. Able to ambulate to clinic with no difficulty.  He is no longer working with PT. CN grossly intact, moves all extremities appropriately. Thought appropriate.  Patient was taking over the counter aspirin, advised to discontinue this due to history of bleed.

## 2021-01-30 NOTE — Assessment & Plan Note (Signed)
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 Follows with nephro. Asymptomatic today.  BP normotensive and euvolemic.  Advised to follow up with nephro.

## 2021-02-04 ENCOUNTER — Other Ambulatory Visit: Payer: Self-pay

## 2021-02-04 ENCOUNTER — Ambulatory Visit (HOSPITAL_COMMUNITY)
Admission: EM | Admit: 2021-02-04 | Discharge: 2021-02-04 | Disposition: A | Discharge status: Home / Self Care | Payer: Medicaid Other | Attending: Emergency Medicine | Admitting: Emergency Medicine

## 2021-02-04 ENCOUNTER — Encounter (HOSPITAL_COMMUNITY): Payer: Self-pay

## 2021-02-04 DIAGNOSIS — R369 Urethral discharge, unspecified: Secondary | ICD-10-CM | POA: Insufficient documentation

## 2021-02-04 DIAGNOSIS — Z202 Contact with and (suspected) exposure to infections with a predominantly sexual mode of transmission: Secondary | ICD-10-CM | POA: Diagnosis not present

## 2021-02-04 MED ORDER — METRONIDAZOLE 500 MG PO TABS
ORAL_TABLET | ORAL | Status: AC
  Filled 2021-02-04: qty 4

## 2021-02-04 MED ORDER — METRONIDAZOLE 500 MG PO TABS
2000.0000 mg | ORAL_TABLET | Freq: Once | ORAL | Status: AC
  Administered 2021-02-04: 2000 mg via ORAL

## 2021-02-04 NOTE — ED Triage Notes (Signed)
Pt presents for exposure to STD. He would to be tested.

## 2021-02-04 NOTE — Discharge Instructions (Addendum)
Penile self-swab obtained.  We will follow up with you regarding abnormal results Metronidazole 2 g in a single dose given in office(do not take while consuming alcohol and/or if breastfeeding) Take medications as prescribed and to completion Please abstain from sexual activity until you and your partner(s) have been treated Follow up with PCP or [facility] if symptoms persists Return here or go to ER if you have any new or worsening symptoms fever, chills, nausea, vomiting, abdominal or pelvic pain, painful intercourse, vaginal discharge, vaginal bleeding, persistent symptoms despite treatment, etc..Marland Kitchen

## 2021-02-04 NOTE — ED Provider Notes (Signed)
[place]   [id number] 02/04/21 Arrival Time: [place]   Chief Complaint  Patient presents with   Exposure to STD     SUBJECTIVE:  Christopher Chapman is a 66 y.o. male who presented to the urgent care for complaint of penile discharge.  Reported testing positive for trichomonas in August and is here for the same symptom.  Stay sexually active with 1 male partner..  Denies penile discharge when asked.  Denies trauma, injury or precipitating event.  Denies fever, chills, nausea, vomiting, abdominal or pelvic pain, urinary symptoms, penile itching,  bleeding,  rashes or lesions.   No LMP for male patient. Current birth control method: Compliant with BC:  ROS: As per HPI.  All other pertinent ROS negative.     Past Medical History:  Diagnosis Date   Anemia    Arthritis    CRF (chronic renal failure)    ESRD (end stage renal disease) ([place])    Family history of anesthesia complication    [place] going to sleep.   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   Past Surgical History:  Procedure Laterality Date   ANGIOPLASTY  08/09/2011   Procedure: ANGIOPLASTY;  Surgeon: Angelia Mould, MD;  Location: [facility];  Service: Vascular;  Laterality: Left;  Left Braciocephalic Fistula using Vascu-Guard Patch   APPENDECTOMY  1980   [place] Left 2010   CRANIOTOMY Right 10/05/2020   Procedure: CRANIOTOMY HEMATOMA EVACUATION SUBDURAL;  Surgeon: [name], MD;  Location: [facility];  Service: Neurosurgery;  Laterality: Right;   CRANIOTOMY Right 10/14/2020   Procedure: Redo Right CRANIOTOMY HEMATOMA EVACUATION SUBDURAL;  Surgeon: [name], MD;  Location: [facility];  Service: Neurosurgery;  Laterality: Right;    INCISION AND DRAINAGE  12/2012   sebacebus cyst, infected   MANDIBLE FRACTURE SURGERY     [name] N/A 07/16/2011   Procedure: [place];  Surgeon: Angelia Mould, MD;  Location: [facility];  Service: Cardiovascular;  Laterality: N/A;   No Known Allergies No current facility-administered medications on file prior to encounter.   Current Outpatient Medications on File Prior to Encounter  Medication Sig Dispense Refill   Accu-Chek Softclix Lancets lancets Use as directed up to 4 times daily. 100 each 12   acetaminophen (TYLENOL) 325 MG tablet Take 2 tablets (650 mg total) by mouth every 4 (four) hours as needed for mild pain (temp > 100.5).     allopurinol (ZYLOPRIM) 100 MG tablet Take by mouth.     amLODipine (NORVASC) 5 MG tablet Take 1 tablet (5 mg total) by mouth daily. 30 tablet 0   atorvastatin (LIPITOR) 40 MG tablet Take 1 tablet (40 mg total) by mouth daily. 30 tablet 0   Blood Glucose Monitoring Suppl (ACCU-CHEK GUIDE) w/Device KIT Use as directed 1 kit 0   Blood Glucose Monitoring Suppl (ACCU-CHEK GUIDE) w/Device KIT Use as directed 1 kit 0   calcitRIOL (ROCALTROL) 0.5 MCG capsule Take 1 capsule (0.5 mcg total) by mouth every Monday, Wednesday, and Friday with hemodialysis. 30 capsule 0   colchicine 0.6 MG tablet Take by mouth.     Darbepoetin Alfa (ARANESP) 60  MCG/0.3ML SOSY injection Inject 0.3 mLs (60 mcg total) into the vein every Monday with hemodialysis. 4.2 mL    diphenhydrAMINE-APAP, sleep, (DIPHENHYDRAMINE-APAP PO) Take by mouth.     glucose blood test strip Use as directed 100 each 0   HYDROcodone-acetaminophen (NORCO/VICODIN) 5-325 MG tablet Take 1 tablet by mouth every 4 (four) hours as needed for moderate pain. 30 tablet 0   iron sucrose in sodium chloride 0.9 % 100 mL Iron Sucrose (Venofer)     labetalol (NORMODYNE) 300 MG tablet Take 1 tablet (300 mg total) by mouth 2 (two) times daily. 90 tablet 0   LOPERAMIDE HCL PO Take by mouth.     Methoxy PEG-Epoetin  Beta (MIRCERA IJ) Mircera     mycophenolate (MYFORTIC) 180 MG EC tablet Take 360 mg by mouth 2 (two) times daily.     mycophenolate (MYFORTIC) 360 MG TBEC EC tablet Take by mouth.     pantoprazole (PROTONIX) 40 MG tablet Take 1 tablet (40 mg total) by mouth at bedtime. 30 tablet 0   senna (SENOKOT) 8.6 MG tablet Take by mouth.     sevelamer carbonate (RENVELA) 800 MG tablet Take by mouth.     sucroferric oxyhydroxide (VELPHORO) 500 MG chewable tablet Chew by mouth.     tacrolimus (PROGRAF) 1 MG capsule tacrolimus capsule 1 mg     Tuberculin PPD (TUBERSOL ID) Inject into the skin.     VITAMIN D, CHOLECALCIFEROL, PO Take by mouth.      Social History   Socioeconomic History   Marital status: Divorced    Spouse name: Not on file   Number of children: 0   Years of education: Not on file   Highest education level: Not on file  Occupational History   Occupation: disabled  Tobacco Use   Smoking status: Every Day    Packs/day: 0.10    Years: 30.00    Pack years: 3.00    Types: Cigarettes    Start date: 02/15/2016   Smokeless tobacco: Never   Tobacco comments:    Quit in Feb.  Substance and Sexual Activity   Alcohol use: Yes    Comment: occasional   Drug use: No    Frequency: 4.0 times per week   Sexual activity: Yes    Birth control/protection: None  Other Topics Concern   Not on file  Social History Narrative   Not on file   Social Determinants of Health   Financial Resource Strain: Not on file  Food Insecurity: Not on file  Transportation Needs: Not on file  Physical Activity: Not on file  Stress: Not on file  Social Connections: Not on file  Intimate Partner Violence: Not on file   Family History  Problem Relation Age of Onset   Diabetes Father    Heart disease Father    Hypertension Father    Other Father        amputation   Healthy Mother    Diabetes Sister    Heart disease Sister    Colon cancer Neg Hx    Stomach cancer Neg Hx    Rectal cancer Neg Hx     Esophageal cancer Neg Hx    Liver cancer Neg Hx    Colon polyps Neg Hx     OBJECTIVE:  Vitals:   02/04/21 1618  BP: (!) 173/97  Pulse: 91  Resp: 16  Temp: 98.1 F (36.7 C)  SpO2: 100%     General appearance: Alert, NAD, appears stated age Head:  NCAT Throat: lips, mucosa, and tongue normal; teeth and gums normal Lungs: CTA bilaterally without adventitious breath sounds Heart: regular rate and rhythm.  Radial pulses 2+ symmetrical bilaterally Back: no CVA tenderness Abdomen: soft, non-tender; bowel sounds normal; no masses or organomegaly; no guarding or rebound tenderness GU: declines  Skin: warm and dry Psychological:  Alert and cooperative. Normal mood and affect.  LABS:  Results for orders placed or performed during the hospital encounter of 10/17/20  Urine Culture   Specimen: Urine, Clean Catch  Result Value Ref Range   Specimen Description URINE, CLEAN CATCH    Special Requests NONE    Culture (A)     <10,000 COLONIES/mL INSIGNIFICANT GROWTH Performed at [facility] [address], [place], [place] [address]    Report Status 10/21/2020 FINAL   Glucose, capillary  Result Value Ref Range   Glucose-Capillary 103 (H) 70 - 99 mg/dL  Glucose, capillary  Result Value Ref Range   Glucose-Capillary 105 (H) 70 - 99 mg/dL  Glucose, capillary  Result Value Ref Range   Glucose-Capillary 98 70 - 99 mg/dL  Glucose, capillary  Result Value Ref Range   Glucose-Capillary 106 (H) 70 - 99 mg/dL  Glucose, capillary  Result Value Ref Range   Glucose-Capillary 102 (H) 70 - 99 mg/dL  Glucose, capillary  Result Value Ref Range   Glucose-Capillary 92 70 - 99 mg/dL  Glucose, capillary  Result Value Ref Range   Glucose-Capillary 111 (H) 70 - 99 mg/dL  Urinalysis, Complete w Microscopic Urine, Clean Catch  Result Value Ref Range   Color, Urine YELLOW YELLOW   APPearance CLEAR CLEAR   Specific Gravity, Urine 1.009 1.005 - 1.030   pH 8.0 5.0 - 8.0   Glucose, UA 50 (A)  NEGATIVE mg/dL   Hgb urine dipstick NEGATIVE NEGATIVE   Bilirubin Urine NEGATIVE NEGATIVE   Ketones, ur NEGATIVE NEGATIVE mg/dL   Protein, ur >=300 (A) NEGATIVE mg/dL   Nitrite NEGATIVE NEGATIVE   Leukocytes,Ua SMALL (A) NEGATIVE   RBC / HPF 0-5 0 - 5 RBC/hpf   WBC, UA 11-20 0 - 5 WBC/hpf   Bacteria, UA RARE (A) NONE SEEN   Squamous Epithelial / LPF 0-5 0 - 5  Renal function panel  Result Value Ref Range   Sodium 137 135 - 145 mmol/L   Potassium 4.5 3.5 - 5.1 mmol/L   Chloride 103 98 - 111 mmol/L   CO2 27 22 - 32 mmol/L   Glucose, Bld 106 (H) 70 - 99 mg/dL   BUN 37 (H) 8 - 23 mg/dL   Creatinine, Ser 7.46 (H) 0.61 - 1.24 mg/dL   Calcium 7.9 (L) 8.9 - 10.3 mg/dL   Phosphorus 4.5 2.5 - 4.6 mg/dL   Albumin 2.5 (L) 3.5 - 5.0 g/dL   GFR, Estimated 8 (L) >60 mL/min   Anion gap 7 5 - 15  CBC  Result Value Ref Range   WBC 8.8 4.0 - 10.5 K/uL   RBC 2.69 (L) 4.22 - 5.81 MIL/uL   Hemoglobin 7.5 (L) 13.0 - 17.0 g/dL   HCT 24.8 (L) 39.0 - 52.0 %   MCV 92.2 80.0 - 100.0 fL   MCH 27.9 26.0 - 34.0 pg   MCHC 30.2 30.0 - 36.0 g/dL   RDW 16.2 (H) 11.5 - 15.5 %   Platelets 198 150 - 400 K/uL   nRBC 0.0 0.0 - 0.2 %  Glucose, capillary  Result Value Ref Range   Glucose-Capillary 150 (H) 70 -  99 mg/dL  Glucose, capillary  Result Value Ref Range   Glucose-Capillary 97 70 - 99 mg/dL  Glucose, capillary  Result Value Ref Range   Glucose-Capillary 97 70 - 99 mg/dL  Glucose, capillary  Result Value Ref Range   Glucose-Capillary 100 (H) 70 - 99 mg/dL  Glucose, capillary  Result Value Ref Range   Glucose-Capillary 113 (H) 70 - 99 mg/dL  Glucose, capillary  Result Value Ref Range   Glucose-Capillary 113 (H) 70 - 99 mg/dL  Glucose, capillary  Result Value Ref Range   Glucose-Capillary 99 70 - 99 mg/dL  Glucose, capillary  Result Value Ref Range   Glucose-Capillary 98 70 - 99 mg/dL  Glucose, capillary  Result Value Ref Range   Glucose-Capillary 131 (H) 70 - 99 mg/dL  Glucose, capillary   Result Value Ref Range   Glucose-Capillary 100 (H) 70 - 99 mg/dL  Glucose, capillary  Result Value Ref Range   Glucose-Capillary 98 70 - 99 mg/dL   Comment 1 Notify RN   Glucose, capillary  Result Value Ref Range   Glucose-Capillary 121 (H) 70 - 99 mg/dL  Glucose, capillary  Result Value Ref Range   Glucose-Capillary 100 (H) 70 - 99 mg/dL  Glucose, capillary  Result Value Ref Range   Glucose-Capillary 99 70 - 99 mg/dL  Glucose, capillary  Result Value Ref Range   Glucose-Capillary 89 70 - 99 mg/dL   Comment 1 Notify RN   Glucose, capillary  Result Value Ref Range   Glucose-Capillary 112 (H) 70 - 99 mg/dL   Comment 1 Notify RN   Glucose, capillary  Result Value Ref Range   Glucose-Capillary 97 70 - 99 mg/dL  Glucose, capillary  Result Value Ref Range   Glucose-Capillary 92 70 - 99 mg/dL  Glucose, capillary  Result Value Ref Range   Glucose-Capillary 110 (H) 70 - 99 mg/dL  Glucose, capillary  Result Value Ref Range   Glucose-Capillary 121 (H) 70 - 99 mg/dL  Glucose, capillary  Result Value Ref Range   Glucose-Capillary 107 (H) 70 - 99 mg/dL  Glucose, capillary  Result Value Ref Range   Glucose-Capillary 92 70 - 99 mg/dL  Glucose, capillary  Result Value Ref Range   Glucose-Capillary 107 (H) 70 - 99 mg/dL   Comment 1 Notify RN   Glucose, capillary  Result Value Ref Range   Glucose-Capillary 118 (H) 70 - 99 mg/dL   Comment 1 Notify RN   Glucose, capillary  Result Value Ref Range   Glucose-Capillary 130 (H) 70 - 99 mg/dL  Glucose, capillary  Result Value Ref Range   Glucose-Capillary 127 (H) 70 - 99 mg/dL  Glucose, capillary  Result Value Ref Range   Glucose-Capillary 112 (H) 70 - 99 mg/dL   Comment 1 Notify RN   Glucose, capillary  Result Value Ref Range   Glucose-Capillary 101 (H) 70 - 99 mg/dL  Glucose, capillary  Result Value Ref Range   Glucose-Capillary 99 70 - 99 mg/dL    Labs Reviewed  CYTOLOGY, (ORAL, ANAL, URETHRAL) ANCILLARY ONLY     ASSESSMENT & PLAN:  1. Penile discharge   2. STD exposure     Meds ordered this encounter  Medications   metroNIDAZOLE (FLAGYL) tablet 2,000 mg    Pending: Labs Reviewed  CYTOLOGY, (ORAL, ANAL, URETHRAL) ANCILLARY ONLY    Patient is stable at discharge.  He was treated for possible trichomonas with 2 g of metronidazole in office.  Will await test result  Discharge instructions  Penile  self-swab obtained.  We will follow up with you regarding abnormal results Prescribed metronidazole 2 g in a single dose (do not take while consuming alcohol and/or if breastfeeding) Take medications as prescribed and to completion Please abstain from sexual activity until you and your partner(s) have been treated Follow up with PCP or [place] if symptoms persists Return here or go to ER if you have any new or worsening symptoms fever, chills, nausea, vomiting, abdominal or pelvic pain, painful intercourse, vaginal discharge, vaginal bleeding, persistent symptoms despite treatment, etc...  Reviewed expectations re: course of current medical issues. Questions answered. Outlined signs and symptoms indicating need for more acute intervention. Patient verbalized understanding. After Visit Summary given.        Emerson Monte, [place] 02/04/21 [phone]

## 2021-02-06 LAB — CYTOLOGY, (ORAL, ANAL, URETHRAL) ANCILLARY ONLY
Chlamydia: NEGATIVE
Comment: NEGATIVE
Comment: NEGATIVE
Comment: NORMAL
Neisseria Gonorrhea: NEGATIVE
Trichomonas: POSITIVE — AB

## 2021-02-07 ENCOUNTER — Other Ambulatory Visit: Payer: Self-pay

## 2021-02-07 ENCOUNTER — Ambulatory Visit (HOSPITAL_COMMUNITY)
Admission: RE | Admit: 2021-02-07 | Discharge: 2021-02-07 | Disposition: A | Discharge status: Home / Self Care | Payer: Medicaid Other | Source: Ambulatory Visit | Attending: Neurological Surgery | Admitting: Neurological Surgery

## 2021-02-07 DIAGNOSIS — I6203 Nontraumatic chronic subdural hemorrhage: Secondary | ICD-10-CM | POA: Insufficient documentation

## 2021-02-18 ENCOUNTER — Encounter (HOSPITAL_COMMUNITY): Payer: Self-pay | Admitting: Emergency Medicine

## 2021-02-18 ENCOUNTER — Ambulatory Visit (HOSPITAL_COMMUNITY)
Admission: EM | Admit: 2021-02-18 | Discharge: 2021-02-18 | Disposition: A | Discharge status: Home / Self Care | Payer: Medicaid Other | Attending: Family Medicine | Admitting: Family Medicine

## 2021-02-18 ENCOUNTER — Other Ambulatory Visit: Payer: Self-pay

## 2021-02-18 DIAGNOSIS — Z202 Contact with and (suspected) exposure to infections with a predominantly sexual mode of transmission: Secondary | ICD-10-CM | POA: Diagnosis not present

## 2021-02-18 MED ORDER — METRONIDAZOLE 500 MG PO TABS
ORAL_TABLET | ORAL | Status: AC
  Filled 2021-02-18: qty 4

## 2021-02-18 MED ORDER — METRONIDAZOLE 500 MG PO TABS
2000.0000 mg | ORAL_TABLET | Freq: Once | ORAL | Status: AC
  Administered 2021-02-18: 2000 mg via ORAL

## 2021-02-18 NOTE — Discharge Instructions (Signed)
Meds ordered this encounter  Medications   metroNIDAZOLE (FLAGYL) tablet 2,000 mg

## 2021-02-18 NOTE — ED Triage Notes (Signed)
Pt reports re-exposure to trich. Pt states he had sexual intercourse before the treatment was completed. Requesting treatment for trichomonas.

## 2021-02-20 NOTE — ED Provider Notes (Signed)
[facility]   [id number] 02/18/21 Arrival Time: 6440  ASSESSMENT & PLAN:  1. Exposure to trichomonas    Cytology pending.    Discharge Instructions       Meds ordered this encounter  Medications   metroNIDAZOLE (FLAGYL) tablet 2,000 mg        Will notify of any positive results. Instructed to refrain from sexual activity for at least seven days.  Reviewed expectations re: course of current medical issues. Questions answered. Outlined signs and symptoms indicating need for more acute intervention. Patient verbalized understanding. After Visit Summary given.   SUBJECTIVE:  Christopher Chapman is a 66 y.o. male who requests treatment for trich. Recent exposure. No symptoms. Afebrile. No abdominal or pelvic pain. No n/v. No rashes or lesions. Reports that he is sexually active with single male partner.   OBJECTIVE:  Vitals:   02/18/21 1734 02/18/21 1735  BP:  (!) 170/98  Pulse:  65  Resp:  18  Temp:  98.8 F (37.1 C)  TempSrc:  Oral  SpO2:  94%  Weight: 99.3 kg   Height: 6\' 1"  (1.854 m)      General appearance: alert, cooperative, appears stated age and no distress Throat: lips, mucosa, and tongue normal; teeth and gums normal GU: deferred Skin: warm and dry Psychological: alert and cooperative; normal mood and affect.   Labs Reviewed - No data to display  No Known Allergies  Past Medical History:  Diagnosis Date   Anemia    Arthritis    CRF (chronic renal failure)    ESRD (end stage renal disease) ([place])    Family history of anesthesia complication    [place] going to sleep.   Gout    Hx of cocaine abuse ([place])    Hyperlipidemia    Hypertension    Hyperuricemia    MRSA (methicillin resistant staph aureus) culture positive 07/2011   Parathyroid disease ([place])    Hx of , No meds at present   Tobacco abuse    Tuberculosis 1985   Treated with imeds for a years.   Family History  Problem Relation Age of Onset   Diabetes Father     Heart disease Father    Hypertension Father    Other Father        amputation   Healthy Mother    Diabetes Sister    Heart disease Sister    Colon cancer Neg Hx    Stomach cancer Neg Hx    Rectal cancer Neg Hx    Esophageal cancer Neg Hx    Liver cancer Neg Hx    Colon polyps Neg Hx    Social History   Socioeconomic History   Marital status: Divorced    Spouse name: Not on file   Number of children: 0   Years of education: Not on file   Highest education level: Not on file  Occupational History   Occupation: disabled  Tobacco Use   Smoking status: Every Day    Packs/day: 0.10    Years: 30.00    Pack years: 3.00    Types: Cigarettes    Start date: 02/15/2016   Smokeless tobacco: Never   Tobacco comments:    Quit in Feb.  Substance and Sexual Activity   Alcohol use: Yes    Comment: occasional   Drug use: No    Frequency: 4.0 times per week   Sexual activity: Yes    Birth control/protection: None  Other Topics Concern   Not  on file  Social History Narrative   Not on file   Social Determinants of Health   Financial Resource Strain: Not on file  Food Insecurity: Not on file  Transportation Needs: Not on file  Physical Activity: Not on file  Stress: Not on file  Social Connections: Not on file  Intimate Partner Violence: Not on file           Vanessa Kick, MD 02/20/21 [phone]

## 2021-03-14 ENCOUNTER — Encounter (HOSPITAL_COMMUNITY): Payer: Self-pay | Admitting: Emergency Medicine

## 2021-03-14 ENCOUNTER — Ambulatory Visit (HOSPITAL_COMMUNITY)
Admission: EM | Admit: 2021-03-14 | Discharge: 2021-03-14 | Disposition: A | Discharge status: Home / Self Care | Payer: Medicaid Other | Attending: Physician Assistant | Admitting: Physician Assistant

## 2021-03-14 ENCOUNTER — Other Ambulatory Visit: Payer: Self-pay

## 2021-03-14 DIAGNOSIS — N4889 Other specified disorders of penis: Secondary | ICD-10-CM | POA: Diagnosis not present

## 2021-03-14 DIAGNOSIS — Z8619 Personal history of other infectious and parasitic diseases: Secondary | ICD-10-CM | POA: Insufficient documentation

## 2021-03-14 MED ORDER — METRONIDAZOLE 500 MG PO TABS
2000.0000 mg | ORAL_TABLET | Freq: Once | ORAL | Status: AC
  Administered 2021-03-14: 2000 mg via ORAL

## 2021-03-14 MED ORDER — METRONIDAZOLE 500 MG PO TABS
ORAL_TABLET | ORAL | Status: AC
  Filled 2021-03-14: qty 4

## 2021-03-14 NOTE — ED Triage Notes (Signed)
Patient reports he has been exposed to trich.  Patient has a history of this ?

## 2021-03-14 NOTE — ED Provider Notes (Signed)
[facility]    CSN: [id number] Arrival date & time: 03/14/21  1413      History   Chief Complaint Chief Complaint  Patient presents with   SEXUALLY TRANSMITTED DISEASE    HPI Christopher Sullenger Torre [place]. is a 66 y.o. male.   Patient presents today with a several day history of urethritis/penile irritation.  He has a history of recurrent trichomonas and states current symptoms are similar to previous episodes of this condition.  He was last seen and treated for this condition by our clinic on 02/18/2021.  Reports that he inform partner that they need to be tested and treated but does not believe that they went through with this.  He denies additional symptoms including abdominal pain, fever, nausea, vomiting.  He is a hemodialysis patient on Monday Wednesday Friday schedule and does not create urine so denies any dysuria.  He does report symptoms initially improved following metronidazole dose and is requesting repeat of this today.   Past Medical History:  Diagnosis Date   Anemia    Arthritis    CRF (chronic renal failure)    ESRD (end stage renal disease) ([place])    Family history of anesthesia complication    [place] going to sleep.   Gout    Hx of cocaine abuse ([place])    Hyperlipidemia    Hypertension    Hyperuricemia    MRSA (methicillin resistant staph aureus) culture positive 07/2011   Parathyroid disease ([place])    Hx of , No meds at present   Tobacco abuse    Tuberculosis 1985   Treated with imeds for a years.    Patient Active Problem List   Diagnosis Date Noted   Prediabetes    Acute on chronic anemia    ESRD on dialysis [facility])    Traumatic subdural hematoma 10/17/2020   Subdural hematoma 10/05/2020   S/P craniotomy 10/05/2020   Altered mental status    ABLA (acute blood loss anemia)    Stage 4 chronic kidney disease ([place])    Type II diabetes mellitus ([place]) 03/15/2020   CMV (cytomegalovirus infection) ([place]) 01/29/2017   Kidney replaced by  transplant 10/04/2016   Immunosuppression ([place]) 09/24/2016   History of gout 05/14/2016   Carotid stenosis, bilateral 05/14/2016   Routine adult health maintenance 05/14/2016   Dyslipidemia 01/14/2013   History of cocaine use 01/14/2013   End stage renal disease ([place]) 06/27/2011   Essential hypertension 08/19/2006   HYPERPARATHYROIDISM, SECONDARY 08/19/2006   ALCOHOL ABUSE, HX OF 08/19/2006    Past Surgical History:  Procedure Laterality Date   ANGIOPLASTY  08/09/2011   Procedure: ANGIOPLASTY;  Surgeon: Angelia Mould, MD;  Location: [facility];  Service: Vascular;  Laterality: Left;  Left Braciocephalic Fistula using Vascu-Guard Patch   [place] Left 2010   CRANIOTOMY Right 10/05/2020   Procedure: CRANIOTOMY HEMATOMA EVACUATION SUBDURAL;  Surgeon: [name], MD;  Location: [place];  Service: Neurosurgery;  Laterality: Right;   CRANIOTOMY Right 10/14/2020   Procedure: Redo Right CRANIOTOMY HEMATOMA EVACUATION SUBDURAL;  Surgeon: [name], MD;  Location: [place];  Service: Neurosurgery;  Laterality: Right;   INCISION AND DRAINAGE  12/2012   sebacebus cyst, infected   MANDIBLE FRACTURE SURGERY     [name] N/A 07/16/2011   Procedure: [place];  Surgeon: Angelia Mould, MD;  Location: [facility] CATH LAB;  Service: Cardiovascular;  Laterality: N/A;       Home Medications  Prior to Admission medications   Medication Sig Start Date End Date Taking? Authorizing Provider  Accu-Chek Softclix Lancets lancets Use as directed up to 4 times daily. 10/26/20   Angiulli, Lavon Paganini, PA-C  acetaminophen (TYLENOL) 325 MG tablet Take 2 tablets (650 mg total) by mouth every 4 (four) hours as needed for mild pain (temp > 100.5). 10/26/20   Angiulli, Lavon Paganini, PA-C  allopurinol (ZYLOPRIM) 100 MG tablet Take by mouth. 11/18/20   [provider]  amLODipine (NORVASC) 5 MG tablet Take 1 tablet (5 mg total) by mouth daily. 01/26/21 01/26/22   Delene Ruffini, MD  atorvastatin (LIPITOR) 40 MG tablet Take 1 tablet (40 mg total) by mouth daily. 10/26/20 10/26/21  Angiulli, Lavon Paganini, PA-C  Blood Glucose Monitoring Suppl (ACCU-CHEK GUIDE) w/Device KIT Use as directed 10/26/20   Meredith Staggers, MD  Blood Glucose Monitoring Suppl (ACCU-CHEK GUIDE) w/Device KIT Use as directed 10/27/20   Cathlyn Parsons, PA-C  calcitRIOL (ROCALTROL) 0.5 MCG capsule Take 1 capsule (0.5 mcg total) by mouth every Monday, Wednesday, and Friday with hemodialysis. 10/26/20   Angiulli, Lavon Paganini, PA-C  colchicine 0.6 MG tablet Take by mouth. 12/02/13   [provider]  Darbepoetin Alfa (ARANESP) 60 MCG/0.3ML SOSY injection Inject 0.3 mLs (60 mcg total) into the vein every Monday with hemodialysis. 10/17/20   Orvis Brill, MD  diphenhydrAMINE-APAP, sleep, (DIPHENHYDRAMINE-APAP PO) Take by mouth. 12/30/20   [provider]  glucose blood test strip Use as directed 10/26/20   Meredith Staggers, MD  HYDROcodone-acetaminophen (NORCO/VICODIN) 5-325 MG tablet Take 1 tablet by mouth every 4 (four) hours as needed for moderate pain. 10/26/20   Angiulli, Lavon Paganini, PA-C  iron sucrose in sodium chloride 0.9 % 100 mL Iron Sucrose (Venofer) 12/21/20 12/20/21  [provider]  labetalol (NORMODYNE) 300 MG tablet Take 1 tablet (300 mg total) by mouth 2 (two) times daily. 01/26/21   Delene Ruffini, MD  LOPERAMIDE HCL PO Take by mouth. 12/28/20 01/01/22  [provider]  Methoxy PEG-Epoetin Beta (MIRCERA IJ) Mircera 11/02/20 11/01/21  [provider]  mycophenolate (MYFORTIC) 180 MG EC tablet Take 360 mg by mouth 2 (two) times daily. 07/03/17   [provider]  mycophenolate (MYFORTIC) 360 MG TBEC EC tablet Take by mouth. 11/18/20   [provider]  pantoprazole (PROTONIX) 40 MG tablet Take 1 tablet (40 mg total) by mouth at bedtime. 10/26/20   Angiulli, Lavon Paganini, PA-C  senna (SENOKOT) 8.6 MG tablet Take by  mouth. 10/17/20   [provider]  sevelamer carbonate (RENVELA) 800 MG tablet Take by mouth. 12/07/20   [provider]  sucroferric oxyhydroxide (VELPHORO) 500 MG chewable tablet Chew by mouth. 06/14/16   [provider]  tacrolimus (PROGRAF) 1 MG capsule tacrolimus capsule 1 mg 10/17/20   [provider]  Tuberculin PPD (TUBERSOL ID) Inject into the skin. 10/28/20   [provider]  VITAMIN D, CHOLECALCIFEROL, PO Take by mouth. Patient not taking: Reported on 03/14/2021 12/28/20 12/27/21  [provider]    Family History Family History  Problem Relation Age of Onset   Diabetes Father    Heart disease Father    Hypertension Father    Other Father        amputation   Healthy Mother    Diabetes Sister    Heart disease Sister    Colon cancer Neg Hx    Stomach cancer Neg Hx    Rectal cancer Neg Hx  Esophageal cancer Neg Hx    Liver cancer Neg Hx    Colon polyps Neg Hx     Social History Social History   Tobacco Use   Smoking status: Former    Packs/day: 0.10    Years: 30.00    Pack years: 3.00    Types: Cigarettes    Start date: 02/15/2016   Smokeless tobacco: Never   Tobacco comments:    Quit in Feb.  Vaping Use   Vaping Use: Never used  Substance Use Topics   Alcohol use: Yes    Comment: occasional   Drug use: No    Frequency: 4.0 times per week     Allergies   Patient has no known allergies.   Review of Systems Review of Systems  Constitutional:  Negative for activity change, appetite change, fatigue and fever.  Respiratory:  Negative for cough and shortness of breath.   Cardiovascular:  Negative for chest pain.  Gastrointestinal:  Negative for abdominal pain, diarrhea, nausea and vomiting.  Genitourinary:  Positive for penile pain (irritation). Negative for dysuria, penile discharge, penile swelling and testicular pain.  Neurological:  Negative for dizziness, light-headedness and headaches.     Physical Exam Triage Vital Signs ED Triage Vitals  Enc Vitals Group     BP 03/14/21 1537 (!) 148/79     Pulse Rate 03/14/21 1537 68     Resp 03/14/21 1537 20     Temp 03/14/21 1537 98 F (36.7 C)     Temp Source 03/14/21 1537 Oral     SpO2 03/14/21 1537 97 %     Weight --      Height --      Head Circumference --      Peak Flow --      Pain Score 03/14/21 1534 5     Pain Loc --      Pain Edu? --      Excl. in [place]? --    No data found.  Updated Vital Signs BP (!) 148/79 (BP Location: Left Arm) Comment (BP Location): large cuff   Pulse 68    Temp 98 F (36.7 C) (Oral)    Resp 20    SpO2 97%   Visual Acuity Right Eye Distance:   Left Eye Distance:   Bilateral Distance:    Right Eye Near:   Left Eye Near:    Bilateral Near:     Physical Exam Vitals reviewed.  Constitutional:      General: He is awake.     Appearance: Normal appearance. He is well-developed. He is not ill-appearing.     Comments: Very pleasant male appears stated age in no acute distress sitting comfortably in exam room  HENT:     Head: Normocephalic and atraumatic.  Cardiovascular:     Rate and Rhythm: Normal rate and regular rhythm.     Heart sounds: Normal heart sounds, S1 normal and S2 normal. No murmur heard. Pulmonary:     Effort: Pulmonary effort is normal.     Breath sounds: Normal breath sounds. No stridor. No wheezing, rhonchi or rales.     Comments: Clear to auscultation bilaterally Abdominal:     General: Bowel sounds are normal.     Palpations: Abdomen is soft.     Tenderness: There is no abdominal tenderness. There is no right CVA tenderness or left CVA tenderness.     Comments: Benign abdominal exam  Genitourinary:    Comments: Exam deferred Neurological:  Mental Status: He is alert.  Psychiatric:        Behavior: Behavior is cooperative.     UC Treatments / Results  Labs (all labs ordered are listed, but only abnormal results are displayed) Labs Reviewed   CYTOLOGY, (ORAL, ANAL, URETHRAL) ANCILLARY ONLY    EKG   Radiology No results found.  Procedures Procedures (including critical care time)  Medications Ordered in UC Medications  metroNIDAZOLE (FLAGYL) tablet 2,000 mg (has no administration in time range)    Initial Impression / Assessment and Plan / UC Course  I have reviewed the triage vital signs and the nursing notes.  Pertinent labs & imaging results that were available during my care of the patient were reviewed by me and considered in my medical decision making (see chart for details).     Empirically treat for trichomonas given suspected exposure.  STI swab was collected today-results pending.  Patient was given 2 g of metronidazole in clinic.  No indication for renal dose adjustment based on up-to-date.  Discussed the importance of safe sex practices and he is to abstain from sex for minimum of 7 days.  All partners will need to be tested and treated as well.  We will contact him if we need to arrange any additional treatment.  Discussed alarm symptoms that warrant emergent evaluation.  Strict return precautions given to which he expressed understanding.  Final Clinical Impressions(s) / UC Diagnoses   Final diagnoses:  Penile irritation  History of trichomoniasis     Discharge Instructions      We are treating you for trichomonas today.  Please abstain from sex for minimum of 7 days.  All partners will need to be tested and treated as you can get this again.  Use a condom with each sexual encounter.  If you have persistent or worsening symptoms you need to return for reevaluation.  We will contact you if we need to arrange additional treatment based on your swab result.     ED Prescriptions   None    PDMP not reviewed this encounter.   Terrilee Croak, PA-C 03/14/21 1553

## 2021-03-14 NOTE — Discharge Instructions (Addendum)
We are treating you for trichomonas today.  Please abstain from sex for minimum of 7 days.  All partners will need to be tested and treated as you can get this again.  Use a condom with each sexual encounter.  If you have persistent or worsening symptoms you need to return for reevaluation.  We will contact you if we need to arrange additional treatment based on your swab result. ?

## 2021-03-15 LAB — CYTOLOGY, (ORAL, ANAL, URETHRAL) ANCILLARY ONLY
Chlamydia: NEGATIVE
Comment: NEGATIVE
Comment: NEGATIVE
Comment: NORMAL
Neisseria Gonorrhea: NEGATIVE
Trichomonas: POSITIVE — AB

## 2021-03-18 ENCOUNTER — Other Ambulatory Visit: Payer: Self-pay | Admitting: Internal Medicine

## 2021-04-03 ENCOUNTER — Other Ambulatory Visit: Payer: Self-pay | Admitting: Internal Medicine

## 2021-04-03 MED ORDER — LABETALOL HCL 300 MG PO TABS: 300.0000 mg | ORAL_TABLET | Freq: Two times a day (BID) | ORAL | 0 refills | Status: DC

## 2021-05-02 ENCOUNTER — Encounter (HOSPITAL_COMMUNITY): Payer: Self-pay | Admitting: Emergency Medicine

## 2021-05-02 ENCOUNTER — Ambulatory Visit (HOSPITAL_COMMUNITY)
Admission: EM | Admit: 2021-05-02 | Discharge: 2021-05-02 | Disposition: A | Discharge status: Home / Self Care | Payer: Medicaid Other | Attending: Family Medicine | Admitting: Family Medicine

## 2021-05-02 DIAGNOSIS — N342 Other urethritis: Secondary | ICD-10-CM | POA: Diagnosis not present

## 2021-05-02 MED ORDER — METRONIDAZOLE 500 MG PO TABS: 500.0000 mg | ORAL_TABLET | Freq: Two times a day (BID) | ORAL | 0 refills | Status: AC

## 2021-05-02 NOTE — ED Triage Notes (Signed)
Pt reports an exposure to trich. Hx of Trich. States he did not get cured from the "last time".  ?

## 2021-05-02 NOTE — Discharge Instructions (Addendum)
Staff will call you if any tests are positive ? ?Take metronidazole 500 mg--1 tab twice daily for 1 week.  On the days you have dialysis, take the metronidazole after dialysis. ? ?Do not drink alcohol within 72 hours of taking metronidazole ?

## 2021-05-02 NOTE — ED Provider Notes (Signed)
?[facility] ? ? ? ?CSN: [id number] ?Arrival date & time: 05/02/21  1046 ? ? ?  ? ?History   ?Chief Complaint ?Chief Complaint  ?Patient presents with  ? Exposure to STD  ? ? ?HPI ?Christopher Najjar Murice Barbar. is a 66 y.o. male.  ? ? ?Exposure to STD ? here for penile irritation that began about a month to a month and a half ago.  He had been treated for trichomonas with a 1 time dose of metronidazole after his visit here March 7.  Symptoms improved for about 4 days, and then returned at about 4 days after treatment. ? ?No fever or vomiting or abdominal pain. ? ?He is on dialysis 3 days weekly ? ?Past Medical History:  ?Diagnosis Date  ? Anemia   ? Arthritis   ? CRF (chronic renal failure)   ? ESRD (end stage renal disease) ([place])   ? Family history of anesthesia complication   ? [place] going to sleep.  ? Gout   ? Hx of cocaine abuse ([place])   ? Hyperlipidemia   ? Hypertension   ? Hyperuricemia   ? MRSA (methicillin resistant staph aureus) culture positive 07/2011  ? Parathyroid disease ([place])   ? Hx of , No meds at present  ? Tobacco abuse   ? Tuberculosis 1985  ? Treated with imeds for a years.  ? ? ?Patient Active Problem List  ? Diagnosis Date Noted  ? Prediabetes   ? Acute on chronic anemia   ? ESRD on dialysis [facility])   ? Traumatic subdural hematoma ([place]) 10/17/2020  ? Subdural hematoma 10/05/2020  ? S/P craniotomy 10/05/2020  ? Altered mental status   ? ABLA (acute blood loss anemia)   ? Stage 4 chronic kidney disease ([place])   ? Type II diabetes mellitus ([place]) 03/15/2020  ? CMV (cytomegalovirus infection) ([place]) 01/29/2017  ? Kidney replaced by transplant 10/04/2016  ? Immunosuppression ([place]) 09/24/2016  ? History of gout 05/14/2016  ? Carotid stenosis, bilateral 05/14/2016  ? Routine adult health maintenance 05/14/2016  ? Dyslipidemia 01/14/2013  ? History of cocaine use 01/14/2013  ? End stage renal disease ([place]) 06/27/2011  ? Essential hypertension 08/19/2006  ? HYPERPARATHYROIDISM, SECONDARY  08/19/2006  ? ALCOHOL ABUSE, HX OF 08/19/2006  ? ? ?Past Surgical History:  ?Procedure Laterality Date  ? ANGIOPLASTY  08/09/2011  ? Procedure: ANGIOPLASTY;  Surgeon: Angelia Mould, MD;  Location: [facility];  Service: Vascular;  Laterality: Left;  Left Braciocephalic Fistula using Vascu-Guard Patch  ? APPENDECTOMY  1980  ? AV FISTULA PLACEMENT Left 2010  ? CRANIOTOMY Right 10/05/2020  ? Procedure: CRANIOTOMY HEMATOMA EVACUATION SUBDURAL;  Surgeon: [name], MD;  Location: [place];  Service: Neurosurgery;  Laterality: Right;  ? CRANIOTOMY Right 10/14/2020  ? Procedure: Redo Right CRANIOTOMY HEMATOMA EVACUATION SUBDURAL;  Surgeon: [name], MD;  Location: [place];  Service: Neurosurgery;  Laterality: Right;  ? INCISION AND DRAINAGE  12/2012  ? sebacebus cyst, infected  ? MANDIBLE FRACTURE SURGERY    ? [name] N/A 07/16/2011  ? Procedure: [name];  Surgeon: Angelia Mould, MD;  Location: [facility] CATH LAB;  Service: Cardiovascular;  Laterality: N/A;  ? ? ? ? ? ?Home Medications   ? ?Prior to Admission medications   ?Medication Sig Start Date End Date Taking? Authorizing Provider  ?metroNIDAZOLE (FLAGYL) 500 MG tablet Take 1 tablet (500 mg total) by mouth 2 (two) times daily for 7 days. 05/02/21 05/09/21 Yes Barrett Henle, MD  ?  Accu-Chek Softclix Lancets lancets Use as directed up to 4 times daily. 10/26/20   Angiulli, Lavon Paganini, PA-C  ?acetaminophen (TYLENOL) 325 MG tablet Take 2 tablets (650 mg total) by mouth every 4 (four) hours as needed for mild pain (temp > 100.5). 10/26/20   Angiulli, Lavon Paganini, PA-C  ?allopurinol (ZYLOPRIM) 100 MG tablet Take by mouth. 11/18/20   [provider]  ?amLODipine (NORVASC) 5 MG tablet Take 1 tablet (5 mg total) by mouth daily. 01/26/21 01/26/22  Delene Ruffini, MD  ?atorvastatin (LIPITOR) 40 MG tablet Take 1 tablet (40 mg total) by mouth daily. 10/26/20 10/26/21  Angiulli, Lavon Paganini, PA-C  ?Blood Glucose Monitoring Suppl (ACCU-CHEK GUIDE) w/Device KIT  Use as directed 10/26/20   Meredith Staggers, MD  ?Blood Glucose Monitoring Suppl (ACCU-CHEK GUIDE) w/Device KIT Use as directed 10/27/20   Cathlyn Parsons, PA-C  ?calcitRIOL (ROCALTROL) 0.5 MCG capsule Take 1 capsule (0.5 mcg total) by mouth every Monday, Wednesday, and Friday with hemodialysis. 10/26/20   Angiulli, Lavon Paganini, PA-C  ?colchicine 0.6 MG tablet Take by mouth. 12/02/13   [provider]  ?Darbepoetin Alfa (ARANESP) 60 MCG/0.3ML SOSY injection Inject 0.3 mLs (60 mcg total) into the vein every Monday with hemodialysis. 10/17/20   Orvis Brill, MD  ?diphenhydrAMINE-APAP, sleep, (DIPHENHYDRAMINE-APAP PO) Take by mouth. 12/30/20   [provider]  ?glucose blood test strip Use as directed 10/26/20   Meredith Staggers, MD  ?HYDROcodone-acetaminophen (NORCO/VICODIN) 5-325 MG tablet Take 1 tablet by mouth every 4 (four) hours as needed for moderate pain. 10/26/20   Angiulli, Lavon Paganini, PA-C  ?iron sucrose in sodium chloride 0.9 % 100 mL Iron Sucrose (Venofer) 12/21/20 12/20/21  [provider]  ?labetalol (NORMODYNE) 300 MG tablet TAKE 1 TABLET(300 MG) BY MOUTH TWICE DAILY 04/06/21   Delene Ruffini, MD  ?LOPERAMIDE HCL PO Take by mouth. 12/28/20 01/01/22  [provider]  ?Methoxy PEG-Epoetin Beta (MIRCERA IJ) Mircera 11/02/20 11/01/21  [provider]  ?mycophenolate (MYFORTIC) 180 MG EC tablet Take 360 mg by mouth 2 (two) times daily. 07/03/17   [provider]  ?mycophenolate (MYFORTIC) 360 MG TBEC EC tablet Take by mouth. 11/18/20   [provider]  ?pantoprazole (PROTONIX) 40 MG tablet Take 1 tablet (40 mg total) by mouth at bedtime. 10/26/20   Angiulli, Lavon Paganini, PA-C  ?senna (SENOKOT) 8.6 MG tablet Take by mouth. 10/17/20   [provider]  ?sevelamer carbonate (RENVELA) 800 MG tablet Take by mouth. 12/07/20   [provider]  ?sucroferric oxyhydroxide (VELPHORO) 500 MG chewable tablet Chew by mouth. 06/14/16    [provider]  ?tacrolimus (PROGRAF) 1 MG capsule tacrolimus capsule 1 mg 10/17/20   [provider]  ?Tuberculin PPD (TUBERSOL ID) Inject into the skin. 10/28/20   [provider]  ?VITAMIN D, CHOLECALCIFEROL, PO Take by mouth. ?Patient not taking: Reported on 03/14/2021 12/28/20 12/27/21  [provider]  ? ? ?Family History ?Family History  ?Problem Relation Age of Onset  ? Diabetes Father   ? Heart disease Father   ? Hypertension Father   ? Other Father   ?     amputation  ? Healthy Mother   ? Diabetes Sister   ? Heart disease Sister   ? Colon cancer Neg Hx   ? Stomach cancer Neg Hx   ? Rectal cancer Neg Hx   ? Esophageal cancer Neg Hx   ? Liver cancer Neg Hx   ? Colon polyps Neg Hx   ? ? ?  Social History ?Social History  ? ?Tobacco Use  ? Smoking status: Former  ?  Packs/day: 0.10  ?  Years: 30.00  ?  Pack years: 3.00  ?  Types: Cigarettes  ?  Start date: 02/15/2016  ? Smokeless tobacco: Never  ? Tobacco comments:  ?  Quit in Feb.  ?Vaping Use  ? Vaping Use: Never used  ?Substance Use Topics  ? Alcohol use: Yes  ?  Comment: occasional  ? Drug use: No  ?  Frequency: 4.0 times per week  ? ? ? ?Allergies   ?Patient has no known allergies. ? ? ?Review of Systems ?Review of Systems ? ? ?Physical Exam ?Triage Vital Signs ?ED Triage Vitals  ?Enc Vitals Group  ?   BP 05/02/21 1104 (!) 153/77  ?   Pulse Rate 05/02/21 1104 68  ?   Resp 05/02/21 1104 18  ?   Temp 05/02/21 1104 97.9 ?F (36.6 ?C)  ?   Temp Source 05/02/21 1104 Oral  ?   SpO2 05/02/21 1104 96 %  ?   Weight 05/02/21 1103 218 lb 14.7 oz (99.3 kg)  ?   Height 05/02/21 1103 _0  (1.854 m)  ?   Head Circumference --   ?   Peak Flow --   ?   Pain Score 05/02/21 1103 0  ?   Pain Loc --   ?   Pain Edu? --   ?   Excl. in [place]? --   ? ?No data found. ? ?Updated Vital Signs ?BP (!) 153/77 (BP Location: Right Arm)   Pulse 68   Temp 97.9 ?F (36.6 ?C) (Oral)   Resp 18   Ht _1  (1.854 m)   Wt 99.3 kg   SpO2 96%   BMI 28.88 kg/m?   ? ?Visual Acuity ?Right Eye Distance:   ?Left Eye Distance:   ?Bilateral Distance:   ? ?Right Eye Near:   ?Left Eye Near:    ?Bilateral Near:    ? ?Physical Exam ?Vitals reviewed.  ?Constitutional:   ?   General: He is not i

## 2021-05-03 LAB — CYTOLOGY, (ORAL, ANAL, URETHRAL) ANCILLARY ONLY
Chlamydia: NEGATIVE
Comment: NEGATIVE
Comment: NEGATIVE
Comment: NORMAL
Neisseria Gonorrhea: NEGATIVE
Trichomonas: POSITIVE — AB

## 2021-05-16 ENCOUNTER — Encounter (HOSPITAL_COMMUNITY): Payer: Self-pay | Admitting: Emergency Medicine

## 2021-05-16 ENCOUNTER — Ambulatory Visit (HOSPITAL_COMMUNITY)
Admission: EM | Admit: 2021-05-16 | Discharge: 2021-05-16 | Disposition: A | Discharge status: Home / Self Care | Payer: Medicaid Other | Attending: Emergency Medicine | Admitting: Emergency Medicine

## 2021-05-16 DIAGNOSIS — N4889 Other specified disorders of penis: Secondary | ICD-10-CM | POA: Diagnosis present

## 2021-05-16 MED ORDER — METRONIDAZOLE 500 MG PO TABS
2000.0000 mg | ORAL_TABLET | Freq: Once | ORAL | 0 refills | Status: AC
Start: 2021-05-16 — End: 2021-05-16

## 2021-05-16 NOTE — Discharge Instructions (Signed)
Today you are being treated for trichomoniasis prophylactically, take metronidazole pills at once, refrain from sexual activity for 7 days after medication, refrain from alcohol for at least 24 hours after medication use, do not take tablets on the day that you have dialysis ? ?Information for the local health department is on your paperwork, you may receive STD testing and treatment therefore free ? ? ?Labs pending 2-3 days, you will be contacted if positive for any sti and treatment will be sent to the pharmacy, you will have to return to the clinic if positive for gonorrhea to receive treatment  ? ?Please refrain from having sex until labs results, if positive please refrain from having sex until treatment complete and symptoms resolve  ? ?If positive for Chlamydia  gonorrhea or trichomoniasis please notify partner or partners so they may tested as well ? ?Moving forward, it is recommended you use some form of protection against the transmission of sti infections  such as condoms or dental dams with each sexual encounter  ? ?

## 2021-05-16 NOTE — ED Triage Notes (Signed)
Pt reports wants STD testing. Recently took pills but unsure if took it all away but still having some penile pains.  ?

## 2021-05-16 NOTE — ED Provider Notes (Signed)
[facility]    CSN: [id number] Arrival date & time: 05/16/21  1104      History   Chief Complaint Chief Complaint  Patient presents with   Exposure to STD    HPI Christopher Chapman. is a 66 y.o. male.   Patient endorses penile pain for 14 days.  Was recently treated for trichomoniasis but unsure if medication was effective.  Denies sexual activity since last visit.  Denies penile discharge, penile or testicle swelling, abdominal pain, flank pain, fever, chills, new rash or lesions, dysuria, urinary frequency or urgency.   Past Medical History:  Diagnosis Date   Anemia    Arthritis    CRF (chronic renal failure)    ESRD (end stage renal disease) (HCC)    Family history of anesthesia complication    [place] going to sleep.   Gout    Hx of cocaine abuse (HCC)    Hyperlipidemia    Hypertension    Hyperuricemia    MRSA (methicillin resistant staph aureus) culture positive 07/2011   Parathyroid disease (HCC)    Hx of , No meds at present   Tobacco abuse    Tuberculosis 1985   Treated with imeds for a years.    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   Immunosuppression (HCC) 09/24/2016   History of gout 05/14/2016   Carotid stenosis, bilateral 05/14/2016   Routine adult health maintenance 05/14/2016   Dyslipidemia 01/14/2013   History of cocaine use 01/14/2013   End stage renal disease (HCC) 06/27/2011   Essential hypertension 08/19/2006   HYPERPARATHYROIDISM, SECONDARY 08/19/2006   ALCOHOL ABUSE, HX OF 08/19/2006     Past Surgical History:  Procedure Laterality Date   ANGIOPLASTY  08/09/2011   Procedure: ANGIOPLASTY;  Surgeon: Chuck Hint, MD;  Location[facility];  Service: Vascular;  Laterality: Left;  Left Braciocephalic Fistula using Vascu-Guard Patch   APPENDECTOMY  1980   AV FISTULA PLACEMENT Left 2010   CRANIOTOMY Right 10/05/2020   Procedure: CRANIOTOMY HEMATOMA EVACUATION SUBDURAL;  Surgeon: Tia Alert, MD;  [facility];  Service: Neurosurgery;  Laterality: Right;   CRANIOTOMY Right 10/14/2020   Procedure: Redo Right CRANIOTOMY HEMATOMA EVACUATION SUBDURAL;  Surgeon: Tia Alert, MD;  Location: [facility] OR;  Service: Neurosurgery;  Laterality: Right;   INCISION AND DRAINAGE  12/2012   sebacebus cyst, infected   MANDIBLE FRACTURE SURGERY     [name] N/A 07/16/2011   Procedure: Betsey Amen;  Surgeon: Chuck Hint, MD;  Location: [facility] CATH LAB;  Service: Cardiovascular;  Laterality: N/A;       Home Medications    Prior to Admission medications   Medication Sig Start Date End Date Taking? Authorizing Provider  Accu-Chek Softclix Lancets lancets Use as directed up to 4 times daily. 10/26/20   Angiulli, Mcarthur Rossetti, PA-C  acetaminophen (TYLENOL) 325 MG tablet Take 2 tablets (650 mg total) by mouth every 4 (four) hours as needed for mild pain (temp > 100.5). 10/26/20   Mariam Dollar  J, PA-C  allopurinol (ZYLOPRIM) 100 MG tablet Take by mouth. 11/18/20   [provider]  amLODipine (NORVASC) 5 MG tablet Take 1 tablet (5 mg total) by mouth daily. 01/26/21 01/26/22  Adron Bene, MD  atorvastatin (LIPITOR) 40 MG tablet Take 1 tablet (40 mg total) by mouth daily. 10/26/20 10/26/21  Angiulli, Mcarthur Rossetti, PA-C  Blood Glucose Monitoring Suppl (ACCU-CHEK GUIDE) w/Device KIT Use as directed 10/26/20   Ranelle Oyster, MD  Blood Glucose Monitoring Suppl (ACCU-CHEK GUIDE) w/Device KIT Use as directed 10/27/20   Charlton Amor, PA-C  calcitRIOL (ROCALTROL) 0.5 MCG  capsule Take 1 capsule (0.5 mcg total) by mouth every Monday, Wednesday, and Friday with hemodialysis. 10/26/20   Angiulli, Mcarthur Rossetti, PA-C  colchicine 0.6 MG tablet Take by mouth. 12/02/13   [provider]  Darbepoetin Alfa (ARANESP) 60 MCG/0.3ML SOSY injection Inject 0.3 mLs (60 mcg total) into the vein every Monday with hemodialysis. 10/17/20   Andrey Campanile, MD  diphenhydrAMINE-APAP, sleep, (DIPHENHYDRAMINE-APAP PO) Take by mouth. 12/30/20   [provider]  glucose blood test strip Use as directed 10/26/20   Ranelle Oyster, MD  HYDROcodone-acetaminophen (NORCO/VICODIN) 5-325 MG tablet Take 1 tablet by mouth every 4 (four) hours as needed for moderate pain. 10/26/20   Angiulli, Mcarthur Rossetti, PA-C  iron sucrose in sodium chloride 0.9 % 100 mL Iron Sucrose (Venofer) 12/21/20 12/20/21  [provider]  labetalol (NORMODYNE) 300 MG tablet TAKE 1 TABLET(300 MG) BY MOUTH TWICE DAILY 04/06/21   Adron Bene, MD  LOPERAMIDE HCL PO Take by mouth. 12/28/20 01/01/22  [provider]  Methoxy PEG-Epoetin Beta (MIRCERA IJ) Mircera 11/02/20 11/01/21  [provider]  mycophenolate (MYFORTIC) 180 MG EC tablet Take 360 mg by mouth 2 (two) times daily. 07/03/17   [provider]  mycophenolate (MYFORTIC) 360 MG TBEC EC tablet Take by mouth. 11/18/20   [provider]  pantoprazole (PROTONIX) 40 MG tablet Take 1 tablet (40 mg total) by mouth at bedtime. 10/26/20   Angiulli, Mcarthur Rossetti, PA-C  senna (SENOKOT) 8.6 MG tablet Take by mouth. 10/17/20   [provider]  sevelamer carbonate (RENVELA) 800 MG tablet Take by mouth. 12/07/20   [provider]  sucroferric oxyhydroxide (VELPHORO) 500 MG chewable tablet Chew by mouth. 06/14/16   [provider]  tacrolimus (PROGRAF) 1 MG capsule tacrolimus capsule 1 mg 10/17/20   [provider]  Tuberculin PPD (TUBERSOL ID) Inject into the skin. 10/28/20   [provider]  VITAMIN D, CHOLECALCIFEROL, PO Take by mouth. Patient not taking: Reported on 03/14/2021 12/28/20 12/27/21  [provider]    Family History Family History  Problem Relation Age of Onset   Diabetes Father    Heart disease Father    Hypertension Father    Other Father        amputation   Healthy Mother    Diabetes Sister    Heart disease Sister    Colon cancer Neg Hx    Stomach cancer Neg Hx    Rectal cancer Neg Hx    Esophageal cancer Neg Hx    Liver cancer Neg Hx    Colon polyps Neg Hx     Social History Social History   Tobacco Use   Smoking status: Former    Packs/day: 0.10    Years: 30.00    Pack years: 3.00    Types: Cigarettes    Start date: 02/15/2016   Smokeless tobacco: Never  Tobacco comments:    Quit in Feb.  Vaping Use   Vaping Use: Never used  Substance Use Topics   Alcohol use: Yes    Comment: occasional   Drug use: No    Frequency: 4.0 times per week     Allergies   Patient has no known allergies.   Review of Systems Review of Systems  Constitutional: Negative.   HENT: Negative.    Respiratory: Negative.    Cardiovascular: Negative.   Gastrointestinal: Negative.   Genitourinary:  Positive for penile pain. Negative for decreased urine volume, difficulty urinating, dysuria, enuresis, flank pain, frequency, genital sores, hematuria, penile discharge, penile swelling, scrotal swelling, testicular pain and urgency.  Skin: Negative.     Physical Exam Triage Vital Signs ED Triage Vitals  Enc Vitals Group     BP 05/16/21 1205 (!) 153/86     Pulse Rate 05/16/21 1205 61     Resp 05/16/21 1205 17     Temp 05/16/21 1205 97.8 F (36.6 C)     Temp Source 05/16/21 1205 Oral     SpO2 05/16/21 1205 95 %     Weight --      Height --      Head Circumference --      Peak Flow --      Pain Score 05/16/21 1204 4     Pain Loc --      Pain Edu? --      Excl. in [place]? --    No data found.  Updated Vital Signs BP (!)  153/86 (BP Location: Right Arm)   Pulse 61   Temp 97.8 F (36.6 C) (Oral)   Resp 17   SpO2 95%   Visual Acuity Right Eye Distance:   Left Eye Distance:   Bilateral Distance:    Right Eye Near:   Left Eye Near:    Bilateral Near:     Physical Exam Constitutional:      Appearance: Normal appearance.  Eyes:     Extraocular Movements: Extraocular movements intact.  Pulmonary:     Effort: Pulmonary effort is normal.  Genitourinary:    Comments: Deferred, self collect penile swab  Neurological:     Mental Status: He is alert and oriented to person, place, and time. Mental status is at baseline.  Psychiatric:        Mood and Affect: Mood normal.        Behavior: Behavior normal.     UC Treatments / Results  Labs (all labs ordered are listed, but only abnormal results are displayed) Labs Reviewed  CYTOLOGY, (ORAL, ANAL, URETHRAL) ANCILLARY ONLY    EKG   Radiology No results found.  Procedures Procedures (including critical care time)  Medications Ordered in UC Medications - No data to display  Initial Impression / Assessment and Plan / UC Course  I have reviewed the triage vital signs and the nursing notes.  Pertinent labs & imaging results that were available during my care of the patient were reviewed by me and considered in my medical decision making (see chart for details).  Penile pain   We will prophylactically treat for trichomoniasis today, metronidazole 2000 mg once prescribed, discussed administration, advised abstinence for 7 days posttreatment, advised abstinence from alcohol for at least 24 hours posttreatment, advised patient to take medication on a day that he does not have dialysis, STI labs pending, advised abstinence until all treatment is complete, labs have resulted, advised condom use during all sexual encounters moving  forward, may follow-up as needed Final Clinical Impressions(s) / UC Diagnoses   Final diagnoses:  None   Discharge  Instructions   None    ED Prescriptions   None    PDMP not reviewed this encounter.   Valinda Hoar, [place] 05/16/21 [phone]

## 2021-05-17 LAB — CYTOLOGY, (ORAL, ANAL, URETHRAL) ANCILLARY ONLY
Chlamydia: NEGATIVE
Comment: NEGATIVE
Comment: NEGATIVE
Comment: NORMAL
Neisseria Gonorrhea: NEGATIVE
Trichomonas: NEGATIVE

## 2021-07-12 ENCOUNTER — Ambulatory Visit (HOSPITAL_COMMUNITY)
Admission: EM | Admit: 2021-07-12 | Discharge: 2021-07-12 | Disposition: A | Discharge status: Home / Self Care | Payer: Medicaid Other | Attending: Physician Assistant | Admitting: Physician Assistant

## 2021-07-12 ENCOUNTER — Encounter (HOSPITAL_COMMUNITY): Payer: Self-pay

## 2021-07-12 DIAGNOSIS — Z202 Contact with and (suspected) exposure to infections with a predominantly sexual mode of transmission: Secondary | ICD-10-CM | POA: Diagnosis present

## 2021-07-12 DIAGNOSIS — N4889 Other specified disorders of penis: Secondary | ICD-10-CM | POA: Diagnosis present

## 2021-07-12 MED ORDER — METRONIDAZOLE 500 MG PO TABS
ORAL_TABLET | ORAL | Status: AC
  Filled 2021-07-12: qty 4

## 2021-07-12 MED ORDER — METRONIDAZOLE 500 MG PO TABS
2000.0000 mg | ORAL_TABLET | Freq: Once | ORAL | Status: AC
  Administered 2021-07-12: 2000 mg via ORAL

## 2021-07-12 NOTE — Discharge Instructions (Addendum)
I am concerned that you have trichomonas.  We treated you for this during your visit today.  Please do not drink any alcohol for 3 days after this medication as will cause you to vomit.  It is important that you use a condom with each sexual encounter.  Your partner needs to be tested and treated as you will continue to get this infection over and over again until you both have been treated.  You should not have sex for minimum of 1 week after completing treatment.  If you have any worsening symptoms please return for reevaluation.

## 2021-07-12 NOTE — ED Provider Notes (Signed)
[place]    CSN: [id number] Arrival date & time: 07/12/21  1203      History   Chief Complaint Chief Complaint  Patient presents with   SEXUALLY TRANSMITTED DISEASE    HPI Christopher Chapman. is a 66 y.o. male.   Patient presents today with a several day history of intermittent penile irritation.  He reports that sometimes he will get an uncomfortable sensation in his penis with this generally resolves.  This is not ongoing and he denies any penile discharge, testicular pain, scrotal swelling, dysuria, abdominal pain, fever, nausea, vomiting.  He has a history of recurrent trichomonas.  Reports that he is extracted with the same partner and she has not been treated.  He typically uses condoms but does report that recently he had an unprotected encounter and believes that is when he was exposed.  He was last tested and treated in May 2023.  He is requesting treatment today.    Past Medical History:  Diagnosis Date   Anemia    Arthritis    CRF (chronic renal failure)    ESRD (end stage renal disease) ([place])    Family history of anesthesia complication    [place] going to sleep.   Gout    Hx of cocaine abuse ([place])    Hyperlipidemia    Hypertension    Hyperuricemia    MRSA (methicillin resistant staph aureus) culture positive 07/2011   Parathyroid disease ([place])    Hx of , No meds at present   Tobacco abuse    Tuberculosis 1985   Treated with imeds for a years.    Patient Active Problem List   Diagnosis Date Noted   Prediabetes    Acute on chronic anemia    ESRD on dialysis [facility])    Traumatic subdural hematoma ([place]) 10/17/2020   Subdural hematoma 10/05/2020   S/P craniotomy 10/05/2020   Altered mental status    ABLA (acute blood loss anemia)    Stage 4 chronic kidney disease ([place])    Type II diabetes mellitus ([place]) 03/15/2020   CMV (cytomegalovirus infection) ([place]) 01/29/2017   Kidney replaced by transplant 10/04/2016   Immunosuppression  ([place]) 09/24/2016   History of gout 05/14/2016   Carotid stenosis, bilateral 05/14/2016   Routine adult health maintenance 05/14/2016   Dyslipidemia 01/14/2013   History of cocaine use 01/14/2013   End stage renal disease ([place]) 06/27/2011   Essential hypertension 08/19/2006   HYPERPARATHYROIDISM, SECONDARY 08/19/2006   ALCOHOL ABUSE, HX OF 08/19/2006    Past Surgical History:  Procedure Laterality Date   ANGIOPLASTY  08/09/2011   Procedure: ANGIOPLASTY;  Surgeon: Angelia Mould, MD;  Location[facility];  Service: Vascular;  Laterality: Left;  Left Braciocephalic Fistula using Vascu-Guard Patch   [place] Left 2010   CRANIOTOMY Right 10/05/2020   Procedure: CRANIOTOMY HEMATOMA EVACUATION SUBDURAL;  Surgeon: [name], MD;  Location: [place];  Service: Neurosurgery;  Laterality: Right;   CRANIOTOMY Right 10/14/2020   Procedure: Redo Right CRANIOTOMY HEMATOMA EVACUATION SUBDURAL;  Surgeon: [name], MD;  Location: [place];  Service: Neurosurgery;  Laterality: Right;   INCISION AND DRAINAGE  12/2012   sebacebus cyst, infected   MANDIBLE FRACTURE SURGERY     [name] N/A 07/16/2011   Procedure: Earney Mallet;  Surgeon: Angelia Mould, MD;  Location: [facility] CATH LAB;  Service: Cardiovascular;  Laterality: N/A;       Home Medications  Prior to Admission medications   Medication Sig Start Date End Date Taking? Authorizing Provider  Accu-Chek Softclix Lancets lancets Use as directed up to 4 times daily. 10/26/20   Angiulli, Lavon Paganini, PA-C  acetaminophen (TYLENOL) 325 MG tablet Take 2 tablets (650 mg total) by mouth every 4 (four) hours as needed for mild pain (temp > 100.5). 10/26/20   Angiulli, Lavon Paganini, PA-C  allopurinol (ZYLOPRIM) 100 MG tablet Take by mouth. 11/18/20   [provider]  amLODipine (NORVASC) 5 MG tablet Take 1 tablet (5 mg total) by mouth daily. 01/26/21 01/26/22  Delene Ruffini, MD  atorvastatin (LIPITOR) 40  MG tablet Take 1 tablet (40 mg total) by mouth daily. 10/26/20 10/26/21  Angiulli, Lavon Paganini, PA-C  Blood Glucose Monitoring Suppl (ACCU-CHEK GUIDE) w/Device KIT Use as directed 10/26/20   Meredith Staggers, MD  Blood Glucose Monitoring Suppl (ACCU-CHEK GUIDE) w/Device KIT Use as directed 10/27/20   Cathlyn Parsons, PA-C  calcitRIOL (ROCALTROL) 0.5 MCG capsule Take 1 capsule (0.5 mcg total) by mouth every Monday, Wednesday, and Friday with hemodialysis. 10/26/20   Angiulli, Lavon Paganini, PA-C  colchicine 0.6 MG tablet Take by mouth. 12/02/13   [provider]  Darbepoetin Alfa (ARANESP) 60 MCG/0.3ML SOSY injection Inject 0.3 mLs (60 mcg total) into the vein every Monday with hemodialysis. 10/17/20   Orvis Brill, MD  diphenhydrAMINE-APAP, sleep, (DIPHENHYDRAMINE-APAP PO) Take by mouth. 12/30/20   [provider]  glucose blood test strip Use as directed 10/26/20   Meredith Staggers, MD  HYDROcodone-acetaminophen (NORCO/VICODIN) 5-325 MG tablet Take 1 tablet by mouth every 4 (four) hours as needed for moderate pain. 10/26/20   Angiulli, Lavon Paganini, PA-C  iron sucrose in sodium chloride 0.9 % 100 mL Iron Sucrose (Venofer) 12/21/20 12/20/21  [provider]  labetalol (NORMODYNE) 300 MG tablet TAKE 1 TABLET(300 MG) BY MOUTH TWICE DAILY 04/06/21   Delene Ruffini, MD  LOPERAMIDE HCL PO Take by mouth. 12/28/20 01/01/22  [provider]  Methoxy PEG-Epoetin Beta (MIRCERA IJ) Mircera 11/02/20 11/01/21  [provider]  mycophenolate (MYFORTIC) 180 MG EC tablet Take 360 mg by mouth 2 (two) times daily. 07/03/17   [provider]  mycophenolate (MYFORTIC) 360 MG TBEC EC tablet Take by mouth. 11/18/20   [provider]  pantoprazole (PROTONIX) 40 MG tablet Take 1 tablet (40 mg total) by mouth at bedtime. 10/26/20   Angiulli, Lavon Paganini, PA-C  senna (SENOKOT) 8.6 MG tablet Take by mouth. 10/17/20   [provider]  sevelamer carbonate  (RENVELA) 800 MG tablet Take by mouth. 12/07/20   [provider]  sucroferric oxyhydroxide (VELPHORO) 500 MG chewable tablet Chew by mouth. 06/14/16   [provider]  tacrolimus (PROGRAF) 1 MG capsule tacrolimus capsule 1 mg 10/17/20   [provider]  Tuberculin PPD (TUBERSOL ID) Inject into the skin. 10/28/20   [provider]  VITAMIN D, CHOLECALCIFEROL, PO Take by mouth. Patient not taking: Reported on 03/14/2021 12/28/20 12/27/21  [provider]    Family History Family History  Problem Relation Age of Onset   Diabetes Father    Heart disease Father    Hypertension Father    Other Father        amputation   Healthy Mother    Diabetes Sister    Heart disease Sister    Colon cancer Neg Hx    Stomach cancer Neg Hx    Rectal cancer Neg Hx    Esophageal cancer  Neg Hx    Liver cancer Neg Hx    Colon polyps Neg Hx     Social History Social History   Tobacco Use   Smoking status: Former    Packs/day: 0.10    Years: 30.00    Total pack years: 3.00    Types: Cigarettes    Start date: 02/15/2016   Smokeless tobacco: Never   Tobacco comments:    Quit in Feb.  Vaping Use   Vaping Use: Never used  Substance Use Topics   Alcohol use: Yes    Comment: occasional   Drug use: No    Frequency: 4.0 times per week     Allergies   Patient has no known allergies.   Review of Systems Review of Systems  Constitutional:  Negative for activity change, appetite change, fatigue and fever.  Gastrointestinal:  Negative for abdominal pain, diarrhea, nausea and vomiting.  Genitourinary:  Positive for penile pain. Negative for dysuria, frequency, penile discharge, penile swelling, scrotal swelling and urgency.     Physical Exam Triage Vital Signs ED Triage Vitals  Enc Vitals Group     BP 07/12/21 1259 114/70     Pulse Rate 07/12/21 1259 77     Resp 07/12/21 1259 18     Temp 07/12/21 1259 99 F (37.2 C)     Temp Source 07/12/21 1259  Oral     SpO2 07/12/21 1259 96 %     Weight --      Height --      Head Circumference --      Peak Flow --      Pain Score 07/12/21 1300 0     Pain Loc --      Pain Edu? --      Excl. in [place]? --    No data found.  Updated Vital Signs BP 114/70 (BP Location: Left Arm)   Pulse 77   Temp 99 F (37.2 C) (Oral)   Resp 18   SpO2 96%   Visual Acuity Right Eye Distance:   Left Eye Distance:   Bilateral Distance:    Right Eye Near:   Left Eye Near:    Bilateral Near:     Physical Exam Vitals reviewed.  Constitutional:      General: He is awake.     Appearance: Normal appearance. He is well-developed. He is not ill-appearing.     Comments: Very pleasant male appears stated age in no acute distress sitting comfortably in exam room  HENT:     Head: Normocephalic and atraumatic.  Cardiovascular:     Rate and Rhythm: Normal rate and regular rhythm.     Heart sounds: Normal heart sounds, S1 normal and S2 normal. No murmur heard. Pulmonary:     Effort: Pulmonary effort is normal.     Breath sounds: Normal breath sounds. No stridor. No wheezing, rhonchi or rales.     Comments: Clear to auscultation bilaterally Abdominal:     General: Bowel sounds are normal.     Palpations: Abdomen is soft.     Tenderness: There is no abdominal tenderness.     Comments: Benign abdominal exam  Genitourinary:    Comments: Exam deferred Neurological:     Mental Status: He is alert.  Psychiatric:        Behavior: Behavior is cooperative.      UC Treatments / Results  Labs (all labs ordered are listed, but only abnormal results are displayed) Labs Reviewed  CYTOLOGY, (ORAL, ANAL,  URETHRAL) ANCILLARY ONLY    EKG   Radiology No results found.  Procedures Procedures (including critical care time)  Medications Ordered in UC Medications  metroNIDAZOLE (FLAGYL) tablet 2,000 mg (has no administration in time range)    Initial Impression / Assessment and Plan / UC Course  I have  reviewed the triage vital signs and the nursing notes.  Pertinent labs & imaging results that were available during my care of the patient were reviewed by me and considered in my medical decision making (see chart for details).     STI swab collected today-results pending.  Suspected trichomonas exposure has led to recurrent infection.  Will empirically treat with metronidazole 2000 mg once.  Discussed that he should avoid alcohol for several days after course of medication due to Antabuse side effects.  Discussed that he is to abstain from sexual activity for minimum of 1 week.  Discussed the importance of having partner tested and treated as well as he will continue to get this infection if he is regularly reexposed.  We also discussed the importance of safe sex practices and he was provided STI education back with condoms during visit.  Discussed that if he develops any additional symptoms including penile discharge, pelvic pain, abdominal pain, fever, nausea, vomiting he needs to be seen immediately.  Strict return precautions given.    Final Clinical Impressions(s) / UC Diagnoses   Final diagnoses:  Penile irritation  Exposure to trichomonas     Discharge Instructions      I am concerned that you have trichomonas.  We treated you for this during your visit today.  Please do not drink any alcohol for 3 days after this medication as will cause you to vomit.  It is important that you use a condom with each sexual encounter.  Your partner needs to be tested and treated as you will continue to get this infection over and over again until you both have been treated.  You should not have sex for minimum of 1 week after completing treatment.  If you have any worsening symptoms please return for reevaluation.     ED Prescriptions   None    PDMP not reviewed this encounter.   Terrilee Croak, PA-C 07/12/21 1316

## 2021-07-12 NOTE — ED Triage Notes (Signed)
Pt requesting STD testing. States has pain off and on to penis for over a week. States has had unprotected intercourse. Denies discharge or urinary sx's.

## 2021-07-13 LAB — CYTOLOGY, (ORAL, ANAL, URETHRAL) ANCILLARY ONLY
Chlamydia: NEGATIVE
Comment: NEGATIVE
Comment: NEGATIVE
Comment: NORMAL
Neisseria Gonorrhea: NEGATIVE
Trichomonas: NEGATIVE

## 2021-08-08 DEATH — deceased
